# Patient Record
Sex: Female | Born: 1955 | Race: Black or African American | Hispanic: No | Marital: Married | Smoking: Former smoker
Health system: Southern US, Community
[De-identification: ages and names within clinical notes are randomized; demographics above are authoritative.]

## PROBLEM LIST (undated history)

## (undated) DIAGNOSIS — I4891 Unspecified atrial fibrillation: Secondary | ICD-10-CM

## (undated) DIAGNOSIS — R519 Headache, unspecified: Secondary | ICD-10-CM

## (undated) DIAGNOSIS — I639 Cerebral infarction, unspecified: Secondary | ICD-10-CM

## (undated) DIAGNOSIS — K5790 Diverticulosis of intestine, part unspecified, without perforation or abscess without bleeding: Secondary | ICD-10-CM

## (undated) DIAGNOSIS — K219 Gastro-esophageal reflux disease without esophagitis: Secondary | ICD-10-CM

## (undated) DIAGNOSIS — M1990 Unspecified osteoarthritis, unspecified site: Secondary | ICD-10-CM

## (undated) DIAGNOSIS — R42 Dizziness and giddiness: Secondary | ICD-10-CM

## (undated) DIAGNOSIS — E119 Type 2 diabetes mellitus without complications: Secondary | ICD-10-CM

## (undated) DIAGNOSIS — R011 Cardiac murmur, unspecified: Secondary | ICD-10-CM

## (undated) DIAGNOSIS — I700 Atherosclerosis of aorta: Secondary | ICD-10-CM

## (undated) DIAGNOSIS — I429 Cardiomyopathy, unspecified: Secondary | ICD-10-CM

## (undated) DIAGNOSIS — I509 Heart failure, unspecified: Secondary | ICD-10-CM

## (undated) DIAGNOSIS — R0600 Dyspnea, unspecified: Secondary | ICD-10-CM

## (undated) DIAGNOSIS — D649 Anemia, unspecified: Secondary | ICD-10-CM

## (undated) DIAGNOSIS — K279 Peptic ulcer, site unspecified, unspecified as acute or chronic, without hemorrhage or perforation: Secondary | ICD-10-CM

## (undated) DIAGNOSIS — K648 Other hemorrhoids: Secondary | ICD-10-CM

## (undated) DIAGNOSIS — I499 Cardiac arrhythmia, unspecified: Secondary | ICD-10-CM

## (undated) DIAGNOSIS — IMO0002 Reserved for concepts with insufficient information to code with codable children: Secondary | ICD-10-CM

## (undated) DIAGNOSIS — I10 Essential (primary) hypertension: Secondary | ICD-10-CM

## (undated) DIAGNOSIS — M329 Systemic lupus erythematosus, unspecified: Secondary | ICD-10-CM

## (undated) HISTORY — DX: Atherosclerosis of aorta: I70.0

## (undated) HISTORY — PX: COLONOSCOPY: SHX174

## (undated) HISTORY — DX: Dizziness and giddiness: R42

## (undated) HISTORY — DX: Peptic ulcer, site unspecified, unspecified as acute or chronic, without hemorrhage or perforation: K27.9

## (undated) HISTORY — DX: Diverticulosis of intestine, part unspecified, without perforation or abscess without bleeding: K57.90

## (undated) HISTORY — DX: Systemic lupus erythematosus, unspecified: M32.9

## (undated) HISTORY — PX: BACK SURGERY: SHX140

## (undated) HISTORY — PX: MENISECTOMY: SHX5181

## (undated) HISTORY — PX: UPPER GASTROINTESTINAL ENDOSCOPY: SHX188

## (undated) HISTORY — DX: Cardiomyopathy, unspecified: I42.9

## (undated) HISTORY — DX: Other hemorrhoids: K64.8

## (undated) HISTORY — PX: KNEE CARTILAGE SURGERY: SHX688

## (undated) HISTORY — DX: Reserved for concepts with insufficient information to code with codable children: IMO0002

## (undated) HISTORY — DX: Cerebral infarction, unspecified: I63.9

## (undated) HISTORY — PX: NECK SURGERY: SHX720

## (undated) HISTORY — PX: CARPAL TUNNEL RELEASE: SHX101

---

## 2010-07-21 LAB — HM COLONOSCOPY

## 2012-07-21 LAB — HM PAP SMEAR: HM PAP: NORMAL

## 2016-02-28 ENCOUNTER — Emergency Department (HOSPITAL_BASED_OUTPATIENT_CLINIC_OR_DEPARTMENT_OTHER): Payer: PPO

## 2016-02-28 ENCOUNTER — Encounter (HOSPITAL_BASED_OUTPATIENT_CLINIC_OR_DEPARTMENT_OTHER): Payer: Self-pay | Admitting: *Deleted

## 2016-02-28 ENCOUNTER — Emergency Department (HOSPITAL_BASED_OUTPATIENT_CLINIC_OR_DEPARTMENT_OTHER)
Admission: EM | Admit: 2016-02-28 | Discharge: 2016-02-28 | Disposition: A | Discharge status: Home / Self Care | Payer: PPO | Attending: Emergency Medicine | Admitting: Emergency Medicine

## 2016-02-28 DIAGNOSIS — Z87891 Personal history of nicotine dependence: Secondary | ICD-10-CM | POA: Diagnosis not present

## 2016-02-28 DIAGNOSIS — I10 Essential (primary) hypertension: Secondary | ICD-10-CM | POA: Diagnosis not present

## 2016-02-28 DIAGNOSIS — R51 Headache: Secondary | ICD-10-CM | POA: Diagnosis not present

## 2016-02-28 DIAGNOSIS — R079 Chest pain, unspecified: Secondary | ICD-10-CM | POA: Diagnosis not present

## 2016-02-28 DIAGNOSIS — H8111 Benign paroxysmal vertigo, right ear: Secondary | ICD-10-CM

## 2016-02-28 DIAGNOSIS — Z79899 Other long term (current) drug therapy: Secondary | ICD-10-CM | POA: Diagnosis not present

## 2016-02-28 DIAGNOSIS — R05 Cough: Secondary | ICD-10-CM | POA: Diagnosis not present

## 2016-02-28 DIAGNOSIS — H8110 Benign paroxysmal vertigo, unspecified ear: Secondary | ICD-10-CM | POA: Insufficient documentation

## 2016-02-28 DIAGNOSIS — Z7982 Long term (current) use of aspirin: Secondary | ICD-10-CM | POA: Diagnosis not present

## 2016-02-28 HISTORY — DX: Essential (primary) hypertension: I10

## 2016-02-28 LAB — CBC WITH DIFFERENTIAL/PLATELET
BASOS PCT: 0 %
Basophils Absolute: 0 10*3/uL (ref 0.0–0.1)
EOS ABS: 0.1 10*3/uL (ref 0.0–0.7)
EOS PCT: 1 %
HCT: 39.4 % (ref 36.0–46.0)
HEMOGLOBIN: 13.6 g/dL (ref 12.0–15.0)
LYMPHS ABS: 2.4 10*3/uL (ref 0.7–4.0)
Lymphocytes Relative: 24 %
MCH: 29.6 pg (ref 26.0–34.0)
MCHC: 34.5 g/dL (ref 30.0–36.0)
MCV: 85.7 fL (ref 78.0–100.0)
MONOS PCT: 8 %
Monocytes Absolute: 0.9 10*3/uL (ref 0.1–1.0)
NEUTROS PCT: 67 %
Neutro Abs: 6.7 10*3/uL (ref 1.7–7.7)
PLATELETS: 280 10*3/uL (ref 150–400)
RBC: 4.6 MIL/uL (ref 3.87–5.11)
RDW: 13.7 % (ref 11.5–15.5)
WBC: 10.1 10*3/uL (ref 4.0–10.5)

## 2016-02-28 LAB — BASIC METABOLIC PANEL
ANION GAP: 9 (ref 5–15)
BUN: 14 mg/dL (ref 6–20)
CALCIUM: 8.9 mg/dL (ref 8.9–10.3)
CHLORIDE: 101 mmol/L (ref 101–111)
CO2: 29 mmol/L (ref 22–32)
Creatinine, Ser: 0.85 mg/dL (ref 0.44–1.00)
GFR calc non Af Amer: >60 mL/min (ref >60)
Glucose, Bld: 134 mg/dL — ABNORMAL HIGH (ref 65–99)
Potassium: 3.2 mmol/L — ABNORMAL LOW (ref 3.5–5.1)
Sodium: 139 mmol/L (ref 135–145)

## 2016-02-28 LAB — TROPONIN I

## 2016-02-28 MED ORDER — MECLIZINE HCL 25 MG PO TABS
25.0000 mg | ORAL_TABLET | Freq: Once | ORAL | Status: AC
  Administered 2016-02-28: 25 mg via ORAL
  Filled 2016-02-28: qty 1

## 2016-02-28 MED ORDER — ACETAMINOPHEN 325 MG PO TABS
650.0000 mg | ORAL_TABLET | Freq: Once | ORAL | Status: AC
  Administered 2016-02-28: 650 mg via ORAL
  Filled 2016-02-28: qty 2

## 2016-02-28 MED ORDER — MECLIZINE HCL 25 MG PO TABS: 25.0000 mg | ORAL_TABLET | Freq: Three times a day (TID) | ORAL | 0 refills | Status: DC | PRN

## 2016-02-28 NOTE — ED Notes (Signed)
States she is having tingling in her both hands.

## 2016-02-28 NOTE — ED Provider Notes (Signed)
[facility] DEPT [facility] Provider Note   CSN: [id number] Arrival date & time: 02/28/16  1500  First Provider Contact:  None       History   Chief Complaint Chief Complaint  Patient presents with  . Chest Pain    HPI Amanda Saunders is a 60 y.o. female.  HPI Chest pain this am that was sharp and then she had numbness in her left arm. She went to [facility] and became disoriented. She is alert oriented on arrival.  Past Medical History:  Diagnosis Date  . Hypertension     There are no active problems to display for this patient.   Past Surgical History:  Procedure Laterality Date  . BACK SURGERY    . CARPAL TUNNEL RELEASE      OB History    No data available       Home Medications    Prior to Admission medications   Medication Sig Start Date End Date Taking? Authorizing Provider  Aspirin (ASPIR-81 PO) Take by mouth.   Yes Historical Provider, MD  HYDROCHLOROTHIAZIDE PO Take by mouth.   Yes Historical Provider, MD  meclizine (ANTIVERT) 25 MG tablet Take 1 tablet (25 mg total) by mouth 3 (three) times daily as needed for dizziness. 02/28/16   Leonard Schwartz, MD    Family History No family history on file.  Social History Social History  Substance Use Topics  . Smoking status: Former Research scientist (life sciences)  . Smokeless tobacco: Never Used  . Alcohol use No     Allergies   Review of patient's allergies indicates no known allergies.   Review of Systems Review of Systems All other systems reviewed and are negative  Physical Exam Updated Vital Signs BP 118/74 (BP Location: Right Arm)   Pulse 74   Temp 98.2 F (36.8 C) (Oral)   Resp 22   Ht 5\' 3"  (1.6 m)   Wt 194 lb (88 kg)   SpO2 98%   BMI 34.37 kg/m   Physical Exam  Physical Exam  Nursing note and vitals reviewed. Constitutional: She is oriented to person, place, and time. She appears well-developed and well-nourished. No distress.  HENT:  Head: Normocephalic and atraumatic.  Eyes: Pupils are equal,  round, and reactive to light.  Neck: Normal range of motion.  Cardiovascular: Normal rate and intact distal pulses.   Pulmonary/Chest: No respiratory distress.  Abdominal: Normal appearance. She exhibits no distension.  Musculoskeletal: Normal range of motion.  Neurological: She is alert and oriented to person, place, and time. No cranial nerve deficit.  Patient has reproducible vertigo when turning her head to the right.  Some nystagmus that quickly rose way.  She has no motor deficit.  No sensory deficit.   Skin: Skin is warm and dry. No rash noted.  Psychiatric: She has a normal mood and affect. Her behavior is normal.    ED Treatments / Results  Labs (all labs ordered are listed, but only abnormal results are displayed) Labs Reviewed  BASIC METABOLIC PANEL - Abnormal; Notable for the following:       Result Value   Potassium 3.2 (*)    Glucose, Bld 134 (*)    All other components within normal limits  CBC WITH DIFFERENTIAL/PLATELET  TROPONIN I    EKG  EKG Interpretation  Date/Time:  Thursday February 28 2016 15:09:31 EDT Ventricular Rate:  72 PR Interval:  170 QRS Duration: 86 QT Interval:  428 QTC Calculation: 468 R Axis:   28 Text Interpretation:  Normal sinus  rhythm Nonspecific T wave abnormality Prolonged QT Abnormal ECG No previous tracing Confirmed by Lynnea Vandervoort  MD, Kairi Tufo ([id number]) on 02/28/2016 3:27:00 PM       Radiology Dg Chest 2 View  Result Date: 02/28/2016 CLINICAL DATA:  Chest pain, dizziness, cough EXAM: CHEST  2 VIEW COMPARISON:  None. FINDINGS: The heart size and mediastinal contours are within normal limits. Both lungs are clear. The visualized skeletal structures are unremarkable. IMPRESSION: No active cardiopulmonary disease. Electronically Signed   By: Lahoma Crocker M.D.   On: 02/28/2016 15:52   Ct Head Wo Contrast  Result Date: 02/28/2016 CLINICAL DATA:  Headache for 2 weeks.  Dizziness today. EXAM: CT HEAD WITHOUT CONTRAST TECHNIQUE: Contiguous axial  images were obtained from the base of the skull through the vertex without intravenous contrast. COMPARISON:  None. FINDINGS: The ventricles normal in size and configuration. There are no parenchymal masses or mass effect. There is no evidence of an infarct. The patchy white matter hypoattenuation is noted consistent with moderate chronic microvascular ischemic change, advanced for age. There are no extra-axial masses or abnormal fluid collections. There is no intracranial hemorrhage. Visualized sinuses and mastoid air cells are clear. No skull lesion. IMPRESSION: 1. No acute intracranial abnormalities. 2. Moderate chronic microvascular ischemic change, advanced for age. No other abnormalities. Electronically Signed   By: Lajean Manes M.D.   On: 02/28/2016 16:44    Procedures Procedures (including critical care time)  Medications Ordered in ED Medications  acetaminophen (TYLENOL) tablet 650 mg (650 mg Oral Given 02/28/16 1655)  meclizine (ANTIVERT) tablet 25 mg (25 mg Oral Given 02/28/16 1729)     Initial Impression / Assessment and Plan / ED Course  I have reviewed the triage vital signs and the nursing notes.  Pertinent labs & imaging results that were available during my care of the patient were reviewed by me and considered in my medical decision making (see chart for details).  Clinical Course      Final Clinical Impressions(s) / ED Diagnoses   Final diagnoses:  BPV (benign positional vertigo), right    New Prescriptions Discharge Medication List as of 02/28/2016  5:26 PM    START taking these medications   Details  meclizine (ANTIVERT) 25 MG tablet Take 1 tablet (25 mg total) by mouth 3 (three) times daily as needed for dizziness., Starting Thu 02/28/2016, Print         Leonard Schwartz, MD 02/29/16 [phone]

## 2016-02-28 NOTE — ED Triage Notes (Signed)
Chest pain this am that was sharp and then she had numbness in her left arm. She went to [facility] and became disoriented. She is alert oriented on arrival.

## 2016-03-11 ENCOUNTER — Telehealth: Payer: Self-pay | Admitting: Behavioral Health

## 2016-03-11 ENCOUNTER — Encounter: Payer: Self-pay | Admitting: Behavioral Health

## 2016-03-11 NOTE — Telephone Encounter (Signed)
Pre-Visit Call completed with patient and chart updated.   Pre-Visit Info documented in Specialty Comments under [employer].    

## 2016-03-12 ENCOUNTER — Encounter: Payer: Self-pay | Admitting: Family Medicine

## 2016-03-12 ENCOUNTER — Ambulatory Visit (INDEPENDENT_AMBULATORY_CARE_PROVIDER_SITE_OTHER): Payer: PPO | Admitting: Family Medicine

## 2016-03-12 VITALS — BP 145/85 | HR 63 | Ht 63.25 in | Wt 203.4 lb

## 2016-03-12 DIAGNOSIS — Z23 Encounter for immunization: Secondary | ICD-10-CM | POA: Diagnosis not present

## 2016-03-12 DIAGNOSIS — Z1211 Encounter for screening for malignant neoplasm of colon: Secondary | ICD-10-CM

## 2016-03-12 DIAGNOSIS — E876 Hypokalemia: Secondary | ICD-10-CM

## 2016-03-12 DIAGNOSIS — R609 Edema, unspecified: Secondary | ICD-10-CM | POA: Diagnosis not present

## 2016-03-12 DIAGNOSIS — Z8673 Personal history of transient ischemic attack (TIA), and cerebral infarction without residual deficits: Secondary | ICD-10-CM | POA: Diagnosis not present

## 2016-03-12 DIAGNOSIS — R079 Chest pain, unspecified: Secondary | ICD-10-CM

## 2016-03-12 LAB — BASIC METABOLIC PANEL
BUN: 11 mg/dL (ref 6–23)
CHLORIDE: 103 meq/L (ref 96–112)
CO2: 32 meq/L (ref 19–32)
CREATININE: 0.79 mg/dL (ref 0.40–1.20)
Calcium: 9.3 mg/dL (ref 8.4–10.5)
GFR: 95.81 mL/min (ref >60.00)
Glucose, Bld: 110 mg/dL — ABNORMAL HIGH (ref 70–99)
POTASSIUM: 4.3 meq/L (ref 3.5–5.1)
Sodium: 139 mEq/L (ref 135–145)

## 2016-03-12 NOTE — Progress Notes (Signed)
[facility] at [facility] [address] [place], [place] [address] [phone]  Date:  03/12/2016   Name:  Amanda Saunders   DOB:  [birth year]   MRN:  [id number]  PCP:  Lamar Blinks, MD    Chief Complaint: Establish Care (Pt states that she has sob, headaches, dizziness, disorientation, chest pain and numbness, lower back pain and numbness in right leg. No cartilage in in knees)   History of Present Illness:  Amanda Saunders is a 60 y.o. very pleasant female patient who presents with the following:  Here today as a new patient- she was seen in the ER on 8/10 and dx with BPPV; she had presented with complaint of chest pain and left arm numbness, [facility] and became [facility]. She had a negative eval including troponin, CT head and was released to home She recently moved to [facility] from [place]. She is a reitred/ disabled former Gaffer.  States that she became disabled several years ago due to hand (?CTS) and back problems, then had a stroke just 2 years ago  Here today with complaint of several persistent sx as below, and also wishes to establish care  She had a CVA in 2015; states that she was seated in a car and suddenly felt her face "drop," she had CP and her arm was numb. They went to the ER and she was admitted, she was inpt for 5 days. She does not know more details about the stroke. She was released home with asa 81 mg.    She notes that she will sometimes feel disoriented since her CVA occurred.  She will also feel chest pains, head pains and her hands will go numb.    She did see cardiology in [place]- she was told that her heart looked ok. Cardiomyopathy is on her problem list but she is not sure about this.  Reports that she has noted intermittent CP for maybe 3 years, they may occur about 6x a year.  Tend to be worse when she is feeling worried about something.  No futrher CP since she was in the ER recently per  her report.  She reports that she has SOB "all the time, can be with walking or going up stairs." she has noted this for the last 4 years.    She used to smoke heavily but she quit in 2006.    She was also seeing a neurologist in [place]- states that her doctor wanted her to "take some sort of test," but she moved here before this could be done.  She is not on any medications per her neurologist.    She did have a low K level in the ER; we will recheck this today.   States that she is on HCTZ for swelling, not for HTN.  She takes 25 every other day; she has been on this for about 3 years.    Also notes that at her last colonoscopy in 2012, she had polyps and was supposed to have a repeat she thinks in 5 years   No recollection of receiving a tetanus shot as an adult She was given meclizine for dizziness at the ER and this did seem to help her some.   There are no active problems to display for this patient.   Past Medical History:  Diagnosis Date  . Cardiomyopathy ([place])   . Hypertension   . Stroke ([place])   . Vertigo  Past Surgical History:  Procedure Laterality Date  . BACK SURGERY    . CARPAL TUNNEL RELEASE      Social History  Substance Use Topics  . Smoking status: Former Research scientist (life sciences)  . Smokeless tobacco: Never Used  . Alcohol use No    Family History  Problem Relation Age of Onset  . Hypertension Mother   . Arthritis Mother   . Kidney disease Father   . Hypertension Father     Allergies  Allergen Reactions  . Codeine Nausea And Vomiting    Medication list has been reviewed and updated.  Current Outpatient Prescriptions on File Prior to Visit  Medication Sig Dispense Refill  . Aspirin (ASPIR-81 PO) Take by mouth.    Marland Kitchen HYDROCHLOROTHIAZIDE PO Take by mouth.    . meclizine (ANTIVERT) 25 MG tablet Take 1 tablet (25 mg total) by mouth 3 (three) times daily as needed for dizziness. 30 tablet 0   No current facility-administered medications on file prior to visit.      Review of Systems:  As per HPI- otherwise negative.   Physical Examination:  Orthostatic VS for the past 24 hrs:  BP- Lying Pulse- Lying BP- Sitting Pulse- Sitting BP- Standing at 0 minutes Pulse- Standing at 0 minutes  03/12/16 1049 139/84 65 145/85 63 151/90 71     Vitals:   03/12/16 1040  Weight: 203 lb 6.4 oz (92.3 kg)  Height: 5' 3.25" (1.607 m)   Body mass index is 35.75 kg/m. Ideal Body Weight: Weight in (lb) to have BMI = 25: 142  GEN: WDWN, NAD, Non-toxic, A & O x 3, overweight, looks well HEENT: Atraumatic, Normocephalic. Neck supple. No masses, No LAD.  Bilateral TM wnl, oropharynx normal.  PEERL,EOMI.   Ears and Nose: No external deformity. CV: RRR, No M/G/R. No JVD. No thrill. No extra heart sounds. PULM: CTA B, no wheezes, crackles, rhonchi. No retractions. No resp. distress. No accessory muscle use. ABD: S, NT, ND. No rebound. No HSM. EXTR: No c/c/e NEURO Normal gait. No apparent residual sx from her stroke. Normal strength, DTR of all extremities.  She does feel that her light touch sensation is diminished on the right  PSYCH: Normally interactive. Conversant. Not depressed or anxious appearing.  Calm demeanor.   EKG: compared with tracing from 8/10; she has a non- specific T wave change but no concerning ST elevation or depression. SR  Assessment and Plan: History of CVA (cerebrovascular accident) - Plan: Ambulatory referral to Neurology  Chest pain at rest - Plan: Ambulatory referral to Cardiology  Peripheral edema - Plan: Basic metabolic panel  Hypokalemia - Plan: Basic metabolic panel  Screening for colon cancer - Plan: Ambulatory referral to Gastroenterology  Immunization due - Plan: Tdap vaccine greater than or equal to 7yo IM  Here today to establish care.  She has several concerning symptoms, but all of these are longstanding Will refer to neurology and cardiology to establish care, and she is due for a colonoscopy Noted hypokalemia  recently and will recheck for her today tdap updated  It was nice to meet you today.  I'll get you set up to see neurology and also cardiology.  I will also check your potassium today as your level was a bit low the other day in the ER. I will also refer you to gastroenterology to update your colonoscopy   If you have any change or worsening of your symptoms let me know. Otherwise let's plan to meet again in 4-6 months  to see how you are doing  With labs will ask pt to contact her previous doctors and have them send me records at [place], [place]

## 2016-03-12 NOTE — Progress Notes (Signed)
Pre visit review using our clinic review tool, if applicable. No additional management support is needed unless otherwise documented below in the visit note. 

## 2016-03-12 NOTE — Patient Instructions (Addendum)
It was nice to meet you today.  I'll get you set up to see neurology and also cardiology.  I will also check your potassium today as your level was a bit low the other day in the ER. I will also refer you to gastroenterology to update your colonoscopy   If you have any change or worsening of your symptoms let me know. Otherwise let's plan to meet again in 4-6 months to see how you are doing

## 2016-03-25 ENCOUNTER — Telehealth: Payer: Self-pay | Admitting: Gastroenterology

## 2016-03-26 ENCOUNTER — Encounter: Payer: Self-pay | Admitting: Family Medicine

## 2016-03-26 ENCOUNTER — Emergency Department (HOSPITAL_COMMUNITY): Payer: PPO

## 2016-03-26 ENCOUNTER — Encounter (HOSPITAL_BASED_OUTPATIENT_CLINIC_OR_DEPARTMENT_OTHER): Payer: Self-pay | Admitting: *Deleted

## 2016-03-26 ENCOUNTER — Emergency Department (HOSPITAL_BASED_OUTPATIENT_CLINIC_OR_DEPARTMENT_OTHER): Payer: PPO

## 2016-03-26 ENCOUNTER — Emergency Department (HOSPITAL_BASED_OUTPATIENT_CLINIC_OR_DEPARTMENT_OTHER)
Admission: EM | Admit: 2016-03-26 | Discharge: 2016-03-26 | Disposition: A | Discharge status: Home / Self Care | Payer: PPO | Attending: Emergency Medicine | Admitting: Emergency Medicine

## 2016-03-26 ENCOUNTER — Ambulatory Visit (INDEPENDENT_AMBULATORY_CARE_PROVIDER_SITE_OTHER): Payer: PPO | Admitting: Family Medicine

## 2016-03-26 VITALS — BP 124/86 | HR 82 | Temp 99.0°F | Ht 63.0 in | Wt 200.6 lb

## 2016-03-26 DIAGNOSIS — Z8673 Personal history of transient ischemic attack (TIA), and cerebral infarction without residual deficits: Secondary | ICD-10-CM | POA: Insufficient documentation

## 2016-03-26 DIAGNOSIS — R200 Anesthesia of skin: Secondary | ICD-10-CM | POA: Insufficient documentation

## 2016-03-26 DIAGNOSIS — Z79899 Other long term (current) drug therapy: Secondary | ICD-10-CM | POA: Insufficient documentation

## 2016-03-26 DIAGNOSIS — Z87891 Personal history of nicotine dependence: Secondary | ICD-10-CM | POA: Insufficient documentation

## 2016-03-26 DIAGNOSIS — R0789 Other chest pain: Secondary | ICD-10-CM | POA: Diagnosis not present

## 2016-03-26 DIAGNOSIS — R51 Headache: Secondary | ICD-10-CM | POA: Diagnosis not present

## 2016-03-26 DIAGNOSIS — R079 Chest pain, unspecified: Secondary | ICD-10-CM

## 2016-03-26 DIAGNOSIS — R5383 Other fatigue: Secondary | ICD-10-CM | POA: Diagnosis not present

## 2016-03-26 DIAGNOSIS — I10 Essential (primary) hypertension: Secondary | ICD-10-CM | POA: Diagnosis not present

## 2016-03-26 LAB — CBC WITH DIFFERENTIAL/PLATELET
BASOS PCT: 0 %
Basophils Absolute: 0 10*3/uL (ref 0.0–0.1)
Eosinophils Absolute: 0.1 10*3/uL (ref 0.0–0.7)
Eosinophils Relative: 1 %
HEMATOCRIT: 39.9 % (ref 36.0–46.0)
HEMOGLOBIN: 13.6 g/dL (ref 12.0–15.0)
LYMPHS ABS: 2.6 10*3/uL (ref 0.7–4.0)
LYMPHS PCT: 25 %
MCH: 29.6 pg (ref 26.0–34.0)
MCHC: 34.1 g/dL (ref 30.0–36.0)
MCV: 86.7 fL (ref 78.0–100.0)
MONO ABS: 0.9 10*3/uL (ref 0.1–1.0)
MONOS PCT: 8 %
NEUTROS ABS: 6.6 10*3/uL (ref 1.7–7.7)
NEUTROS PCT: 66 %
Platelets: 292 10*3/uL (ref 150–400)
RBC: 4.6 MIL/uL (ref 3.87–5.11)
RDW: 14.1 % (ref 11.5–15.5)
WBC: 10.2 10*3/uL (ref 4.0–10.5)

## 2016-03-26 LAB — BASIC METABOLIC PANEL
ANION GAP: 8 (ref 5–15)
BUN: 14 mg/dL (ref 6–20)
CALCIUM: 8.9 mg/dL (ref 8.9–10.3)
CHLORIDE: 100 mmol/L — AB (ref 101–111)
CO2: 28 mmol/L (ref 22–32)
Creatinine, Ser: 0.78 mg/dL (ref 0.44–1.00)
GFR calc non Af Amer: >60 mL/min (ref >60)
GLUCOSE: 101 mg/dL — AB (ref 65–99)
POTASSIUM: 3.4 mmol/L — AB (ref 3.5–5.1)
Sodium: 136 mmol/L (ref 135–145)

## 2016-03-26 LAB — TROPONIN I: Troponin I: <0.03 ng/mL (ref <0.03)

## 2016-03-26 LAB — I-STAT TROPONIN, ED: TROPONIN I, POC: 0.01 ng/mL (ref 0.00–0.08)

## 2016-03-26 MED ORDER — MIDAZOLAM HCL 2 MG/2ML IJ SOLN
1.0000 mg | Freq: Once | INTRAMUSCULAR | Status: AC
  Administered 2016-03-26: 1 mg via INTRAVENOUS
  Filled 2016-03-26: qty 2

## 2016-03-26 MED ORDER — HYDROCODONE-ACETAMINOPHEN 5-325 MG PO TABS
2.0000 | ORAL_TABLET | Freq: Once | ORAL | Status: AC
  Administered 2016-03-26: 2 via ORAL
  Filled 2016-03-26: qty 2

## 2016-03-26 MED ORDER — KETOROLAC TROMETHAMINE 30 MG/ML IJ SOLN
30.0000 mg | Freq: Once | INTRAMUSCULAR | Status: AC
  Administered 2016-03-26: 30 mg via INTRAVENOUS
  Filled 2016-03-26: qty 1

## 2016-03-26 MED ORDER — LORAZEPAM 2 MG/ML IJ SOLN
1.0000 mg | Freq: Once | INTRAMUSCULAR | Status: AC
Start: 2016-03-26 — End: 2016-03-26
  Administered 2016-03-26: 1 mg via INTRAVENOUS
  Filled 2016-03-26: qty 1

## 2016-03-26 NOTE — ED Triage Notes (Signed)
Pt reports numbness to her left arm and leg x last night, with intermittent sudden, sharp, quick chest pains "like an electric shock" that come and go. ekg performed during triage, iv access obtained during triage.

## 2016-03-26 NOTE — ED Notes (Signed)
MD at bedside. 

## 2016-03-26 NOTE — Progress Notes (Signed)
Pre visit review using our clinic review tool, if applicable. No additional management support is needed unless otherwise documented below in the visit note. 

## 2016-03-26 NOTE — ED Notes (Signed)
Pt c/o HA and requesting pain meds, MD notified.

## 2016-03-26 NOTE — ED Provider Notes (Signed)
Patient transferred from [place] after she presented today with left arm and left leg numbness. For full details of presentation today, please see prior note from Dr. Stark Jock. Briefly, this is a 60 year old female with history of cardiomyopathy, CVA (no reported residual deficits), HTN, and vertigo who presented today with intermittent sharp chest pain and LUE/LLE numbness. Initial EKG and troponin not concerning for ACS. CT head was negative. However, due to concern for CVA, sent here for MRI.  Upon arrival here, patient is afebrile and with stable vital signs. Neuro exam normal except for numbness in left hand and part of forearm; the rest of her LUE and LLE numbness has resolved. Obtained second troponin, which remained negative. Patient is without current chest pain. Doubt ischemia with anginal symptoms leading to presentation today.   MRI obtained. Negative for CVA. On reevaluation, patient states she only has a slight residual numbness in the tip of her thumb. Otherwise, she is feeling well. She has follow-up with her PCP, cardiology, and neurology already scheduled. Discharged in stable condition with return precautions.  Case discussed with Dr. Venora Maples, who oversaw management of this patient.    Ivin Booty, MD 03/26/16 [id number]    Jola Schmidt, MD 03/27/16 [phone]

## 2016-03-26 NOTE — ED Notes (Signed)
[facility] has arrived to transport patient to [facility] ED.

## 2016-03-26 NOTE — ED Notes (Signed)
Family updated on plan of care for patient.

## 2016-03-26 NOTE — ED Provider Notes (Signed)
[facility] DEPT [facility] Provider Note   CSN: [id number] Arrival date & time: 03/26/16  1130     History   Chief Complaint Chief Complaint  Patient presents with  . Numbness    HPI Amanda Saunders is a 60 y.o. female.  Patient is a 60 year old female with past medical history of prior CVA and peripheral edema. She presents for evaluation of numbness. She reports feeling weak and fatigued for the past 3 days. Yesterday evening she experienced one episode of sharp pain in the center of her chest that lasted for a brief period of time, then resolved. She reports feeling very weak afterward and developed a headache with left arm and leg numbness. She denies any shortness of breath, nausea, or diaphoresis. She denies any recent exertional symptoms.  She tells me she was diagnosed with a prior stroke in [place] 2 years ago. She recently relocated here approximately 4 months ago. She had a doctor's appointment scheduled for this morning and was sent here from the [facility] [facility] clinic upstairs for further evaluation.   The history is provided by the patient.    Past Medical History:  Diagnosis Date  . Cardiomyopathy ([place])   . Hypertension   . Stroke ([place])   . Vertigo     Patient Active Problem List   Diagnosis Date Noted  . History of CVA (cerebrovascular accident) 03/12/2016  . Chest pain at rest 03/12/2016  . Peripheral edema 03/12/2016    Past Surgical History:  Procedure Laterality Date  . BACK SURGERY    . CARPAL TUNNEL RELEASE      OB History    No data available       Home Medications    Prior to Admission medications   Medication Sig Start Date End Date Taking? Authorizing Provider  aspirin 81 MG tablet Take 81 mg by mouth daily.    Historical Provider, MD  hydrochlorothiazide (HYDRODIURIL) 25 MG tablet Take 25 mg by mouth every other day.    Historical Provider, MD  meclizine (ANTIVERT) 25 MG tablet Take 1 tablet (25 mg total) by mouth 3 (three) times daily  as needed for dizziness. 02/28/16   Leonard Schwartz, MD  Pyridoxine HCl (VITAMIN B-6) 500 MG tablet Take 500 mg by mouth every other day.     Historical Provider, MD  vitamin B-12 (CYANOCOBALAMIN) 500 MCG tablet Take 500 mcg by mouth every other day.     Historical Provider, MD    Family History Family History  Problem Relation Age of Onset  . Hypertension Mother   . Arthritis Mother   . Kidney disease Father   . Hypertension Father     Social History Social History  Substance Use Topics  . Smoking status: Former Smoker    Quit date: 07/21/2002  . Smokeless tobacco: Never Used  . Alcohol use No     Allergies   Codeine   Review of Systems Review of Systems  All other systems reviewed and are negative.    Physical Exam Updated Vital Signs BP 133/63 (BP Location: Right Arm)   Pulse 66   Resp 19   SpO2 99%   Physical Exam  Constitutional: She is oriented to person, place, and time. She appears well-developed and well-nourished. No distress.  HENT:  Head: Normocephalic and atraumatic.  Eyes: EOM are normal. Pupils are equal, round, and reactive to light.  Neck: Normal range of motion. Neck supple.  Cardiovascular: Normal rate and regular rhythm.  Exam reveals no gallop and no  friction rub.   No murmur heard. Pulmonary/Chest: Effort normal and breath sounds normal. No respiratory distress. She has no wheezes.  Abdominal: Soft. Bowel sounds are normal. She exhibits no distension. There is no tenderness.  Musculoskeletal: Normal range of motion.  Neurological: She is alert and oriented to person, place, and time. No cranial nerve deficit. She exhibits normal muscle tone. Coordination normal.  Skin: Skin is warm and dry. She is not diaphoretic.  Nursing note and vitals reviewed.    ED Treatments / Results  Labs (all labs ordered are listed, but only abnormal results are displayed) Labs Reviewed  BASIC METABOLIC PANEL  CBC WITH DIFFERENTIAL/PLATELET  TROPONIN I     EKG  EKG Interpretation  Date/Time:  Wednesday March 26 2016 11:40:51 EDT Ventricular Rate:  67 PR Interval:    QRS Duration: 92 QT Interval:  428 QTC Calculation: 452 R Axis:   -10 Text Interpretation:  Sinus rhythm Left ventricular hypertrophy Borderline T abnormalities, lateral leads Confirmed by Michiel Sivley  MD, Haeli Gerlich ([id number]) on 03/26/2016 11:44:17 AM       Radiology No results found.  Procedures Procedures (including critical care time)  Medications Ordered in ED Medications - No data to display   Initial Impression / Assessment and Plan / ED Course  I have reviewed the triage vital signs and the nursing notes.  Pertinent labs & imaging results that were available during my care of the patient were reviewed by me and considered in my medical decision making (see chart for details).  Clinical Course    CT scan reveals chronic ischemic white matter changes, however no acute stroke. She continues to report numbness in her left arm and left leg. I have discussed this with Dr.Shikhman who is recommending an MRI of the brain to fully rule out stroke. I've also spoken with Dr. Ashok Cordia from the emergency department who excepts in transfer.  Final Clinical Impressions(s) / ED Diagnoses   Final diagnoses:  None    New Prescriptions New Prescriptions   No medications on file     Veryl Speak, MD 03/26/16 1349

## 2016-03-26 NOTE — Patient Instructions (Signed)
Please proceed to the ER downstairs for further evaluation.  We are glad to reschedule your physical at your convenience

## 2016-03-26 NOTE — ED Notes (Signed)
Patient and family given a snack and drink per patient request and ok per MD. Patient resting, lights dimmed.

## 2016-03-26 NOTE — ED Notes (Signed)
Patient A&Ox4 and in NAD. Patient denies any needs at time of discharge. Husband and granddaughter at bedside at time of transport.

## 2016-03-26 NOTE — ED Notes (Signed)
Mri called and stated patient needed pain medication, er md notified and instructed to try lorazepam first and if the patient does not feel like she can do the exam to then try 1 mg of versed, 1 mg of lorazepam given at 2015 at 2020 patient still felt anxious so 1 mg of versed iv given, patient then stated she felt calm to do mri, this rn will stay at [facility] to monitor patient.

## 2016-03-26 NOTE — Progress Notes (Signed)
[place] at [facility] [address] [place], [place] [address] [id number] [phone]  Date:  03/26/2016   Name:  Amanda Saunders   DOB:  1956/01/26   MRN:  [id number]  PCP:  Lamar Blinks, MD    Chief Complaint: Annual Exam (Pt is not fasting. Due for PAP and mammogram. Need refill on HCTZ. Would like to discuss weight loss medicaiton. Pt request Hep C screening.)   History of Present Illness:  Amanda Saunders is a 60 y.o. very pleasant female patient who presents with the following:  See note from 8/23- history of CVA in 2015.  She has a history of chest pain, but has never had an MI or CHF per her knowledge. Here today for a CPE; however will have to reschedule this as she has complaint of numbness and chest pain as below. She is NOT fasting for labs today.  Last had a BMP on 8/23  Last pap was about 3 years ago- never had an abnormal. Would like to do this at her CPE  Today is Wednesday.  She reports that on Monday she felt very tired and slept all day.  Yesterday she noted a tingling and numb feeling in her left arm and left leg.  This is better now but not resolved. It "feels like there is a rubber band around" her left thumb.  Yesterday evening- while at rest- she noted onset of a sudden chest pain. It felt sharp- she took 2 aspirin (325mg  x2 tabs). She also noted a pain in her right face and head. She felt "uneasy on my feet."  She rested for about an hour and then went to bed. She notes that she still feels a heavy feeling in her chest- "like something sitting on my chest."   She felt SOB last night- this is now better, but she does hve complaint of baseline SOB with activity especially for the last several years.  She has also noted intermittent CP for the last few years which is why I had referred her to cardiology at last visit Former smoker.   At her last visit we did a cardiology referral for cardiology but she has not seen them  yet.  She has an appt later on this month 9/25 with Dr. Stanford Breed  Patient Active Problem List   Diagnosis Date Noted  . History of CVA (cerebrovascular accident) 03/12/2016  . Chest pain at rest 03/12/2016  . Peripheral edema 03/12/2016    Past Medical History:  Diagnosis Date  . Cardiomyopathy ([place])   . Hypertension   . Stroke ([place])   . Vertigo     Past Surgical History:  Procedure Laterality Date  . BACK SURGERY    . CARPAL TUNNEL RELEASE      Social History  Substance Use Topics  . Smoking status: Former Smoker    Quit date: 07/21/2002  . Smokeless tobacco: Never Used  . Alcohol use No    Family History  Problem Relation Age of Onset  . Hypertension Mother   . Arthritis Mother   . Kidney disease Father   . Hypertension Father     Allergies  Allergen Reactions  . Codeine Nausea And Vomiting    Medication list has been reviewed and updated.  Current Outpatient Prescriptions on File Prior to Visit  Medication Sig Dispense Refill  . aspirin 81 MG tablet Take 81 mg by mouth daily.    . hydrochlorothiazide (HYDRODIURIL) 25 MG tablet  Take 25 mg by mouth every other day.    . meclizine (ANTIVERT) 25 MG tablet Take 1 tablet (25 mg total) by mouth 3 (three) times daily as needed for dizziness. 30 tablet 0  . Pyridoxine HCl (VITAMIN B-6) 500 MG tablet Take 500 mg by mouth every other day.     . vitamin B-12 (CYANOCOBALAMIN) 500 MCG tablet Take 500 mcg by mouth every other day.      No current facility-administered medications on file prior to visit.     Review of Systems:  As per HPI- otherwise negative.   Physical Examination: Vitals:   03/26/16 1051  BP: 124/86  Pulse: 82  Temp: 99 F (37.2 C)   Vitals:   03/26/16 1051  Weight: 200 lb 9.6 oz (91 kg)  Height: 5\' 3"  (1.6 m)   Body mass index is 35.53 kg/m. Ideal Body Weight: Weight in (lb) to have BMI = 25: 140.8  GEN: WDWN, NAD, Non-toxic, A & O x 3, obese, otherwise looks well HEENT: Atraumatic,  Normocephalic. Neck supple. No masses, No LAD. Ears and Nose: No external deformity. CV: RRR, No M/G/R. No JVD. No thrill. No extra heart sounds. PULM: CTA B, no wheezes, crackles, rhonchi. No retractions. No resp. distress. No accessory muscle use. I am able to cause CP by pressing on her chest wall ABD: S, NT, ND, +BS. No rebound. No HSM. EXTR: No c/c/e NEURO Normal gait.  Normal strength and DTR of all extremities, normal facial movement.  Pt notes that her left arm feels partially numb to my touch. Tested romberg- pt has an abnormal result, stepped to avoid falling However she states that the last time her neurologist did the same test she had the same reaction PSYCH: Normally interactive. Conversant. Not depressed or anxious appearing.  Calm demeanor.   EKG: SR with non- specific T wave changes. No STEMI  Assessment and Plan: Chest pain, unspecified chest pain type - Plan: EKG 12-Lead  Numbness on left side   Here today to have a CPE, but we will need to reschedule this due to active chest pain. Suspect pain is MSK, but pt does have risk factor including history of smoking, HTN, history of stroke and obesity.   Will refer to ER for chest pain evaluation. Appreciate ER care of this patient.   She also has complaint of vague numbness on her left side yesterday, improved now. Known history of CVA.  No weakness or slurred speech on exam. May want to consider imaging of head in ER as well  Signed Lamar Blinks, MD

## 2016-03-26 NOTE — ED Notes (Signed)
Returned from [employer] and placed back on heart monitor, will continue to monitor

## 2016-03-27 NOTE — Telephone Encounter (Signed)
Dr. Fuller Plan reviewed records and has accepted patient. Colon due 08/2021. Recall Colon  has been entered. Patient states that she is not having any problems and I informed her that our office will contact her when time to schedule and to call our office if she needs [place].

## 2016-03-31 ENCOUNTER — Encounter: Payer: Self-pay | Admitting: *Deleted

## 2016-04-02 ENCOUNTER — Ambulatory Visit: Payer: Self-pay | Admitting: Family Medicine

## 2016-04-02 ENCOUNTER — Other Ambulatory Visit (HOSPITAL_COMMUNITY)
Admission: RE | Admit: 2016-04-02 | Discharge: 2016-04-02 | Disposition: A | Discharge status: Home / Self Care | Payer: PPO | Source: Ambulatory Visit | Attending: Family Medicine | Admitting: Family Medicine

## 2016-04-02 ENCOUNTER — Encounter: Payer: Self-pay | Admitting: Family Medicine

## 2016-04-02 ENCOUNTER — Ambulatory Visit (INDEPENDENT_AMBULATORY_CARE_PROVIDER_SITE_OTHER): Payer: PPO | Admitting: Family Medicine

## 2016-04-02 ENCOUNTER — Other Ambulatory Visit: Payer: Self-pay | Admitting: Family Medicine

## 2016-04-02 VITALS — BP 118/82 | HR 85 | Temp 97.9°F | Ht 63.0 in | Wt 201.8 lb

## 2016-04-02 DIAGNOSIS — Z1321 Encounter for screening for nutritional disorder: Secondary | ICD-10-CM

## 2016-04-02 DIAGNOSIS — Z124 Encounter for screening for malignant neoplasm of cervix: Secondary | ICD-10-CM | POA: Diagnosis not present

## 2016-04-02 DIAGNOSIS — Z131 Encounter for screening for diabetes mellitus: Secondary | ICD-10-CM

## 2016-04-02 DIAGNOSIS — Z1231 Encounter for screening mammogram for malignant neoplasm of breast: Secondary | ICD-10-CM

## 2016-04-02 DIAGNOSIS — L729 Follicular cyst of the skin and subcutaneous tissue, unspecified: Secondary | ICD-10-CM

## 2016-04-02 DIAGNOSIS — Z119 Encounter for screening for infectious and parasitic diseases, unspecified: Secondary | ICD-10-CM | POA: Diagnosis not present

## 2016-04-02 DIAGNOSIS — Z5181 Encounter for therapeutic drug level monitoring: Secondary | ICD-10-CM | POA: Diagnosis not present

## 2016-04-02 DIAGNOSIS — E559 Vitamin D deficiency, unspecified: Secondary | ICD-10-CM

## 2016-04-02 DIAGNOSIS — Z0000 Encounter for general adult medical examination without abnormal findings: Secondary | ICD-10-CM | POA: Diagnosis not present

## 2016-04-02 DIAGNOSIS — R7303 Prediabetes: Secondary | ICD-10-CM

## 2016-04-02 DIAGNOSIS — Z1329 Encounter for screening for other suspected endocrine disorder: Secondary | ICD-10-CM | POA: Diagnosis not present

## 2016-04-02 DIAGNOSIS — E669 Obesity, unspecified: Secondary | ICD-10-CM

## 2016-04-02 LAB — TSH: TSH: 2.37 u[IU]/mL (ref 0.35–4.50)

## 2016-04-02 LAB — HEPATIC FUNCTION PANEL
ALK PHOS: 77 U/L (ref 39–117)
ALT: 42 U/L — AB (ref 0–35)
AST: 26 U/L (ref 0–37)
Albumin: 4.2 g/dL (ref 3.5–5.2)
BILIRUBIN TOTAL: 0.3 mg/dL (ref 0.2–1.2)
Bilirubin, Direct: 0 mg/dL (ref 0.0–0.3)
Total Protein: 7.5 g/dL (ref 6.0–8.3)

## 2016-04-02 LAB — VITAMIN D 25 HYDROXY (VIT D DEFICIENCY, FRACTURES): VITD: 25.88 ng/mL — ABNORMAL LOW (ref 30.00–100.00)

## 2016-04-02 LAB — HEMOGLOBIN A1C: HEMOGLOBIN A1C: 6.1 % (ref 4.6–6.5)

## 2016-04-02 MED ORDER — LORCASERIN HCL 10 MG PO TABS: ORAL_TABLET | ORAL | 3 refills | Status: DC

## 2016-04-02 NOTE — Addendum Note (Signed)
Addended by: Lamar Blinks C on: 04/02/2016 01:08 PM   Modules accepted: Orders

## 2016-04-02 NOTE — Patient Instructions (Addendum)
It was very nice to see you today- I'll be in touch with your labs asap Please schedule a mammogram- you can do this right here at the [facility], phone number [phone], or stop by today and schedule on the way out We will also schedule an ultrasound to check out the area you have noticed in your right lower quadrant  Start on Belviq for weight loss- let me know if you have any problems with this medication, and let's plan to recheck here in 3 months

## 2016-04-02 NOTE — Progress Notes (Addendum)
[place] at [facility] [address], [place], [place] [address] [id number] [phone]  Date:  04/02/2016   Name:  Amanda Saunders   DOB:  [birth year]   MRN:  [id number]  PCP:  Lamar Blinks, MD    Chief Complaint: Annual Exam (Pt here for PAP, hep c screening. Would like to discuss weight management, interested in med named Adipex.)   History of Present Illness:  Amanda Saunders is a 60 y.o. very pleasant female patient who presents with the following:  I saw this pt a week ago but we had to refer her to the ER with chest pain and numbness of her left arm and leg.  She had eval for CVA- negative MRI and negative cardiac enzymes.  She was released to home and these sx have resolved.    CPE today She is NOT fasting for labs  Last pap about 3 years ago. Never had any cancerous changes.  Her last period was 4 years ago- no post- menopausal bleeding.  Last mammo: about 8 years ago Never had a bone density test as of yet  She is concerned about her weight and wonders about weight loss medications.  She asks specifically about phentermine  Declines a flu shot today  She has noted a firm area in her RLQ for about 3 months and would like to try and find out what is causing it. She notes that she may occasionally have nausea and vomiting but this is long-standing and may occur if she eats something that does not agree with her.  No recent change in this sx  Declines a flu shot today  Wt Readings from Last 3 Encounters:  04/02/16 201 lb 12.8 oz (91.5 kg)  03/26/16 200 lb 9.6 oz (91 kg)  03/12/16 203 lb 6.4 oz (92.3 kg)   BP Readings from Last 3 Encounters:  04/02/16 118/82  03/26/16 120/69  03/26/16 124/86   ### we do not think this pt actually has cardiomyopathy; not sure why this is on her list.  Will leave until she sees cardiology  Patient Active Problem List   Diagnosis Date Noted  . History of CVA (cerebrovascular accident)  03/12/2016  . Chest pain at rest 03/12/2016  . Peripheral edema 03/12/2016    Past Medical History:  Diagnosis Date  . Cardiomyopathy ([place])   . Hypertension   . Stroke ([place])   . Vertigo     Past Surgical History:  Procedure Laterality Date  . BACK SURGERY    . CARPAL TUNNEL RELEASE      Social History  Substance Use Topics  . Smoking status: Former Smoker    Quit date: 07/21/2002  . Smokeless tobacco: Never Used  . Alcohol use No    Family History  Problem Relation Age of Onset  . Hypertension Mother   . Arthritis Mother   . Kidney disease Father   . Hypertension Father     Allergies  Allergen Reactions  . Codeine Nausea And Vomiting    Medication list has been reviewed and updated.  Current Outpatient Prescriptions on File Prior to Visit  Medication Sig Dispense Refill  . aspirin 325 MG EC tablet Take 650 mg by mouth daily as needed (for chest pain episodes).     Marland Kitchen aspirin 81 MG tablet Take 81 mg by mouth daily.    . hydrochlorothiazide (HYDRODIURIL) 25 MG tablet Take 25 mg by mouth every other day.    . ibuprofen (  ADVIL,MOTRIN) 200 MG tablet Take 800 mg by mouth every 6 (six) hours as needed (for back pain).    . meclizine (ANTIVERT) 25 MG tablet Take 1 tablet (25 mg total) by mouth 3 (three) times daily as needed for dizziness. 30 tablet 0  . Pyridoxine HCl (VITAMIN B-6) 500 MG tablet Take 500 mg by mouth every other day.     . vitamin B-12 (CYANOCOBALAMIN) 500 MCG tablet Take 500 mcg by mouth every other day.      No current facility-administered medications on file prior to visit.     Review of Systems:  As per HPI- otherwise negative.   Physical Examination: Vitals:   04/02/16 1113  BP: 118/82  Pulse: 85  Temp: 97.9 F (36.6 C)   Vitals:   04/02/16 1113  Weight: 201 lb 12.8 oz (91.5 kg)  Height: 5\' 3"  (1.6 m)   Body mass index is 35.75 kg/m. Ideal Body Weight: Weight in (lb) to have BMI = 25: 140.8  GEN: WDWN, NAD, Non-toxic, A & O x 3,  overweight, looks well HEENT: Atraumatic, Normocephalic. Neck supple. No masses, No LAD.  Bilateral TM wnl, oropharynx normal.  PEERL,EOMI.   Ears and Nose: No external deformity. CV: RRR, No M/G/R. No JVD. No thrill. No extra heart sounds. PULM: CTA B, no wheezes, crackles, rhonchi. No retractions. No resp. distress. No accessory muscle use. ABD: S, NT, ND. No rebound. No HSM. She points out a tender area in her RLQ- on exam I noted a small, firm nodule in the subque tissues, likely a cyst EXTR: No c/c/e NEURO Normal gait.  PSYCH: Normally interactive. Conversant. Not depressed or anxious appearing.  Calm demeanor.  Breast: normal exam, no masses/ dimpling/ discharge Pelvic: normal, no vaginal lesions or discharge. Uterus normal, no CMT, no adnexal tendereness or masses She points out an area of concern on her right nipple that appears to be a seb keratosis   Creat clearance >70  Assessment and Plan: Physical exam  Screening for cervical cancer - Plan: Cytology - PAP  Screening for diabetes mellitus - Plan: Hemoglobin A1c  Screening for thyroid disorder - Plan: TSH  Encounter for vitamin deficiency screening - Plan: Vitamin D (25 hydroxy)  Screening examination for infectious disease - Plan: Hepatitis C antibody  Medication monitoring encounter - Plan: Hepatic function panel  Subcutaneous cyst - Plan: [place] Misc Soft Tissue  Obesity - Plan: Lorcaserin HCl 10 MG TABS  Labs pending as above Pap today Asked her to set up a mammogram She would like to try medication for weight loss.  Given history of CVA would discourage phentermine, but can try belviq. Discussed with her, will see her for a recheck in 2-3 months [place] to eval RLQ firm nodule  Will plan further follow- up pending labs.   Signed Lamar Blinks, MD  Received her labs 9/14.  LFTs are ok, will start an rx vitamin D for her for 12 weeks.  Pr-diabetes noted.  mychart message to pt  Results for orders placed or  performed in visit on 04/02/16  Hepatic function panel  Result Value Ref Range   Total Bilirubin 0.3 0.2 - 1.2 mg/dL   Bilirubin, Direct 0.0 0.0 - 0.3 mg/dL   Alkaline Phosphatase 77 39 - 117 U/L   AST 26 0 - 37 U/L   ALT 42 (H) 0 - 35 U/L   Total Protein 7.5 6.0 - 8.3 g/dL   Albumin 4.2 3.5 - 5.2 g/dL  Hemoglobin A1c  Result Value Ref Range   Hgb A1c MFr Bld 6.1 4.6 - 6.5 %  TSH  Result Value Ref Range   TSH 2.37 0.35 - 4.50 uIU/mL  Hepatitis C antibody  Result Value Ref Range   HCV Ab NEGATIVE NEGATIVE  Vitamin D (25 hydroxy)  Result Value Ref Range   VITD 25.88 (L) 30.00 - 100.00 ng/mL

## 2016-04-03 ENCOUNTER — Ambulatory Visit (HOSPITAL_BASED_OUTPATIENT_CLINIC_OR_DEPARTMENT_OTHER): Payer: PPO

## 2016-04-03 ENCOUNTER — Encounter: Payer: Self-pay | Admitting: Family Medicine

## 2016-04-03 DIAGNOSIS — E119 Type 2 diabetes mellitus without complications: Secondary | ICD-10-CM | POA: Insufficient documentation

## 2016-04-03 DIAGNOSIS — R7303 Prediabetes: Secondary | ICD-10-CM | POA: Insufficient documentation

## 2016-04-03 LAB — HEPATITIS C ANTIBODY: HCV AB: NEGATIVE

## 2016-04-03 LAB — CYTOLOGY - PAP

## 2016-04-03 MED ORDER — VITAMIN D (ERGOCALCIFEROL) 1.25 MG (50000 UNIT) PO CAPS: 50000.0000 [IU] | ORAL_CAPSULE | ORAL | 0 refills | Status: DC

## 2016-04-03 NOTE — Addendum Note (Signed)
Addended by: Lamar Blinks C on: 04/03/2016 08:48 AM   Modules accepted: Orders

## 2016-04-04 MED ORDER — HYDROCHLOROTHIAZIDE 25 MG PO TABS: 25.0000 mg | ORAL_TABLET | ORAL | 3 refills | Status: DC

## 2016-04-07 ENCOUNTER — Ambulatory Visit (HOSPITAL_BASED_OUTPATIENT_CLINIC_OR_DEPARTMENT_OTHER): Payer: PPO

## 2016-04-11 NOTE — Progress Notes (Signed)
HPI: 60 yo female for evaluation of chest pain. Seen 8/17 with chest pain; troponin negative. Patient recently moved to this area from [place]. In the past she was told she had a cardiomyopathy but a follow-up cardiologist told her she did not. She has occasional pain under her left breast. It is described as a lightning sensation lasting seconds. It is nonexertional. No radiation. She has some associated dyspnea but no nausea or diaphoresis. There is associated tingling in her upper extremities and left lower extremity with these pains. She has mild dyspnea on exertion. She does not have syncope. Because of the above we were asked to evaluate.  Current Outpatient Prescriptions  Medication Sig Dispense Refill  . aspirin 325 MG EC tablet Take 650 mg by mouth daily as needed (for chest pain episodes).     Marland Kitchen aspirin 81 MG tablet Take 81 mg by mouth daily.    . hydrochlorothiazide (HYDRODIURIL) 25 MG tablet Take 1 tablet (25 mg total) by mouth every other day. 90 tablet 3  . ibuprofen (ADVIL,MOTRIN) 200 MG tablet Take 800 mg by mouth every 6 (six) hours as needed (for back pain).    . meclizine (ANTIVERT) 25 MG tablet Take 1 tablet (25 mg total) by mouth 3 (three) times daily as needed for dizziness. 30 tablet 0  . Pyridoxine HCl (VITAMIN B-6) 500 MG tablet Take 500 mg by mouth every other day.     . vitamin B-12 (CYANOCOBALAMIN) 500 MCG tablet Take 500 mcg by mouth every other day.     . Vitamin D, Ergocalciferol, (DRISDOL) 50000 units CAPS capsule Take 1 capsule (50,000 Units total) by mouth every 7 (seven) days. 12 capsule 0   No current facility-administered medications for this visit.     Allergies  Allergen Reactions  . Codeine Nausea And Vomiting     Past Medical History:  Diagnosis Date  . Cardiomyopathy ([place])   . Hypertension   . PUD (peptic ulcer disease)   . Stroke ([place])   . Vertigo     Past Surgical History:  Procedure Laterality Date  . BACK SURGERY    . CARPAL  TUNNEL RELEASE      Social History   Social History  . Marital status: Single    Spouse name: N/A  . Number of children: 1  . Years of education: N/A   Occupational History  .      retired   Social History Main Topics  . Smoking status: Former Smoker    Quit date: 07/21/2002  . Smokeless tobacco: Never Used  . Alcohol use Yes     Comment: occasional  . Drug use: Unknown  . Sexual activity: Not on file   Other Topics Concern  . Not on file   Social History Narrative  . No narrative on file    Family History  Problem Relation Age of Onset  . Hypertension Mother   . Arthritis Mother   . Heart failure Mother   . Kidney disease Father   . Hypertension Father     ROS: Back pain but  no fevers or chills, productive cough, hemoptysis, dysphasia, odynophagia, melena, hematochezia, dysuria, hematuria, rash, seizure activity, orthopnea, PND, pedal edema, claudication. Remaining systems are negative.  Physical Exam:   Blood pressure 128/88, pulse 66, height 5\' 3"  (1.6 m), weight 199 lb 4 oz (90.4 kg).  General:  Well developed/well nourished in NAD Skin warm/dry Patient not depressed No peripheral clubbing Back-normal HEENT-normal/normal eyelids Neck  supple/normal carotid upstroke bilaterally; no bruits; no JVD; no thyromegaly chest - CTA/ normal expansion CV - RRR/normal S1 and S2; no murmurs, rubs or gallops;  PMI nondisplaced Abdomen -NT/ND, no HSM, no mass, + bowel sounds, no bruit 2+ femoral pulses, no bruits Ext-no edema, chords, 2+ DP Neuro-grossly nonfocal  ECG Sinus, LVH, nonspecific T wave changes  A/P  1 chest pain-symptoms are atypical. Plan exercise treadmill for risk stratification.  2 dyspnea-schedule echocardiogram to assess LV function.  3 question history of cardiomyopathy-plan echocardiogram to assess LV function and wall thickness.  Kirk Ruths, MD

## 2016-04-14 ENCOUNTER — Encounter: Payer: Self-pay | Admitting: Cardiology

## 2016-04-14 ENCOUNTER — Ambulatory Visit (INDEPENDENT_AMBULATORY_CARE_PROVIDER_SITE_OTHER): Payer: PPO | Admitting: Cardiology

## 2016-04-14 VITALS — BP 128/88 | HR 66 | Ht 63.0 in | Wt 199.2 lb

## 2016-04-14 DIAGNOSIS — I429 Cardiomyopathy, unspecified: Secondary | ICD-10-CM

## 2016-04-14 DIAGNOSIS — R072 Precordial pain: Secondary | ICD-10-CM

## 2016-04-14 DIAGNOSIS — R0600 Dyspnea, unspecified: Secondary | ICD-10-CM

## 2016-04-14 NOTE — Patient Instructions (Signed)
Medication Instructions:   NO CHANGE  Testing/Procedures:  Your physician has requested that you have an echocardiogram. Echocardiography is a painless test that uses sound waves to create images of your heart. It provides your doctor with information about the size and shape of your heart and how well your heart's chambers and valves are working. This procedure takes approximately one hour. There are no restrictions for this procedure.   Your physician has requested that you have an exercise tolerance test. For further information please visit [email or web address]. Please also follow instruction sheet, as given.    Follow-Up:  Your physician wants you to follow-up in: [place] will receive a reminder letter in the mail two months in advance. If you don't receive a letter, please call our office to schedule the follow-up appointment.   If you need a refill on your cardiac medications before your next appointment, please call your pharmacy.    Exercise Stress Electrocardiogram An exercise stress electrocardiogram is a test to check how blood flows to your heart. It is done to find areas of poor blood flow. You will need to walk on a treadmill for this test. The electrocardiogram will record your heartbeat when you are at rest and when you are exercising. BEFORE THE PROCEDURE  Do not have drinks with caffeine or foods with caffeine for 24 hours before the test, or as told by your doctor. This includes coffee, tea (even decaf tea), sodas, chocolate, and cocoa.  Follow your doctor's instructions about eating and drinking before the test.  Ask your doctor what medicines you should or should not take before the test. Take your medicines with water unless told by your doctor not to.  If you use an inhaler, bring it with you to the test.  Bring a snack to eat after the test.  Do not  smoke for 4 hours before the test.  Do not put lotions,  powders, creams, or oils on your chest before the test.  Wear comfortable shoes and clothing. PROCEDURE  You will have patches put on your chest. Small areas of your chest may need to be shaved. Wires will be connected to the patches.  Your heart rate will be watched while you are resting and while you are exercising.  You will walk on the treadmill. The treadmill will slowly get faster to raise your heart rate.  The test will take about 1-2 hours. AFTER THE PROCEDURE  Your heart rate and blood pressure will be watched after the test.  You may return to your normal diet, activities, and medicines or as told by your doctor.   This information is not intended to replace advice given to you by your health care provider. Make sure you discuss any questions you have with your health care provider.   Document Released: 12/24/2007 Document Revised: 07/28/2014 Document Reviewed: 03/14/2013 Elsevier Interactive Patient Education [employer].

## 2016-04-17 ENCOUNTER — Telehealth (HOSPITAL_COMMUNITY): Payer: Self-pay

## 2016-04-17 NOTE — Telephone Encounter (Signed)
Encounter complete. 

## 2016-04-19 ENCOUNTER — Ambulatory Visit (HOSPITAL_BASED_OUTPATIENT_CLINIC_OR_DEPARTMENT_OTHER)
Admission: RE | Admit: 2016-04-19 | Discharge: 2016-04-19 | Disposition: A | Discharge status: Home / Self Care | Payer: PPO | Source: Ambulatory Visit | Attending: Family Medicine | Admitting: Family Medicine

## 2016-04-19 DIAGNOSIS — R1909 Other intra-abdominal and pelvic swelling, mass and lump: Secondary | ICD-10-CM | POA: Diagnosis not present

## 2016-04-19 DIAGNOSIS — L729 Follicular cyst of the skin and subcutaneous tissue, unspecified: Secondary | ICD-10-CM

## 2016-04-19 DIAGNOSIS — Z1231 Encounter for screening mammogram for malignant neoplasm of breast: Secondary | ICD-10-CM | POA: Diagnosis not present

## 2016-04-22 ENCOUNTER — Ambulatory Visit (HOSPITAL_COMMUNITY)
Admission: RE | Admit: 2016-04-22 | Discharge: 2016-04-22 | Disposition: A | Discharge status: Home / Self Care | Payer: PPO | Source: Ambulatory Visit | Attending: Cardiovascular Disease | Admitting: Cardiovascular Disease

## 2016-04-22 ENCOUNTER — Telehealth: Payer: Self-pay | Admitting: *Deleted

## 2016-04-22 DIAGNOSIS — R072 Precordial pain: Secondary | ICD-10-CM | POA: Insufficient documentation

## 2016-04-22 DIAGNOSIS — R9439 Abnormal result of other cardiovascular function study: Secondary | ICD-10-CM

## 2016-04-22 LAB — EXERCISE TOLERANCE TEST
CHL RATE OF PERCEIVED EXERTION: 18
CSEPED: 6 min
CSEPEDS: 41 s
CSEPHR: 101 %
Estimated workload: 8 METS
MPHR: 161 {beats}/min
Peak HR: 164 {beats}/min
Rest HR: 70 {beats}/min

## 2016-04-22 NOTE — Telephone Encounter (Signed)
pt aware of results  Order placed and sent to admin for scheduling

## 2016-04-22 NOTE — Telephone Encounter (Signed)
-----   Message from Lelon Perla, MD sent at 04/22/2016  2:45 PM EDT ----- Schedule stress echo Kirk Ruths

## 2016-04-23 ENCOUNTER — Telehealth (HOSPITAL_COMMUNITY): Payer: Self-pay | Admitting: *Deleted

## 2016-04-23 NOTE — Telephone Encounter (Signed)
Patient given instruction for stress echo on 04/24/16   Kirstie Peri

## 2016-04-24 ENCOUNTER — Ambulatory Visit (HOSPITAL_BASED_OUTPATIENT_CLINIC_OR_DEPARTMENT_OTHER): Payer: PPO

## 2016-04-24 ENCOUNTER — Ambulatory Visit (HOSPITAL_COMMUNITY): Payer: PPO

## 2016-04-24 ENCOUNTER — Encounter (HOSPITAL_COMMUNITY): Payer: Self-pay

## 2016-04-24 ENCOUNTER — Encounter (HOSPITAL_COMMUNITY): Payer: Self-pay | Admitting: Radiology

## 2016-04-24 ENCOUNTER — Ambulatory Visit (HOSPITAL_COMMUNITY): Payer: PPO | Attending: Cardiology

## 2016-04-24 ENCOUNTER — Other Ambulatory Visit: Payer: Self-pay

## 2016-04-24 DIAGNOSIS — I429 Cardiomyopathy, unspecified: Secondary | ICD-10-CM | POA: Insufficient documentation

## 2016-04-24 DIAGNOSIS — I340 Nonrheumatic mitral (valve) insufficiency: Secondary | ICD-10-CM | POA: Diagnosis not present

## 2016-04-24 DIAGNOSIS — I5189 Other ill-defined heart diseases: Secondary | ICD-10-CM | POA: Insufficient documentation

## 2016-04-24 DIAGNOSIS — R072 Precordial pain: Secondary | ICD-10-CM

## 2016-04-24 DIAGNOSIS — R9439 Abnormal result of other cardiovascular function study: Secondary | ICD-10-CM

## 2016-04-24 MED ORDER — PERFLUTREN LIPID MICROSPHERE
1.0000 mL | INTRAVENOUS | Status: AC | PRN
  Administered 2016-04-24: 2 mL via INTRAVENOUS

## 2016-04-24 NOTE — CV Procedure (Signed)
Stress echocardiogram was cancelled per DOD, Dr. Harrington Challenger due to abnormal echocardiogram. 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

## 2016-04-25 ENCOUNTER — Other Ambulatory Visit: Payer: Self-pay | Admitting: *Deleted

## 2016-04-25 DIAGNOSIS — I428 Other cardiomyopathies: Secondary | ICD-10-CM

## 2016-04-28 ENCOUNTER — Other Ambulatory Visit (HOSPITAL_COMMUNITY): Payer: PPO

## 2016-04-28 ENCOUNTER — Encounter: Payer: Self-pay | Admitting: Family Medicine

## 2016-04-29 ENCOUNTER — Telehealth: Payer: Self-pay | Admitting: Cardiology

## 2016-04-29 MED ORDER — IBUPROFEN 800 MG PO TABS: 800.0000 mg | ORAL_TABLET | Freq: Three times a day (TID) | ORAL | 1 refills | Status: DC | PRN

## 2016-04-29 NOTE — Telephone Encounter (Signed)
Pt calling to set up cardiac MRI. Aware I will send msg to schedulers to get this arranged. Pt maybe reached at [phone].

## 2016-04-29 NOTE — Telephone Encounter (Signed)
New message   Pt verbalized that she is calling for the rn to reschedule procedure

## 2016-05-01 ENCOUNTER — Inpatient Hospital Stay (HOSPITAL_COMMUNITY): Admission: RE | Admit: 2016-05-01 | Payer: PPO | Source: Ambulatory Visit

## 2016-05-14 ENCOUNTER — Ambulatory Visit (INDEPENDENT_AMBULATORY_CARE_PROVIDER_SITE_OTHER): Payer: PPO | Admitting: Neurology

## 2016-05-14 ENCOUNTER — Telehealth: Payer: Self-pay

## 2016-05-14 ENCOUNTER — Encounter: Payer: Self-pay | Admitting: Neurology

## 2016-05-14 ENCOUNTER — Other Ambulatory Visit (INDEPENDENT_AMBULATORY_CARE_PROVIDER_SITE_OTHER): Payer: PPO

## 2016-05-14 VITALS — BP 120/74 | HR 88 | Ht 63.0 in | Wt 204.0 lb

## 2016-05-14 DIAGNOSIS — G44219 Episodic tension-type headache, not intractable: Secondary | ICD-10-CM

## 2016-05-14 DIAGNOSIS — R42 Dizziness and giddiness: Secondary | ICD-10-CM

## 2016-05-14 DIAGNOSIS — R413 Other amnesia: Secondary | ICD-10-CM

## 2016-05-14 DIAGNOSIS — I679 Cerebrovascular disease, unspecified: Secondary | ICD-10-CM | POA: Diagnosis not present

## 2016-05-14 LAB — LIPID PANEL
CHOL/HDL RATIO: 4
CHOLESTEROL: 223 mg/dL — AB (ref 0–200)
HDL: 50.9 mg/dL (ref >39.00)
LDL CALC: 148 mg/dL — AB (ref 0–99)
NonHDL: 172.19
Triglycerides: 123 mg/dL (ref 0.0–149.0)
VLDL: 24.6 mg/dL (ref 0.0–40.0)

## 2016-05-14 LAB — VITAMIN B12

## 2016-05-14 LAB — TSH: TSH: 2.82 u[IU]/mL (ref 0.35–4.50)

## 2016-05-14 MED ORDER — DIAZEPAM 5 MG PO TABS: ORAL_TABLET | ORAL | 0 refills | Status: DC

## 2016-05-14 NOTE — Telephone Encounter (Signed)
Pt called to request something to take during scan as she is claustrophobic . Please advise.

## 2016-05-14 NOTE — Patient Instructions (Addendum)
1.  Continue the aspirin 81mg  but take it DAILY 2.  We will check a fasting lipid panel.  Due to history of stroke, you should be on a statin 3.  We will check MRA of head and carotid doppler 4.  For memory, will check B12 and TSH.  If unremarkable, will refer you for neuropsychological testing 5.  Follow up in 4 months.

## 2016-05-14 NOTE — Progress Notes (Signed)
NEUROLOGY CONSULTATION NOTE  Amanda Saunders MRN: [id number] DOB: 1955/08/15  Referring provider: Dr. Lorelei Pont Primary care provider: Dr. Lorelei Pont  Reason for consult:  CVA  HISTORY OF PRESENT ILLNESS: Amanda Saunders is a 59 year old left-handed woman who presents for history of stroke.  History obtained by patient, her husband (who accompanies her today) and hospital notes.  She had a cerebrovascular event when she was living in [place] in 2015.  She said she presented with right facial droop and left arm numbness with nausea.  She said a stroke was verified on brain imaging and she was started on ASA.  She was not started on a statin.  She was also told she had cardiomyopathy.  Sometimes, she would skip doses of her ASA.  Since the stroke, she reports: 1.  Headaches:  Bifrontal, nonthrobbing, sometimes with nausea, and occurs twice a week.  They respond to Tylenol or Motrin. 2.  Memory problems.  She forgets conversations.  She has become disoriented once when driving on familiar route.   It is a concern for some family members  She moved to [place] in May.  She presented to the hospital in August for acute onset of spinning sensation and feeling of nausea.  CT of head revealed no acute findings.  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  She continues to have a constant feeling of wooziness.  She presented to [facility] on 03/26/16 with bilateral arm and left leg numbness and chest pain.  EKG and troponins were negative for ACS.  Head CT was negative for acute findings.  She was transferred to [facility] for MRI and further evaluation.  MRI of brain was personally reviewed and revealed advanced chronic small vessel ischemic changes but no acute infarct.  The numbness improved and at time of discharge, she only reported slight residual numbness on tip of her thumb.    She reportedly was told she had cardiomyopathy in [place].  She  followed up with cardiology due to this possible past medical history, as well as atypical chest pain and dyspnea. EKG showed sinus rhythm, LVH and nonspecific T wave changes.  She underwent an Exercise Stress Test on 04/22/16 which was stopped due to fatigue and shortness of breath.  Blood pressure and heart rate demonstrated normal response and no significant arrhythmias were noted.  ECG was uninterpretable due to resting ST abnormalities.  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  A cardiac MRI was ordered.  Hgb A1c is 6.1.  Hepatic panel shows Total Bili 0.3, ALP 77, AST 26 and ALT 42.  TSH is 2.37.  PAST MEDICAL HISTORY: Past Medical History:  Diagnosis Date  . Cardiomyopathy ([place])   . Hypertension   . PUD (peptic ulcer disease)   . Stroke ([place])   . Vertigo     PAST SURGICAL HISTORY: Past Surgical History:  Procedure Laterality Date  . BACK SURGERY    . CARPAL TUNNEL RELEASE      MEDICATIONS: Current Outpatient Prescriptions on File Prior to Visit  Medication Sig Dispense Refill  . aspirin 325 MG EC tablet Take 650 mg by mouth daily as needed (for chest pain episodes).     Marland Kitchen aspirin 81 MG tablet Take 81 mg by mouth daily.    . hydrochlorothiazide (HYDRODIURIL) 25 MG tablet Take 1 tablet (25 mg total) by mouth every other day. 90 tablet 3  .  ibuprofen (ADVIL,MOTRIN) 800 MG tablet Take 1 tablet (800 mg total) by mouth every 8 (eight) hours as needed (for back pain). 30 tablet 1  . meclizine (ANTIVERT) 25 MG tablet Take 1 tablet (25 mg total) by mouth 3 (three) times daily as needed for dizziness. 30 tablet 0  . Pyridoxine HCl (VITAMIN B-6) 500 MG tablet Take 500 mg by mouth every other day.     . vitamin B-12 (CYANOCOBALAMIN) 500 MCG tablet Take 500 mcg by mouth every other day.     . Vitamin D, Ergocalciferol, (DRISDOL) 50000 units CAPS capsule Take 1 capsule (50,000 Units total) by mouth every 7 (seven) days. 12 capsule  0   No current facility-administered medications on file prior to visit.     ALLERGIES: Allergies  Allergen Reactions  . Codeine Nausea And Vomiting    FAMILY HISTORY: Family History  Problem Relation Age of Onset  . Hypertension Mother   . Arthritis Mother   . Heart failure Mother   . Stroke Mother   . Kidney disease Father   . Hypertension Father     SOCIAL HISTORY: Social History   Social History  . Marital status: Single    Spouse name: N/A  . Number of children: 1  . Years of education: N/A   Occupational History  .      retired   Social History Main Topics  . Smoking status: Former Smoker    Quit date: 07/21/2002  . Smokeless tobacco: Never Used  . Alcohol use Yes     Comment: occasional  . Drug use: Unknown  . Sexual activity: Not on file   Other Topics Concern  . Not on file   Social History Narrative  . No narrative on file    REVIEW OF SYSTEMS: Constitutional: No fevers, chills, or sweats, no generalized fatigue, change in appetite Eyes: No visual changes, double vision, eye pain Ear, nose and throat: No hearing loss, ear pain, nasal congestion, sore throat Cardiovascular: No chest pain, palpitations Respiratory:  No shortness of breath at rest or with exertion, wheezes GastrointestinaI: No nausea, vomiting, diarrhea, abdominal pain, fecal incontinence Genitourinary:  No dysuria, urinary retention or frequency Musculoskeletal:  No neck pain, back pain Integumentary: No rash, pruritus, skin lesions Neurological: as above Psychiatric: No depression, insomnia, anxiety Endocrine: No palpitations, fatigue, diaphoresis, mood swings, change in appetite, change in weight, increased thirst Hematologic/Lymphatic:  No purpura, petechiae. Allergic/Immunologic: no itchy/runny eyes, nasal congestion, recent allergic reactions, rashes  PHYSICAL EXAM: Vitals:   05/14/16 1010  BP: 120/74  Pulse: 88   General: No acute distress.  Patient appears  well-groomed.  Head:  Normocephalic/atraumatic Eyes:  fundi examined but not visualized Neck: supple, no paraspinal tenderness, full range of motion Back: No paraspinal tenderness Heart: regular rate and rhythm Lungs: Clear to auscultation bilaterally. Vascular: No carotid bruits. Neurological Exam: Mental status: alert and oriented to person, place, and time, recent and remote memory intact, fund of knowledge intact, attention and concentration intact, speech fluent and not dysarthric, language intact. MMSE - Mini Mental State Exam 05/14/2016  Orientation to time 5  Orientation to Place 5  Registration 3  Attention/ Calculation 4  Recall 3  Language- name 2 objects 2  Language- repeat 1  Language- follow 3 step command 3  Language- read & follow direction 1  Write a sentence 1  Copy design 1  Total score 29   Cranial nerves: CN I: not tested CN II: pupils equal, round and reactive to  light, visual fields intact CN III, IV, VI:  full range of motion, no nystagmus, no ptosis CN V: facial sensation intact CN VII: upper and lower face symmetric CN VIII: hearing intact CN IX, X: gag intact, uvula midline CN XI: sternocleidomastoid and trapezius muscles intact CN XII: tongue midline Bulk & Tone: normal, no fasciculations. Motor:  5/5 throughout  Sensation: temperature and vibration sensation intact. Deep Tendon Reflexes:  2+ throughout, toes downgoing.  Finger to nose testing:  Without dysmetria.  Heel to shin:  Without dysmetria.  Gait:  Normal station and stride.  Able to turn but difficulty with tandem walk. Romberg negative.  IMPRESSION: 1.  Cerebrovascular disease with reported history of stroke in 2015.  I am not sure why she was never started on a statin following her stroke in 2015.  Even though the recent brain MRI does not reveal an evidence of a chronic stroke, she does have significant chronic small vessel ischemic changes.    It should be noted that I do not think  her recent event from last month was a stroke.  She exhibited bilateral symptoms (bilateral arm numbness) with no objective findings and negative brain MRI, as well as chest pain (which is not suggestive of cerebrovascular etiology).  2.  Memory deficits.  Not sure if this is physiologic (related to stroke) or not.  No cognitive impairment really appreciated.  3.  Dizziness.  Now a subjective dizziness. 4.  Tension-type headache  PLAN: 1.  Continue ASA 81mg  but to take daily 2.  Will check fasting lipid panel and initiate statin therapy appropriately 3.  Will check B12 level.  If unremarkable, would refer for neuropsychological testing. 4.  Given the persistent dizziness and cerebrovascular disease, will check MRA of head and carotid dopplers 5.  Limit use of pain relievers to no more than 2 days out of the week. 6.  Follow up in 4 months.  Thank you for allowing me to take part in the care of this patient.  Metta Clines, DO  CC:  Lamar Blinks, MD

## 2016-05-14 NOTE — Telephone Encounter (Signed)
Valium 5mg  to take 15 to 20 minute prior to test.  May repeat dose once at time of test if needed (#2).  She will need a driver.

## 2016-05-15 ENCOUNTER — Telehealth: Payer: Self-pay

## 2016-05-15 MED ORDER — ATORVASTATIN CALCIUM 10 MG PO TABS: 10.0000 mg | ORAL_TABLET | Freq: Every day | ORAL | 3 refills | Status: DC

## 2016-05-15 NOTE — Telephone Encounter (Signed)
-----   Message from Pieter Partridge, DO sent at 05/15/2016  7:11 AM EDT ----- LDL (bad cholesterol) is elevated.  I would like to start Lipitor 10mg  daily. B12 and TSH are okay.  I would like to refer for neuropsychological testing for memory

## 2016-05-15 NOTE — Telephone Encounter (Signed)
Discussed with patient. RX sent in. Message sent to front desk.

## 2016-05-15 NOTE — Telephone Encounter (Signed)
Mychart message sent. Will attempt to call pt this afternoon if no response yet.

## 2016-05-22 ENCOUNTER — Ambulatory Visit (HOSPITAL_COMMUNITY)
Admission: RE | Admit: 2016-05-22 | Discharge: 2016-05-22 | Disposition: A | Discharge status: Home / Self Care | Payer: PPO | Source: Ambulatory Visit | Attending: Cardiology | Admitting: Cardiology

## 2016-05-22 ENCOUNTER — Telehealth: Payer: Self-pay | Admitting: Cardiology

## 2016-05-22 DIAGNOSIS — I428 Other cardiomyopathies: Secondary | ICD-10-CM | POA: Diagnosis not present

## 2016-05-22 DIAGNOSIS — I429 Cardiomyopathy, unspecified: Secondary | ICD-10-CM | POA: Diagnosis not present

## 2016-05-22 LAB — CREATININE, SERUM
Creatinine, Ser: 0.92 mg/dL (ref 0.44–1.00)
GFR calc non Af Amer: >60 mL/min (ref >60)

## 2016-05-22 MED ORDER — MIDAZOLAM HCL 2 MG/2ML IJ SOLN
INTRAMUSCULAR | Status: AC
  Filled 2016-05-22: qty 2

## 2016-05-22 MED ORDER — GADOBENATE DIMEGLUMINE 529 MG/ML IV SOLN
30.0000 mL | Freq: Once | INTRAVENOUS | Status: AC | PRN
  Administered 2016-05-22: 30 mL via INTRAVENOUS

## 2016-05-22 MED ORDER — MIDAZOLAM HCL 2 MG/2ML IJ SOLN
1.0000 mg | Freq: Once | INTRAMUSCULAR | Status: AC
  Administered 2016-05-22: 1 mg via INTRAVENOUS
  Filled 2016-05-22: qty 1

## 2016-05-22 NOTE — Telephone Encounter (Signed)
New message     Patient calling back stating she told the office she was claustrophobic.   Patient is asking for medication be called in .   Patient is stating she does not want to r/s her Cardiac Mri.

## 2016-05-22 NOTE — Telephone Encounter (Signed)
Returned call, patient notifies me an anxiolytic was ordered - no follow up required at this time. She voiced thanks for call.

## 2016-05-28 ENCOUNTER — Telehealth: Payer: Self-pay

## 2016-05-28 ENCOUNTER — Ambulatory Visit
Admission: RE | Admit: 2016-05-28 | Discharge: 2016-05-28 | Disposition: A | Discharge status: Home / Self Care | Payer: PPO | Source: Ambulatory Visit | Attending: Neurology | Admitting: Neurology

## 2016-05-28 DIAGNOSIS — I6621 Occlusion and stenosis of right posterior cerebral artery: Secondary | ICD-10-CM | POA: Diagnosis not present

## 2016-05-28 DIAGNOSIS — I679 Cerebrovascular disease, unspecified: Secondary | ICD-10-CM

## 2016-05-28 DIAGNOSIS — R42 Dizziness and giddiness: Secondary | ICD-10-CM

## 2016-05-28 DIAGNOSIS — R413 Other amnesia: Secondary | ICD-10-CM

## 2016-05-28 DIAGNOSIS — I6523 Occlusion and stenosis of bilateral carotid arteries: Secondary | ICD-10-CM | POA: Diagnosis not present

## 2016-05-28 NOTE — Telephone Encounter (Signed)
Message relayed to patient. Verbalized understanding and denied questions.   

## 2016-05-28 NOTE — Telephone Encounter (Signed)
-----   Message from Pieter Partridge, DO sent at 05/28/2016 11:23 AM EST ----- Carotid doppler shows no significant blockage in the arteries of the neck, which is good.  Incidentally, it reveals a thyroid nodule.  Often, this turns out to be benign but would require further imaging.  I recommend that she contact Dr. Lorelei Pont for further instruction.  I will route this note to her as well.

## 2016-05-29 ENCOUNTER — Telehealth: Payer: Self-pay

## 2016-05-29 ENCOUNTER — Telehealth: Payer: Self-pay | Admitting: Family Medicine

## 2016-05-29 DIAGNOSIS — E041 Nontoxic single thyroid nodule: Secondary | ICD-10-CM

## 2016-05-29 NOTE — Telephone Encounter (Signed)
Sent via mychart

## 2016-05-29 NOTE — Telephone Encounter (Signed)
-----   Message from Pieter Partridge, DO sent at 05/28/2016 11:23 AM EST ----- Carotid doppler shows no significant blockage in the arteries of the neck, which is good.  Incidentally, it reveals a thyroid nodule.  Often, this turns out to be benign but would require further imaging.  I recommend that she contact Dr. Lorelei Pont for further instruction.  I will route this note to her as well.

## 2016-05-29 NOTE — Telephone Encounter (Signed)
Called pt and d/w her-  I will order an [place] for her to look further at her thyroid.  She states understanding  Most recent TSH was a few days ago, in range

## 2016-05-29 NOTE — Telephone Encounter (Signed)
-----   Message from Pieter Partridge, DO sent at 05/29/2016  8:53 AM EST ----- Blood vessels in the brain show some atherosclerosis and narrowing of one of the arteries, but nothing that would change management.

## 2016-05-30 NOTE — Progress Notes (Signed)
HPI: fu cardiomyopathy; exercise treadmill October 2017 interpreted as nondiagnostic due to baseline changes. Echocardiogram October 2017 showed ejection fraction [id number], grade 1 diastolic dysfunction and mild mitral regurgitation. 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 Carotid Dopplers November 2017 showed 1-49% bilateral stenosis. 2.4 cm left thyroid nodule and dedicated ultrasound recommended. Since last seen, she has mild dyspnea on exertion but no orthopnea, PND, pedal edema, palpitations, syncope or exertional chest pain. She occasionally has a brief pain in her chest not related to exertion.  Current Outpatient Prescriptions  Medication Sig Dispense Refill  . aspirin 325 MG EC tablet Take 650 mg by mouth daily as needed (for chest pain episodes).     Marland Kitchen aspirin 81 MG tablet Take 81 mg by mouth daily.    Marland Kitchen atorvastatin (LIPITOR) 10 MG tablet Take 1 tablet (10 mg total) by mouth daily. 30 tablet 3  . diazepam (VALIUM) 5 MG tablet Take 20 - 30 minutes before scan. May repeat right before scan if needed 2 tablet 0  . hydrochlorothiazide (HYDRODIURIL) 25 MG tablet Take 1 tablet (25 mg total) by mouth every other day. 90 tablet 3  . ibuprofen (ADVIL,MOTRIN) 800 MG tablet Take 1 tablet (800 mg total) by mouth every 8 (eight) hours as needed (for back pain). 30 tablet 1  . meclizine (ANTIVERT) 25 MG tablet Take 1 tablet (25 mg total) by mouth 3 (three) times daily as needed for dizziness. 30 tablet 0  . Pyridoxine HCl (VITAMIN B-6) 500 MG tablet Take 500 mg by mouth every other day.     . vitamin B-12 (CYANOCOBALAMIN) 500 MCG tablet Take 500 mcg by mouth every other day.     . Vitamin D, Ergocalciferol, (DRISDOL) 50000 units CAPS capsule Take 1 capsule (50,000 Units total) by mouth every 7 (seven) days. 12 capsule 0   No current facility-administered medications  for this visit.      Past Medical History:  Diagnosis Date  . Cardiomyopathy ([place])   . Hypertension   . PUD (peptic ulcer disease)   . Stroke ([place])   . Vertigo     Past Surgical History:  Procedure Laterality Date  . BACK SURGERY    . CARPAL TUNNEL RELEASE      Social History   Social History  . Marital status: Single    Spouse name: N/A  . Number of children: 1  . Years of education: N/A   Occupational History  .      retired   Social History Main Topics  . Smoking status: Former Smoker    Quit date: 07/21/2002  . Smokeless tobacco: Never Used  . Alcohol use Yes     Comment: occasional  . Drug use: Unknown  . Sexual activity: Not on file   Other Topics Concern  . Not on file   Social History Narrative  . No narrative on file    Family History  Problem Relation Age of Onset  . Hypertension Mother   . Arthritis Mother   . Heart failure Mother   . Stroke Mother   . Kidney disease Father   . Hypertension Father     ROS: no fevers or chills, productive cough, hemoptysis, dysphasia, odynophagia, melena, hematochezia, dysuria, hematuria, rash, seizure activity, orthopnea, PND, pedal edema, claudication. Remaining systems are negative.  Physical Exam: Well-developed well-nourished in no acute distress.  Skin is warm and  dry.  HEENT is normal.  Neck is supple.  Chest is clear to auscultation with normal expansion.  Cardiovascular exam is regular rate and rhythm.  Abdominal exam nontender or distended. No masses palpated. Extremities show no edema. neuro grossly intact  ECG  A/P  1 Thyroid nodule-Managed by primary care. Dedicated thyroid ultrasound is ordered.  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 No history of syncope or family history of sudden death. Follow-up echocardiograms in the future.  3 chest pain-symptoms atypical. Previous exercise treadmill nondiagnostic. Schedule Lexiscan nuclear study for risk stratification.  4  hyperlipidemia-continue statin.  Kirk Ruths, MD

## 2016-06-06 ENCOUNTER — Ambulatory Visit (INDEPENDENT_AMBULATORY_CARE_PROVIDER_SITE_OTHER): Payer: PPO | Admitting: Cardiology

## 2016-06-06 ENCOUNTER — Encounter: Payer: Self-pay | Admitting: Cardiology

## 2016-06-06 VITALS — BP 130/80 | HR 70 | Ht 63.0 in | Wt 206.4 lb

## 2016-06-06 DIAGNOSIS — I428 Other cardiomyopathies: Secondary | ICD-10-CM | POA: Diagnosis not present

## 2016-06-06 DIAGNOSIS — R072 Precordial pain: Secondary | ICD-10-CM

## 2016-06-06 DIAGNOSIS — E7800 Pure hypercholesterolemia, unspecified: Secondary | ICD-10-CM | POA: Diagnosis not present

## 2016-06-06 MED ORDER — METOPROLOL SUCCINATE ER 25 MG PO TB24: 25.0000 mg | ORAL_TABLET | Freq: Every day | ORAL | 3 refills | Status: DC

## 2016-06-06 NOTE — Patient Instructions (Signed)
Medication Instructions:   START METOPROLOL SUCC ER 25 MG ONCE DAILY AT BEDTIME  Testing/Procedures:  Your physician has requested that you have a lexiscan myoview. For further information please visit [email or web address]. Please follow instruction sheet, as given. TAKE ALL MEDICATIONS PRIOR TO TESTING    Follow-Up:  Your physician wants you to follow-up in: [place] will receive a reminder letter in the mail two months in advance. If you don't receive a letter, please call our office to schedule the follow-up appointment.   If you need a refill on your cardiac medications before your next appointment, please call your pharmacy.

## 2016-06-16 ENCOUNTER — Other Ambulatory Visit: Payer: PPO

## 2016-06-17 ENCOUNTER — Encounter: Payer: PPO | Admitting: Psychology

## 2016-06-17 ENCOUNTER — Telehealth (HOSPITAL_COMMUNITY): Payer: Self-pay

## 2016-06-17 NOTE — Telephone Encounter (Signed)
Encounter complete. 

## 2016-06-19 ENCOUNTER — Ambulatory Visit (HOSPITAL_COMMUNITY)
Admission: RE | Admit: 2016-06-19 | Discharge: 2016-06-19 | Disposition: A | Discharge status: Home / Self Care | Payer: PPO | Source: Ambulatory Visit | Attending: Cardiology | Admitting: Cardiology

## 2016-06-19 DIAGNOSIS — R072 Precordial pain: Secondary | ICD-10-CM | POA: Insufficient documentation

## 2016-06-19 LAB — MYOCARDIAL PERFUSION IMAGING
CSEPPHR: 100 {beats}/min
Rest HR: 59 {beats}/min

## 2016-06-19 MED ORDER — TECHNETIUM TC 99M TETROFOSMIN IV KIT
32.0000 | PACK | Freq: Once | INTRAVENOUS | Status: AC | PRN
  Administered 2016-06-19: 32 via INTRAVENOUS
  Filled 2016-06-19: qty 32

## 2016-06-19 MED ORDER — TECHNETIUM TC 99M TETROFOSMIN IV KIT
10.2000 | PACK | Freq: Once | INTRAVENOUS | Status: AC | PRN
  Administered 2016-06-19: 10.2 via INTRAVENOUS
  Filled 2016-06-19: qty 11

## 2016-06-19 MED ORDER — REGADENOSON 0.4 MG/5ML IV SOLN
0.4000 mg | Freq: Once | INTRAVENOUS | Status: AC
  Administered 2016-06-19: 0.4 mg via INTRAVENOUS

## 2016-06-20 ENCOUNTER — Encounter: Payer: Self-pay | Admitting: Psychology

## 2016-06-24 ENCOUNTER — Ambulatory Visit
Admission: RE | Admit: 2016-06-24 | Discharge: 2016-06-24 | Disposition: A | Discharge status: Home / Self Care | Payer: PPO | Source: Ambulatory Visit | Attending: Family Medicine | Admitting: Family Medicine

## 2016-06-24 DIAGNOSIS — E041 Nontoxic single thyroid nodule: Secondary | ICD-10-CM

## 2016-06-24 DIAGNOSIS — E042 Nontoxic multinodular goiter: Secondary | ICD-10-CM | POA: Diagnosis not present

## 2016-06-25 ENCOUNTER — Encounter: Payer: Self-pay | Admitting: Family Medicine

## 2016-06-25 ENCOUNTER — Ambulatory Visit: Payer: PPO | Admitting: Family Medicine

## 2016-06-25 DIAGNOSIS — E041 Nontoxic single thyroid nodule: Secondary | ICD-10-CM

## 2016-06-25 NOTE — Telephone Encounter (Signed)
Thyroid US from today- impression as below-   IMPRESSION: Findings suggestive of multi nodular goiter. A 1 year follow-up of the dominant approximately 2.4 cm nodule within the left lobe of the thyroid (correlating with the nodule seen on preceding carotid Doppler ultrasound) is recommended to ensure stability. The remaining thyroid nodules do not meet imaging criteria to recommend percutaneous sampling or dedicated follow-up.  Lab Results  Component Value Date   TSH 2.82 05/14/2016  tsh done 05/14/16  Discussed with pt- I will order a repeat US for one year from now

## 2016-07-01 ENCOUNTER — Encounter: Payer: Self-pay | Admitting: Family Medicine

## 2016-07-01 ENCOUNTER — Other Ambulatory Visit: Payer: Self-pay | Admitting: Family Medicine

## 2016-07-01 DIAGNOSIS — E041 Nontoxic single thyroid nodule: Secondary | ICD-10-CM

## 2016-07-03 ENCOUNTER — Encounter: Payer: Self-pay | Admitting: Psychology

## 2016-07-03 ENCOUNTER — Ambulatory Visit (INDEPENDENT_AMBULATORY_CARE_PROVIDER_SITE_OTHER): Payer: PPO | Admitting: Psychology

## 2016-07-03 DIAGNOSIS — R413 Other amnesia: Secondary | ICD-10-CM

## 2016-07-03 DIAGNOSIS — I679 Cerebrovascular disease, unspecified: Secondary | ICD-10-CM
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
## 2016-07-08 ENCOUNTER — Telehealth: Payer: Self-pay | Admitting: Neurology

## 2016-07-08 DIAGNOSIS — R42 Dizziness and giddiness: Secondary | ICD-10-CM

## 2016-07-08 MED ORDER — MECLIZINE HCL 25 MG PO TABS
25.0000 mg | ORAL_TABLET | Freq: Three times a day (TID) | ORAL | 2 refills | Status: DC | PRN
Start: 2016-07-08 — End: 2018-01-18

## 2016-07-08 NOTE — Telephone Encounter (Signed)
Referral placed to:  [facility], [place]  Pt requested a refill on Meclizine. Also sent to pharmacy.

## 2016-07-08 NOTE — Telephone Encounter (Signed)
Other than meclizine, the only therapy I recommend is vestibular rehabilitation.

## 2016-07-08 NOTE — Telephone Encounter (Signed)
PT called and asked if Dr Georgie Chard nurse would give her a call back/Dawn CB# [phone]

## 2016-07-08 NOTE — Telephone Encounter (Signed)
Pt with complaints of on going dizziness. Pt is requesting something besides Meclizine, as it knocks her out. Pt has never had any treatment other than medication for dizziness. Please advise.

## 2016-07-09 ENCOUNTER — Ambulatory Visit: Payer: PPO | Admitting: Family Medicine

## 2016-07-10 ENCOUNTER — Encounter: Payer: Self-pay | Admitting: Psychology

## 2016-07-10 ENCOUNTER — Encounter: Payer: Self-pay | Admitting: Family Medicine

## 2016-07-17 ENCOUNTER — Encounter: Payer: PPO | Admitting: Psychology

## 2016-07-28 ENCOUNTER — Ambulatory Visit (INDEPENDENT_AMBULATORY_CARE_PROVIDER_SITE_OTHER): Payer: PPO | Admitting: Psychology

## 2016-07-28 DIAGNOSIS — R413 Other amnesia: Secondary | ICD-10-CM

## 2016-07-28 DIAGNOSIS — I679 Cerebrovascular disease, unspecified: Secondary | ICD-10-CM

## 2016-07-28 NOTE — Progress Notes (Signed)
   Neuropsychology Note  Amanda Saunders returned today for 3 hours of neuropsychological testing with technician, Milana Kidney, BS, under the supervision of Dr. Macarthur Critchley. The patient did not appear overtly distressed by the testing session, per behavioral observation or via self-report to the technician. Rest breaks were offered. Amanda Saunders will return within 2 weeks for a feedback session with Dr. Si Raider at which time her test performances, clinical impressions and treatment recommendations will be reviewed in detail. The patient understands she can contact our office should she require our assistance before this time.  Full report to follow.

## 2016-08-12 ENCOUNTER — Encounter: Payer: PPO | Admitting: Psychology

## 2016-08-13 ENCOUNTER — Telehealth: Payer: Self-pay | Admitting: Family Medicine

## 2016-08-13 ENCOUNTER — Ambulatory Visit: Payer: PPO | Admitting: *Deleted

## 2016-08-13 NOTE — Progress Notes (Deleted)
Pre visit review using our clinic review tool, if applicable. No additional management support is needed unless otherwise documented below in the visit note. 

## 2016-08-13 NOTE — Telephone Encounter (Signed)
Pt says that she received a no show fee for DOS 07/09/16. She said that she recall calling in before the appt. She went out of town.    Should fee remain or could charge be reversed for pt?   I will call to pt back once advised by PCP   CB: [phone]

## 2016-08-13 NOTE — Progress Notes (Deleted)
Subjective:   Amanda Saunders is a 61 y.o. female who presents for an Initial Medicare Annual Wellness Visit.  Review of Systems    No ROS.  Medicare Wellness Visit.     Sleep patterns: {SX; SLEEP PATTERNS:18802::"feels rested on waking","does not get up to void","gets up *** times nightly to void","*** hours nightly"}.   Home Safety/Smoke Alarms:   Living environment; residence and Firearm Safety: {Rehab home environment / accessibility:30080::"no firearms","firearms stored safely"}. Seat Belt Safety/Bike Helmet: Wears seat belt.   Counseling:   Eye Exam-  Dental-  Female:   Pap- last 04/02/16. NEGATIVE FOR INTRAEPITHELIAL LESIONS OR MALIGNANCY.      Mammo- last 04/19/16. BI-RADS CATEGORY  1: Negative.            CCS- last 09/04/11 w/ Dr. Burnetta Sabin. Diverticula, melanosis coli, polyp, internal hemorrhoids.     Objective:    There were no vitals filed for this visit. There is no height or weight on file to calculate BMI.   Current Medications (verified) Outpatient Encounter Prescriptions as of 08/13/2016  Medication Sig  . aspirin 325 MG EC tablet Take 650 mg by mouth daily as needed (for chest pain episodes).   Marland Kitchen aspirin 81 MG tablet Take 81 mg by mouth daily.  Marland Kitchen atorvastatin (LIPITOR) 10 MG tablet Take 1 tablet (10 mg total) by mouth daily.  . diazepam (VALIUM) 5 MG tablet Take 20 - 30 minutes before scan. May repeat right before scan if needed  . hydrochlorothiazide (HYDRODIURIL) 25 MG tablet Take 1 tablet (25 mg total) by mouth every other day.  . ibuprofen (ADVIL,MOTRIN) 800 MG tablet Take 1 tablet (800 mg total) by mouth every 8 (eight) hours as needed (for back pain).  . meclizine (ANTIVERT) 25 MG tablet Take 1 tablet (25 mg total) by mouth 3 (three) times daily as needed for dizziness.  . metoprolol succinate (TOPROL XL) 25 MG 24 hr tablet Take 1 tablet (25 mg total) by mouth daily.  . Pyridoxine HCl (VITAMIN B-6) 500 MG tablet Take 500 mg by mouth every other day.    . vitamin B-12 (CYANOCOBALAMIN) 500 MCG tablet Take 500 mcg by mouth every other day.   . Vitamin D, Ergocalciferol, (DRISDOL) 50000 units CAPS capsule Take 1 capsule (50,000 Units total) by mouth every 7 (seven) days.   No facility-administered encounter medications on file as of 08/13/2016.     Allergies (verified) Codeine   History: Past Medical History:  Diagnosis Date  . Cardiomyopathy ([place])   . Hypertension   . PUD (peptic ulcer disease)   . Stroke ([place])   . Vertigo    Past Surgical History:  Procedure Laterality Date  . BACK SURGERY    . CARPAL TUNNEL RELEASE     Family History  Problem Relation Age of Onset  . Hypertension Mother   . Arthritis Mother   . Heart failure Mother   . Stroke Mother   . Kidney disease Father   . Hypertension Father    Social History   Occupational History  .      retired   Social History Main Topics  . Smoking status: Former Smoker    Quit date: 07/21/2002  . Smokeless tobacco: Never Used  . Alcohol use Yes     Comment: occasional  . Drug use: No  . Sexual activity: Not on file    Tobacco Counseling Counseling given: Not Answered   Activities of Daily Living No flowsheet data found.  Immunizations and Health  Maintenance Immunization History  Administered Date(s) Administered  . Tdap 03/12/2016   Health Maintenance Due  Topic Date Due  . HIV Screening  03/21/1971    Patient Care Team: Darreld Mclean, MD as PCP - General (Family Medicine)  Indicate any recent Medical Services you may have received from other than [facility] providers in the past year (date may be approximate).     Assessment:   This is a routine wellness examination for Amanda Saunders. Physical assessment deferred to PCP.  Hearing/Vision screen No exam data present  Dietary issues and exercise activities discussed:    Diet (meal preparation, eat out, water intake, caffeinated beverages, dairy products, fruits and vegetables): {Desc;  diets:16563} Breakfast: Lunch:  Dinner:      Goals    None     Depression Screen No flowsheet data found.  Fall Risk Fall Risk  05/14/2016  Falls in the past year? No    Cognitive Function: MMSE - Mini Mental State Exam 05/14/2016  Orientation to time 5  Orientation to Place 5  Registration 3  Attention/ Calculation 4  Recall 3  Language- name 2 objects 2  Language- repeat 1  Language- follow 3 step command 3  Language- read & follow direction 1  Write a sentence 1  Copy design 1  Total score 29        Screening Tests Health Maintenance  Topic Date Due  . HIV Screening  03/21/1971  . INFLUENZA VACCINE  10/18/2016 (Originally 02/19/2016)  . MAMMOGRAM  04/19/2018  . PAP SMEAR  04/03/2019  . COLONOSCOPY  09/03/2021  . TETANUS/TDAP  03/12/2026  . Hepatitis C Screening  Completed      Plan:    Follow-up w/ PCP as scheduled.  During the course of the visit, Amanda Saunders was educated and counseled about the following appropriate screening and preventive services:   Vaccines to include Pneumoccal, Influenza, Hepatitis B, Td, Zostavax, HCV  Cardiovascular disease screening  Colorectal cancer screening  Diabetes screening  Glaucoma screening  Mammography/PAP  Nutrition counseling  Smoking cessation counseling  Patient Instructions (the written plan) were given to the patient.    Dorrene German, RN   08/13/2016

## 2016-08-13 NOTE — Telephone Encounter (Signed)
You can waive the fee

## 2016-09-15 ENCOUNTER — Ambulatory Visit: Payer: PPO | Admitting: Neurology

## 2016-09-15 DIAGNOSIS — Z029 Encounter for administrative examinations, unspecified: Secondary | ICD-10-CM

## 2016-09-24 ENCOUNTER — Emergency Department (HOSPITAL_COMMUNITY): Payer: PPO

## 2016-09-24 ENCOUNTER — Emergency Department (HOSPITAL_COMMUNITY)
Admission: EM | Admit: 2016-09-24 | Discharge: 2016-09-24 | Disposition: A | Discharge status: Home / Self Care | Payer: PPO | Attending: Emergency Medicine | Admitting: Emergency Medicine

## 2016-09-24 ENCOUNTER — Encounter (HOSPITAL_COMMUNITY): Payer: Self-pay | Admitting: Emergency Medicine

## 2016-09-24 DIAGNOSIS — Z87891 Personal history of nicotine dependence: Secondary | ICD-10-CM | POA: Diagnosis not present

## 2016-09-24 DIAGNOSIS — S86912A Strain of unspecified muscle(s) and tendon(s) at lower leg level, left leg, initial encounter: Secondary | ICD-10-CM | POA: Diagnosis not present

## 2016-09-24 DIAGNOSIS — Z79899 Other long term (current) drug therapy: Secondary | ICD-10-CM | POA: Insufficient documentation

## 2016-09-24 DIAGNOSIS — Y9343 Activity, gymnastics: Secondary | ICD-10-CM | POA: Insufficient documentation

## 2016-09-24 DIAGNOSIS — S86812A Strain of other muscle(s) and tendon(s) at lower leg level, left leg, initial encounter: Secondary | ICD-10-CM | POA: Insufficient documentation

## 2016-09-24 DIAGNOSIS — S8992XA Unspecified injury of left lower leg, initial encounter: Secondary | ICD-10-CM | POA: Diagnosis not present

## 2016-09-24 DIAGNOSIS — I10 Essential (primary) hypertension: Secondary | ICD-10-CM | POA: Diagnosis not present

## 2016-09-24 DIAGNOSIS — Z7982 Long term (current) use of aspirin: Secondary | ICD-10-CM | POA: Insufficient documentation

## 2016-09-24 DIAGNOSIS — Y9239 Other specified sports and athletic area as the place of occurrence of the external cause: Secondary | ICD-10-CM | POA: Diagnosis not present

## 2016-09-24 DIAGNOSIS — X58XXXA Exposure to other specified factors, initial encounter: Secondary | ICD-10-CM | POA: Insufficient documentation

## 2016-09-24 DIAGNOSIS — M25562 Pain in left knee: Secondary | ICD-10-CM | POA: Diagnosis not present

## 2016-09-24 DIAGNOSIS — Y999 Unspecified external cause status: Secondary | ICD-10-CM | POA: Insufficient documentation

## 2016-09-24 DIAGNOSIS — Z8673 Personal history of transient ischemic attack (TIA), and cerebral infarction without residual deficits: Secondary | ICD-10-CM | POA: Diagnosis not present

## 2016-09-24 DIAGNOSIS — M7989 Other specified soft tissue disorders: Secondary | ICD-10-CM | POA: Diagnosis not present

## 2016-09-24 MED ORDER — IBUPROFEN 200 MG PO TABS
600.0000 mg | ORAL_TABLET | Freq: Once | ORAL | Status: DC
  Filled 2016-09-24: qty 1

## 2016-09-24 NOTE — ED Notes (Signed)
VSS. Pt was not willing to stay to be treated.  And refused to sign discharge.

## 2016-09-24 NOTE — ED Notes (Signed)
This nurse was just informed that the pt's family had followed Provider [facility] into another pt's room demanding pain MX. This RN tried to give pain Mx to pt.  Pt's family was sitting in front of the computer.  I asked the family member to excuse himself from in front of the computer because I need use it. Family member told this RN "you have an attitude". As I was accessing the computer pt family continued to complain about inappropriate service. I told the family that it is inappropriate to follow the provider into other pt's room. Pt family said "is that what the fat Fag told you" Pt family was implying Provider [facility]. When I told him that language would not be accepted he called this RN "a Fag as well". Security came to walk family out. Pt said she wanted to leave and was given discharge papers.  As pt was wheeled by nursing station she asked for pain Mx and this RN said no as she was not willing to stay and be treated.

## 2016-09-24 NOTE — ED Notes (Signed)
Patient transported to X-ray 

## 2016-09-24 NOTE — ED Triage Notes (Signed)
Pt. Stated, I WAS AT THE GYM  And I got on the bike, went home and it started swelling and its painful. I can't go flat footed.

## 2016-09-24 NOTE — ED Provider Notes (Signed)
[facility] DEPT Provider Note   CSN: [id number] Arrival date & time: 09/24/16  1730  By signing my name below, I, Reola Mosher, attest that this documentation has been prepared under the direction and in the presence of [facility], [place].  Electronically Signed: Reola Mosher, ED Scribe. 09/24/16. 6:44 PM.  History   Chief Complaint Chief Complaint  Patient presents with  . Knee Pain   The history is provided by the patient. No language interpreter was used.  Knee Pain   This is a new problem. The current episode started more than 2 days ago. The problem occurs constantly. The problem has been gradually worsening. The pain is present in the left knee. The pain is at a severity of 9/10. Pertinent negatives include no numbness. The symptoms are aggravated by standing. She has tried OTC pain medications for the symptoms. The treatment provided no relief. Family history is significant for no rheumatoid arthritis and no gout.    HPI Comments: Amanda Saunders is a 61 y.o. female with a h/o HTN, who presents to the Emergency Department complaining of intermittent left knee pain beginning one week ago, significantly worsening and constant today prior to arrival. Per pt, she was at the gym today one week ago using a stationary bike which is a new activity for her. For the past week since this she has had mild intermittent pains to the left knee, however, today her pain significantly worsened and was constant, and there is associated swelling to the joint, per pt. No recent trauma or injury to the knee otherwise. She has been minimally ambulatory or weight bearing to the extremity today secondary to this exacerbating her pain. She has been taking Tylenol at home without significant relief of her pain. Pt denies numbness, or any other associated symptoms.   Past Medical History:  Diagnosis Date  . Cardiomyopathy ([place])   . Hypertension   . PUD (peptic ulcer disease)   . Stroke ([place])     . Vertigo    Patient Active Problem List   Diagnosis Date Noted  . Pre-diabetes 04/03/2016  . History of CVA (cerebrovascular accident) 03/12/2016  . Chest pain at rest 03/12/2016  . Peripheral edema 03/12/2016   Past Surgical History:  Procedure Laterality Date  . BACK SURGERY    . CARPAL TUNNEL RELEASE     OB History    No data available     Home Medications    Prior to Admission medications   Medication Sig Start Date End Date Taking? Authorizing Provider  aspirin 325 MG EC tablet Take 650 mg by mouth daily as needed (for chest pain episodes).     Historical Provider, MD  aspirin 81 MG tablet Take 81 mg by mouth daily.    Historical Provider, MD  atorvastatin (LIPITOR) 10 MG tablet Take 1 tablet (10 mg total) by mouth daily. 05/15/16   Pieter Partridge, DO  diazepam (VALIUM) 5 MG tablet Take 20 - 30 minutes before scan. May repeat right before scan if needed 05/14/16   Pieter Partridge, DO  hydrochlorothiazide (HYDRODIURIL) 25 MG tablet Take 1 tablet (25 mg total) by mouth every other day. 04/04/16   Gay Filler Copland, MD  ibuprofen (ADVIL,MOTRIN) 800 MG tablet Take 1 tablet (800 mg total) by mouth every 8 (eight) hours as needed (for back pain). 04/29/16   Darreld Mclean, MD  meclizine (ANTIVERT) 25 MG tablet Take 1 tablet (25 mg total) by mouth 3 (three) times daily as  needed for dizziness. 07/08/16   Pieter Partridge, DO  metoprolol succinate (TOPROL XL) 25 MG 24 hr tablet Take 1 tablet (25 mg total) by mouth daily. 06/06/16   Lelon Perla, MD  Pyridoxine HCl (VITAMIN B-6) 500 MG tablet Take 500 mg by mouth every other day.     Historical Provider, MD  vitamin B-12 (CYANOCOBALAMIN) 500 MCG tablet Take 500 mcg by mouth every other day.     Historical Provider, MD  Vitamin D, Ergocalciferol, (DRISDOL) 50000 units CAPS capsule Take 1 capsule (50,000 Units total) by mouth every 7 (seven) days. 04/03/16   Darreld Mclean, MD   Family History Family History  Problem Relation Age of  Onset  . Hypertension Mother   . Arthritis Mother   . Heart failure Mother   . Stroke Mother   . Kidney disease Father   . Hypertension Father    Social History Social History  Substance Use Topics  . Smoking status: Former Smoker    Quit date: 07/21/2002  . Smokeless tobacco: Never Used  . Alcohol use Yes     Comment: occasional   Allergies   Codeine  Review of Systems Review of Systems  Musculoskeletal: Positive for arthralgias (left knee) and joint swelling (left knee).  Neurological: Negative for numbness.  All other systems reviewed and are negative.  Physical Exam Updated Vital Signs BP 142/85 (BP Location: Right Arm)   Pulse 68   Temp 98.6 F (37 C) (Oral)   Resp 17   Ht 5\' 3"  (1.6 m)   Wt 90.7 kg   SpO2 98%   BMI 35.43 kg/m   Physical Exam  Constitutional: She appears well-developed and well-nourished. No distress.  HENT:  Head: Normocephalic and atraumatic.  Eyes: Conjunctivae are normal.  Neck: Normal range of motion.  Cardiovascular: Normal rate.   Pulmonary/Chest: Effort normal.  Abdominal: She exhibits no distension.  Musculoskeletal: Normal range of motion. She exhibits tenderness.  Pedal pulses are two pulse on the left. Full ROM of the ankle. No calf tenderness. Mild swelling to the left knee anterior aspect. Tenderness over the medial collateral ligament and popliteal area.   Neurological: She is alert.  Skin: No pallor.  Psychiatric: She has a normal mood and affect. Her behavior is normal.  Nursing note and vitals reviewed.  ED Treatments / Results  DIAGNOSTIC STUDIES: Oxygen Saturation is 98% on RA, normal by my interpretation.  COORDINATION OF CARE: 6:40 PM-Discussed next steps with pt. Pt verbalized understanding and is agreeable with the plan.   Labs (all labs ordered are listed, but only abnormal results are displayed) Labs Reviewed - No data to display  Radiology Dg Knee Complete 4 Views Left  Result Date: 09/24/2016 CLINICAL  DATA:  Anterior and posterior knee pain with swelling EXAM: LEFT KNEE - COMPLETE 4+ VIEW COMPARISON:  None. FINDINGS: No fracture or malalignment. Mild patellofemoral degenerative changes with bony spurring. Mild narrowing of the medial compartment with spurring. Trace suprapatellar effusion IMPRESSION: 1. Mild degenerative changes. No acute osseous abnormality. Trace suprapatellar effusion. Electronically Signed   By: Donavan Foil M.D.   On: 09/24/2016 19:06    Procedures Procedures   Medications Ordered in ED Medications - No data to display  Initial Impression / Assessment and Plan / ED Course  I have reviewed the triage vital signs and the nursing notes.  Pertinent imaging results that were available during my care of the patient were reviewed by me and considered in my medical decision  making (see chart for details).    This is a 61yo female with a h/o HTN who presents into the ED with left knee pain following using a stationary bike one week ago. Pain was intermittent and more mild over the past week since this activity, however, today her pain has worsened throughout the day and been constant, unrelieved with Tylenol at home.    7:34 PM Patient XR negative for obvious fracture, dislocation, or other bony abnormalities. Pain managed in ED. Pt advised to follow up with orthopedics if symptoms persist for possibility of missed fracture diagnosis. Patient given knee sleeve while in ED, conservative therapy recommended and discussed. Patient will be d/c home. Pt is comfortable with above plan and is stable for discharge at this time. All questions were answered prior to disposition. Strict return precautions for f/u into the ED were discussed.  Dr. Laneta Simmers in to see the patient and discuss plan of care.   Final Clinical Impressions(s) / ED Diagnoses   Final diagnoses:  Knee strain, left, initial encounter   New Prescriptions Discharge Medication List as of 09/24/2016  8:03 PM    I  personally performed the services described in this documentation, which was scribed in my presence. The recorded information has been reviewed and is accurate.    [address] [place], [place] 09/25/16 0148    Leo Grosser, MD 09/25/16 [phone]

## 2016-09-25 ENCOUNTER — Encounter: Payer: Self-pay | Admitting: Family Medicine

## 2016-09-26 NOTE — Telephone Encounter (Signed)
Spoke with pt. Scheduled appointment for 09/29/2016 at 11:15

## 2016-09-29 ENCOUNTER — Ambulatory Visit: Payer: PPO | Admitting: Family Medicine

## 2016-09-29 ENCOUNTER — Encounter: Payer: Self-pay | Admitting: Family Medicine

## 2016-10-02 ENCOUNTER — Ambulatory Visit (INDEPENDENT_AMBULATORY_CARE_PROVIDER_SITE_OTHER): Payer: PPO | Admitting: Family Medicine

## 2016-10-02 ENCOUNTER — Encounter: Payer: Self-pay | Admitting: Family Medicine

## 2016-10-02 VITALS — BP 124/82 | HR 79 | Temp 97.9°F | Ht 63.0 in | Wt 209.2 lb

## 2016-10-02 DIAGNOSIS — M25562 Pain in left knee: Secondary | ICD-10-CM

## 2016-10-02 MED ORDER — TRAMADOL HCL 50 MG PO TABS
50.0000 mg | ORAL_TABLET | Freq: Three times a day (TID) | ORAL | 0 refills | Status: DC | PRN
Start: 2016-10-02 — End: 2016-12-11

## 2016-10-02 NOTE — Progress Notes (Signed)
Pre visit review using our clinic review tool, if applicable. No additional management support is needed unless otherwise documented below in the visit note. 

## 2016-10-02 NOTE — Patient Instructions (Signed)
It was good to see you today- I am sorry that your knee is hurt!   Continue to use your knee sleeve as needed for support Ice and elevation can help Use the crutches as needed to take the weight off your leg We are going to set you up to see orthopedics asap; take your old MRI with you when you go to your appt Use the ibuprofen as needed for pain, you can also use the tramadol if needed for more severe pain. The tramadol can make you feel drowsy!

## 2016-10-02 NOTE — Progress Notes (Signed)
[facility] [address] [place], [place] [address] [phone]  Date:  10/02/2016   Name:  Amanda Saunders   DOB:  1956/01/16   MRN:  [id number]  PCP:  Lamar Blinks, MD    Chief Complaint: Follow-up (Pt here for ER f/u. Pt was seen in the ER on 09/24/16 for knee strain. Pt states swelling has gone down but pain is still present. )   History of Present Illness:  Amanda Saunders is a 61 y.o. very pleasant female patient who presents with the following:  Last seen by myself in September of last year for a CPE  She has LEFT knee pain; recently she was working out doing cardio at the gym. She began exercising and was going between the treadmill and the bike.  She did not have any acute pain or obvious injury during her work-out,but a couple of days later she awoke and the knee was swollen.  She could not extend it completely and it really hurt -this is when she went to the ER on 3/7  She did have an MRI of her left knee about 5 years ago and was told that she "did not have any cartilage in her knee, but they did not need to operate."  This was done in [place]  The right knee is ok  She is using ibuprofen 800 for her pain- this is helping her some.   She is allergic to codeine but her allergy is just nausea and vomiting - she is able to tolerate other narcotic medications.  Her knee pain is waking her up at night and can be severe She is otherwise ok today  Dg Knee Complete 4 Views Left  Result Date: 09/24/2016 CLINICAL DATA:  Anterior and posterior knee pain with swelling EXAM: LEFT KNEE - COMPLETE 4+ VIEW COMPARISON:  None. FINDINGS: No fracture or malalignment. Mild patellofemoral degenerative changes with bony spurring. Mild narrowing of the medial compartment with spurring. Trace suprapatellar effusion IMPRESSION: 1. Mild degenerative changes. No acute osseous abnormality. Trace suprapatellar effusion. Electronically  Signed   By: Donavan Foil M.D.   On: 09/24/2016 19:06     Patient Active Problem List   Diagnosis Date Noted  . Pre-diabetes 04/03/2016  . History of CVA (cerebrovascular accident) 03/12/2016  . Chest pain at rest 03/12/2016  . Peripheral edema 03/12/2016    Past Medical History:  Diagnosis Date  . Cardiomyopathy ([place])   . Hypertension   . PUD (peptic ulcer disease)   . Stroke ([place])   . Vertigo     Past Surgical History:  Procedure Laterality Date  . BACK SURGERY    . CARPAL TUNNEL RELEASE      Social History  Substance Use Topics  . Smoking status: Former Smoker    Quit date: 07/21/2002  . Smokeless tobacco: Never Used  . Alcohol use Yes     Comment: occasional    Family History  Problem Relation Age of Onset  . Hypertension Mother   . Arthritis Mother   . Heart failure Mother   . Stroke Mother   . Kidney disease Father   . Hypertension Father     Allergies  Allergen Reactions  . Codeine Nausea And Vomiting    Medication list has been reviewed and updated.  Current Outpatient Prescriptions on File Prior to Visit  Medication Sig Dispense Refill  . aspirin 325 MG EC tablet Take 650 mg by mouth  daily as needed (for chest pain episodes).     Marland Kitchen aspirin 81 MG tablet Take 81 mg by mouth daily.    Marland Kitchen atorvastatin (LIPITOR) 10 MG tablet Take 1 tablet (10 mg total) by mouth daily. 30 tablet 3  . diazepam (VALIUM) 5 MG tablet Take 20 - 30 minutes before scan. May repeat right before scan if needed 2 tablet 0  . hydrochlorothiazide (HYDRODIURIL) 25 MG tablet Take 1 tablet (25 mg total) by mouth every other day. 90 tablet 3  . ibuprofen (ADVIL,MOTRIN) 800 MG tablet Take 1 tablet (800 mg total) by mouth every 8 (eight) hours as needed (for back pain). 30 tablet 1  . meclizine (ANTIVERT) 25 MG tablet Take 1 tablet (25 mg total) by mouth 3 (three) times daily as needed for dizziness. 30 tablet 2  . metoprolol succinate (TOPROL XL) 25 MG 24 hr tablet Take 1 tablet (25 mg  total) by mouth daily. 90 tablet 3  . Pyridoxine HCl (VITAMIN B-6) 500 MG tablet Take 500 mg by mouth every other day.     . vitamin B-12 (CYANOCOBALAMIN) 500 MCG tablet Take 500 mcg by mouth every other day.     . Vitamin D, Ergocalciferol, (DRISDOL) 50000 units CAPS capsule Take 1 capsule (50,000 Units total) by mouth every 7 (seven) days. 12 capsule 0   No current facility-administered medications on file prior to visit.     Review of Systems:  As per HPI- otherwise negative. No fever, chills, CP, SOB, rash  Physical Examination: Vitals:   10/02/16 0934  BP: 124/82  Pulse: 79  Temp: 97.9 F (36.6 C)   Vitals:   10/02/16 0934  Weight: 209 lb 3.2 oz (94.9 kg)  Height: 5\' 3"  (1.6 m)   Body mass index is 37.06 kg/m. Ideal Body Weight: Weight in (lb) to have BMI = 25: 140.8  GEN: WDWN, NAD, Non-toxic, A & O x 3, obese, otherwise looks well HEENT: Atraumatic, Normocephalic. Neck supple. No masses, No LAD. Ears and Nose: No external deformity. CV: RRR, No M/G/R. No JVD. No thrill. No extra heart sounds. PULM: CTA B, no wheezes, crackles, rhonchi. No retractions. No resp. distress. No accessory muscle use. EXTR: No c/c/e NEURO antalgic- favoring left leg PSYCH: Normally interactive. Conversant. Not depressed or anxious appearing.  Calm demeanor.  Left leg: no effusion or apparent swelling at this time. No redness, heat, or wound.  She has tenderness at the medial joint line - severe.  Mild tenderness with full flexion of the knee, she cannot extend the knee fully due to pain; missing about 5 degrees of extension  Fit with crutches for her height  Assessment and Plan: Acute pain of left knee - Plan: traMADol (ULTRAM) 50 MG tablet, Ambulatory referral to Orthopedic Surgery  Here today with left knee pain- suspect internal derangement.  Will refer to ortho asap Crutches, conservative supportive care rx for tramadol prn See patient instructions for more details.      Signed Lamar Blinks, MD

## 2016-10-07 ENCOUNTER — Ambulatory Visit (INDEPENDENT_AMBULATORY_CARE_PROVIDER_SITE_OTHER): Payer: PPO | Admitting: Orthopaedic Surgery

## 2016-10-07 ENCOUNTER — Encounter (INDEPENDENT_AMBULATORY_CARE_PROVIDER_SITE_OTHER): Payer: Self-pay | Admitting: Orthopaedic Surgery

## 2016-10-07 VITALS — BP 145/78 | HR 60 | Resp 14 | Ht 63.0 in | Wt 225.0 lb

## 2016-10-07 DIAGNOSIS — M25562 Pain in left knee: Secondary | ICD-10-CM

## 2016-10-07 NOTE — Progress Notes (Signed)
Office Visit Note   Patient: Amanda Saunders           Date of Birth: 1955-09-26           MRN: [id number] Visit Date: 10/07/2016              Requested by: Darreld Mclean, MD [address] [place], [place] [address] PCP: Lamar Blinks, MD   Assessment & Plan: Visit Diagnoses:  1. Acute pain of left knee   2.      Rule out left medial meniscal tear  Plan:  #1: Corticosteroid injection was instilled into the left knee. Minimal improvement at the time in the office. #2: If this is not beneficial she'll give [place] a call and schedule her for an MRI scan of the left knee to rule out meniscal tear versus DJD  Follow-Up Instructions: Return if symptoms worsen or fail to improve.   Orders:  No orders of the defined types were placed in this encounter.  No orders of the defined types were placed in this encounter.     Procedures: No procedures performed   Clinical Data: No additional findings.   Subjective: Chief Complaint  Patient presents with  . Left Knee - Pain    Amanda Saunders is a 61 y.o. female patient who presents with LEFT knee pain; recently she was working out doing cardio at the gym. She began exercising and was going between the treadmill and the bike.  She did not have any acute pain or obvious injury during her work-out,but a couple of days later she awoke and the knee was swollen.  She could not extend it completely and it really hurt -this is when she went to the ER on 3/7  She did have an MRI of her left knee about 5 years ago and was told that she "did not have any cartilage in her knee, but they did not need to operate."  This was done in [place]     Review of Systems  Constitutional: Negative.   HENT: Negative.   Respiratory: Negative.   Cardiovascular: Negative.   Gastrointestinal: Negative.   Genitourinary: Negative.   Skin: Negative.   Neurological: Negative.   Hematological: Negative.   Psychiatric/Behavioral:  Negative.      Objective: Vital Signs: BP (!) 145/78   Pulse 60   Resp 14   Ht 5\' 3"  (1.6 m)   Wt 225 lb (102.1 kg)   BMI 39.86 kg/m   Physical Exam  Constitutional: She is oriented to person, place, and time. She appears well-developed and well-nourished.  HENT:  Head: Normocephalic and atraumatic.  Eyes: EOM are normal. Pupils are equal, round, and reactive to light.  Pulmonary/Chest: Effort normal.  Musculoskeletal:       Left knee: She exhibits effusion (Trace to 1+).  Neurological: She is alert and oriented to person, place, and time.  Skin: Skin is warm and dry.  Psychiatric: She has a normal mood and affect. Her behavior is normal. Judgment and thought content normal.    Left Knee Exam   Tenderness  The patient is experiencing tenderness in the medial joint line and medial retinaculum.  Range of Motion  Extension: -5  Flexion: 100   Tests  McMurray:  Medial - positive  Lachman:  Anterior - trace     Drawer:       Anterior - 1+      Varus: positive (with a good endpoint)    Other  Erythema: absent Scars: absent Sensation: normal Pulse: present Effusion: effusion (Trace to 1+) present      Specialty Comments:  No specialty comments available.  Imaging: 4 view x-ray of the left knee reveals some medial tibial plateau spurring as well as decreased joint space of the medial knee. Some calcification in the lateral gutter of the distal lateral femoral condyle.   PMFS History: Patient Active Problem List   Diagnosis Date Noted  . Pre-diabetes 04/03/2016  . History of CVA (cerebrovascular accident) 03/12/2016  . Chest pain at rest 03/12/2016  . Peripheral edema 03/12/2016   Past Medical History:  Diagnosis Date  . Cardiomyopathy ([place])   . Hypertension   . PUD (peptic ulcer disease)   . Stroke ([place])   . Vertigo     Family History  Problem Relation Age of Onset  . Hypertension Mother   . Arthritis Mother   . Heart failure Mother   . Stroke  Mother   . Kidney disease Father   . Hypertension Father     Past Surgical History:  Procedure Laterality Date  . BACK SURGERY    . CARPAL TUNNEL RELEASE     Social History   Occupational History  .      retired   Social History Main Topics  . Smoking status: Former Smoker    Quit date: 07/21/2002  . Smokeless tobacco: Never Used  . Alcohol use Yes     Comment: occasional  . Drug use: No  . Sexual activity: Not on file

## 2016-10-13 ENCOUNTER — Telehealth: Payer: Self-pay | Admitting: Orthopedic Surgery

## 2016-10-13 ENCOUNTER — Other Ambulatory Visit (INDEPENDENT_AMBULATORY_CARE_PROVIDER_SITE_OTHER): Payer: Self-pay

## 2016-10-13 DIAGNOSIS — G8929 Other chronic pain: Secondary | ICD-10-CM

## 2016-10-13 DIAGNOSIS — M25562 Pain in left knee: Principal | ICD-10-CM

## 2016-10-13 NOTE — Telephone Encounter (Signed)
Called to verify left knee MRI

## 2016-10-13 NOTE — Telephone Encounter (Signed)
Patient was seen March 20th and states she received cortisone injection.  The injection worked for about two days, but wore off and now she has swelling and unable to straighten the leg.  She has been in the bed for the last couple of days because of her knees and gets up only when she has to.   She would like to proceed with MRI because the R knee is now starting to bother her as well.

## 2016-10-21 NOTE — Progress Notes (Signed)
Pre visit review using our clinic review tool, if applicable. No additional management support is needed unless otherwise documented below in the visit note. 

## 2016-10-21 NOTE — Progress Notes (Addendum)
Subjective:   Amanda Saunders is a 61 y.o. female who presents for an Initial Medicare Annual Wellness Visit. Here with husband.  Review of Systems    No ROS.  Medicare Wellness Visit. Cardiac Risk Factors include: advanced age (>67men, >94 women) Sleep patterns: sleeps 4.5-5 hrs/ night.  Home Safety/Smoke Alarms:  Feels safe in home. Smoke alarms in place.  Living environment; residence and Firearm Safety: Lives at home with husband and granddaughter. Lives on 2nd floor. No guns. Seat Belt Safety/Bike Helmet: Wears seat belt.   Counseling:   Eye Exam- Wears glasses for driving. Does not have [facility] doctor yet. Dental- Dentist as needed. Dental resource list provided.  Female:   Pap- Last 04/02/16:No result on file.  Normal per pt     Mammo- Last 04/19/16: BI-RADS CATEGORY  1: Negative.    Dexa scan- n/a  CCS- Last in 2012. Repeat in 10 yrs per pt.    Objective:    Today's Vitals   10/22/16 0807 10/22/16 0808  BP: 130/68   Pulse: 76   SpO2: 97%   Weight: 211 lb 9.6 oz (96 kg)   Height: 5\' 4"  (1.626 m)   PainSc:  8    Body mass index is 36.32 kg/m.   Current Medications (verified) Outpatient Encounter Prescriptions as of 10/22/2016  Medication Sig  . aspirin 325 MG EC tablet Take 650 mg by mouth daily as needed (for chest pain episodes).   Marland Kitchen aspirin 81 MG tablet Take 81 mg by mouth daily.  Marland Kitchen atorvastatin (LIPITOR) 10 MG tablet Take 1 tablet (10 mg total) by mouth daily.  . hydrochlorothiazide (HYDRODIURIL) 25 MG tablet Take 1 tablet (25 mg total) by mouth every other day.  . ibuprofen (ADVIL,MOTRIN) 800 MG tablet Take 1 tablet (800 mg total) by mouth every 8 (eight) hours as needed (for back pain).  . meclizine (ANTIVERT) 25 MG tablet Take 1 tablet (25 mg total) by mouth 3 (three) times daily as needed for dizziness.  . metoprolol succinate (TOPROL XL) 25 MG 24 hr tablet Take 1 tablet (25 mg total) by mouth daily.  . Pyridoxine HCl (VITAMIN B-6) 500 MG tablet Take 500  mg by mouth every other day.   . traMADol (ULTRAM) 50 MG tablet Take 1 tablet (50 mg total) by mouth every 8 (eight) hours as needed.  . vitamin B-12 (CYANOCOBALAMIN) 500 MCG tablet Take 500 mcg by mouth every other day.   . Vitamin D, Ergocalciferol, (DRISDOL) 50000 units CAPS capsule Take 1 capsule (50,000 Units total) by mouth every 7 (seven) days.   No facility-administered encounter medications on file as of 10/22/2016.     Allergies (verified) Codeine   History: Past Medical History:  Diagnosis Date  . Cardiomyopathy ([place])   . Hypertension   . PUD (peptic ulcer disease)   . Stroke ([place])   . Vertigo    Past Surgical History:  Procedure Laterality Date  . BACK SURGERY    . CARPAL TUNNEL RELEASE    . NECK SURGERY     2015   Family History  Problem Relation Age of Onset  . Hypertension Mother   . Arthritis Mother   . Heart failure Mother   . Stroke Mother   . Kidney disease Father   . Hypertension Father    Social History   Occupational History  .      retired   Social History Main Topics  . Smoking status: Former Smoker    Quit date:  07/21/2002  . Smokeless tobacco: Never Used  . Alcohol use Yes     Comment: occasional  . Drug use: No  . Sexual activity: Yes    Tobacco Counseling Counseling given: Not Answered   Activities of Daily Living In your present state of health, do you have any difficulty performing the following activities: 10/22/2016  Hearing? N  Vision? N  Difficulty concentrating or making decisions? N  Walking or climbing stairs? N  Dressing or bathing? N  Doing errands, shopping? N  Preparing Food and eating ? N  Using the Toilet? N  In the past six months, have you accidently leaked urine? N  Do you have problems with loss of bowel control? N  Managing your Medications? N  Managing your Finances? N  Housekeeping or managing your Housekeeping? N  Some recent data might be hidden    Immunizations and Health Maintenance Immunization  History  Administered Date(s) Administered  . Tdap 03/12/2016   Health Maintenance Due  Topic Date Due  . HIV Screening  03/21/1972    Patient Care Team: Darreld Mclean, MD as PCP - General (Family Medicine)  Indicate any recent Medical Services you may have received from other than [facility] providers in the past year (date may be approximate).     Assessment:   This is a routine wellness examination for Amanda Saunders. Physical assessment deferred to PCP.   Hearing/Vision screen No exam data present  Dietary issues and exercise activities discussed: Current Exercise Habits: The patient does not participate in regular exercise at present, Exercise limited by: orthopedic condition(s)   Diet (meal preparation, eat out, water intake, caffeinated beverages, dairy products, fruits and vegetables): well balanced  24 Hour Recall: Breakfast: Sausage, toast, egg, coffee Lunch: salad or sandwich Dinner: Meat,veg, bread Drinks at least 2 bottles per day.     Goals      Patient Stated   . <enter goal here> (pt-stated)          Lose 20 lbs     . <enter goal here> (pt-stated)          Buy new house.       Depression Screen PHQ 2/9 Scores 10/22/2016  PHQ - 2 Score 0    Fall Risk Fall Risk  10/22/2016 05/14/2016  Falls in the past year? No No    Cognitive Function: Ad8 score reviewed for issues:  Issues making decisions:no  Less interest in hobbies / activities:no  Repeats questions, stories (family complaining):no  Trouble using ordinary gadgets (microwave, computer, phone):no  Forgets the month or year: no  Mismanaging finances: no  Remembering appts:no  Daily problems with thinking and/or memory:no Ad8 score is=0    MMSE - Mini Mental State Exam 05/14/2016  Orientation to time 5  Orientation to Place 5  Registration 3  Attention/ Calculation 4  Recall 3  Language- name 2 objects 2  Language- repeat 1  Language- follow 3 step command 3  Language- read &  follow direction 1  Write a sentence 1  Copy design 1  Total score 29        Screening Tests Health Maintenance  Topic Date Due  . HIV Screening  03/21/1972  . INFLUENZA VACCINE  02/18/2017  . MAMMOGRAM  04/19/2018  . PAP SMEAR  04/03/2019  . COLONOSCOPY  09/03/2021  . TETANUS/TDAP  03/12/2026  . Hepatitis C Screening  Completed      Plan:     Follow up with Dr.Copland as scheduled.  Continue to eat heart healthy diet (full of fruits, vegetables, whole grains, lean protein, water--limit salt, fat, and sugar intake) and increase physical activity as tolerated.  Take medications as discussed.  During the course of the visit, Amanda Saunders was educated and counseled about the following appropriate screening and preventive services:   Vaccines to include Pneumoccal, Influenza, Td, HCV  Cardiovascular disease screening  Colorectal cancer screening  Bone density screening  Diabetes screening  Glaucoma screening  Mammography/PAP  Nutrition counseling  Patient Instructions (the written plan) were given to the patient.    Amanda Saunders, [place]   10/22/2016    I have reviewed note by Ms. Ajay Strubel and agree with her documentation

## 2016-10-22 ENCOUNTER — Ambulatory Visit (INDEPENDENT_AMBULATORY_CARE_PROVIDER_SITE_OTHER): Payer: PPO | Admitting: *Deleted

## 2016-10-22 ENCOUNTER — Encounter: Payer: Self-pay | Admitting: *Deleted

## 2016-10-22 VITALS — BP 130/68 | HR 76 | Ht 64.0 in | Wt 211.6 lb

## 2016-10-22 DIAGNOSIS — Z0000 Encounter for general adult medical examination without abnormal findings: Secondary | ICD-10-CM

## 2016-10-22 NOTE — Patient Instructions (Signed)
Follow up with Dr.Copland as scheduled.  Continue to eat heart healthy diet (full of fruits, vegetables, whole grains, lean protein, water--limit salt, fat, and sugar intake) and increase physical activity as tolerated.  Take medications as discussed. Heart-Healthy Eating Plan Heart-healthy meal planning includes:  Limiting unhealthy fats.  Increasing healthy fats.  Making other small dietary changes. You may need to talk with your doctor or a diet specialist (dietitian) to create an eating plan that is right for you. What types of fat should I choose?  Choose healthy fats. These include olive oil and canola oil, flaxseeds, walnuts, almonds, and seeds.  Eat more omega-3 fats. These include salmon, mackerel, sardines, tuna, flaxseed oil, and ground flaxseeds. Try to eat fish at least twice each week.  Limit saturated fats.  Saturated fats are often found in animal products, such as meats, butter, and cream.  Plant sources of saturated fats include palm oil, palm kernel oil, and coconut oil.  Avoid foods with partially hydrogenated oils in them. These include stick margarine, some tub margarines, cookies, crackers, and other baked goods. These contain trans fats. What general guidelines do I need to follow?  Check food labels carefully. Identify foods with trans fats or high amounts of saturated fat.  Fill one half of your plate with vegetables and green salads. Eat 4-5 servings of vegetables per day. A serving of vegetables is:  1 cup of raw leafy vegetables.   cup of raw or cooked cut-up vegetables.   cup of vegetable juice.  Fill one fourth of your plate with whole grains. Look for the word "whole" as the first word in the ingredient list.  Fill one fourth of your plate with lean protein foods.  Eat 4-5 servings of fruit per day. A serving of fruit is:  One medium whole fruit.   cup of dried fruit.   cup of fresh, frozen, or canned fruit.   cup of 100% fruit  juice.  Eat more foods that contain soluble fiber. These include apples, broccoli, carrots, beans, peas, and barley. Try to get 20-30 g of fiber per day.  Eat more home-cooked food. Eat less restaurant, buffet, and fast food.  Limit or avoid alcohol.  Limit foods high in starch and sugar.  Avoid fried foods.  Avoid frying your food. Try baking, boiling, grilling, or broiling it instead. You can also reduce fat by:  Removing the skin from poultry.  Removing all visible fats from meats.  Skimming the fat off of stews, soups, and gravies before serving them.  Steaming vegetables in water or broth.  Lose weight if you are overweight.  Eat 4-5 servings of nuts, legumes, and seeds per week:  One serving of dried beans or legumes equals  cup after being cooked.  One serving of nuts equals 1 ounces.  One serving of seeds equals  ounce or one tablespoon.  You may need to keep track of how much salt or sodium you eat. This is especially true if you have high blood pressure. Talk with your doctor or dietitian to get more information. What foods can I eat? Grains  Breads, including [place], white, pita, wheat, raisin, rye, oatmeal, and [place]. Tortillas that are neither fried nor made with lard or trans fat. Low-fat rolls, including hotdog and hamburger buns and English muffins. Biscuits. Muffins. Waffles. Pancakes. Light popcorn. Whole-grain cereals. Flatbread. Melba toast. Pretzels. Breadsticks. Rusks. Low-fat snacks. Low-fat crackers, including oyster, saltine, matzo, graham, animal, and rye. Rice and pasta, including brown rice  and pastas that are made with whole wheat. Vegetables  All vegetables. Fruits  All fruits, but limit coconut. Meats and Other Protein Sources  Lean, well-trimmed beef, veal, pork, and lamb. Chicken and [place] without skin. All fish and shellfish. Wild duck, rabbit, pheasant, and venison. Egg whites or low-cholesterol egg substitutes. Dried beans, peas,  lentils, and tofu. Seeds and most nuts. Dairy  Low-fat or nonfat cheeses, including ricotta, string, and mozzarella. Skim or 1% milk that is liquid, powdered, or evaporated. Buttermilk that is made with low-fat milk. Nonfat or low-fat yogurt. Beverages  Mineral water. Diet carbonated beverages. Sweets and Desserts  Sherbets and fruit ices. Honey, jam, marmalade, jelly, and syrups. Meringues and gelatins. Pure sugar candy, such as hard candy, jelly beans, gumdrops, mints, marshmallows, and small amounts of dark chocolate. [employer] Eat all sweets and desserts in moderation. Fats and Oils  Nonhydrogenated (trans-free) margarines. Vegetable oils, including soybean, sesame, sunflower, olive, peanut, safflower, corn, canola, and cottonseed. Salad dressings or mayonnaise made with a vegetable oil. Limit added fats and oils that you use for cooking, baking, salads, and as spreads. Other  Cocoa powder. Coffee and tea. All seasonings and condiments. The items listed above may not be a complete list of recommended foods or beverages. Contact your dietitian for more options.  What foods are not recommended? Grains  Breads that are made with saturated or trans fats, oils, or whole milk. Croissants. Butter rolls. Cheese breads. Sweet rolls. Donuts. Buttered popcorn. Chow mein noodles. High-fat crackers, such as cheese or butter crackers. Meats and Other Protein Sources  Fatty meats, such as hotdogs, short ribs, sausage, spareribs, bacon, rib eye roast or steak, and mutton. High-fat deli meats, such as salami and bologna. Caviar. Domestic duck and goose. Organ meats, such as kidney, liver, sweetbreads, and heart. Dairy  Cream, sour cream, cream cheese, and creamed cottage cheese. Whole-milk cheeses, including blue (bleu), Monte[place] Jack, [place], [place], [place], [place], Swiss, cheddar, [place], and [place]. Whole or 2% milk that is liquid, evaporated, or condensed. Whole buttermilk. Cream sauce or  high-fat cheese sauce. Yogurt that is made from whole milk. Beverages  Regular sodas and juice drinks with added sugar. Sweets and Desserts  Frosting. Pudding. Cookies. Cakes other than angel food cake. Candy that has milk chocolate or white chocolate, hydrogenated fat, butter, coconut, or unknown ingredients. Buttered syrups. Full-fat ice cream or ice cream drinks. Fats and Oils  Gravy that has suet, meat fat, or shortening. Cocoa butter, hydrogenated oils, palm oil, coconut oil, palm kernel oil. These can often be found in baked products, candy, fried foods, nondairy creamers, and whipped toppings. Solid fats and shortenings, including bacon fat, salt pork, lard, and butter. Nondairy cream substitutes, such as coffee creamers and sour cream substitutes. Salad dressings that are made of unknown oils, cheese, or sour cream. The items listed above may not be a complete list of foods and beverages to avoid. Contact your dietitian for more information.  This information is not intended to replace advice given to you by your health care provider. Make sure you discuss any questions you have with your health care provider. Document Released: 01/06/2012 Document Revised: 12/13/2015 Document Reviewed: 12/29/2013 Elsevier Interactive Patient Education  2017 [employer].

## 2016-10-28 ENCOUNTER — Ambulatory Visit
Admission: RE | Admit: 2016-10-28 | Discharge: 2016-10-28 | Disposition: A | Discharge status: Home / Self Care | Payer: PPO | Source: Ambulatory Visit | Attending: Orthopaedic Surgery | Admitting: Orthopaedic Surgery

## 2016-10-28 DIAGNOSIS — S83242A Other tear of medial meniscus, current injury, left knee, initial encounter: Secondary | ICD-10-CM | POA: Diagnosis not present

## 2016-10-28 DIAGNOSIS — G8929 Other chronic pain: Secondary | ICD-10-CM

## 2016-10-28 DIAGNOSIS — M25562 Pain in left knee: Principal | ICD-10-CM

## 2016-11-03 ENCOUNTER — Ambulatory Visit (INDEPENDENT_AMBULATORY_CARE_PROVIDER_SITE_OTHER): Payer: PPO | Admitting: Family Medicine

## 2016-11-03 ENCOUNTER — Encounter: Payer: Self-pay | Admitting: Family Medicine

## 2016-11-03 ENCOUNTER — Ambulatory Visit (INDEPENDENT_AMBULATORY_CARE_PROVIDER_SITE_OTHER): Payer: PPO | Admitting: Orthopaedic Surgery

## 2016-11-03 ENCOUNTER — Encounter (INDEPENDENT_AMBULATORY_CARE_PROVIDER_SITE_OTHER): Payer: Self-pay | Admitting: Orthopaedic Surgery

## 2016-11-03 VITALS — BP 118/84 | HR 75 | Temp 97.9°F | Ht 63.0 in | Wt 206.0 lb

## 2016-11-03 VITALS — BP 134/68 | HR 73 | Ht 64.0 in | Wt 206.0 lb

## 2016-11-03 DIAGNOSIS — M25562 Pain in left knee: Secondary | ICD-10-CM | POA: Diagnosis not present

## 2016-11-03 DIAGNOSIS — R7303 Prediabetes: Secondary | ICD-10-CM | POA: Diagnosis not present

## 2016-11-03 DIAGNOSIS — I10 Essential (primary) hypertension: Secondary | ICD-10-CM | POA: Diagnosis not present

## 2016-11-03 DIAGNOSIS — G8929 Other chronic pain: Secondary | ICD-10-CM

## 2016-11-03 DIAGNOSIS — G5701 Lesion of sciatic nerve, right lower limb: Secondary | ICD-10-CM

## 2016-11-03 DIAGNOSIS — Z5181 Encounter for therapeutic drug level monitoring: Secondary | ICD-10-CM | POA: Diagnosis not present

## 2016-11-03 DIAGNOSIS — R609 Edema, unspecified: Secondary | ICD-10-CM

## 2016-11-03 LAB — COMPREHENSIVE METABOLIC PANEL
ALBUMIN: 4.1 g/dL (ref 3.5–5.2)
ALK PHOS: 62 U/L (ref 39–117)
ALT: 37 U/L — ABNORMAL HIGH (ref 0–35)
AST: 27 U/L (ref 0–37)
BUN: 12 mg/dL (ref 6–23)
CO2: 29 mEq/L (ref 19–32)
Calcium: 9.1 mg/dL (ref 8.4–10.5)
Chloride: 103 mEq/L (ref 96–112)
Creatinine, Ser: 0.84 mg/dL (ref 0.40–1.20)
GFR: 89.06 mL/min (ref >60.00)
GLUCOSE: 116 mg/dL — AB (ref 70–99)
POTASSIUM: 3.4 meq/L — AB (ref 3.5–5.1)
Sodium: 141 mEq/L (ref 135–145)
TOTAL PROTEIN: 7 g/dL (ref 6.0–8.3)
Total Bilirubin: 0.3 mg/dL (ref 0.2–1.2)

## 2016-11-03 LAB — CBC
HEMATOCRIT: 37 % (ref 36.0–46.0)
HEMOGLOBIN: 12.5 g/dL (ref 12.0–15.0)
MCHC: 33.8 g/dL (ref 30.0–36.0)
MCV: 86.7 fl (ref 78.0–100.0)
Platelets: 286 10*3/uL (ref 150.0–400.0)
RBC: 4.27 Mil/uL (ref 3.87–5.11)
RDW: 14.7 % (ref 11.5–15.5)
WBC: 8.7 10*3/uL (ref 4.0–10.5)

## 2016-11-03 LAB — HEMOGLOBIN A1C: HEMOGLOBIN A1C: 6.5 % (ref 4.6–6.5)

## 2016-11-03 NOTE — Progress Notes (Signed)
Office Visit Note   Patient: Amanda Saunders           Date of Birth: Mar 08, 1956           MRN: [id number] Visit Date: 11/03/2016              Requested by: Darreld Mclean, MD [address] [place], [place] [address] PCP: Lamar Blinks, MD   Assessment & Plan: Visit Diagnoses:  1. Acute pain of left knee   tear medial meniscus with tricompartmental OA  Plan: long discussion re MRI findings-will schedule arthroscopy.Mrs. Rupnow has a combination of findings. There is even a possibility of a small loose body. She fully realizes that arthroscopy may not be a complete answer because of the arthritis but she like to proceed with a long discussion regarding the surgery the findings of what she might expect over time.  Follow-Up Instructions: Return will schedule surgery.   Orders:  No orders of the defined types were placed in this encounter.  No orders of the defined types were placed in this encounter.     Procedures: No procedures performed   Clinical Data: No additional findings.   Subjective: Chief Complaint  Patient presents with  . Left Knee - Results    [name] is a 61 y.o. very pleasant female patient who presents with left knee pain, MRI results   [name] has persistent pain along the medial aspect predominantly of her left knee. She did have an MRI scan revealing with a meniscal capsular separation of the medial meniscus associated with a longitudinal tear in the posterior horn. She has mild to moderate degenerative changes without focal defects in the medial compartment. There is also an ovoid fragment measuring 1 cm x 2 cm x 0.5 cm possibly consistent with a loose body.  HPI  Review of Systems   Objective: Vital Signs: BP 134/68   Pulse 73   Ht 5\' 4"  (1.626 m)   Wt 206 lb (93.4 kg)   BMI 35.36 kg/m   Physical Exam  Ortho Exam left knee exam without effusion. Diffuse tenderness along the medial compartment  but predominantly posteriorly. No popping or clicking. No calf pain. Full extension and flexed over 105 without instability. Does walk with a limp  Specialty Comments:  No specialty comments available.  Imaging: No results found.   PMFS History: Patient Active Problem List   Diagnosis Date Noted  . Pre-diabetes 04/03/2016  . History of CVA (cerebrovascular accident) 03/12/2016  . Chest pain at rest 03/12/2016  . Peripheral edema 03/12/2016   Past Medical History:  Diagnosis Date  . Cardiomyopathy ([place])   . Hypertension   . PUD (peptic ulcer disease)   . Stroke ([place])   . Vertigo     Family History  Problem Relation Age of Onset  . Hypertension Mother   . Arthritis Mother   . Heart failure Mother   . Stroke Mother   . Kidney disease Father   . Hypertension Father     Past Surgical History:  Procedure Laterality Date  . BACK SURGERY    . CARPAL TUNNEL RELEASE    . NECK SURGERY     2015   Social History   Occupational History  .      retired   Social History Main Topics  . Smoking status: Former Smoker    Quit date: 07/21/2002  . Smokeless tobacco: Never Used  . Alcohol use Yes     Comment:  occasional  . Drug use: No  . Sexual activity: Yes

## 2016-11-03 NOTE — Patient Instructions (Signed)
It was nice to see you today- best of luck with your knee.  I suspect that your orthopedist will recommend surgery for you We will check on your labs today and I will be in touch with your results asap Your blood pressure looks fine.  I will also check on your blood sugar, liver, kidneys and electrolytes  Please try some of the stretches I gave you for your right buttock- I suspect that your piriformis muscle is strained due to compensating for your left knee

## 2016-11-03 NOTE — Progress Notes (Addendum)
[place] at [facility] [address] [place], [place] [address] [phone]  Date:  11/03/2016   Name:  Amanda Saunders   DOB:  Aug 25, 1955   MRN:  [id number]  PCP:  Lamar Blinks, MD    Chief Complaint: Follow-up (Pt here to f/u on left knee. )   History of Present Illness:  Amanda Saunders is a 61 y.o. very pleasant female patient who presents with the following:  History of CVA, pre-diabetes. Here about a month ago with a knee injury; her MRI was done last week.  Went over her MRI results briefly with her today- She is seeing Dr. Durward Fortes later on today She had her wellness visit last couple of weeks She is feeling well except for her knee.    She has tramadol and ibuprofen 800 mg that she can use.    Since she hurt her left knee, she has noted some pain in her right buttock that goes down the right lateral thigh.  The pain is not in her back.  No numbness or weakness, no difficulty controlling her bowels or bladder.  She thinks this pain is due to altering her gait to compensate for her knee  Would like to follow-up on her pre-diabetes and monitor her CBC and CMP today  Lab Results  Component Value Date   HGBA1C 6.1 04/02/2016   BP Readings from Last 3 Encounters:  11/03/16 118/84  10/22/16 130/68  10/07/16 (!) 145/78     Patient Active Problem List   Diagnosis Date Noted  . Pre-diabetes 04/03/2016  . History of CVA (cerebrovascular accident) 03/12/2016  . Chest pain at rest 03/12/2016  . Peripheral edema 03/12/2016    Past Medical History:  Diagnosis Date  . Cardiomyopathy ([place])   . Hypertension   . PUD (peptic ulcer disease)   . Stroke ([place])   . Vertigo     Past Surgical History:  Procedure Laterality Date  . BACK SURGERY    . CARPAL TUNNEL RELEASE    . NECK SURGERY     2015    Social History  Substance Use Topics  . Smoking status: Former Smoker    Quit date: 07/21/2002  . Smokeless  tobacco: Never Used  . Alcohol use Yes     Comment: occasional    Family History  Problem Relation Age of Onset  . Hypertension Mother   . Arthritis Mother   . Heart failure Mother   . Stroke Mother   . Kidney disease Father   . Hypertension Father     Allergies  Allergen Reactions  . Codeine Nausea And Vomiting    Medication list has been reviewed and updated.  Current Outpatient Prescriptions on File Prior to Visit  Medication Sig Dispense Refill  . aspirin 325 MG EC tablet Take 650 mg by mouth daily as needed (for chest pain episodes).     Marland Kitchen aspirin 81 MG tablet Take 81 mg by mouth daily.    Marland Kitchen atorvastatin (LIPITOR) 10 MG tablet Take 1 tablet (10 mg total) by mouth daily. 30 tablet 3  . hydrochlorothiazide (HYDRODIURIL) 25 MG tablet Take 1 tablet (25 mg total) by mouth every other day. 90 tablet 3  . ibuprofen (ADVIL,MOTRIN) 800 MG tablet Take 1 tablet (800 mg total) by mouth every 8 (eight) hours as needed (for back pain). 30 tablet 1  . meclizine (ANTIVERT) 25 MG tablet Take 1 tablet (25 mg total) by mouth 3 (three)  times daily as needed for dizziness. 30 tablet 2  . metoprolol succinate (TOPROL XL) 25 MG 24 hr tablet Take 1 tablet (25 mg total) by mouth daily. 90 tablet 3  . Pyridoxine HCl (VITAMIN B-6) 500 MG tablet Take 500 mg by mouth every other day.     . traMADol (ULTRAM) 50 MG tablet Take 1 tablet (50 mg total) by mouth every 8 (eight) hours as needed. 30 tablet 0  . vitamin B-12 (CYANOCOBALAMIN) 500 MCG tablet Take 500 mcg by mouth every other day.     . Vitamin D, Ergocalciferol, (DRISDOL) 50000 units CAPS capsule Take 1 capsule (50,000 Units total) by mouth every 7 (seven) days. 12 capsule 0   No current facility-administered medications on file prior to visit.     Review of Systems:  As per HPI- otherwise negative.   Physical Examination: Vitals:   11/03/16 0921  BP: 118/84  Pulse: 75  Temp: 97.9 F (36.6 C)   Vitals:   11/03/16 0921  Weight: 206  lb (93.4 kg)  Height: 5\' 3"  (1.6 m)   Body mass index is 36.49 kg/m. Ideal Body Weight: Weight in (lb) to have BMI = 25: 140.8  GEN: WDWN, NAD, Non-toxic, A & O x 3, looks well, overweight HEENT: Atraumatic, Normocephalic. Neck supple. No masses, No LAD. Ears and Nose: No external deformity. CV: RRR, No M/G/R. No JVD. No thrill. No extra heart sounds. PULM: CTA B, no wheezes, crackles, rhonchi. No retractions. No resp. distress. No accessory muscle use. ABD: S, NT, ND, +BS. No rebound. No HSM. EXTR: No c/c/e NEURO favoring left knee considerably PSYCH: Normally interactive. Conversant. Not depressed or anxious appearing.  Calm demeanor.  Tender over right sciatic notch.  Normal BLE strength and sensation   Assessment and Plan: Piriformis syndrome of right side  Pre-diabetes - Plan: Hemoglobin A1c  Chronic pain of left knee  Medication monitoring encounter - Plan: CBC, Comprehensive metabolic panel  Peripheral edema - Plan: Comprehensive metabolic panel  Essential hypertension  Here today to discuss a few concerns Labs pending as above Gave a hand- out with stretches for piriformis syndrome- suspect favoring her knee has caused spasm of the right piraformis  Will plan further follow- up pending labs.   Signed Lamar Blinks, MD  Received labs as below- message to pt Your blood count looks fine Your metabolic profile is normal except for minimally low potassium.  We will keep an eye on this.   Your A1c (average blood sugar over the previous 3 months) is now consistent with diabetes.  We will want to confirm this in a few months before we confirm a diagnosis of diabetes however.  In the meantime you do not need to start diabetes medication, but you do need to work on weight loss!  Losing even a few pounds can really improve your blood sugar.  Please work on this and see me in about 4 months to follow-up.   JC  Results for orders placed or performed in visit on 11/03/16   CBC  Result Value Ref Range   WBC 8.7 4.0 - 10.5 K/uL   RBC 4.27 3.87 - 5.11 Mil/uL   Platelets 286.0 150.0 - 400.0 K/uL   Hemoglobin 12.5 12.0 - 15.0 g/dL   HCT 37.0 36.0 - 46.0 %   MCV 86.7 78.0 - 100.0 fl   MCHC 33.8 30.0 - 36.0 g/dL   RDW 14.7 11.5 - 15.5 %  Comprehensive metabolic panel  Result Value Ref Range  Sodium 141 135 - 145 mEq/L   Potassium 3.4 (L) 3.5 - 5.1 mEq/L   Chloride 103 96 - 112 mEq/L   CO2 29 19 - 32 mEq/L   Glucose, Bld 116 (H) 70 - 99 mg/dL   BUN 12 6 - 23 mg/dL   Creatinine, Ser 0.84 0.40 - 1.20 mg/dL   Total Bilirubin 0.3 0.2 - 1.2 mg/dL   Alkaline Phosphatase 62 39 - 117 U/L   AST 27 0 - 37 U/L   ALT 37 (H) 0 - 35 U/L   Total Protein 7.0 6.0 - 8.3 g/dL   Albumin 4.1 3.5 - 5.2 g/dL   Calcium 9.1 8.4 - 10.5 mg/dL   GFR 89.06 >60.00 mL/min  Hemoglobin A1c  Result Value Ref Range   Hgb A1c MFr Bld 6.5 4.6 - 6.5 %

## 2016-11-06 ENCOUNTER — Telehealth (INDEPENDENT_AMBULATORY_CARE_PROVIDER_SITE_OTHER): Payer: Self-pay | Admitting: Orthopaedic Surgery

## 2016-11-06 NOTE — Telephone Encounter (Signed)
1 WEEK

## 2016-11-06 NOTE — Telephone Encounter (Signed)
Please advise 

## 2016-11-06 NOTE — Telephone Encounter (Signed)
The surgical center emailed me stating this pt takes 81mg  aspirin daily for history of a stroke and to please advise pt of when to stop the aspirin before surgery.

## 2016-11-10 ENCOUNTER — Inpatient Hospital Stay (INDEPENDENT_AMBULATORY_CARE_PROVIDER_SITE_OTHER): Payer: PPO | Admitting: Orthopaedic Surgery

## 2016-11-18 ENCOUNTER — Encounter: Payer: PPO | Admitting: Psychology

## 2016-11-20 DIAGNOSIS — M659 Synovitis and tenosynovitis, unspecified: Secondary | ICD-10-CM | POA: Diagnosis not present

## 2016-11-20 DIAGNOSIS — M23331 Other meniscus derangements, other medial meniscus, right knee: Secondary | ICD-10-CM | POA: Diagnosis not present

## 2016-11-20 DIAGNOSIS — M23222 Derangement of posterior horn of medial meniscus due to old tear or injury, left knee: Secondary | ICD-10-CM | POA: Diagnosis not present

## 2016-11-20 DIAGNOSIS — G8918 Other acute postprocedural pain: Secondary | ICD-10-CM | POA: Diagnosis not present

## 2016-11-20 DIAGNOSIS — M94262 Chondromalacia, left knee: Secondary | ICD-10-CM | POA: Diagnosis not present

## 2016-11-24 ENCOUNTER — Encounter (INDEPENDENT_AMBULATORY_CARE_PROVIDER_SITE_OTHER): Payer: Self-pay | Admitting: Orthopaedic Surgery

## 2016-11-24 ENCOUNTER — Ambulatory Visit (INDEPENDENT_AMBULATORY_CARE_PROVIDER_SITE_OTHER): Payer: PPO | Admitting: Orthopaedic Surgery

## 2016-11-24 VITALS — BP 126/80 | HR 98

## 2016-11-24 DIAGNOSIS — M25562 Pain in left knee: Secondary | ICD-10-CM

## 2016-11-24 DIAGNOSIS — G8929 Other chronic pain: Secondary | ICD-10-CM

## 2016-11-24 NOTE — Progress Notes (Signed)
   Office Visit Note   Patient: Amanda Saunders           Date of Birth: 1956/05/03           MRN: [id number] Visit Date: 11/24/2016              Requested by: Darreld Mclean, MD [address] [place], [place] [address] PCP: Darreld Mclean, MD   Assessment & Plan: Visit Diagnoses:  1. Chronic pain of left knee    4 days status post left knee arthroscopy with a partial medial meniscectomy. There were some areas of chondromalacia patella and the medial compartment. Doing well at this point without complication Plan: Weightbearing as tolerated with or without crutches. Work on range of motion exercises. She does not work so there is no issue and return to work. We'll consider physical therapy when she returns in 2 weeks  Follow-Up Instructions: Return in about 2 weeks (around 12/08/2016).   Orders:  No orders of the defined types were placed in this encounter.  No orders of the defined types were placed in this encounter.     Procedures: No procedures performed   Clinical Data: No additional findings.   Subjective: Chief Complaint  Patient presents with  . Left Knee - Routine Post Op  . Routine Post Op    Left knee arthoscopy last thursday. using crutches. posterior calf pain. Dilaudid is not helping with pain, only put her to sleep.  No history of shortness of breath chest pain or calf discomfort  HPI  Review of Systems   Objective: Vital Signs: BP 126/80   Pulse 98   Physical Exam  Ortho Exam left knee exam with arthroscopic portals healing nicely. No calf pain. No swelling distally. Full extension with small effusion and about 95 of flexion. Oozing crutches.  Specialty Comments:  No specialty comments available.  Imaging: No results found.   PMFS History: Patient Active Problem List   Diagnosis Date Noted  . Pre-diabetes 04/03/2016  . History of CVA (cerebrovascular accident) 03/12/2016  . Chest pain at rest 03/12/2016  .  Peripheral edema 03/12/2016   Past Medical History:  Diagnosis Date  . Cardiomyopathy ([place])   . Hypertension   . PUD (peptic ulcer disease)   . Stroke ([place])   . Vertigo     Family History  Problem Relation Age of Onset  . Hypertension Mother   . Arthritis Mother   . Heart failure Mother   . Stroke Mother   . Kidney disease Father   . Hypertension Father     Past Surgical History:  Procedure Laterality Date  . BACK SURGERY    . CARPAL TUNNEL RELEASE    . NECK SURGERY     2015   Social History   Occupational History  .      retired   Social History Main Topics  . Smoking status: Former Smoker    Quit date: 07/21/2002  . Smokeless tobacco: Never Used  . Alcohol use Yes     Comment: occasional  . Drug use: No  . Sexual activity: Yes     Garald Balding, MD   Note - This record has been created using [employer].  Chart creation errors have been sought, but may not always  have been located. Such creation errors do not reflect on  the standard of medical care.

## 2016-12-04 ENCOUNTER — Encounter: Payer: Self-pay | Admitting: Family Medicine

## 2016-12-04 ENCOUNTER — Other Ambulatory Visit: Payer: Self-pay | Admitting: Family Medicine

## 2016-12-05 ENCOUNTER — Other Ambulatory Visit: Payer: Self-pay | Admitting: Emergency Medicine

## 2016-12-05 MED ORDER — IBUPROFEN 800 MG PO TABS: 800.0000 mg | ORAL_TABLET | Freq: Three times a day (TID) | ORAL | 1 refills | Status: DC | PRN

## 2016-12-08 ENCOUNTER — Ambulatory Visit (INDEPENDENT_AMBULATORY_CARE_PROVIDER_SITE_OTHER): Payer: PPO | Admitting: Orthopaedic Surgery

## 2016-12-08 ENCOUNTER — Encounter (INDEPENDENT_AMBULATORY_CARE_PROVIDER_SITE_OTHER): Payer: Self-pay | Admitting: Orthopaedic Surgery

## 2016-12-08 VITALS — BP 124/77 | HR 80 | Resp 14 | Ht 64.0 in | Wt 206.0 lb

## 2016-12-08 DIAGNOSIS — M25562 Pain in left knee: Secondary | ICD-10-CM

## 2016-12-08 DIAGNOSIS — G8929 Other chronic pain: Secondary | ICD-10-CM

## 2016-12-08 NOTE — Progress Notes (Signed)
   Office Visit Note   Patient: Amanda Saunders           Date of Birth: 03-23-1956           MRN: [id number] Visit Date: 12/08/2016              Requested by: Darreld Mclean, MD [address] [place], [place] [address] PCP: Darreld Mclean, MD   Assessment & Plan: Visit Diagnoses:  1. Chronic pain of left knee   Nearly 3 weeks status post left knee arthroscopy for a tear of the medial meniscus. There are also tricompartmental degenerative changes. Doing well without obvious complications  Plan: Continue present weightbearing as tolerated to begin physical therapy at [facility] facility in [name]. Return to office in 2 weeks  Follow-Up Instructions: Return in about 2 weeks (around 12/22/2016).   Orders:  No orders of the defined types were placed in this encounter.  No orders of the defined types were placed in this encounter.     Procedures: No procedures performed   Clinical Data: No additional findings.   Subjective: Chief Complaint  Patient presents with  . Left Knee - Routine Post Op    Amanda Saunders is 2 weeks status post Left knee arthroscopy. She relates she has tenderness on the interior of patella.   Had a partial medial meniscectomy. Denies fever chills shortness of breath or chest pain. No calf discomfort  HPI  Review of Systems   Objective: Vital Signs: Wt 206 lb (93.4 kg)   BMI 35.36 kg/m   Physical Exam  Ortho Exam left knee arthroscopic portals healing without problem. No calf pain. No swelling distally. Neurovascular exam intact. Full extension. 100 of flexion. Some mild medial joint pain. Specialty Comments:  No specialty comments available.  Imaging: No results found.   PMFS History: Patient Active Problem List   Diagnosis Date Noted  . Pre-diabetes 04/03/2016  . History of CVA (cerebrovascular accident) 03/12/2016  . Chest pain at rest 03/12/2016  . Peripheral edema 03/12/2016   Past Medical History:    Diagnosis Date  . Cardiomyopathy ([place])   . Hypertension   . PUD (peptic ulcer disease)   . Stroke ([place])   . Vertigo     Family History  Problem Relation Age of Onset  . Hypertension Mother   . Arthritis Mother   . Heart failure Mother   . Stroke Mother   . Kidney disease Father   . Hypertension Father     Past Surgical History:  Procedure Laterality Date  . BACK SURGERY    . CARPAL TUNNEL RELEASE    . NECK SURGERY     2015   Social History   Occupational History  .      retired   Social History Main Topics  . Smoking status: Former Smoker    Quit date: 07/21/2002  . Smokeless tobacco: Never Used  . Alcohol use Yes     Comment: occasional  . Drug use: No  . Sexual activity: Yes     Garald Balding, MD   Note - This record has been created using [employer].  Chart creation errors have been sought, but may not always  have been located. Such creation errors do not reflect on  the standard of medical care.

## 2016-12-11 ENCOUNTER — Encounter: Payer: Self-pay | Admitting: Neurology

## 2016-12-11 ENCOUNTER — Other Ambulatory Visit: Payer: Self-pay | Admitting: Family Medicine

## 2016-12-11 ENCOUNTER — Other Ambulatory Visit: Payer: Self-pay | Admitting: Emergency Medicine

## 2016-12-11 DIAGNOSIS — M25562 Pain in left knee: Secondary | ICD-10-CM

## 2016-12-11 MED ORDER — TRAMADOL HCL 50 MG PO TABS: 50.0000 mg | ORAL_TABLET | Freq: Three times a day (TID) | ORAL | 0 refills | Status: DC | PRN

## 2016-12-11 MED ORDER — ATORVASTATIN CALCIUM 10 MG PO TABS: 10.0000 mg | ORAL_TABLET | Freq: Every day | ORAL | 3 refills | Status: DC

## 2016-12-11 NOTE — Telephone Encounter (Signed)
NCCSR: - she did get 5 days of hydromorphone on 5/3 per her orhtopedist following knee surgeyr Will refill tramdol for her today to use for milder post- op pain

## 2016-12-16 ENCOUNTER — Ambulatory Visit: Payer: PPO | Admitting: Physical Therapy

## 2016-12-22 ENCOUNTER — Ambulatory Visit (INDEPENDENT_AMBULATORY_CARE_PROVIDER_SITE_OTHER): Payer: PPO | Admitting: Orthopaedic Surgery

## 2016-12-23 ENCOUNTER — Ambulatory Visit: Payer: PPO | Attending: Orthopaedic Surgery | Admitting: Physical Therapy

## 2016-12-23 DIAGNOSIS — M25662 Stiffness of left knee, not elsewhere classified: Secondary | ICD-10-CM

## 2016-12-23 DIAGNOSIS — M25562 Pain in left knee: Secondary | ICD-10-CM | POA: Diagnosis not present

## 2016-12-23 DIAGNOSIS — R2689 Other abnormalities of gait and mobility: Secondary | ICD-10-CM | POA: Diagnosis not present

## 2016-12-23 DIAGNOSIS — M6281 Muscle weakness (generalized): Secondary | ICD-10-CM

## 2016-12-23 DIAGNOSIS — R262 Difficulty in walking, not elsewhere classified: Secondary | ICD-10-CM

## 2016-12-23 NOTE — Patient Instructions (Signed)
Hamstring Step 2   Left foot relaxed, knee straight, other leg bent, foot flat. Raise straight leg further upward to maximal range. Hold _30__ seconds. Relax leg completely down. Repeat _3__ times.   Strengthening: Straight Leg Raise (Phase 1)   Tighten muscles on front of right thigh, then lift leg __6-8__ inches from surface, keeping knee locked.  Repeat __15__ times per set. Do __2__ sets per session.    HIP / KNEE: Flexion, Heel Slides - Supine   Slide heel up toward buttocks, keeping leg in straight line. _15__ reps per set, __2_ sets per day   Bridge   Lie back, legs bent. Inhale, pressing hips up. Keeping ribs in, lengthen lower back. Exhale, rolling down along spine from top. Repeat _15___ times. Do __2__ sessions per day.   Long [employer]   Straighten operated leg and try to hold it __5__ seconds. Use __0_ lbs on ankle. Repeat __15__ times. Do __2__ sessions a day.   Mini Squat: Double Leg   With feet shoulder width apart, reach forward for balance and do a mini squat. Keep knees in line with second toe. Knees do not go past toes. Repeat _15__ times per set.  Do _2__ sets per session.

## 2016-12-23 NOTE — Therapy (Addendum)
[employer] [facility] [address]  [place], [place], [address] Phone: [phone]   Fax:  [phone]  Physical Therapy Evaluation  Patient Details  Name: Amanda Saunders MRN: [id number] Date of Birth: 21-Dec-1955 Referring Provider: Dr. Durward Fortes  Encounter Date: 12/23/2016      PT End of Session - 12/23/16 0904    Visit Number 1   Number of Visits 16   Date for PT Re-Evaluation 02/17/17   PT Start Time 0832   PT Stop Time 0908   PT Time Calculation (min) 36 min   Activity Tolerance Patient tolerated treatment well   Behavior During Therapy [facility] for tasks assessed/performed      Past Medical History:  Diagnosis Date  . Cardiomyopathy ([place])   . Hypertension   . PUD (peptic ulcer disease)   . Stroke ([place])   . Vertigo     Past Surgical History:  Procedure Laterality Date  . BACK SURGERY    . CARPAL TUNNEL RELEASE    . NECK SURGERY     2015    There were no vitals filed for this visit.       Subjective Assessment - 12/23/16 0833    Subjective Reports on 3/1 she was exercising with grandchild - on treadmill, was fine, then transitioned to bike. Couple days later she noticed some swelling. On 3/7 went to ED. Was referred to Dr. Durward Fortes, initially given injection - did not help. Was sent for MRI - torn meniscus and cartilage as well as noted arthritis. Had surgery on 11/20/16 - feels better, but difficulty lying leg straight. Does report continued swelling.    Pertinent History CVA (2015), HTN   Limitations Standing;Walking   How long can you stand comfortably? weight shifts to R LE   How long can you walk comfortably? okay to walk - pain after as well as difficulty sleeping   Diagnostic tests MRI:   Patient Stated Goals improve pain/function, return to exercising   Currently in Pain? Yes   Pain Score 7    Pain Location Knee   Pain Orientation Left   Pain Descriptors / Indicators Aching   Pain Type Acute  pain;Surgical pain   Pain Onset More than a month ago   Pain Frequency Intermittent   Aggravating Factors  prolonged walking, full knee extension, cooking (prolonged standing)   Pain Relieving Factors ice, pain meds            [facility] PT Assessment - 12/23/16 0840      Assessment   Medical Diagnosis L knee pain   Referring Provider Dr. Durward Fortes   Onset Date/Surgical Date 11/20/16   Prior Therapy no     Precautions   Precautions None     Restrictions   Weight Bearing Restrictions No     Balance Screen   Has the patient fallen in the past 6 months No   Has the patient had a decrease in activity level because of a fear of falling?  No   Is the patient reluctant to leave their home because of a fear of falling?  No     Home Social worker Private residence   Living Arrangements Spouse/significant other   Type of [place] to enter   Entrance Stairs-Number of Steps 15   Entrance Stairs-Rails Left   Home Layout One level   [place]     Prior Function   Level of  Independence Independent   Vocation Retired   Environmental consultant work     Charity fundraiser Status Within [employer] for tasks assessed     Observation/Other Assessments   Focus on Therapeutic Outcomes (FOTO)  Knee: 37 (63% limited, predicted 47% limited)     Sensation   Light Touch Appears Intact     Coordination   Gross Motor Movements are Fluid and Coordinated Yes     ROM / Strength   AROM / PROM / Strength AROM;Strength     AROM   AROM Assessment Site Knee   Right/Left Knee Right;Left   Right Knee Extension -1   Right Knee Flexion 120   Left Knee Extension 2   Left Knee Flexion 103     Strength   Overall Strength Comments L LE grossly 3+/5     Flexibility   Soft Tissue Assessment /Muscle Length --  some L HS tightness coupled with L gastroc/soleus tightness     Palpation   Patella mobility slightly reduced  mobility with sup/inf mobility     Ambulation/Gait   Ambulation/Gait Yes   Ambulation/Gait Assistance 6: Modified independent (Device/Increase time)   Ambulation Distance (Feet) 100 Feet   Assistive device None  use of brace - intermittent reaching towards wall/objects   Gait Pattern Decreased step length - right;Decreased stance time - left;Decreased hip/knee flexion - left;Decreased dorsiflexion - left;Decreased weight shift to left;Antalgic   Ambulation Surface Level;Indoor            Objective measurements completed on examination: See above findings.          [place] Adult PT Treatment/Exercise - 12/23/16 0840      Exercises   Exercises Knee/Hip     Knee/Hip Exercises: Stretches   Passive Hamstring Stretch Left;3 reps;30 seconds     Knee/Hip Exercises: Standing   Functional Squat 10 reps;3 seconds   Functional Squat Limitations B UE support     Knee/Hip Exercises: Seated   Long Arc Quad Left;10 reps   Long Arc Quad Limitations 5 sec hold     Knee/Hip Exercises: Supine   Heel Slides Left;10 reps   Bridges Both;10 reps   Straight Leg Raises Left;10 reps                PT Education - 12/23/16 [phone]    Education provided Yes   Education Details exam findings, POC, HEP   Person(s) Educated Patient   Methods Explanation;Demonstration;Handout   Comprehension Verbalized understanding;Returned demonstration          PT Short Term Goals - 12/23/16 0915      PT SHORT TERM GOAL #1   Title Patient to be independent with initial HEP (01/20/17)   Status New     PT SHORT TERM GOAL #2   Title Patient to improve L knee AROM to 0-120 (01/20/17)   Status New           PT Long Term Goals - 12/23/16 1219      PT LONG TERM GOAL #1   Title Patient to be independent with advanced HEP (02/17/17)   Status New     PT LONG TERM GOAL #2   Title Patient to demonstrate proper gait mechanics with good heel toe gait pattern and no evidence of antalgia of  instability (02/17/17)   Status New     PT LONG TERM GOAL #3   Title Patient to demonstrate ability to ascend/descend 15 steps reciprocally with single hand rail with  no LOB and appropriate quad control (02/17/17)   Status New     PT LONG TERM GOAL #4   Title Patient to improve L LE gross strength to 4+/5 (02/17/17)   Status New     PT LONG TERM GOAL #5   Title Patient to demonstrate no extensor lag with SLR (02/17/17)   Status New                Plan - 12/23/16 0908    Clinical Impression Statement Patient is a 61 y/p female presenting to [facility] today s/p L knee arthroscopy with partial medial meniscectomy on 11/20/16. Patient presenting today with hinged brace on L knee as well as with single axillary crutch, however seldomly uses for stability. Patient reports some continued pain along medial joint line as well as at posterior knee joint with full extension. Patient reporting that she has been sleeping with pillow under knee with PT discouraging this today to allow for full kne extension. Patient with some strength and ROM deficit noted at L knee, and will likely improve with stretching and strengthening program. Patient to benefit from PT to address deficits to allow for return to PLOF with improved quailty of life.    Clinical Presentation Stable   Clinical Decision Making Low   Rehab Potential Excellent   PT Frequency 2x / week   PT Duration 8 weeks   PT Treatment/Interventions ADLs/Self Care Home Management;Cryotherapy;Electrical Stimulation;Iontophoresis 68m/ml Dexamethasone;Moist Heat;Ultrasound;Neuromuscular re-education;Balance training;Therapeutic exercise;Therapeutic activities;Functional mobility training;Stair training;Gait training;Patient/family education;Manual techniques;Passive range of motion;Vasopneumatic Device;Taping;Dry needling   Consulted and Agree with Plan of Care Patient      Patient will benefit from skilled therapeutic intervention in order to improve the  following deficits and impairments:  Abnormal gait, Decreased activity tolerance, Decreased balance, Decreased range of motion, Decreased mobility, Decreased strength, Difficulty walking, Pain  Visit Diagnosis: Acute pain of left knee - Plan: PT plan of care cert/re-cert  Stiffness of left knee, not elsewhere classified - Plan: PT plan of care cert/re-cert  Difficulty in walking, not elsewhere classified - Plan: PT plan of care cert/re-cert  Other abnormalities of gait and mobility - Plan: PT plan of care cert/re-cert  Muscle weakness (generalized) - Plan: PT plan of care cert/re-cert      G-Codes - [id number]    Functional Assessment Tool Used (Outpatient Only) FOTO: 37 (63% limited)   Functional Limitation Mobility: Walking and moving around   Mobility: Walking and Moving Around Current Status (([id number] At least 60 percent but less than 80 percent impaired, limited or restricted   Mobility: Walking and Moving Around Goal Status [phone] At least 40 percent but less than 60 percent impaired, limited or restricted   Mobility: Walking and Moving Around Discharge Status [phone] At least 60 percent but less than 80 percent impaired, limited or restricted       Problem List Patient Active Problem List   Diagnosis Date Noted  . Pre-diabetes 04/03/2016  . History of CVA (cerebrovascular accident) 03/12/2016  . Chest pain at rest 03/12/2016  . Peripheral edema 03/12/2016    SLanney Saunders PT, DPT 12/23/16 9:27 AM  PHYSICAL THERAPY DISCHARGE SUMMARY  Visits from Start of Care: 1  Current functional level related to goals / functional outcomes: See above   Remaining deficits: See above   Education / Equipment: HEP  Plan: Patient agrees to discharge.  Patient goals were not met. Patient is being discharged due to not returning since the last visit.  ?????  Amanda Saunders, PT, DPT 02/09/17 9:36 AM  [facility] [address]  [place], [place], [address] Phone: [phone]   Fax:  [phone]  Name: Amanda Saunders MRN: [id number] Date of Birth: [birth year]

## 2016-12-25 ENCOUNTER — Ambulatory Visit (INDEPENDENT_AMBULATORY_CARE_PROVIDER_SITE_OTHER): Payer: PPO | Admitting: Orthopaedic Surgery

## 2016-12-25 ENCOUNTER — Encounter (INDEPENDENT_AMBULATORY_CARE_PROVIDER_SITE_OTHER): Payer: Self-pay | Admitting: Orthopaedic Surgery

## 2016-12-25 ENCOUNTER — Ambulatory Visit: Payer: PPO

## 2016-12-25 VITALS — BP 114/68 | HR 66 | Resp 14 | Ht 64.0 in | Wt 206.0 lb

## 2016-12-25 DIAGNOSIS — M1712 Unilateral primary osteoarthritis, left knee: Secondary | ICD-10-CM

## 2016-12-25 DIAGNOSIS — S83242D Other tear of medial meniscus, current injury, left knee, subsequent encounter: Secondary | ICD-10-CM

## 2016-12-25 NOTE — Progress Notes (Signed)
Office Visit Note   Patient: Amanda Saunders           Date of Birth: May 22, 1956           MRN: [id number] Visit Date: 12/25/2016              Requested by: Darreld Mclean, MD [address] [place], [place] [address] PCP: Darreld Mclean, MD   Assessment & Plan: Visit Diagnoses:  1. Other tear of medial meniscus, current injury, left knee, subsequent encounter   2. Unilateral primary osteoarthritis, left knee     Plan:  #1: Continue physical therapy as per protocol #2  Work hard on her home exercise program  Follow-Up Instructions: Return in about 3 weeks (around 01/15/2017).   Orders:  No orders of the defined types were placed in this encounter.  No orders of the defined types were placed in this encounter.     Procedures: No procedures performed   Clinical Data: No additional findings.   Subjective: Chief Complaint  Patient presents with  . Left Knee - Routine Post Op    Amanda Saunders is a 61 y o that is 4 weeks status post L knee arthroscopy. SHe relates he goes to PT 1x week and is at about 70%    Nearly 5 weeks status post left knee arthroscopy for a tear of the medial meniscus. There are also tricompartmental degenerative changes. Doing well without obvious complications. Doing physical therapy. They physical therapist states that she is only about the 50% to 75% of appropriate strength this time. She does use a double upright hinged brace which is more supratentorial that it is actually doing her any benefit. She does complain of some swelling at times. A little bit of pain over the pelvis.    Review of Systems   Objective: Vital Signs: BP 114/68   Pulse 66   Resp 14   Ht 5\' 4"  (1.626 m)   Wt 206 lb (93.4 kg)   BMI 35.36 kg/m   Physical Exam  Ortho Exam Exam today reveals range of motion from about 5 to 105. Trace effusion but is difficult to tell for sure because size were leg. Her wounds are well-healed. A little bit  tenderness to palpation over the wounds. Ligamentously remains stable. Calf is supple no real pain at this time to palpation. Neurovascular intact distally.  Specialty Comments:  No specialty comments available.  Imaging: No results found.   PMFS History: Patient Active Problem List   Diagnosis Date Noted  . Pre-diabetes 04/03/2016  . History of CVA (cerebrovascular accident) 03/12/2016  . Chest pain at rest 03/12/2016  . Peripheral edema 03/12/2016   Past Medical History:  Diagnosis Date  . Cardiomyopathy ([place])   . Hypertension   . PUD (peptic ulcer disease)   . Stroke ([place])   . Vertigo     Family History  Problem Relation Age of Onset  . Hypertension Mother   . Arthritis Mother   . Heart failure Mother   . Stroke Mother   . Kidney disease Father   . Hypertension Father     Past Surgical History:  Procedure Laterality Date  . BACK SURGERY    . CARPAL TUNNEL RELEASE    . NECK SURGERY     2015   Social History   Occupational History  .      retired   Social History Main Topics  . Smoking status: Former Smoker  Quit date: 07/21/2002  . Smokeless tobacco: Never Used  . Alcohol use Yes     Comment: occasional  . Drug use: No  . Sexual activity: Yes

## 2016-12-26 ENCOUNTER — Other Ambulatory Visit: Payer: Self-pay | Admitting: Emergency Medicine

## 2016-12-26 ENCOUNTER — Ambulatory Visit (INDEPENDENT_AMBULATORY_CARE_PROVIDER_SITE_OTHER): Payer: PPO | Admitting: Orthopaedic Surgery

## 2016-12-26 MED ORDER — IBUPROFEN 800 MG PO TABS: 800.0000 mg | ORAL_TABLET | Freq: Three times a day (TID) | ORAL | 1 refills | Status: DC | PRN

## 2016-12-29 ENCOUNTER — Ambulatory Visit: Payer: PPO | Admitting: Physical Therapy

## 2016-12-31 ENCOUNTER — Ambulatory Visit: Payer: PPO | Admitting: Physical Therapy

## 2017-01-01 ENCOUNTER — Ambulatory Visit: Payer: PPO

## 2017-01-05 ENCOUNTER — Ambulatory Visit: Payer: PPO | Admitting: Physical Therapy

## 2017-01-06 ENCOUNTER — Ambulatory Visit: Payer: PPO | Admitting: Neurology

## 2017-01-07 ENCOUNTER — Ambulatory Visit: Payer: PPO | Admitting: Physical Therapy

## 2017-01-08 ENCOUNTER — Ambulatory Visit: Payer: PPO

## 2017-01-12 ENCOUNTER — Ambulatory Visit: Payer: PPO | Admitting: Physical Therapy

## 2017-01-14 ENCOUNTER — Ambulatory Visit: Payer: PPO | Admitting: Physical Therapy

## 2017-01-15 ENCOUNTER — Ambulatory Visit: Payer: PPO

## 2017-01-22 ENCOUNTER — Ambulatory Visit: Payer: PPO | Admitting: Physical Therapy

## 2017-01-24 ENCOUNTER — Other Ambulatory Visit: Payer: Self-pay | Admitting: Family Medicine

## 2017-01-24 DIAGNOSIS — M25562 Pain in left knee: Secondary | ICD-10-CM

## 2017-01-26 ENCOUNTER — Other Ambulatory Visit: Payer: Self-pay | Admitting: Emergency Medicine

## 2017-01-26 ENCOUNTER — Ambulatory Visit (INDEPENDENT_AMBULATORY_CARE_PROVIDER_SITE_OTHER): Payer: PPO | Admitting: Orthopaedic Surgery

## 2017-01-26 DIAGNOSIS — M25562 Pain in left knee: Secondary | ICD-10-CM

## 2017-01-26 MED ORDER — TRAMADOL HCL 50 MG PO TABS: 50.0000 mg | ORAL_TABLET | Freq: Three times a day (TID) | ORAL | 0 refills | Status: DC | PRN

## 2017-01-26 NOTE — Telephone Encounter (Signed)
Requesting: traMADol (ULTRAM) 50 MG tablet Contract UDS Last OV: 11/03/16   Last Refill: 12/11/16  Please Advise

## 2017-01-26 NOTE — Telephone Encounter (Signed)
N[facility]: tramadol #30, 5/24, 3/15 She is being treated for a left knee problem

## 2017-01-28 ENCOUNTER — Ambulatory Visit: Payer: PPO | Admitting: Physical Therapy

## 2017-01-28 ENCOUNTER — Ambulatory Visit: Payer: PPO | Admitting: Neurology

## 2017-02-02 ENCOUNTER — Ambulatory Visit (INDEPENDENT_AMBULATORY_CARE_PROVIDER_SITE_OTHER): Payer: PPO | Admitting: Orthopaedic Surgery

## 2017-02-02 ENCOUNTER — Telehealth: Payer: Self-pay | Admitting: Family Medicine

## 2017-02-02 NOTE — Telephone Encounter (Signed)
Pt called in she that she seen on the news that they are recalling hydrochlorothiazide, pt would like to change medications.    Please advise.   CB: [phone]

## 2017-02-04 ENCOUNTER — Ambulatory Visit: Payer: PPO | Admitting: Physical Therapy

## 2017-02-04 NOTE — Telephone Encounter (Signed)
Pt informed

## 2017-02-04 NOTE — Telephone Encounter (Signed)
Please give her a call back- the concern is valsartan, not HCTZ. I don't think there is anything for her to worry about  Ok to [facility] with this info

## 2017-04-01 ENCOUNTER — Other Ambulatory Visit: Payer: Self-pay | Admitting: Pharmacist

## 2017-04-01 NOTE — Patient Outreach (Signed)
Incoming call from Teola Bradley in response to the [facility] Medication Adherence Campaign. Speak with patient. HIPAA identifiers verified and verbal consent received.  Ms. Baysinger reports that she has been taking her atorvastatin, but reports that she has been taking it every other day, rather than daily. Reports that she had associated this medication with her hydrochlorothiazide, which she does take as directed every other day, and started taking the atorvastatin the same way. Patient reviews direction on bottle for the atorvastatin and expresses appreciation for my calling to help her to identify this issue. Counsel patient on the importance of medication adherence. Patient verbalizes understanding and states that she will take the medication daily as directed moving forward. Patient denies any other barriers to medication adherence.  Denies any medication questions or concerns at this time. Provide patient with my phone number.  Harlow Asa, PharmD, [facility] [facility] [phone]

## 2017-05-19 ENCOUNTER — Other Ambulatory Visit: Payer: Self-pay | Admitting: Family Medicine

## 2017-05-19 ENCOUNTER — Encounter: Payer: Self-pay | Admitting: Family Medicine

## 2017-05-19 ENCOUNTER — Other Ambulatory Visit: Payer: Self-pay | Admitting: Emergency Medicine

## 2017-05-19 MED ORDER — HYDROCHLOROTHIAZIDE 25 MG PO TABS: 25.0000 mg | ORAL_TABLET | ORAL | 2 refills | Status: DC

## 2017-06-26 ENCOUNTER — Ambulatory Visit: Payer: Self-pay | Admitting: *Deleted

## 2017-06-26 NOTE — Telephone Encounter (Signed)
She called in from her pharmacy.   I just took my BP at the machine here and it is 13/60.  Is that good?   I told her it was excellent.    That's all she needed.

## 2017-07-01 ENCOUNTER — Other Ambulatory Visit: Payer: Self-pay | Admitting: Family Medicine

## 2017-07-01 DIAGNOSIS — M25562 Pain in left knee: Secondary | ICD-10-CM

## 2017-07-01 DIAGNOSIS — E559 Vitamin D deficiency, unspecified: Secondary | ICD-10-CM

## 2017-07-02 ENCOUNTER — Encounter: Payer: Self-pay | Admitting: Family Medicine

## 2017-07-02 MED ORDER — TRAMADOL HCL 50 MG PO TABS: 50.0000 mg | ORAL_TABLET | Freq: Three times a day (TID) | ORAL | 0 refills | Status: DC | PRN

## 2017-07-02 NOTE — Telephone Encounter (Signed)
Pt requesting refill on Ergocalciferol and tramadol 50mg . Please advise.

## 2017-07-03 ENCOUNTER — Encounter: Payer: Self-pay | Admitting: Family Medicine

## 2017-07-24 ENCOUNTER — Other Ambulatory Visit: Payer: Self-pay | Admitting: Cardiology

## 2017-07-24 ENCOUNTER — Other Ambulatory Visit: Payer: Self-pay | Admitting: Neurology

## 2017-07-24 DIAGNOSIS — R072 Precordial pain: Secondary | ICD-10-CM

## 2017-07-25 ENCOUNTER — Other Ambulatory Visit: Payer: Self-pay | Admitting: Neurology

## 2017-07-30 ENCOUNTER — Other Ambulatory Visit: Payer: Self-pay | Admitting: Neurology

## 2017-07-30 ENCOUNTER — Encounter: Payer: Self-pay | Admitting: Family Medicine

## 2017-07-31 MED ORDER — ATORVASTATIN CALCIUM 10 MG PO TABS: 10.0000 mg | ORAL_TABLET | Freq: Every day | ORAL | 1 refills | Status: DC

## 2017-08-02 IMAGING — US US PELVIS LIMITED
1 series · 14 of 17 positions shown · non-contrast
Comparison: None.

CLINICAL DATA: Right lower quadrant superficial lump for 1 month
with tenderness.

EXAM:
LIMITED ULTRASOUND OF PELVIS
TECHNIQUE: Limited transabdominal ultrasound examination of the pelvis was
performed.

[Series 1: us pelvis limited · 0.07mm/px · 17 acquisitions, 14 frames shown]
[im 1/17]
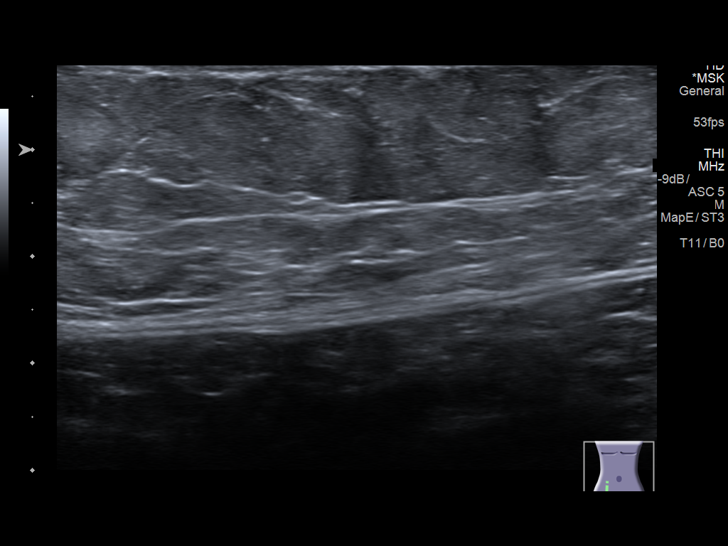
[im 2/17]
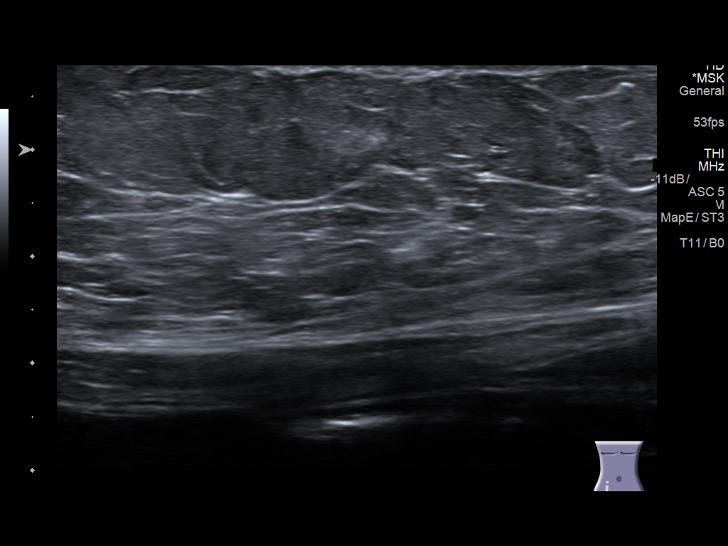
[im 4/17]
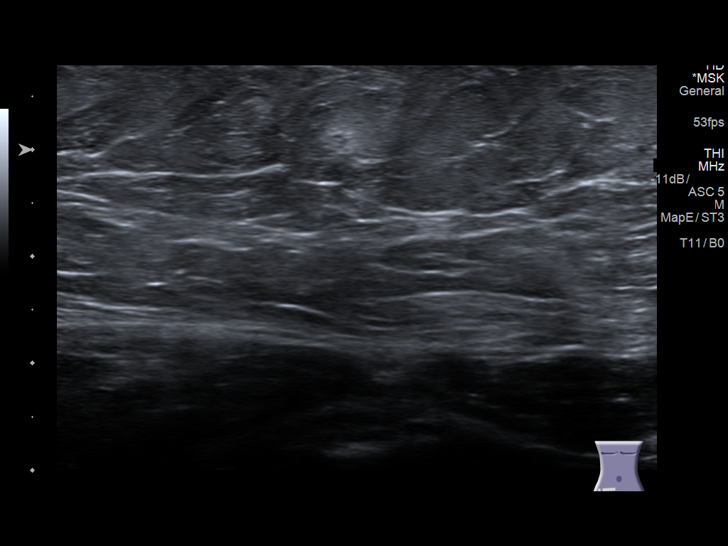
[im 5/17]
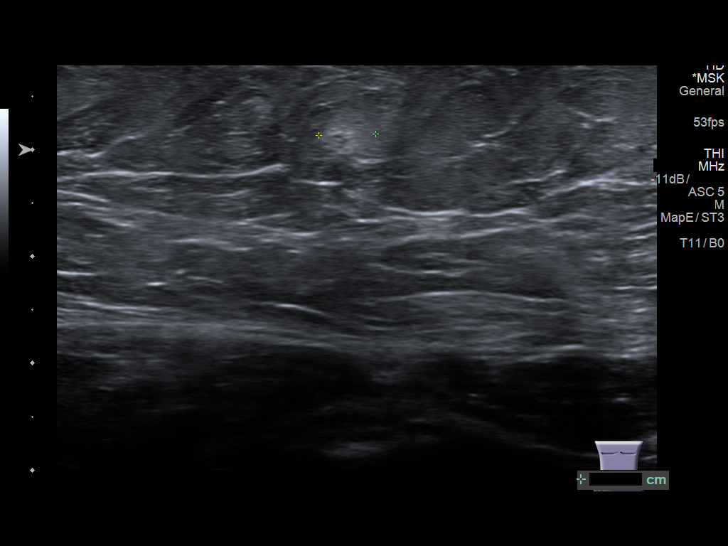
[im 6/17]
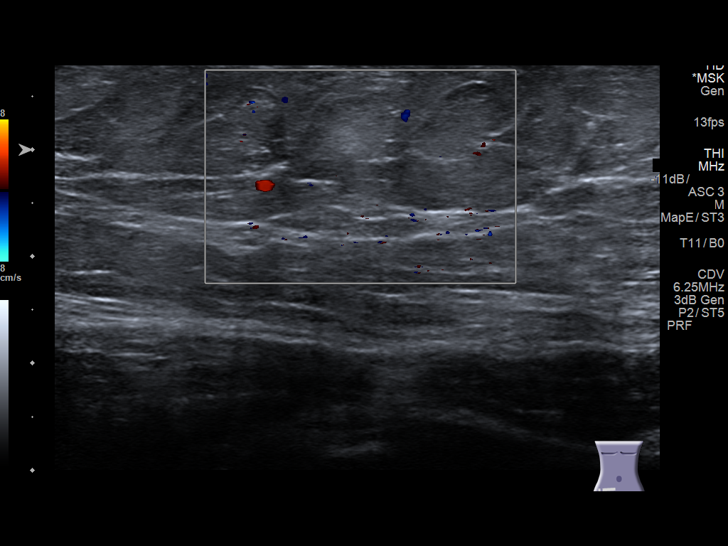
[im 7/17]
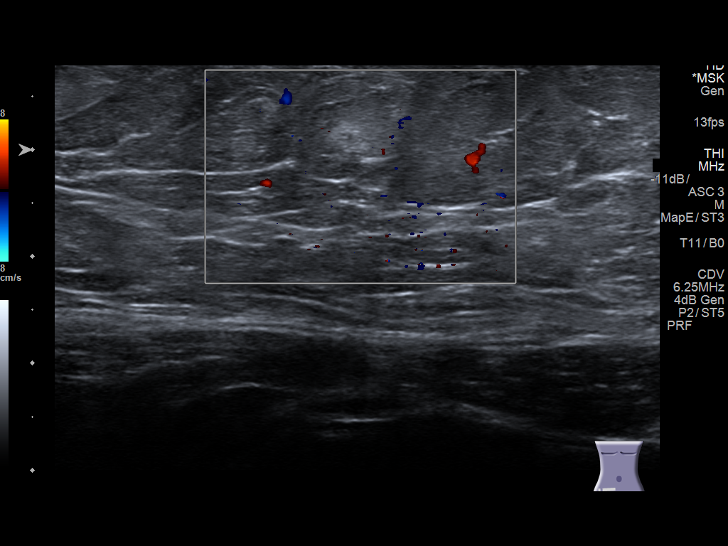
[im 8/17]
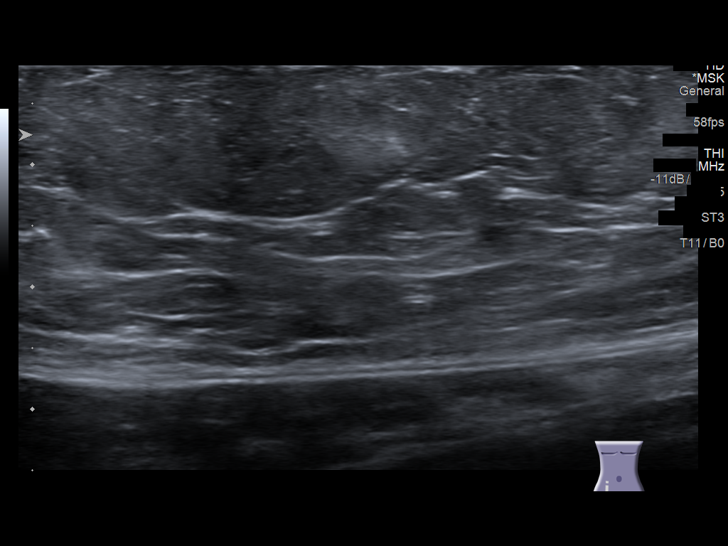
[im 10/17]
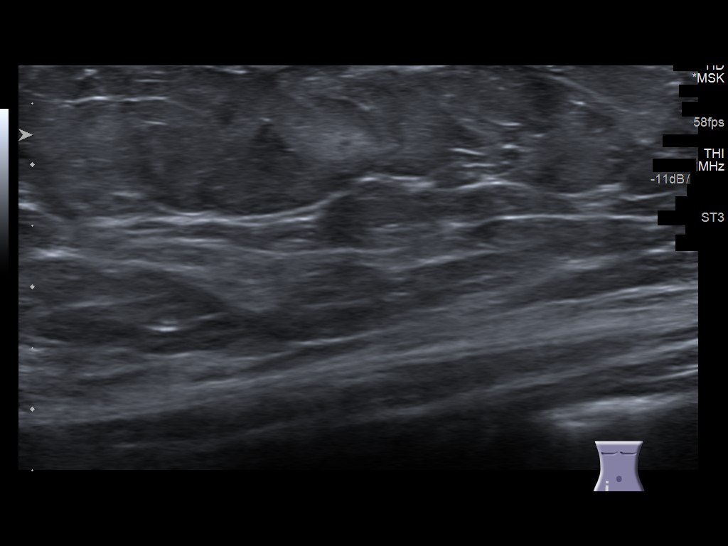
[im 11/17]
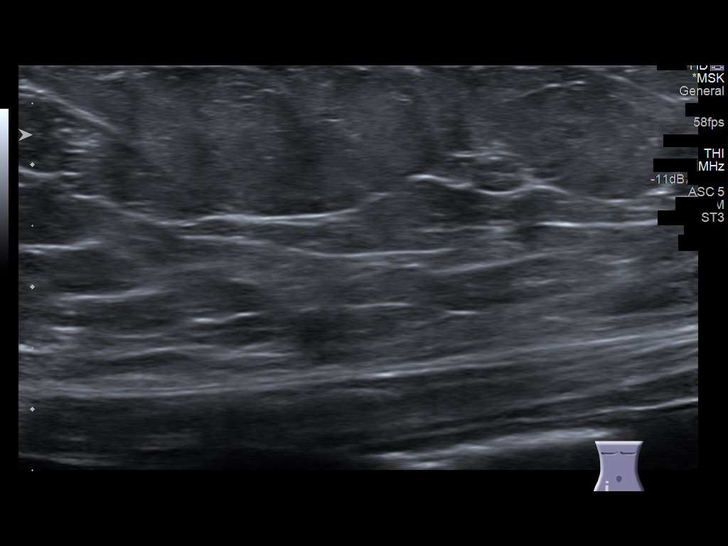
[im 12/17]
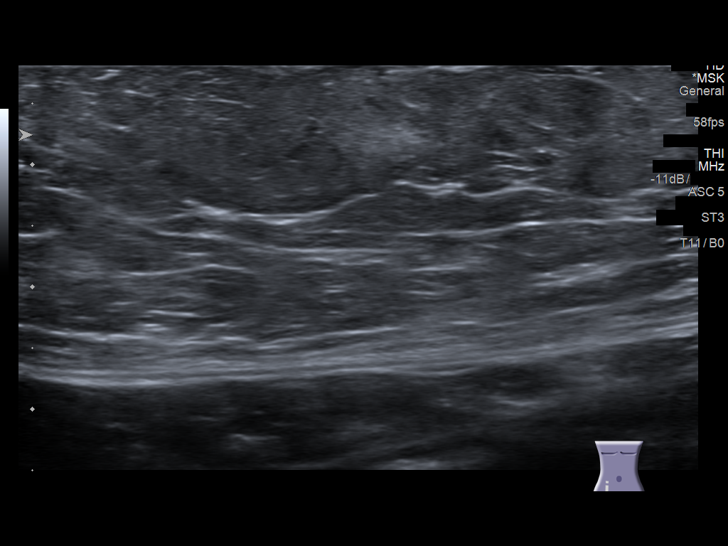
[im 13/17]
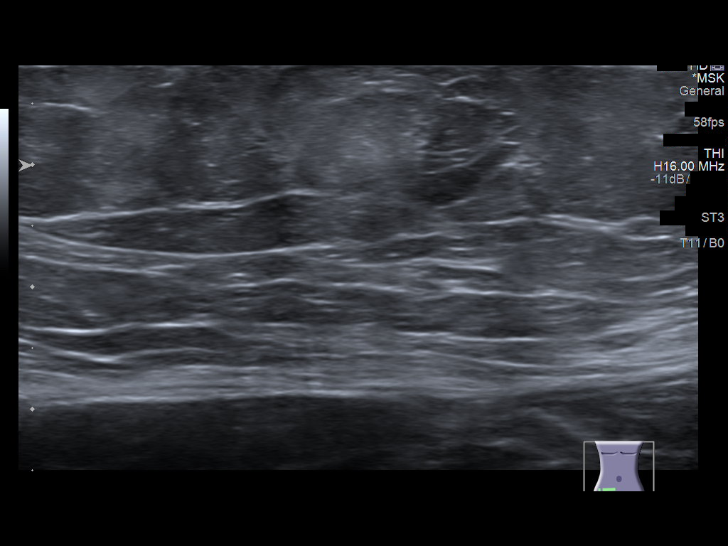
[im 14/17]
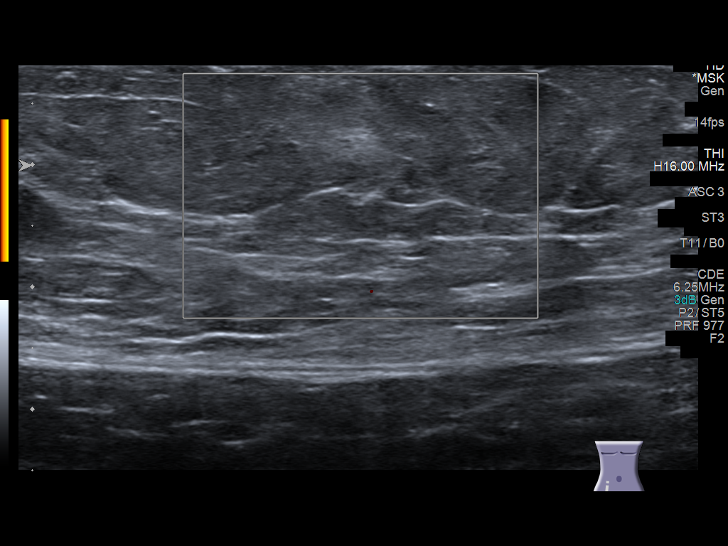
[im 16/17]
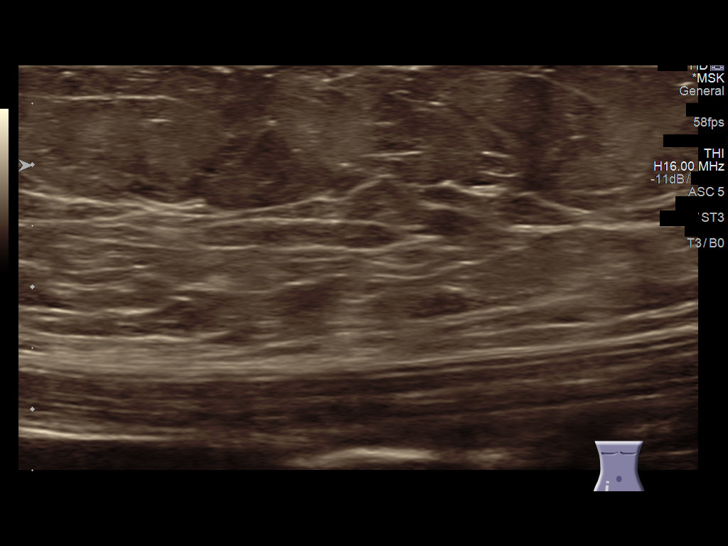
[im 17/17]
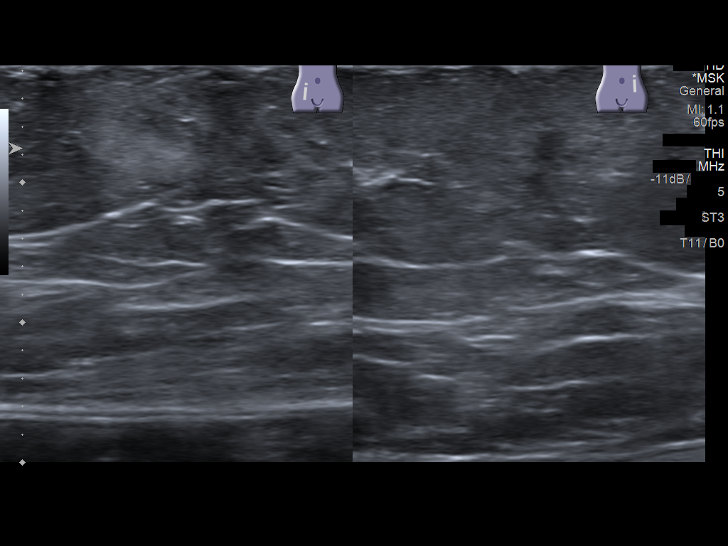

[14 of 17 positions shown; findings below may reference images not displayed]

FINDINGS: Vague focal hyperechoic areas seen corresponding to the palpable
lump. This measures 9 x 3 x 5 mm. There is no internal blood flow.
This is located in the subcutaneous fat.
IMPRESSION: 9 mm vaque, hyperechoic lesion in the subcutaneous fat corresponds
to the palpable abnormality. This could reflect a small contusion
but is nonspecific. No other abnormality.

## 2017-09-01 ENCOUNTER — Telehealth: Payer: Self-pay | Admitting: Family Medicine

## 2017-09-01 DIAGNOSIS — E041 Nontoxic single thyroid nodule: Secondary | ICD-10-CM

## 2017-09-01 NOTE — Telephone Encounter (Signed)
Called and Amanda [facility]- it is time for follow-up thyroid US.  Will order this for her

## 2017-09-24 ENCOUNTER — Ambulatory Visit (HOSPITAL_BASED_OUTPATIENT_CLINIC_OR_DEPARTMENT_OTHER)
Admission: RE | Admit: 2017-09-24 | Discharge: 2017-09-24 | Disposition: A | Discharge status: Home / Self Care | Payer: PPO | Source: Ambulatory Visit | Attending: Family Medicine | Admitting: Family Medicine

## 2017-09-24 DIAGNOSIS — E041 Nontoxic single thyroid nodule: Secondary | ICD-10-CM

## 2017-09-24 DIAGNOSIS — E042 Nontoxic multinodular goiter: Secondary | ICD-10-CM | POA: Diagnosis not present

## 2017-09-25 ENCOUNTER — Encounter: Payer: Self-pay | Admitting: Family Medicine

## 2017-09-25 ENCOUNTER — Other Ambulatory Visit: Payer: Self-pay | Admitting: Family Medicine

## 2017-09-25 DIAGNOSIS — E041 Nontoxic single thyroid nodule: Secondary | ICD-10-CM

## 2018-01-04 DIAGNOSIS — H10023 Other mucopurulent conjunctivitis, bilateral: Secondary | ICD-10-CM | POA: Diagnosis not present

## 2018-01-08 ENCOUNTER — Other Ambulatory Visit: Payer: Self-pay

## 2018-01-08 ENCOUNTER — Emergency Department (HOSPITAL_BASED_OUTPATIENT_CLINIC_OR_DEPARTMENT_OTHER)
Admission: EM | Admit: 2018-01-08 | Discharge: 2018-01-09 | Disposition: A | Discharge status: Home / Self Care | Payer: PPO | Attending: Emergency Medicine | Admitting: Emergency Medicine

## 2018-01-08 ENCOUNTER — Emergency Department (HOSPITAL_BASED_OUTPATIENT_CLINIC_OR_DEPARTMENT_OTHER): Payer: PPO

## 2018-01-08 ENCOUNTER — Encounter (HOSPITAL_BASED_OUTPATIENT_CLINIC_OR_DEPARTMENT_OTHER): Payer: Self-pay

## 2018-01-08 DIAGNOSIS — R05 Cough: Secondary | ICD-10-CM | POA: Diagnosis not present

## 2018-01-08 DIAGNOSIS — I10 Essential (primary) hypertension: Secondary | ICD-10-CM | POA: Diagnosis not present

## 2018-01-08 DIAGNOSIS — N3000 Acute cystitis without hematuria: Secondary | ICD-10-CM | POA: Diagnosis not present

## 2018-01-08 DIAGNOSIS — Z79899 Other long term (current) drug therapy: Secondary | ICD-10-CM | POA: Insufficient documentation

## 2018-01-08 DIAGNOSIS — Z87891 Personal history of nicotine dependence: Secondary | ICD-10-CM | POA: Diagnosis not present

## 2018-01-08 DIAGNOSIS — Z7982 Long term (current) use of aspirin: Secondary | ICD-10-CM | POA: Insufficient documentation

## 2018-01-08 DIAGNOSIS — R1110 Vomiting, unspecified: Secondary | ICD-10-CM | POA: Diagnosis present

## 2018-01-08 DIAGNOSIS — R509 Fever, unspecified: Secondary | ICD-10-CM

## 2018-01-08 LAB — COMPREHENSIVE METABOLIC PANEL
ALBUMIN: 3.8 g/dL (ref 3.5–5.0)
ALT: 47 U/L (ref 14–54)
AST: 42 U/L — AB (ref 15–41)
Alkaline Phosphatase: 65 U/L (ref 38–126)
Anion gap: 10 (ref 5–15)
BUN: 11 mg/dL (ref 6–20)
CHLORIDE: 100 mmol/L — AB (ref 101–111)
CO2: 26 mmol/L (ref 22–32)
Calcium: 8.6 mg/dL — ABNORMAL LOW (ref 8.9–10.3)
Creatinine, Ser: 0.87 mg/dL (ref 0.44–1.00)
GFR calc Af Amer: >60 mL/min (ref >60)
GFR calc non Af Amer: >60 mL/min (ref >60)
GLUCOSE: 122 mg/dL — AB (ref 65–99)
POTASSIUM: 3.8 mmol/L (ref 3.5–5.1)
Sodium: 136 mmol/L (ref 135–145)
Total Bilirubin: 0.1 mg/dL — ABNORMAL LOW (ref 0.3–1.2)
Total Protein: 7.1 g/dL (ref 6.5–8.1)

## 2018-01-08 LAB — LIPASE, BLOOD: LIPASE: 26 U/L (ref 11–51)

## 2018-01-08 LAB — CBC
HEMATOCRIT: 40.9 % (ref 36.0–46.0)
Hemoglobin: 14.3 g/dL (ref 12.0–15.0)
MCH: 29 pg (ref 26.0–34.0)
MCHC: 35 g/dL (ref 30.0–36.0)
MCV: 83 fL (ref 78.0–100.0)
Platelets: 198 10*3/uL (ref 150–400)
RBC: 4.93 MIL/uL (ref 3.87–5.11)
RDW: 14.6 % (ref 11.5–15.5)
WBC: 9.6 10*3/uL (ref 4.0–10.5)

## 2018-01-08 LAB — I-STAT CG4 LACTIC ACID, ED: Lactic Acid, Venous: 1.75 mmol/L (ref 0.5–1.9)

## 2018-01-08 MED ORDER — ONDANSETRON 4 MG PO TBDP
4.0000 mg | ORAL_TABLET | Freq: Once | ORAL | Status: AC | PRN
  Administered 2018-01-08: 4 mg via ORAL
  Filled 2018-01-08: qty 1

## 2018-01-08 MED ORDER — IBUPROFEN 400 MG PO TABS
600.0000 mg | ORAL_TABLET | Freq: Once | ORAL | Status: AC
  Administered 2018-01-08: 600 mg via ORAL
  Filled 2018-01-08: qty 1

## 2018-01-08 MED ORDER — SODIUM CHLORIDE 0.9 % IV BOLUS
1000.0000 mL | Freq: Once | INTRAVENOUS | Status: AC
  Administered 2018-01-08: 1000 mL via INTRAVENOUS

## 2018-01-08 NOTE — ED Provider Notes (Signed)
[facility] Provider Note   CSN: [id number] Arrival date & time: 01/08/18  2221     History   Chief Complaint Chief Complaint  Patient presents with  . Emesis    HPI Amanda Saunders is a 62 y.o. female.  Pt is a 62y/o female with hx of cardiomyopathy, PUD, HTN and stroke presenting today with fever, bodyaches and myalgias all day today.  Pt had temp of 102 at home.  Tried taking ibuprofen and tylenol but fever persists.  Also having nausea and vomiting but denies abd pain, diarrhea, sore throat.  She is having some general SOB and cough.  Cough has been for 1 month intermittently and no sputum.  No rhinorrhea, headache, neck pain or rash.  No tick exposure but has had some sick contact.  Mild burning with urination only today.  No vaginal sx.  The history is provided by the patient.    Past Medical History:  Diagnosis Date  . Cardiomyopathy ([place])   . Hypertension   . PUD (peptic ulcer disease)   . Stroke ([place])   . Vertigo     Patient Active Problem List   Diagnosis Date Noted  . Pre-diabetes 04/03/2016  . History of CVA (cerebrovascular accident) 03/12/2016  . Chest pain at rest 03/12/2016  . Peripheral edema 03/12/2016    Past Surgical History:  Procedure Laterality Date  . BACK SURGERY    . CARPAL TUNNEL RELEASE    . NECK SURGERY     2015     OB History   None      Home Medications    Prior to Admission medications   Medication Sig Start Date End Date Taking? Authorizing Provider  aspirin 325 MG EC tablet Take 650 mg by mouth daily as needed (for chest pain episodes).     [provider]  aspirin 81 MG tablet Take 81 mg by mouth daily.    [provider]  atorvastatin (LIPITOR) 10 MG tablet Take 1 tablet (10 mg total) by mouth daily. 07/31/17   Copland, Gay Filler, MD  hydrochlorothiazide (HYDRODIURIL) 25 MG tablet Take 1 tablet (25 mg total) by mouth every other day. 05/19/17   Copland, Gay Filler, MD    HYDROmorphone (DILAUDID) 2 MG tablet  11/20/16   [provider]  ibuprofen (ADVIL,MOTRIN) 800 MG tablet Take 1 tablet (800 mg total) by mouth every 8 (eight) hours as needed (for back pain). 12/26/16   Copland, Gay Filler, MD  meclizine (ANTIVERT) 25 MG tablet Take 1 tablet (25 mg total) by mouth 3 (three) times daily as needed for dizziness. 07/08/16   Pieter Partridge, DO  metoprolol succinate (TOPROL-XL) 25 MG 24 hr tablet TAKE ONE TABLET BY MOUTH ONCE DAILY 07/24/17   Lelon Perla, MD  Pyridoxine HCl (VITAMIN B-6) 500 MG tablet Take 500 mg by mouth every other day.     [provider]  traMADol (ULTRAM) 50 MG tablet Take 1 tablet (50 mg total) by mouth every 8 (eight) hours as needed. 07/02/17   Copland, Gay Filler, MD  vitamin B-12 (CYANOCOBALAMIN) 500 MCG tablet Take 500 mcg by mouth every other day.     [provider]  Vitamin D, Ergocalciferol, (DRISDOL) 50000 units CAPS capsule Take 1 capsule (50,000 Units total) by mouth every 7 (seven) days. 04/03/16   Copland, Gay Filler, MD    Family History Family History  Problem Relation Age of Onset  . Hypertension Mother   . Arthritis  Mother   . Heart failure Mother   . Stroke Mother   . Kidney disease Father   . Hypertension Father     Social History Social History   Tobacco Use  . Smoking status: Former Smoker    Last attempt to quit: 07/21/2002    Years since quitting: 15.4  . Smokeless tobacco: Never Used  Substance Use Topics  . Alcohol use: Yes    Comment: occasional  . Drug use: No     Allergies   Codeine   Review of Systems Review of Systems  All other systems reviewed and are negative.    Physical Exam Updated Vital Signs BP 124/82 (BP Location: Right Arm)   Pulse 95   Temp (!) 102.4 F (39.1 C) (Oral)   Resp (!) 24   Ht 5\' 3"  (1.6 m)   Wt 90.3 kg (199 lb)   SpO2 96%   BMI 35.25 kg/m   Physical Exam  Constitutional: She is oriented to person, place, and time. She appears  well-developed and well-nourished. No distress.  HENT:  Head: Normocephalic and atraumatic.  Right Ear: Tympanic membrane normal.  Left Ear: Tympanic membrane normal.  Nose: Mucosal edema present.  Mouth/Throat: Oropharynx is clear and moist. No oropharyngeal exudate, posterior oropharyngeal edema or posterior oropharyngeal erythema.  Eyes: Pupils are equal, round, and reactive to light. Conjunctivae and EOM are normal.  Neck: Normal range of motion. Neck supple. No spinous process tenderness and no muscular tenderness present. Normal range of motion present. No Brudzinski's sign and no Kernig's sign noted.  Cardiovascular: Normal rate, regular rhythm and intact distal pulses.  No murmur heard. Pulmonary/Chest: Effort normal and breath sounds normal. No respiratory distress. She has no wheezes. She has no rales.  Abdominal: Soft. She exhibits no distension. There is no tenderness. There is no rebound and no guarding.  Musculoskeletal: Normal range of motion. She exhibits no edema or tenderness.  Neurological: She is alert and oriented to person, place, and time.  Skin: Skin is warm and dry. No rash noted. No erythema.  Psychiatric: She has a normal mood and affect. Her behavior is normal.  Nursing note and vitals reviewed.    ED Treatments / Results  Labs (all labs ordered are listed, but only abnormal results are displayed) Labs Reviewed  COMPREHENSIVE METABOLIC PANEL - Abnormal; Notable for the following components:      Result Value   Chloride 100 (*)    Glucose, Bld 122 (*)    Calcium 8.6 (*)    AST 42 (*)    Total Bilirubin 0.1 (*)    All other components within normal limits  LIPASE, BLOOD  CBC  URINALYSIS, ROUTINE W REFLEX MICROSCOPIC  PREGNANCY, URINE  I-STAT CG4 LACTIC ACID, ED    EKG None  Radiology No results found.  Procedures Procedures (including critical care time)  Medications Ordered in ED Medications  sodium chloride 0.9 % bolus 1,000 mL (has no  administration in time range)  ibuprofen (ADVIL,MOTRIN) tablet 600 mg (has no administration in time range)  ondansetron (ZOFRAN-ODT) disintegrating tablet 4 mg (4 mg Oral Given 01/08/18 2239)     Initial Impression / Assessment and Plan / ED Course  I have reviewed the triage vital signs and the nursing notes.  Pertinent labs & imaging results that were available during my care of the patient were reviewed by me and considered in my medical decision making (see chart for details).     Pt presenting with high fever  all day today with malaise and myalgias.  Cough which has been persistent but no worse today.  Nausea and vomiting but most likely from fever b/c pt has no abd pain.  Exam is benign.  No meningeal signs or rashes.  Low suspicion for RMSF, measles or strep.  Lungs are clear but will r/o PNA and UTI.  Pt treated with fluids and given fever control.  CBC, CMP, lipase, lactate, CXR and UA pending.  1:41 AM Labs reassuring except for UA which is concerning for UTI and pt did have burning with urination.  Urine culture sent.  CXR, lactate and CBC, CmP wnl.  Will give dose of rocephin and d/c with keflex.  Pt is tolerating po's.   Final Clinical Impressions(s) / ED Diagnoses   Final diagnoses:  Acute cystitis without hematuria  Fever, unspecified fever cause    ED Discharge Orders        Ordered    cephALEXin (KEFLEX) 500 MG capsule  4 times daily     01/09/18 0144    ondansetron (ZOFRAN ODT) 4 MG disintegrating tablet  Every 8 hours PRN     01/09/18 0144       Blanchie Dessert, MD 01/09/18 0144

## 2018-01-08 NOTE — ED Triage Notes (Addendum)
C/o vomiting,fever x today-active vomiting in triage-NAD-to triage in w/c-last tylenol 2 hours PTA

## 2018-01-08 NOTE — ED Notes (Signed)
ED Provider at bedside. 

## 2018-01-09 LAB — URINALYSIS, ROUTINE W REFLEX MICROSCOPIC
BILIRUBIN URINE: NEGATIVE
Glucose, UA: NEGATIVE mg/dL
KETONES UR: NEGATIVE mg/dL
NITRITE: NEGATIVE
PROTEIN: NEGATIVE mg/dL
Specific Gravity, Urine: 1.01 (ref 1.005–1.030)
pH: 6 (ref 5.0–8.0)

## 2018-01-09 LAB — URINALYSIS, MICROSCOPIC (REFLEX)

## 2018-01-09 LAB — PREGNANCY, URINE: PREG TEST UR: NEGATIVE

## 2018-01-09 MED ORDER — ONDANSETRON 4 MG PO TBDP: 4.0000 mg | ORAL_TABLET | Freq: Three times a day (TID) | ORAL | 0 refills | Status: DC | PRN

## 2018-01-09 MED ORDER — CEPHALEXIN 500 MG PO CAPS: 500.0000 mg | ORAL_CAPSULE | Freq: Four times a day (QID) | ORAL | 0 refills | Status: DC

## 2018-01-09 MED ORDER — SODIUM CHLORIDE 0.9 % IV SOLN
1.0000 g | Freq: Once | INTRAVENOUS | Status: AC
  Administered 2018-01-09: 1 g via INTRAVENOUS
  Filled 2018-01-09: qty 10

## 2018-01-10 LAB — URINE CULTURE

## 2018-01-12 ENCOUNTER — Encounter: Payer: Self-pay | Admitting: Family Medicine

## 2018-01-14 ENCOUNTER — Encounter: Payer: Self-pay | Admitting: Family Medicine

## 2018-01-17 NOTE — Progress Notes (Addendum)
[place] at [facility] [address] [place], [place] [address] [phone]  Date:  01/18/2018   Name:  Amanda Saunders   DOB:  [birth year]   MRN:  [id number]  PCP:  Darreld Mclean, MD    Chief Complaint: ER follow up (Nausea, vomiting, dizziness, fever)   History of Present Illness:  Amanda Saunders is a 62 y.o. very pleasant female patient who presents with the following:  Following up from ER visit on 6/21 today:     Pt presenting with high fever all day today with malaise and myalgias.  Cough which has been persistent but no worse today.  Nausea and vomiting but most likely from fever b/c pt has no abd pain.  Exam is benign.  No meningeal signs or rashes.  Low suspicion for RMSF, measles or strep.  Lungs are clear but will r/o PNA and UTI.  Pt treated with fluids and given fever control.  CBC, CMP, lipase, lactate, CXR and UA pending. Labs reassuring except for UA which is concerning for UTI and pt did have burning with urination.  Urine culture sent.  CXR, lactate and CBC, CmP wnl.  Will give dose of rocephin and d/c with keflex.  Pt is tolerating po's.   Urine culture showed mixed bacteria   Pt notes that when she went to the ER she had been ill for about 3 days, vomiting, dizziness, fever up to 102.4.   Pt notes that she had a temp of 101 this am which responded to OTC meds She continues to feel "so sick (nauseated) and lightheaded, and I have no energy." She is not vomitng any longer but she using zofran to prevent vomiting She notes no belly pain No ST She is coughing  She does still have dark yellow urine but no hematuria Also dysuria No genital lesions or ulcers  No recent travel No sick contacts that she can determine She still has some keflex left- she is taking 500 QID, is nearly done with rx now  She has not noted any rash She has not pulled off a tick  She took tylenol for her temp this am   She had  contacted me about concern of genital itching/ irritation after taking keflex- suggested an OTC monistat pack which she used, it did help  Patient Active Problem List   Diagnosis Date Noted  . Pre-diabetes 04/03/2016  . History of CVA (cerebrovascular accident) 03/12/2016  . Chest pain at rest 03/12/2016  . Peripheral edema 03/12/2016    Past Medical History:  Diagnosis Date  . Cardiomyopathy ([place])   . Hypertension   . PUD (peptic ulcer disease)   . Stroke ([place])   . Vertigo     Past Surgical History:  Procedure Laterality Date  . BACK SURGERY    . CARPAL TUNNEL RELEASE    . NECK SURGERY     2015    Social History   Tobacco Use  . Smoking status: Former Smoker    Last attempt to quit: 07/21/2002    Years since quitting: 15.5  . Smokeless tobacco: Never Used  Substance Use Topics  . Alcohol use: Yes    Comment: occasional  . Drug use: No    Family History  Problem Relation Age of Onset  . Hypertension Mother   . Arthritis Mother   . Heart failure Mother   . Stroke Mother   . Kidney disease Father   .  Hypertension Father     Allergies  Allergen Reactions  . Codeine Nausea And Vomiting    Medication list has been reviewed and updated.  Current Outpatient Medications on File Prior to Visit  Medication Sig Dispense Refill  . aspirin 325 MG EC tablet Take 650 mg by mouth daily as needed (for chest pain episodes).     Marland Kitchen aspirin 81 MG tablet Take 81 mg by mouth daily.    . cephALEXin (KEFLEX) 500 MG capsule Take 1 capsule (500 mg total) by mouth 4 (four) times daily. 28 capsule 0  . HYDROmorphone (DILAUDID) 2 MG tablet     . ibuprofen (ADVIL,MOTRIN) 800 MG tablet Take 1 tablet (800 mg total) by mouth every 8 (eight) hours as needed (for back pain). 30 tablet 1  . ondansetron (ZOFRAN ODT) 4 MG disintegrating tablet Take 1 tablet (4 mg total) by mouth every 8 (eight) hours as needed for nausea or vomiting. 20 tablet 0  . Pyridoxine HCl (VITAMIN B-6) 500 MG tablet  Take 500 mg by mouth every other day.     . traMADol (ULTRAM) 50 MG tablet Take 1 tablet (50 mg total) by mouth every 8 (eight) hours as needed. 30 tablet 0  . vitamin B-12 (CYANOCOBALAMIN) 500 MCG tablet Take 500 mcg by mouth every other day.     . Vitamin D, Ergocalciferol, (DRISDOL) 50000 units CAPS capsule Take 1 capsule (50,000 Units total) by mouth every 7 (seven) days. 12 capsule 0   No current facility-administered medications on file prior to visit.     Review of Systems:  As per HPI- otherwise negative.   Physical Examination: Vitals:   01/18/18 0910  BP: 126/72  Pulse: 86  Resp: 16  Temp: 97.8 F (36.6 C)  SpO2: 98%   Vitals:   01/18/18 0910  Weight: 200 lb (90.7 kg)  Height: 5\' 3"  (1.6 m)   Body mass index is 35.43 kg/m. Ideal Body Weight: Weight in (lb) to have BMI = 25: 140.8  GEN: WDWN, NAD, Non-toxic, A & O x 3. Overweight, looks well  HEENT: Atraumatic, Normocephalic. Neck supple. No masses, No LAD.  Bilateral TM wnl, oropharynx normal.  PEERL,EOMI.   Ears and Nose: No external deformity. CV: RRR, No M/G/R. No JVD. No thrill. No extra heart sounds. PULM: CTA B, no wheezes, crackles, rhonchi. No retractions. No resp. distress. No accessory muscle use. ABD: S, ND, +BS. No rebound. No HSM.  She notes diffuse belly tenderness, worse in different areas on serial exam. Most centered on LLQ  EXTR: No c/c/e NEURO Normal gait.  PSYCH: Normally interactive. Conversant. Not depressed or anxious appearing.  Calm demeanor.  Pelvic: normal, no vaginal lesions or discharge. Uterus normal, no CMT, no adnexal tendereness or masses  Results for orders placed or performed in visit on 01/18/18  CBC  Result Value Ref Range   WBC 5.0 4.0 - 10.5 K/uL   RBC 4.72 3.87 - 5.11 Mil/uL   Platelets 235.0 150.0 - 400.0 K/uL   Hemoglobin 13.4 12.0 - 15.0 g/dL   HCT 40.1 36.0 - 46.0 %   MCV 84.9 78.0 - 100.0 fl   MCHC 33.3 30.0 - 36.0 g/dL   RDW 14.9 11.5 - 15.5 %  POCT  urinalysis dipstick  Result Value Ref Range   Color, UA yellow yellow   Clarity, UA cloudy (A) clear   Glucose, UA negative negative mg/dL   Bilirubin, UA negative negative   Ketones, POC UA negative negative mg/dL  Spec Grav, UA 1.020 1.010 - 1.025   Blood, UA negative negative   pH, UA 6.0 5.0 - 8.0   Protein Ur, POC trace (A) negative mg/dL   Urobilinogen, UA 0.2 0.2 or 1.0 E.U./dL   Nitrite, UA Negative Negative   Leukocytes, UA Negative Negative     Assessment and Plan: Fever, unknown origin - Plan: Urine Culture, CBC, HIV antibody, QuantiFERON-TB Gold Plus, POCT urinalysis dipstick, doxycycline (VIBRAMYCIN) 100 MG capsule  Right upper quadrant abdominal tenderness without rebound tenderness - Plan: US Abdomen Limited RUQ  Left lower quadrant pain - Plan: CT Abdomen Pelvis W Contrast  Fever, unspecified fever cause - Plan: CT Abdomen Pelvis W Contrast  Precordial pain - Plan: metoprolol succinate (TOPROL-XL) 25 MG 24 hr tablet  Essential hypertension - Plan: metoprolol succinate (TOPROL-XL) 25 MG 24 hr tablet  Peripheral edema - Plan: hydrochlorothiazide (HYDRODIURIL) 25 MG tablet  Hyperlipidemia, unspecified hyperlipidemia type - Plan: atorvastatin (LIPITOR) 10 MG tablet  Vertigo - Plan: meclizine (ANTIVERT) 25 MG tablet  Here today for ER follow-up and continued symptoms, she has noted fevers up to 101 earlier today.  Her only other specific sx are a cough and belly pain. Will obtain CT scan today to rule out diverticulitis.   Other labs pending as above. Re-culture urine   Signed Lamar Blinks, MD  Received her labs so far and her CT scan- called pt to discuss. So far nothing useful has come up. Need to consider RMSF in this area in summer.  Will add doxycycline to her regimen while we await her other labs .  Her keflex is nearly finished.  Will be back in touch with her as soon as other labs are available.  She will seek care if any change or worsening in the  meantime   Also did several routine refills for her today  Results for orders placed or performed in visit on 01/18/18  Urine Culture  Result Value Ref Range   MICRO NUMBER: [id number]    SPECIMEN QUALITY: ADEQUATE    Sample Source URINE    STATUS: FINAL    Result: No Growth   CBC  Result Value Ref Range   WBC 5.0 4.0 - 10.5 K/uL   RBC 4.72 3.87 - 5.11 Mil/uL   Platelets 235.0 150.0 - 400.0 K/uL   Hemoglobin 13.4 12.0 - 15.0 g/dL   HCT 40.1 36.0 - 46.0 %   MCV 84.9 78.0 - 100.0 fl   MCHC 33.3 30.0 - 36.0 g/dL   RDW 14.9 11.5 - 15.5 %  HIV antibody  Result Value Ref Range   HIV 1&2 Ab, 4th Generation NON-REACTIVE NON-REACTI  POCT urinalysis dipstick  Result Value Ref Range   Color, UA yellow yellow   Clarity, UA cloudy (A) clear   Glucose, UA negative negative mg/dL   Bilirubin, UA negative negative   Ketones, POC UA negative negative mg/dL   Spec Grav, UA 1.020 1.010 - 1.025   Blood, UA negative negative   pH, UA 6.0 5.0 - 8.0   Protein Ur, POC trace (A) negative mg/dL   Urobilinogen, UA 0.2 0.2 or 1.0 E.U./dL   Nitrite, UA Negative Negative   Leukocytes, UA Negative Negative    Dg Chest 2 View  Result Date: 01/09/2018 CLINICAL DATA:  Fever today. Lightheaded and nausea since yesterday. Cough and congestion. Previous smoker. Hypertension. EXAM: CHEST - 2 VIEW COMPARISON:  03/26/2016 FINDINGS: The heart size and mediastinal contours are within normal limits. Both lungs are clear.  The visualized skeletal structures are unremarkable. IMPRESSION: No active cardiopulmonary disease. Electronically Signed   By: Lucienne Capers M.D.   On: 01/09/2018 00:58   Ct Abdomen Pelvis W Contrast  Result Date: 01/18/2018 CLINICAL DATA:  Fever for 2 weeks.  LEFT lower quadrant pain. EXAM: CT ABDOMEN AND PELVIS WITH CONTRAST TECHNIQUE: Multidetector CT imaging of the abdomen and pelvis was performed using the standard protocol following bolus administration of intravenous contrast. CONTRAST:   140mL ISOVUE-300 IOPAMIDOL (ISOVUE-300) INJECTION 61% COMPARISON:  None. FINDINGS: Lower chest: No acute abnormality. Hepatobiliary: No focal liver abnormality is seen. No gallstones, gallbladder wall thickening, or biliary dilatation. Hepatic steatosis. Pancreas: Unremarkable. No pancreatic ductal dilatation or surrounding inflammatory changes. Spleen: Normal in size without focal abnormality. Adrenals/Urinary Tract: Adrenal glands are unremarkable. Kidneys are normal, without renal calculi, focal lesion, or hydronephrosis. Bladder is unremarkable. Stomach/Bowel: Stomach is within normal limits. No appendiceal inflammation. No evidence for diverticulitis. No evidence of bowel wall thickening, distention, or inflammatory changes. Moderate stool burden. Vascular/Lymphatic: No significant vascular findings are present. No enlarged abdominal or pelvic lymph nodes. Reproductive: Uterus and bilateral adnexa are unremarkable. Other: No abdominal wall hernia or abnormality. No abdominopelvic ascites. Musculoskeletal: No acute or significant osseous findings. IMPRESSION: No evidence for diverticulitis or other acute intra-abdominal process. Moderate stool burden. Hepatic steatosis. Electronically Signed   By: Staci Righter M.D.   On: 01/18/2018 13:19   7/2- received her urine culture, negative Called to check on her - LMOM on her cell phone Called her home phone and spoke with her.  Temp up to 99 last night, un-medicated.  She took tylenol and improved.  AF so far today.  She is eating but feels nauseated after meals.  She has zofran to use prn. No real pain anywhere.  She started doxy last night Asked her to continue to monitor her temp 2-3x a day.  We will stay in close contact.  At this time tick borne illness is a possibility and we have started her on doxycycline

## 2018-01-18 ENCOUNTER — Encounter: Payer: Self-pay | Admitting: Family Medicine

## 2018-01-18 ENCOUNTER — Ambulatory Visit (INDEPENDENT_AMBULATORY_CARE_PROVIDER_SITE_OTHER): Payer: PPO | Admitting: Family Medicine

## 2018-01-18 ENCOUNTER — Other Ambulatory Visit (HOSPITAL_BASED_OUTPATIENT_CLINIC_OR_DEPARTMENT_OTHER): Payer: PPO

## 2018-01-18 ENCOUNTER — Ambulatory Visit (HOSPITAL_BASED_OUTPATIENT_CLINIC_OR_DEPARTMENT_OTHER)
Admission: RE | Admit: 2018-01-18 | Discharge: 2018-01-18 | Disposition: A | Discharge status: Home / Self Care | Payer: PPO | Source: Ambulatory Visit | Attending: Family Medicine | Admitting: Family Medicine

## 2018-01-18 VITALS — BP 126/72 | HR 86 | Temp 97.8°F | Resp 16 | Ht 63.0 in | Wt 200.0 lb

## 2018-01-18 DIAGNOSIS — K760 Fatty (change of) liver, not elsewhere classified: Secondary | ICD-10-CM | POA: Diagnosis not present

## 2018-01-18 DIAGNOSIS — I10 Essential (primary) hypertension: Secondary | ICD-10-CM | POA: Diagnosis not present

## 2018-01-18 DIAGNOSIS — R42 Dizziness and giddiness: Secondary | ICD-10-CM | POA: Diagnosis not present

## 2018-01-18 DIAGNOSIS — R509 Fever, unspecified: Secondary | ICD-10-CM | POA: Diagnosis not present

## 2018-01-18 DIAGNOSIS — R072 Precordial pain: Secondary | ICD-10-CM

## 2018-01-18 DIAGNOSIS — R1032 Left lower quadrant pain: Secondary | ICD-10-CM

## 2018-01-18 DIAGNOSIS — R10811 Right upper quadrant abdominal tenderness: Secondary | ICD-10-CM | POA: Diagnosis not present

## 2018-01-18 DIAGNOSIS — R609 Edema, unspecified: Secondary | ICD-10-CM

## 2018-01-18 DIAGNOSIS — E785 Hyperlipidemia, unspecified: Secondary | ICD-10-CM | POA: Diagnosis not present

## 2018-01-18 LAB — POCT URINALYSIS DIP (MANUAL ENTRY)
Bilirubin, UA: NEGATIVE
Blood, UA: NEGATIVE
Glucose, UA: NEGATIVE mg/dL
Ketones, POC UA: NEGATIVE mg/dL
LEUKOCYTES UA: NEGATIVE
Nitrite, UA: NEGATIVE
PH UA: 6 (ref 5.0–8.0)
Spec Grav, UA: 1.02 (ref 1.010–1.025)
UROBILINOGEN UA: 0.2 U/dL

## 2018-01-18 LAB — CBC
HEMATOCRIT: 40.1 % (ref 36.0–46.0)
Hemoglobin: 13.4 g/dL (ref 12.0–15.0)
MCHC: 33.3 g/dL (ref 30.0–36.0)
MCV: 84.9 fl (ref 78.0–100.0)
Platelets: 235 10*3/uL (ref 150.0–400.0)
RBC: 4.72 Mil/uL (ref 3.87–5.11)
RDW: 14.9 % (ref 11.5–15.5)
WBC: 5 10*3/uL (ref 4.0–10.5)

## 2018-01-18 MED ORDER — MECLIZINE HCL 25 MG PO TABS: 25.0000 mg | ORAL_TABLET | Freq: Two times a day (BID) | ORAL | 2 refills | Status: DC | PRN

## 2018-01-18 MED ORDER — HYDROCHLOROTHIAZIDE 25 MG PO TABS: 25.0000 mg | ORAL_TABLET | ORAL | 2 refills | Status: DC

## 2018-01-18 MED ORDER — ATORVASTATIN CALCIUM 10 MG PO TABS: 10.0000 mg | ORAL_TABLET | Freq: Every day | ORAL | 3 refills | Status: DC

## 2018-01-18 MED ORDER — METOPROLOL SUCCINATE ER 25 MG PO TB24: 25.0000 mg | ORAL_TABLET | Freq: Every day | ORAL | 3 refills | Status: DC

## 2018-01-18 MED ORDER — IOPAMIDOL (ISOVUE-300) INJECTION 61%
100.0000 mL | Freq: Once | INTRAVENOUS | Status: AC | PRN
  Administered 2018-01-18: 100 mL via INTRAVENOUS

## 2018-01-18 MED ORDER — DOXYCYCLINE HYCLATE 100 MG PO CAPS: 100.0000 mg | ORAL_CAPSULE | Freq: Two times a day (BID) | ORAL | 0 refills | Status: DC

## 2018-01-18 NOTE — Patient Instructions (Signed)
Please go to the lab, and then to the ground floor imaging dept for your CT scan. If the CT is negative we will then plan to do a gallbladder ultrasound. Please let downstairs know that we are canceling the ultrasound for now since you are going to be drinking contrast.  I will be in touch with your reports asap

## 2018-01-19 ENCOUNTER — Encounter: Payer: Self-pay | Admitting: Family Medicine

## 2018-01-20 LAB — URINE CULTURE
MICRO NUMBER:: 90781104
Result:: NO GROWTH
SPECIMEN QUALITY: ADEQUATE

## 2018-01-20 LAB — QUANTIFERON-TB GOLD PLUS
Mitogen-NIL: >10 IU/mL
NIL: 1.02 [IU]/mL
QuantiFERON-TB Gold Plus: NEGATIVE
TB1-NIL: 0.12 [IU]/mL
TB2-NIL: 0.25 IU/mL

## 2018-01-20 LAB — HIV ANTIBODY (ROUTINE TESTING W REFLEX): HIV 1&2 Ab, 4th Generation: NONREACTIVE

## 2018-01-22 ENCOUNTER — Encounter: Payer: Self-pay | Admitting: Family Medicine

## 2018-01-23 ENCOUNTER — Encounter: Payer: Self-pay | Admitting: Family Medicine

## 2018-01-23 ENCOUNTER — Other Ambulatory Visit: Payer: Self-pay | Admitting: Family Medicine

## 2018-01-23 DIAGNOSIS — R509 Fever, unspecified: Secondary | ICD-10-CM

## 2018-01-23 DIAGNOSIS — R7989 Other specified abnormal findings of blood chemistry: Secondary | ICD-10-CM

## 2018-01-23 DIAGNOSIS — M25562 Pain in left knee: Secondary | ICD-10-CM

## 2018-01-23 DIAGNOSIS — M2550 Pain in unspecified joint: Secondary | ICD-10-CM

## 2018-01-25 ENCOUNTER — Other Ambulatory Visit (INDEPENDENT_AMBULATORY_CARE_PROVIDER_SITE_OTHER): Payer: PPO

## 2018-01-25 ENCOUNTER — Ambulatory Visit (HOSPITAL_BASED_OUTPATIENT_CLINIC_OR_DEPARTMENT_OTHER)
Admission: RE | Admit: 2018-01-25 | Discharge: 2018-01-25 | Disposition: A | Discharge status: Home / Self Care | Payer: PPO | Source: Ambulatory Visit | Attending: Family Medicine | Admitting: Family Medicine

## 2018-01-25 DIAGNOSIS — M2550 Pain in unspecified joint: Secondary | ICD-10-CM | POA: Diagnosis not present

## 2018-01-25 DIAGNOSIS — R079 Chest pain, unspecified: Secondary | ICD-10-CM | POA: Diagnosis not present

## 2018-01-25 DIAGNOSIS — R509 Fever, unspecified: Secondary | ICD-10-CM | POA: Diagnosis not present

## 2018-01-25 DIAGNOSIS — R05 Cough: Secondary | ICD-10-CM | POA: Diagnosis not present

## 2018-01-25 MED ORDER — TRAMADOL HCL 50 MG PO TABS: 50.0000 mg | ORAL_TABLET | Freq: Three times a day (TID) | ORAL | 0 refills | Status: DC | PRN

## 2018-01-25 MED ORDER — IBUPROFEN 600 MG PO TABS: 600.0000 mg | ORAL_TABLET | Freq: Three times a day (TID) | ORAL | 1 refills | Status: DC | PRN

## 2018-01-25 NOTE — Addendum Note (Signed)
Addended by: Harl Bowie on: 01/25/2018 01:42 PM   Modules accepted: Orders

## 2018-01-25 NOTE — Telephone Encounter (Signed)
Added uric acid per pt request  Checked [place] and rx for tramadol given

## 2018-01-26 LAB — CBC WITH DIFFERENTIAL/PLATELET
BASOS PCT: 1.2 % (ref 0.0–3.0)
Basophils Absolute: 0.1 10*3/uL (ref 0.0–0.1)
EOS PCT: 0.6 % (ref 0.0–5.0)
Eosinophils Absolute: 0 10*3/uL (ref 0.0–0.7)
HEMATOCRIT: 39.6 % (ref 36.0–46.0)
HEMOGLOBIN: 13.2 g/dL (ref 12.0–15.0)
LYMPHS PCT: 27.2 % (ref 12.0–46.0)
Lymphs Abs: 1.4 10*3/uL (ref 0.7–4.0)
MCHC: 33.4 g/dL (ref 30.0–36.0)
MCV: 85.3 fl (ref 78.0–100.0)
MONO ABS: 0.4 10*3/uL (ref 0.1–1.0)
MONOS PCT: 8.9 % (ref 3.0–12.0)
Neutro Abs: 3.1 10*3/uL (ref 1.4–7.7)
Neutrophils Relative %: 62.1 % (ref 43.0–77.0)
Platelets: 230 10*3/uL (ref 150.0–400.0)
RBC: 4.64 Mil/uL (ref 3.87–5.11)
RDW: 15 % (ref 11.5–15.5)
WBC: 5 10*3/uL (ref 4.0–10.5)

## 2018-01-26 LAB — COMPREHENSIVE METABOLIC PANEL
ALBUMIN: 4.1 g/dL (ref 3.5–5.2)
ALK PHOS: 69 U/L (ref 39–117)
ALT: 50 U/L — ABNORMAL HIGH (ref 0–35)
AST: 39 U/L — ABNORMAL HIGH (ref 0–37)
BUN: 17 mg/dL (ref 6–23)
CALCIUM: 9.1 mg/dL (ref 8.4–10.5)
CHLORIDE: 102 meq/L (ref 96–112)
CO2: 31 mEq/L (ref 19–32)
Creatinine, Ser: 0.96 mg/dL (ref 0.40–1.20)
GFR: 76.02 mL/min (ref >60.00)
Glucose, Bld: 95 mg/dL (ref 70–99)
POTASSIUM: 3.6 meq/L (ref 3.5–5.1)
Sodium: 141 mEq/L (ref 135–145)
TOTAL PROTEIN: 7.2 g/dL (ref 6.0–8.3)
Total Bilirubin: 0.4 mg/dL (ref 0.2–1.2)

## 2018-01-26 LAB — TSH: TSH: 7.59 u[IU]/mL — ABNORMAL HIGH (ref 0.35–4.50)

## 2018-01-26 LAB — URIC ACID: Uric Acid, Serum: 7.3 mg/dL — ABNORMAL HIGH (ref 2.4–7.0)

## 2018-01-26 LAB — SEDIMENTATION RATE: SED RATE: 19 mm/h (ref 0–30)

## 2018-01-26 LAB — C-REACTIVE PROTEIN: CRP: 0.6 mg/dL (ref 0.5–20.0)

## 2018-01-28 NOTE — Addendum Note (Signed)
Addended by: Lamar Blinks C on: 01/28/2018 06:09 AM   Modules accepted: Orders

## 2018-01-29 ENCOUNTER — Other Ambulatory Visit (HOSPITAL_COMMUNITY): Payer: PPO

## 2018-01-31 LAB — CULTURE, BLOOD (SINGLE)
MICRO NUMBER:: 90804689
Result:: NO GROWTH
SPECIMEN QUALITY: ADEQUATE

## 2018-01-31 LAB — ROCKY MTN SPOTTED FVR ABS PNL(IGG+IGM)
RMSF IGM: NOT DETECTED
RMSF IgG: NOT DETECTED

## 2018-01-31 LAB — B. BURGDORFI ANTIBODIES

## 2018-02-03 ENCOUNTER — Encounter: Payer: Self-pay | Admitting: Family Medicine

## 2018-02-03 ENCOUNTER — Other Ambulatory Visit: Payer: PPO

## 2018-02-03 ENCOUNTER — Other Ambulatory Visit (INDEPENDENT_AMBULATORY_CARE_PROVIDER_SITE_OTHER): Payer: PPO

## 2018-02-03 DIAGNOSIS — R7989 Other specified abnormal findings of blood chemistry: Secondary | ICD-10-CM

## 2018-02-03 LAB — T4, FREE: Free T4: 0.75 ng/dL (ref 0.60–1.60)

## 2018-02-03 LAB — TSH: TSH: 4.08 u[IU]/mL (ref 0.35–4.50)

## 2018-02-08 ENCOUNTER — Other Ambulatory Visit (HOSPITAL_COMMUNITY): Payer: PPO

## 2018-02-09 ENCOUNTER — Other Ambulatory Visit: Payer: Self-pay

## 2018-02-09 ENCOUNTER — Ambulatory Visit (HOSPITAL_COMMUNITY): Payer: PPO | Attending: Cardiology

## 2018-02-09 DIAGNOSIS — R509 Fever, unspecified: Secondary | ICD-10-CM

## 2018-02-09 DIAGNOSIS — I071 Rheumatic tricuspid insufficiency: Secondary | ICD-10-CM | POA: Insufficient documentation

## 2018-02-09 MED ORDER — PERFLUTREN LIPID MICROSPHERE
1.0000 mL | INTRAVENOUS | Status: AC | PRN
  Administered 2018-02-09: 1 mL via INTRAVENOUS

## 2018-02-10 ENCOUNTER — Ambulatory Visit: Payer: PPO | Admitting: Family Medicine

## 2018-02-10 NOTE — Progress Notes (Deleted)
[place] at [facility] [address], [place], [place] [address] [phone]  Date:  02/10/2018   Name:  Amanda Saunders   DOB:  [birth year]   MRN:  [id number]  PCP:  Darreld Mclean, MD    Chief Complaint: No chief complaint on file.   History of Present Illness:  Amanda Saunders is a 62 y.o. very pleasant female patient who presents with the following:  Following up today for FUO ER visit on 6/21:   Pt presenting with high fever all day today with malaise and myalgias. Cough which has been persistent but no worse today. Nausea and vomiting but most likely from fever b/c pt has no abd pain. Exam is benign. No meningeal signs or rashes. Low suspicion for RMSF, measles or strep. Lungs are clear but will r/o PNA and UTI. Pt treated with fluids and given fever control. CBC, CMP, lipase, lactate, CXR and UA pending. Labs reassuring except for UA which is concerning for UTI and pt did have burning with urination. Urine culture sent. CXR, lactate and CBC, CmP wnl. Will give dose of rocephin and d/c with keflex. Pt is tolerating po's.   I saw her in the office on 01/18/18. Extensive evaluation has not revealed cause of her illness.  We did put he on a course of doxycycline to cover for RMSF/ Lyme disease.   We also got a recent echo as below which we need to discuss   She last saw cardiology in late 2017, Stanford Breed is her doc: 1 Thyroid nodule-Managed by primary care. Dedicated thyroid ultrasound is ordered. 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 No history of syncope or family history of sudden death. Follow-up echocardiograms in the future. 3 chest pain-symptoms atypical. Previous exercise treadmill nondiagnostic. Schedule Lexiscan nuclear study for risk stratification. 4 hyperlipidemia-continue statin.  Her most recent echo also suggests non- compaction: Impressions:  - Echo contrast used. 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  Have contacted Dr. Stanford Breed to schedule a visit with pt for follow-up  Patient Active Problem List   Diagnosis Date Noted  . Pre-diabetes 04/03/2016  . History of CVA (cerebrovascular accident) 03/12/2016  . Chest pain at rest 03/12/2016  . Peripheral edema 03/12/2016    Past Medical History:  Diagnosis Date  . Cardiomyopathy ([place])   . Hypertension   . PUD (peptic ulcer disease)   . Stroke ([place])   . Vertigo     Past Surgical History:  Procedure Laterality Date  . BACK SURGERY    . CARPAL TUNNEL RELEASE    . NECK SURGERY     2015    Social History   Tobacco Use  . Smoking status: Former Smoker    Last attempt to quit: 07/21/2002    Years since quitting: 15.5  . Smokeless tobacco: Never Used  Substance Use Topics  . Alcohol use: Yes    Comment: occasional  . Drug use: No    Family History  Problem Relation Age of Onset  . Hypertension Mother   . Arthritis Mother   . Heart failure Mother   . Stroke Mother   . Kidney disease Father   . Hypertension Father     Allergies  Allergen Reactions  . Codeine Nausea And Vomiting    Medication list has been reviewed and updated.  Current Outpatient Medications on File Prior to Visit  Medication Sig Dispense Refill  . aspirin 325 MG EC tablet Take 650 mg by mouth daily as needed (for chest pain episodes).     Marland Kitchen aspirin 81 MG tablet Take 81 mg by mouth daily.    Marland Kitchen atorvastatin (LIPITOR) 10 MG tablet Take 1 tablet (10 mg total) by mouth daily. 90 tablet 3  . cephALEXin (KEFLEX) 500 MG capsule Take 1 capsule (500 mg total) by mouth 4 (four) times daily. 28 capsule 0  . doxycycline (VIBRAMYCIN) 100 MG capsule Take 1 capsule (100 mg total) by mouth 2 (two) times daily. 20 capsule 0  . hydrochlorothiazide (HYDRODIURIL) 25 MG tablet Take 1 tablet (25 mg total) by mouth every other day. 45 tablet 2  .  ibuprofen (ADVIL,MOTRIN) 600 MG tablet Take 1 tablet (600 mg total) by mouth every 8 (eight) hours as needed (for back pain). 30 tablet 1  . meclizine (ANTIVERT) 25 MG tablet Take 1 tablet (25 mg total) by mouth 2 (two) times daily as needed for dizziness. 30 tablet 2  . metoprolol succinate (TOPROL-XL) 25 MG 24 hr tablet Take 1 tablet (25 mg total) by mouth daily. 90 tablet 3  . ondansetron (ZOFRAN ODT) 4 MG disintegrating tablet Take 1 tablet (4 mg total) by mouth every 8 (eight) hours as needed for nausea or vomiting. 20 tablet 0  . Pyridoxine HCl (VITAMIN B-6) 500 MG tablet Take 500 mg by mouth every other day.     . traMADol (ULTRAM) 50 MG tablet Take 1 tablet (50 mg total) by mouth every 8 (eight) hours as needed. 30 tablet 0  . vitamin B-12 (CYANOCOBALAMIN) 500 MCG tablet Take 500 mcg by mouth every other day.     . Vitamin D, Ergocalciferol, (DRISDOL) 50000 units CAPS capsule Take 1 capsule (50,000 Units total) by mouth every 7 (seven) days. 12 capsule 0   No current facility-administered medications on file prior to visit.     Review of Systems:  As per HPI- otherwise negative.   Physical Examination: There were no vitals filed for this visit. There were no vitals filed for this visit. There is no height or weight on file to calculate BMI. Ideal Body Weight:    GEN: WDWN, NAD, Non-toxic, A & O x 3 HEENT: Atraumatic, Normocephalic. Neck supple. No masses, No LAD. Ears and Nose: No external deformity. CV: RRR, No M/G/R. No JVD. No thrill. No extra heart sounds. PULM: CTA B, no wheezes, crackles, rhonchi. No retractions. No resp. distress. No accessory muscle use. ABD: S, NT, ND, +BS. No rebound. No HSM. EXTR: No c/c/e NEURO Normal gait.  PSYCH: Normally interactive. Conversant. Not depressed or anxious appearing.  Calm demeanor.    Assessment and Plan: ***  Signed Lamar Blinks, MD

## 2018-02-11 MED ORDER — COLCHICINE 0.6 MG PO TABS: ORAL_TABLET | ORAL | 0 refills | Status: DC

## 2018-02-11 NOTE — Addendum Note (Signed)
Addended by: Lamar Blinks C on: 02/11/2018 12:17 PM   Modules accepted: Orders

## 2018-02-11 NOTE — Telephone Encounter (Signed)
Called pt- she has been fever free which is great news. However she is having some joint pain esp in he hands.  Wonders about gout and indeed her uric acid was high on testing earlier this month.  Will have her try a course of colchicine and see how it does for her.  She is going to reschedule to see me asap - likely the first week of August.  Also ok to use nsaids prn for her joints

## 2018-02-17 ENCOUNTER — Encounter: Payer: Self-pay | Admitting: Family Medicine

## 2018-02-17 ENCOUNTER — Other Ambulatory Visit: Payer: Self-pay

## 2018-02-17 DIAGNOSIS — M2550 Pain in unspecified joint: Secondary | ICD-10-CM

## 2018-02-17 MED ORDER — TRAMADOL HCL 50 MG PO TABS: 50.0000 mg | ORAL_TABLET | Freq: Three times a day (TID) | ORAL | 0 refills | Status: DC | PRN

## 2018-02-17 MED ORDER — IBUPROFEN 600 MG PO TABS: 600.0000 mg | ORAL_TABLET | Freq: Three times a day (TID) | ORAL | 0 refills | Status: DC | PRN

## 2018-02-17 NOTE — Telephone Encounter (Signed)
Author phoned pt. To set up a f/u visit per Dr. Lillie Fragmin request. Appointment made for 8/7 at 1300. Pt. States her hands have been inflammed, and she forgot to take her tramadol and ibuprofen with her on vacation. Pt. states she needs six doses of each to get her through the rest of the week to be sent to [facility] in [place], [place]. Orders pended for Dr. Lillie Fragmin review.

## 2018-02-24 ENCOUNTER — Ambulatory Visit: Payer: PPO | Admitting: Family Medicine

## 2018-02-27 NOTE — Progress Notes (Addendum)
[facility] [address] [place], [place] [address] [phone]  Date:  03/01/2018   Name:  Amanda Saunders   DOB:  04/02/1956   MRN:  [id number]  PCP:  Darreld Mclean, MD    Chief Complaint: Gout (follow up) and Chest Pain (pain in arms and legs, shortness of breath, had ECHO, cardiology appointment this month)   History of Present Illness:  Amanda Saunders is a 62 y.o. very pleasant female patient who presents with the following:  Following up on gout today History of pre-diabetes and CVA, cardiomyopathy About 6 weeks ago I saw her in the office and we embarked on a long [place] work- up All turned out ok except for mildly increased LFT and uric acid.  Pt also had complaint of joint pains- we tried colchicine but this did not help so we presume not gout.   She has had temp up to 99.3- nothing higher over the last several weeks She has noted a right sided chest pain- can be dull or sharp.  Can less frequently occur on the left  She has noted this for the last 3 months or so.  Allegra thought she had mentioned this to me, but she actually had not until today. We did get an echo for her recently as part of her FUO work-up:  Study Conclusions - Left ventricle: The cavity size was normal. Systolic function was   mildly reduced. The estimated ejection fraction was in the range   of 45% to 50%. Diffuse mild hypokinesis. Prominent LV mid to   apical trabeculation, cocnerning for LV noncompaction. Images   appear similar to prior echo and cardiac MRI of 2017. Acoustic   contrast opacification revealed prominent trabeculations but no   evidence of large thrombus. - Aortic valve: There was no significant regurgitation. - Aorta: The aorta was moderately calcified. - Mitral valve: There was trivial regurgitation. - Tricuspid valve: There was mild regurgitation. - Pulmonary arteries: Systolic pressure was within the normal    range. PA peak pressure: 28 mm Hg (S).  Impressions:  - Echo contrast used. 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    appt 8/29 She did do a myoview in 05/2016 which was ok  The pain may last for about 2 seconds. It is not exertional She may notice this several times a day She does notice SOB for the last 3 months as well- also not exertional  We did a CXR on 7/8 that was negative   Pt notes that she is having pain and stiffness in the joints in both arms, from her shoulders to her hands, and the joints seem to be swollen.   We did try colchicine but his did not help at all  Consider an auto-immune process  We did do a sed rate and CRP, but otherwise have not done work- up for RA   Patient Active Problem List   Diagnosis Date Noted  . Pre-diabetes 04/03/2016  . History of CVA (cerebrovascular accident) 03/12/2016  . Chest pain at rest 03/12/2016  . Peripheral edema 03/12/2016    Past Medical History:  Diagnosis Date  . Cardiomyopathy ([place])   . Hypertension   . PUD (peptic ulcer disease)   . Stroke ([place])   .  Vertigo     Past Surgical History:  Procedure Laterality Date  . BACK SURGERY    . CARPAL TUNNEL RELEASE    . NECK SURGERY     2015    Social History   Tobacco Use  . Smoking status: Former Smoker    Last attempt to quit: 07/21/2002    Years since quitting: 15.6  . Smokeless tobacco: Never Used  Substance Use Topics  . Alcohol use: Yes    Comment: occasional  . Drug use: No    Family History  Problem Relation Age of Onset  . Hypertension Mother   . Arthritis Mother   . Heart failure Mother   . Stroke Mother   . Kidney disease Father   . Hypertension Father     Allergies  Allergen Reactions  . Codeine Nausea And Vomiting    Medication list has been reviewed and  updated.  Current Outpatient Medications on File Prior to Visit  Medication Sig Dispense Refill  . aspirin 325 MG EC tablet Take 650 mg by mouth daily as needed (for chest pain episodes).     Marland Kitchen aspirin 81 MG tablet Take 81 mg by mouth daily.    Marland Kitchen atorvastatin (LIPITOR) 10 MG tablet Take 1 tablet (10 mg total) by mouth daily. 90 tablet 3  . ibuprofen (ADVIL,MOTRIN) 600 MG tablet Take 1 tablet (600 mg total) by mouth every 8 (eight) hours as needed (for back pain). 30 tablet 0  . meclizine (ANTIVERT) 25 MG tablet Take 1 tablet (25 mg total) by mouth 2 (two) times daily as needed for dizziness. 30 tablet 2  . metoprolol succinate (TOPROL-XL) 25 MG 24 hr tablet Take 1 tablet (25 mg total) by mouth daily. 90 tablet 3  . ondansetron (ZOFRAN ODT) 4 MG disintegrating tablet Take 1 tablet (4 mg total) by mouth every 8 (eight) hours as needed for nausea or vomiting. 20 tablet 0  . traMADol (ULTRAM) 50 MG tablet Take 1 tablet (50 mg total) by mouth every 8 (eight) hours as needed. 30 tablet 0  . vitamin B-12 (CYANOCOBALAMIN) 500 MCG tablet Take 500 mcg by mouth every other day.     . Vitamin D, Ergocalciferol, (DRISDOL) 50000 units CAPS capsule Take 1 capsule (50,000 Units total) by mouth every 7 (seven) days. 12 capsule 0  . colchicine 0.6 MG tablet Take 2 pills, then 1 more and hour later.  Repeat for next gout attack.  Hold atorvastatin for 48 hours following dose (Patient not taking: Reported on 03/01/2018) 15 tablet 0   No current facility-administered medications on file prior to visit.     Review of Systems:  As per HPI- otherwise negative. No significant fever No chills Still does not feel back to her normal self No facial rash or oral sores   Physical Examination: Vitals:   03/01/18 1032  BP: 130/70  Pulse: 89  Resp: 16  Temp: 98.1 F (36.7 C)  SpO2: 97%   Vitals:   03/01/18 1032  Weight: 197 lb 9.6 oz (89.6 kg)  Height: 5\' 3"  (1.6 m)   Body mass index is 35 kg/m. Ideal Body  Weight: Weight in (lb) to have BMI = 25: 140.8  GEN: WDWN, NAD, Non-toxic, A & O x 3, obese, looks well  HEENT: Atraumatic, Normocephalic. Neck supple. No masses, No LAD.  Bilateral TM wnl, oropharynx normal.  PEERL,EOMI.   Ears and Nose: No external deformity. CV: RRR, No M/G/R. No JVD. No thrill. No extra heart sounds.  I am able to reproduce her CP by pressing on her chest wall  PULM: CTA B, no wheezes, crackles, rhonchi. No retractions. No resp. distress. No accessory muscle use. ABD: S, NT, ND, +BS. No rebound. No HSM. EXTR: No c/c/e NEURO Normal gait.  PSYCH: Normally interactive. Conversant. Not depressed or anxious appearing.  Calm demeanor.  Note tenderness and mild puffiness in the MCP joints of both hands.  She also has complaint of tenderness with ROM at elbows and shoulders, wrists   EKG:  SR- not normal per machine but not acute findings. Compared with past EKG from 9/17.  She is seeing Dr. Stanford Breed at the end of this month. Discussed EKG with him and he agrees no acutely concerning findings  Assessment and Plan: Joint swelling - Plan: Rheumatoid Factor, Cyclic citrul peptide antibody, IgG, ANA, C-reactive protein, Sedimentation rate, predniSONE (DELTASONE) 20 MG tablet, Lupus anticoagulant, CANCELED: Lupus anticoagulant panel, CANCELED: Lupus anticoagulant panel  Chest discomfort - Plan: EKG 12-Lead, Troponin I  Arthralgia, unspecified joint - Plan: Rheumatoid Factor, Cyclic citrul peptide antibody, IgG, ANA, C-reactive protein, Sedimentation rate, predniSONE (DELTASONE) 20 MG tablet, CANCELED: Lupus anticoagulant panel, CANCELED: Lupus anticoagulant panel  Low grade fever - Plan: C-reactive protein, Sedimentation rate, Lupus anticoagulant, CANCELED: Lupus anticoagulant panel, CANCELED: Lupus anticoagulant panel  Transaminitis - Plan: Hepatic function panel  Peripheral edema - Plan: hydrochlorothiazide (HYDRODIURIL) 25 MG tablet  Here today for a follow-up visit Last seen  here about 6 weeks ago when she was having fevers, cause unknown.  We did an extensive work up including echocardiogram Her fevers are now resolved except for temps in the 99s, but she is now having UE joint pains and feels that her joints are swollen.  Will begin labs to look for RA, lupus.   Will have her try a short course of prednisone to see if this may be helpful Chest pains and SOB for 3 months Exam and EKG reassuring today Recnet normal CXR She is seeing cardiology in 2 weeks Will obtain troponin today as well   Signed Lamar Blinks, MD  Received her labs 8/15, message to pt  Results for orders placed or performed in visit on 03/01/18  Rheumatoid Factor  Result Value Ref Range   Rhuematoid fact SerPl-aCnc <51 <76 IU/mL  Cyclic citrul peptide antibody, IgG  Result Value Ref Range   Cyclic Citrullin Peptide Ab <16 UNITS  ANA  Result Value Ref Range   Anti Nuclear Antibody(ANA) POSITIVE (A) NEGATIVE  C-reactive protein  Result Value Ref Range   CRP 0.6 0.5 - 20.0 mg/dL  Sedimentation rate  Result Value Ref Range   Sed Rate 12 0 - 30 mm/hr  Troponin I  Result Value Ref Range   TNIDX 0.00 0.00 - 0.06 ug/l  Hepatic function panel  Result Value Ref Range   Total Bilirubin 0.3 0.2 - 1.2 mg/dL   Bilirubin, Direct 0.1 0.0 - 0.3 mg/dL   Alkaline Phosphatase 78 39 - 117 U/L   AST 48 (H) 0 - 37 U/L   ALT 76 (H) 0 - 35 U/L   Total Protein 7.7 6.0 - 8.3 g/dL   Albumin 4.3 3.5 - 5.2 g/dL  Lupus anticoagulant  Result Value Ref Range   dPT 34.0 0.0 - 55.0 sec   dPT Confirm Ratio 0.88 0.00 - 1.40 Ratio   Thrombin Time 19.9 0.0 - 23.0 sec   PTT Lupus Anticoagulant 32.6 0.0 - 51.9 sec   [place] Venom Time 29.1 0.0 -  47.0 sec   Lupus Anticoag Interp Comment:   Anti-nuclear ab-titer (ANA titer)  Result Value Ref Range   ANA Pattern 1 SPECKLED (A)    ANA Titer 1 > OR = 1:1280 (A) titer    Most of your labs look ok- your lupus anticoagulant test is negative and your  rheumatoid factor/ CCP (which we use to look for RA) are negative.  However, your ANA test result came back quite high.  The ANA does not signify any particular illness, but it being so high is suggestive that you have some sort of auto-immune process going on.    Did the prednisone seem to help you?    Also, your liver function tests are slightly elevated. This is likely due to fat in your liver that was noted on the CT scan we did last month.   I think we should have you see rheumatology for a consultation. They may be able to help [place] figure out if your ANA is truly significant and what it might mean.  I will make this referral for you.    Please let me know how you are doing. Let's please meet back in 2-3 months to check on your liver tests

## 2018-03-01 ENCOUNTER — Ambulatory Visit (INDEPENDENT_AMBULATORY_CARE_PROVIDER_SITE_OTHER): Payer: PPO | Admitting: Family Medicine

## 2018-03-01 ENCOUNTER — Encounter: Payer: Self-pay | Admitting: Family Medicine

## 2018-03-01 VITALS — BP 130/70 | HR 89 | Temp 98.1°F | Resp 16 | Ht 63.0 in | Wt 197.6 lb

## 2018-03-01 DIAGNOSIS — M2550 Pain in unspecified joint: Secondary | ICD-10-CM

## 2018-03-01 DIAGNOSIS — M2540 Effusion, unspecified joint: Secondary | ICD-10-CM

## 2018-03-01 DIAGNOSIS — R609 Edema, unspecified: Secondary | ICD-10-CM | POA: Diagnosis not present

## 2018-03-01 DIAGNOSIS — R509 Fever, unspecified: Secondary | ICD-10-CM

## 2018-03-01 DIAGNOSIS — R0789 Other chest pain: Secondary | ICD-10-CM | POA: Diagnosis not present

## 2018-03-01 DIAGNOSIS — R7401 Elevation of levels of liver transaminase levels: Secondary | ICD-10-CM

## 2018-03-01 DIAGNOSIS — R740 Nonspecific elevation of levels of transaminase and lactic acid dehydrogenase [LDH]: Secondary | ICD-10-CM

## 2018-03-01 DIAGNOSIS — R768 Other specified abnormal immunological findings in serum: Secondary | ICD-10-CM

## 2018-03-01 DIAGNOSIS — R600 Localized edema: Secondary | ICD-10-CM

## 2018-03-01 LAB — HEPATIC FUNCTION PANEL
ALBUMIN: 4.3 g/dL (ref 3.5–5.2)
ALK PHOS: 78 U/L (ref 39–117)
ALT: 76 U/L — ABNORMAL HIGH (ref 0–35)
AST: 48 U/L — ABNORMAL HIGH (ref 0–37)
Bilirubin, Direct: 0.1 mg/dL (ref 0.0–0.3)
Total Bilirubin: 0.3 mg/dL (ref 0.2–1.2)
Total Protein: 7.7 g/dL (ref 6.0–8.3)

## 2018-03-01 LAB — TROPONIN I: TNIDX: 0 ug/L (ref 0.00–0.06)

## 2018-03-01 LAB — C-REACTIVE PROTEIN: CRP: 0.6 mg/dL (ref 0.5–20.0)

## 2018-03-01 LAB — SEDIMENTATION RATE: Sed Rate: 12 mm/hr (ref 0–30)

## 2018-03-01 MED ORDER — PREDNISONE 20 MG PO TABS: ORAL_TABLET | ORAL | 0 refills | Status: DC

## 2018-03-01 MED ORDER — HYDROCHLOROTHIAZIDE 25 MG PO TABS: 25.0000 mg | ORAL_TABLET | ORAL | 3 refills | Status: DC

## 2018-03-01 NOTE — Patient Instructions (Addendum)
It was good to see you today- I will be in touch with your labs asap  Let's try a low dose of prednisone for your hands/ joints, I would like to see if this may be helpful for you  I don't think that your chest pains are due to your heart- however, if this is changing or getting worse please do seek care right away! Otherwise we will have you see cardiology at the end of this month as planned

## 2018-03-02 LAB — LUPUS ANTICOAGULANT
DPT CONFIRM RATIO: 0.88 ratio (ref 0.00–1.40)
DPT: 34 s (ref 0.0–55.0)
DRVVT: 29.1 s (ref 0.0–47.0)
PTT LA: 32.6 s (ref 0.0–51.9)
Thrombin Time: 19.9 s (ref 0.0–23.0)

## 2018-03-03 ENCOUNTER — Telehealth: Payer: Self-pay | Admitting: *Deleted

## 2018-03-03 LAB — CYCLIC CITRUL PEPTIDE ANTIBODY, IGG: Cyclic Citrullin Peptide Ab: <16 UNITS

## 2018-03-03 LAB — RHEUMATOID FACTOR

## 2018-03-03 LAB — ANA: Anti Nuclear Antibody(ANA): POSITIVE — AB

## 2018-03-03 LAB — ANTI-NUCLEAR AB-TITER (ANA TITER): ANA Titer 1: >=1:1280 {titer} — AB

## 2018-03-03 NOTE — Telephone Encounter (Signed)
Received Lab Report results from [facility]; forwarded to provider/SLS 08/14

## 2018-03-04 ENCOUNTER — Encounter: Payer: Self-pay | Admitting: Family Medicine

## 2018-03-04 NOTE — Addendum Note (Signed)
Addended by: Lamar Blinks C on: 03/04/2018 05:58 AM   Modules accepted: Orders

## 2018-03-05 ENCOUNTER — Encounter: Payer: Self-pay | Admitting: Family Medicine

## 2018-03-05 DIAGNOSIS — M2550 Pain in unspecified joint: Secondary | ICD-10-CM

## 2018-03-05 MED ORDER — PREDNISONE 10 MG PO TABS: 10.0000 mg | ORAL_TABLET | Freq: Every day | ORAL | 0 refills | Status: DC

## 2018-03-11 DIAGNOSIS — R768 Other specified abnormal immunological findings in serum: Secondary | ICD-10-CM | POA: Diagnosis not present

## 2018-03-11 DIAGNOSIS — E669 Obesity, unspecified: Secondary | ICD-10-CM | POA: Diagnosis not present

## 2018-03-11 DIAGNOSIS — A689 Relapsing fever, unspecified: Secondary | ICD-10-CM | POA: Diagnosis not present

## 2018-03-11 DIAGNOSIS — R0602 Shortness of breath: Secondary | ICD-10-CM | POA: Diagnosis not present

## 2018-03-11 DIAGNOSIS — Z6834 Body mass index (BMI) 34.0-34.9, adult: Secondary | ICD-10-CM | POA: Diagnosis not present

## 2018-03-11 DIAGNOSIS — M7989 Other specified soft tissue disorders: Secondary | ICD-10-CM | POA: Diagnosis not present

## 2018-03-11 DIAGNOSIS — R079 Chest pain, unspecified: Secondary | ICD-10-CM | POA: Diagnosis not present

## 2018-03-11 DIAGNOSIS — M2550 Pain in unspecified joint: Secondary | ICD-10-CM | POA: Diagnosis not present

## 2018-03-15 ENCOUNTER — Emergency Department (HOSPITAL_COMMUNITY)
Admission: EM | Admit: 2018-03-15 | Discharge: 2018-03-16 | Disposition: A | Discharge status: Home / Self Care | Payer: PPO | Attending: Emergency Medicine | Admitting: Emergency Medicine

## 2018-03-15 ENCOUNTER — Emergency Department (HOSPITAL_COMMUNITY): Payer: PPO

## 2018-03-15 ENCOUNTER — Other Ambulatory Visit: Payer: Self-pay

## 2018-03-15 ENCOUNTER — Encounter (HOSPITAL_COMMUNITY): Payer: Self-pay | Admitting: Emergency Medicine

## 2018-03-15 DIAGNOSIS — I10 Essential (primary) hypertension: Secondary | ICD-10-CM | POA: Diagnosis not present

## 2018-03-15 DIAGNOSIS — Z7982 Long term (current) use of aspirin: Secondary | ICD-10-CM | POA: Insufficient documentation

## 2018-03-15 DIAGNOSIS — Z8673 Personal history of transient ischemic attack (TIA), and cerebral infarction without residual deficits: Secondary | ICD-10-CM | POA: Diagnosis not present

## 2018-03-15 DIAGNOSIS — R0600 Dyspnea, unspecified: Secondary | ICD-10-CM | POA: Diagnosis not present

## 2018-03-15 DIAGNOSIS — Z79899 Other long term (current) drug therapy: Secondary | ICD-10-CM | POA: Diagnosis not present

## 2018-03-15 DIAGNOSIS — Z87891 Personal history of nicotine dependence: Secondary | ICD-10-CM | POA: Insufficient documentation

## 2018-03-15 DIAGNOSIS — R0602 Shortness of breath: Secondary | ICD-10-CM | POA: Diagnosis not present

## 2018-03-15 DIAGNOSIS — R0789 Other chest pain: Secondary | ICD-10-CM | POA: Insufficient documentation

## 2018-03-15 DIAGNOSIS — R079 Chest pain, unspecified: Secondary | ICD-10-CM | POA: Diagnosis not present

## 2018-03-15 LAB — I-STAT TROPONIN, ED: TROPONIN I, POC: 0.03 ng/mL (ref 0.00–0.08)

## 2018-03-15 NOTE — ED Triage Notes (Addendum)
Pt reports she was taking a nap when her husband woke her up, she sat up and had a sharp strong cp to the mid, left chest that radiates down under her left arm to her fingers with tingling sensation. Pt reports associated nausea and sob. Respirations regular and unlabored. No neuro deficits noted.

## 2018-03-15 NOTE — Progress Notes (Signed)
HPI: FU cardiomyopathy. 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 Carotid Dopplers November 2017 showed 1-49% bilateral stenosis. Nuclear study November 2017 showed no infarct or ischemia.  LV function mildly reduced.  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  Patient seen in the emergency room August 2019 with chest pain.  Chest x-ray negative.  Troponins normal.  Hemoglobin normal.  Since last seen,  patient states her chest pain was substernal and occurred after sitting up.  It lasted 1 to 2 seconds and resolved.  However it was recurrent.  No radiation or associated symptoms.  No exertional chest pain.  Mild dyspnea on exertion.  Current Outpatient Medications  Medication Sig Dispense Refill  . aspirin 81 MG tablet Take 81 mg by mouth daily.    Marland Kitchen atorvastatin (LIPITOR) 10 MG tablet Take 1 tablet (10 mg total) by mouth daily. 90 tablet 3  . hydrochlorothiazide (HYDRODIURIL) 25 MG tablet Take 1 tablet (25 mg total) by mouth every other day. 45 tablet 3  . meclizine (ANTIVERT) 25 MG tablet Take 1 tablet (25 mg total) by mouth 2 (two) times daily as needed for dizziness. 30 tablet 2  . metoprolol succinate (TOPROL-XL) 25 MG 24 hr tablet Take 1 tablet (25 mg total) by mouth daily. 90 tablet 3  . naproxen sodium (ANAPROX) 550 MG tablet Take 1 tablet (550 mg total) by mouth 2 (two) times daily with a meal. 30 tablet 0  . traMADol (ULTRAM) 50 MG tablet Take 1 tablet (50 mg total) by mouth every 8 (eight) hours as needed. 30 tablet 0   No current facility-administered medications for this visit.      Past Medical History:  Diagnosis Date  . Cardiomyopathy ([place])   . Hypertension   . PUD (peptic ulcer disease)   . Stroke ([place])   . Vertigo     Past Surgical History:  Procedure  Laterality Date  . BACK SURGERY    . CARPAL TUNNEL RELEASE    . NECK SURGERY     2015    Social History   Socioeconomic History  . Marital status: Single    Spouse name: Not on file  . Number of children: 1  . Years of education: Not on file  . Highest education level: Not on file  Occupational History    Comment: retired  Scientific laboratory technician  . Financial resource strain: Not on file  . Food insecurity:    Worry: Not on file    Inability: Not on file  . Transportation needs:    Medical: Not on file    Non-medical: Not on file  Tobacco Use  . Smoking status: Former Smoker    Last attempt to quit: 07/21/2002    Years since quitting: 15.6  . Smokeless tobacco: Never Used  Substance and Sexual Activity  . Alcohol use: Yes    Comment: occasional  . Drug use: No  . Sexual activity: Not on file  Lifestyle  . Physical activity:    Days per week: Not on file    Minutes per session: Not on file  . Stress: Not on file  Relationships  . Social connections:    Talks on phone: Not on file    Gets together: Not on file    Attends religious service: Not on file    Active member  of club or organization: Not on file    Attends meetings of clubs or organizations: Not on file    Relationship status: Not on file  . Intimate partner violence:    Fear of current or ex partner: Not on file    Emotionally abused: Not on file    Physically abused: Not on file    Forced sexual activity: Not on file  Other Topics Concern  . Not on file  Social History Narrative  . Not on file    Family History  Problem Relation Age of Onset  . Hypertension Mother   . Arthritis Mother   . Heart failure Mother   . Stroke Mother   . Kidney disease Father   . Hypertension Father     ROS: no fevers or chills, productive cough, hemoptysis, dysphasia, odynophagia, melena, hematochezia, dysuria, hematuria, rash, seizure activity, orthopnea, PND, pedal edema, claudication. Remaining systems are  negative.  Physical Exam: Well-developed well-nourished in no acute distress.  Skin is warm and dry.  HEENT is normal.  Neck is supple.  Chest is clear to auscultation with normal expansion.  Cardiovascular exam is regular rate and rhythm.  Abdominal exam nontender or distended. No masses palpated. Extremities show no edema. 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  No family history of sudden death.  Continue beta-blocker.  Plan: Check MRI to further assess.  2 cardiomyopathy-mildly reduced LV function. Continue beta-blocker.  3 hyperlipidemia-continue statin.  4 chest pain-symptoms are extremely atypical.  Enzymes were negative.  No further ischemia evaluation.  Kirk Ruths, MD

## 2018-03-16 LAB — BASIC METABOLIC PANEL
Anion gap: 6 (ref 5–15)
BUN: 14 mg/dL (ref 6–20)
CHLORIDE: 104 mmol/L (ref 98–111)
CO2: 28 mmol/L (ref 22–32)
CREATININE: 1.03 mg/dL — AB (ref 0.44–1.00)
Calcium: 8.3 mg/dL — ABNORMAL LOW (ref 8.9–10.3)
GFR calc Af Amer: >60 mL/min (ref >60)
GFR calc non Af Amer: 58 mL/min — ABNORMAL LOW (ref >60)
GLUCOSE: 115 mg/dL — AB (ref 70–99)
POTASSIUM: 3.6 mmol/L (ref 3.5–5.1)
SODIUM: 138 mmol/L (ref 135–145)

## 2018-03-16 LAB — CBC
HCT: 39.5 % (ref 36.0–46.0)
Hemoglobin: 12.8 g/dL (ref 12.0–15.0)
MCH: 28.6 pg (ref 26.0–34.0)
MCHC: 32.4 g/dL (ref 30.0–36.0)
MCV: 88.2 fL (ref 78.0–100.0)
PLATELETS: 207 10*3/uL (ref 150–400)
RBC: 4.48 MIL/uL (ref 3.87–5.11)
RDW: 14.4 % (ref 11.5–15.5)
WBC: 9.1 10*3/uL (ref 4.0–10.5)

## 2018-03-16 LAB — I-STAT TROPONIN, ED: Troponin i, poc: 0.02 ng/mL (ref 0.00–0.08)

## 2018-03-16 MED ORDER — IBUPROFEN 800 MG PO TABS
800.0000 mg | ORAL_TABLET | Freq: Once | ORAL | Status: AC
  Administered 2018-03-16: 800 mg via ORAL
  Filled 2018-03-16: qty 1

## 2018-03-16 MED ORDER — NAPROXEN SODIUM 550 MG PO TABS: 550.0000 mg | ORAL_TABLET | Freq: Two times a day (BID) | ORAL | 0 refills | Status: DC

## 2018-03-16 NOTE — ED Notes (Signed)
emt collecting labs.

## 2018-03-16 NOTE — ED Notes (Signed)
Pt reports waking up at 2100 last night with L cp that has moved under her R breast.  She endorses SOB and dizziness with the cp.  She reports having an ECHO done recently d/t previous cp.  She is A&Ox 4 and in NAD.  She describes the pain as sharp shooting pain.

## 2018-03-16 NOTE — ED Provider Notes (Signed)
[facility] EMERGENCY DEPARTMENT Provider Note   CSN: [id number] Arrival date & time: 03/15/18  2307     History   Chief Complaint Chief Complaint  Patient presents with  . Chest Pain  . Shortness of Breath    HPI Amanda Saunders is a 62 y.o. female.  The history is provided by the patient.  She has a history of hypertension, prediabetes, stroke, cardiomyopathy comes in because chest pains.  She describes sharp, shooting pain which only lasts a few seconds at a time.  There is associated dyspnea but no nausea or diaphoresis.  Nothing makes the pain better, nothing makes it worse.  She has had similar pains in the past and she normally takes aspirin 650 mg and the pain goes away.  She took her aspirin today, but the pain has not changed.  She denies history of hyperlipidemia.  She is a non-smoker.  She denies family history of premature coronary atherosclerosis.  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  She has been having chest pain during this time and continue to have chest pain while on prednisone, even though her other symptoms improved dramatically.  She has been off prednisone for 1 week.  She is scheduled to see her cardiologist next week.  Past Medical History:  Diagnosis Date  . Cardiomyopathy ([place])   . Hypertension   . PUD (peptic ulcer disease)   . Stroke ([place])   . Vertigo     Patient Active Problem List   Diagnosis Date Noted  . Pre-diabetes 04/03/2016  . History of CVA (cerebrovascular accident) 03/12/2016  . Chest pain at rest 03/12/2016  . Peripheral edema 03/12/2016    Past Surgical History:  Procedure Laterality Date  . BACK SURGERY    . CARPAL TUNNEL RELEASE    . NECK SURGERY     2015     OB History   None      Home Medications    Prior to Admission medications   Medication Sig Start Date End Date Taking? Authorizing Provider    aspirin 81 MG tablet Take 81 mg by mouth daily.   Yes [provider]  atorvastatin (LIPITOR) 10 MG tablet Take 1 tablet (10 mg total) by mouth daily. 01/18/18  Yes Copland, Gay Filler, MD  hydrochlorothiazide (HYDRODIURIL) 25 MG tablet Take 1 tablet (25 mg total) by mouth every other day. 03/01/18  Yes Copland, Gay Filler, MD  meclizine (ANTIVERT) 25 MG tablet Take 1 tablet (25 mg total) by mouth 2 (two) times daily as needed for dizziness. 01/18/18  Yes Copland, Gay Filler, MD  metoprolol succinate (TOPROL-XL) 25 MG 24 hr tablet Take 1 tablet (25 mg total) by mouth daily. 01/18/18  Yes Copland, Gay Filler, MD  traMADol (ULTRAM) 50 MG tablet Take 1 tablet (50 mg total) by mouth every 8 (eight) hours as needed. 02/17/18  Yes Copland, Gay Filler, MD  colchicine 0.6 MG tablet Take 2 pills, then 1 more and hour later.  Repeat for next gout attack.  Hold atorvastatin for 48 hours following dose Patient not taking: Reported on 03/01/2018 02/11/18   Copland, Gay Filler, MD  ibuprofen (ADVIL,MOTRIN) 600 MG tablet Take 1 tablet (600 mg total) by mouth every 8 (eight) hours as needed (for back pain). Patient not taking: Reported on 03/16/2018 02/17/18   Copland, Gay Filler, MD  ondansetron (ZOFRAN ODT) 4 MG disintegrating tablet Take 1 tablet (4 mg  total) by mouth every 8 (eight) hours as needed for nausea or vomiting. Patient not taking: Reported on 03/16/2018 01/09/18   Blanchie Dessert, MD  predniSONE (DELTASONE) 10 MG tablet Take 1 tablet (10 mg total) by mouth daily with breakfast. Take for 7 days, then save rest of rx Patient not taking: Reported on 03/16/2018 03/05/18   Copland, Gay Filler, MD  predniSONE (DELTASONE) 20 MG tablet Take 2 pills a day for 3 days, then 1 pill a day for 3 days Patient not taking: Reported on 03/16/2018 03/01/18   Copland, Gay Filler, MD  Vitamin D, Ergocalciferol, (DRISDOL) 50000 units CAPS capsule Take 1 capsule (50,000 Units total) by mouth every 7 (seven) days. Patient not taking:  Reported on 03/16/2018 04/03/16   Copland, Gay Filler, MD    Family History Family History  Problem Relation Age of Onset  . Hypertension Mother   . Arthritis Mother   . Heart failure Mother   . Stroke Mother   . Kidney disease Father   . Hypertension Father     Social History Social History   Tobacco Use  . Smoking status: Former Smoker    Last attempt to quit: 07/21/2002    Years since quitting: 15.6  . Smokeless tobacco: Never Used  Substance Use Topics  . Alcohol use: Yes    Comment: occasional  . Drug use: No     Allergies   Codeine   Review of Systems Review of Systems  All other systems reviewed and are negative.    Physical Exam Updated Vital Signs BP 119/70   Pulse 66   Temp 98.3 F (36.8 C) (Oral)   Resp 19   Ht 5\' 3"  (1.6 m)   Wt 89.4 kg   SpO2 98%   BMI 34.90 kg/m   Physical Exam  Nursing note and vitals reviewed.  62 year old female, resting comfortably and in no acute distress. Vital signs are normal. Oxygen saturation is 98%, which is normal. Head is normocephalic and atraumatic. PERRLA, EOMI. Oropharynx is clear. Neck is nontender and supple without adenopathy or JVD. Back is nontender and there is no CVA tenderness. Lungs are clear without rales, wheezes, or rhonchi. Chest is nontender. Heart has regular rate and rhythm without murmur. Abdomen is soft, flat, nontender without masses or hepatosplenomegaly and peristalsis is normoactive. Extremities have no cyanosis or edema, full range of motion is present. Skin is warm and dry without rash. Neurologic: Mental status is normal, cranial nerves are intact, there are no motor or sensory deficits.  ED Treatments / Results  Labs (all labs ordered are listed, but only abnormal results are displayed) Labs Reviewed  BASIC METABOLIC PANEL - Abnormal; Notable for the following components:      Result Value   Glucose, Bld 115 (*)    Creatinine, Ser 1.03 (*)    Calcium 8.3 (*)    GFR calc non  Af Amer 58 (*)    All other components within normal limits  CBC  I-STAT TROPONIN, ED    EKG EKG Interpretation  Date/Time:  Monday March 15 2018 23:14:45 EDT Ventricular Rate:  66 PR Interval:  164 QRS Duration: 76 QT Interval:  404 QTC Calculation: 423 R Axis:   4 Text Interpretation:  Normal sinus rhythm Cannot rule out Anterior infarct , age undetermined Abnormal ECG When compared with ECG of 03/26/2016, No significant change was found Confirmed by Delora Fuel ([id number]) on 03/15/2018 11:16:42 PM   Radiology Dg Chest 2 View  Result Date: 03/15/2018 CLINICAL DATA:  62 year old female with chest pain EXAM: CHEST - 2 VIEW COMPARISON:  Chest radiograph dated 01/25/2018 FINDINGS: The heart size and mediastinal contours are within normal limits. Both lungs are clear. The visualized skeletal structures are unremarkable. IMPRESSION: No active cardiopulmonary disease. Electronically Signed   By: Anner Crete M.D.   On: 03/15/2018 23:36    Procedures Procedures   Medications Ordered in ED Medications  ibuprofen (ADVIL,MOTRIN) tablet 800 mg (has no administration in time range)     Initial Impression / Assessment and Plan / ED Course  I have reviewed the triage vital signs and the nursing notes.  Pertinent labs & imaging results that were available during my care of the patient were reviewed by me and considered in my medical decision making (see chart for details).  Chest pain which is quite atypical.  Heart score is 3, which puts her at low risk for major adverse cardiac events in the next 6 weeks.  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  She had an low risk nuclear stress test in 2017, but with reduced ejection fraction.  Today, ECG is unremarkable, chest x-ray is unremarkable, troponin is normal.  Will check delta troponin - if  negative, will discharge to follow-up with her cardiologist as scheduled.  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  Delta troponin has decreased.  She is discharged with prescription for naproxen.  She is to keep her appointment with her cardiologist.  Return precautions discussed.  Final Clinical Impressions(s) / ED Diagnoses   Final diagnoses:  Atypical chest pain    ED Discharge Orders         Ordered    naproxen sodium (ANAPROX) 550 MG tablet  2 times daily with meals     03/16/18 7829           Delora Fuel, MD 56/21/30 [phone]

## 2018-03-16 NOTE — Discharge Instructions (Addendum)
Return if symptoms are getting worse. °

## 2018-03-18 ENCOUNTER — Telehealth: Payer: Self-pay

## 2018-03-18 ENCOUNTER — Encounter: Payer: Self-pay | Admitting: Cardiology

## 2018-03-18 ENCOUNTER — Ambulatory Visit: Payer: PPO | Admitting: Cardiology

## 2018-03-18 VITALS — BP 118/68 | HR 63 | Ht 63.0 in | Wt 197.0 lb

## 2018-03-18 DIAGNOSIS — R072 Precordial pain: Secondary | ICD-10-CM | POA: Diagnosis not present

## 2018-03-18 DIAGNOSIS — I428 Other cardiomyopathies: Secondary | ICD-10-CM

## 2018-03-18 NOTE — Telephone Encounter (Signed)
ED follow up call made to patient to see if she needed appointment scheduled with Dr.J Copland. patient states she was seen by her Cardiologist today who has her scheduled for procedures in the near future. States she does not need to be seen by PCP right now.

## 2018-03-18 NOTE — Patient Instructions (Signed)
Your physician wants you to follow-up in: 6 MONTHS WITH DR CRENSHAW You will receive a reminder letter in the mail two months in advance. If you don't receive a letter, please call our office to schedule the follow-up appointment.   If you need a refill on your cardiac medications before your next appointment, please call your pharmacy.  

## 2018-03-23 DIAGNOSIS — R079 Chest pain, unspecified: Secondary | ICD-10-CM | POA: Diagnosis not present

## 2018-03-23 DIAGNOSIS — M329 Systemic lupus erythematosus, unspecified: Secondary | ICD-10-CM | POA: Diagnosis not present

## 2018-03-23 DIAGNOSIS — A689 Relapsing fever, unspecified: Secondary | ICD-10-CM | POA: Diagnosis not present

## 2018-03-23 DIAGNOSIS — E669 Obesity, unspecified: Secondary | ICD-10-CM | POA: Diagnosis not present

## 2018-03-23 DIAGNOSIS — Z6835 Body mass index (BMI) 35.0-35.9, adult: Secondary | ICD-10-CM | POA: Diagnosis not present

## 2018-03-23 DIAGNOSIS — M7989 Other specified soft tissue disorders: Secondary | ICD-10-CM | POA: Diagnosis not present

## 2018-03-23 DIAGNOSIS — R768 Other specified abnormal immunological findings in serum: Secondary | ICD-10-CM | POA: Diagnosis not present

## 2018-03-23 DIAGNOSIS — R0602 Shortness of breath: Secondary | ICD-10-CM | POA: Diagnosis not present

## 2018-03-23 DIAGNOSIS — M2550 Pain in unspecified joint: Secondary | ICD-10-CM | POA: Diagnosis not present

## 2018-06-03 ENCOUNTER — Other Ambulatory Visit: Payer: Self-pay

## 2018-06-03 ENCOUNTER — Emergency Department (HOSPITAL_COMMUNITY)
Admission: EM | Admit: 2018-06-03 | Discharge: 2018-06-03 | Disposition: A | Discharge status: Home / Self Care | Payer: PPO | Attending: Emergency Medicine | Admitting: Emergency Medicine

## 2018-06-03 ENCOUNTER — Emergency Department (HOSPITAL_COMMUNITY): Payer: PPO

## 2018-06-03 DIAGNOSIS — R112 Nausea with vomiting, unspecified: Secondary | ICD-10-CM | POA: Insufficient documentation

## 2018-06-03 DIAGNOSIS — Z87891 Personal history of nicotine dependence: Secondary | ICD-10-CM | POA: Insufficient documentation

## 2018-06-03 DIAGNOSIS — Z79899 Other long term (current) drug therapy: Secondary | ICD-10-CM | POA: Diagnosis not present

## 2018-06-03 DIAGNOSIS — Z7982 Long term (current) use of aspirin: Secondary | ICD-10-CM | POA: Insufficient documentation

## 2018-06-03 DIAGNOSIS — R1110 Vomiting, unspecified: Secondary | ICD-10-CM | POA: Diagnosis not present

## 2018-06-03 DIAGNOSIS — I10 Essential (primary) hypertension: Secondary | ICD-10-CM | POA: Insufficient documentation

## 2018-06-03 DIAGNOSIS — R0602 Shortness of breath: Secondary | ICD-10-CM | POA: Insufficient documentation

## 2018-06-03 DIAGNOSIS — Z8673 Personal history of transient ischemic attack (TIA), and cerebral infarction without residual deficits: Secondary | ICD-10-CM | POA: Insufficient documentation

## 2018-06-03 LAB — COMPREHENSIVE METABOLIC PANEL
ALT: 46 U/L — ABNORMAL HIGH (ref 0–44)
AST: 40 U/L (ref 15–41)
Albumin: 4.2 g/dL (ref 3.5–5.0)
Alkaline Phosphatase: 62 U/L (ref 38–126)
Anion gap: 8 (ref 5–15)
BUN: 17 mg/dL (ref 8–23)
CHLORIDE: 106 mmol/L (ref 98–111)
CO2: 29 mmol/L (ref 22–32)
Calcium: 8.9 mg/dL (ref 8.9–10.3)
Creatinine, Ser: 1.01 mg/dL — ABNORMAL HIGH (ref 0.44–1.00)
GFR, EST NON AFRICAN AMERICAN: 59 mL/min — AB (ref >60)
Glucose, Bld: 111 mg/dL — ABNORMAL HIGH (ref 70–99)
POTASSIUM: 3.7 mmol/L (ref 3.5–5.1)
Sodium: 143 mmol/L (ref 135–145)
Total Bilirubin: 0.8 mg/dL (ref 0.3–1.2)
Total Protein: 7.4 g/dL (ref 6.5–8.1)

## 2018-06-03 LAB — URINALYSIS, ROUTINE W REFLEX MICROSCOPIC
Bilirubin Urine: NEGATIVE
Glucose, UA: NEGATIVE mg/dL
Hgb urine dipstick: NEGATIVE
Ketones, ur: NEGATIVE mg/dL
LEUKOCYTES UA: NEGATIVE
NITRITE: NEGATIVE
PH: 6 (ref 5.0–8.0)
Protein, ur: NEGATIVE mg/dL
SPECIFIC GRAVITY, URINE: 1.027 (ref 1.005–1.030)

## 2018-06-03 LAB — LIPASE, BLOOD: LIPASE: 25 U/L (ref 11–51)

## 2018-06-03 LAB — CBC
HEMATOCRIT: 37.8 % (ref 36.0–46.0)
HEMOGLOBIN: 11.9 g/dL — AB (ref 12.0–15.0)
MCH: 27.8 pg (ref 26.0–34.0)
MCHC: 31.5 g/dL (ref 30.0–36.0)
MCV: 88.3 fL (ref 80.0–100.0)
NRBC: 0 % (ref 0.0–0.2)
Platelets: 263 10*3/uL (ref 150–400)
RBC: 4.28 MIL/uL (ref 3.87–5.11)
RDW: 14.1 % (ref 11.5–15.5)
WBC: 6.6 10*3/uL (ref 4.0–10.5)

## 2018-06-03 LAB — I-STAT TROPONIN, ED: Troponin i, poc: 0.01 ng/mL (ref 0.00–0.08)

## 2018-06-03 MED ORDER — LIDOCAINE VISCOUS HCL 2 % MT SOLN
15.0000 mL | Freq: Once | OROMUCOSAL | Status: AC
  Administered 2018-06-03: 15 mL via ORAL
  Filled 2018-06-03: qty 15

## 2018-06-03 MED ORDER — ALUM & MAG HYDROXIDE-SIMETH 200-200-20 MG/5ML PO SUSP
30.0000 mL | Freq: Once | ORAL | Status: AC
  Administered 2018-06-03: 30 mL via ORAL
  Filled 2018-06-03: qty 30

## 2018-06-03 MED ORDER — PROMETHAZINE HCL 25 MG/ML IJ SOLN
12.5000 mg | Freq: Once | INTRAMUSCULAR | Status: AC
  Administered 2018-06-03: 12.5 mg via INTRAVENOUS
  Filled 2018-06-03: qty 1

## 2018-06-03 MED ORDER — ONDANSETRON 4 MG PO TBDP
4.0000 mg | ORAL_TABLET | Freq: Once | ORAL | Status: AC | PRN
  Administered 2018-06-03: 4 mg via ORAL
  Filled 2018-06-03: qty 1

## 2018-06-03 MED ORDER — SODIUM CHLORIDE 0.9 % IV BOLUS
1000.0000 mL | Freq: Once | INTRAVENOUS | Status: AC
  Administered 2018-06-03: 1000 mL via INTRAVENOUS

## 2018-06-03 NOTE — ED Triage Notes (Signed)
Pt to ed with c/o if Emesis for the past 30 min. Pt states she had made tea at home and now she is throwing everything and cant keep anything down. Pt also states her tongue [place] numb.  Pt is A&O x4 and ambulatory.

## 2018-06-03 NOTE — ED Notes (Signed)
EKG given to EDP,Messick,MD., for review. 

## 2018-06-03 NOTE — ED Provider Notes (Signed)
[facility] DEPT Provider Note   CSN: [id number] Arrival date & time: 06/03/18  1729     History   Chief Complaint Chief Complaint  Patient presents with  . Emesis    HPI Amanda Saunders is a 62 y.o. female with past medical history significant for hypertension, PUD, CVA, chronic chest pain who presents for evaluation of emesis.  Patient states she was at home and made of a glass of tea.  Patient states she drank a glass of tea and has had persistent nonbloody, nonbilious emesis since.  Admits to shortness of breath with one episode of emesis. Patient states "I was throwing up so much I could not catch my breath".  States her niece did also drink the tea and does not have any symptoms. Denies fever, chills, headache, vision changes, weakness, numbness or tingling in her extremities, chest pain, abdominal pain, dysuria or constipation.  Patient denies sensation of tongue or throat swelling.  History obtained from patient.  No interpreter was used.  HPI  Past Medical History:  Diagnosis Date  . Cardiomyopathy ([place])   . Hypertension   . PUD (peptic ulcer disease)   . Stroke ([place])   . Vertigo     Patient Active Problem List   Diagnosis Date Noted  . Pre-diabetes 04/03/2016  . History of CVA (cerebrovascular accident) 03/12/2016  . Chest pain at rest 03/12/2016  . Peripheral edema 03/12/2016    Past Surgical History:  Procedure Laterality Date  . BACK SURGERY    . CARPAL TUNNEL RELEASE    . NECK SURGERY     2015     OB History   None      Home Medications    Prior to Admission medications   Medication Sig Start Date End Date Taking? Authorizing Provider  aspirin 81 MG tablet Take 81 mg by mouth daily.   Yes [provider]  atorvastatin (LIPITOR) 10 MG tablet Take 1 tablet (10 mg total) by mouth daily. 01/18/18  Yes Copland, Gay Filler, MD  hydrochlorothiazide (HYDRODIURIL) 25 MG tablet Take 1 tablet (25 mg total) by mouth  every other day. 03/01/18  Yes Copland, Gay Filler, MD  hydroxychloroquine (PLAQUENIL) 200 MG tablet Take 200 mg by mouth 2 (two) times daily. 05/21/18  Yes [provider]  meclizine (ANTIVERT) 25 MG tablet Take 1 tablet (25 mg total) by mouth 2 (two) times daily as needed for dizziness. 01/18/18  Yes Copland, Gay Filler, MD  metoprolol succinate (TOPROL-XL) 25 MG 24 hr tablet Take 1 tablet (25 mg total) by mouth daily. 01/18/18  Yes Copland, Gay Filler, MD  naproxen sodium (ANAPROX) 550 MG tablet Take 1 tablet (550 mg total) by mouth 2 (two) times daily with a meal. Patient taking differently: Take 550 mg by mouth daily as needed for mild pain.  0/53/97  Yes Delora Fuel, MD  traMADol (ULTRAM) 50 MG tablet Take 1 tablet (50 mg total) by mouth every 8 (eight) hours as needed. Patient not taking: Reported on 06/03/2018 02/17/18   Copland, Gay Filler, MD    Family History Family History  Problem Relation Age of Onset  . Hypertension Mother   . Arthritis Mother   . Heart failure Mother   . Stroke Mother   . Kidney disease Father   . Hypertension Father     Social History Social History   Tobacco Use  . Smoking status: Former Smoker    Last attempt to quit: 07/21/2002    Years  since quitting: 15.8  . Smokeless tobacco: Never Used  Substance Use Topics  . Alcohol use: Yes    Comment: occasional  . Drug use: No     Allergies   Codeine   Review of Systems Review of Systems  Constitutional: Negative.   HENT: Negative for congestion, dental problem, drooling, ear discharge, ear pain, facial swelling, hearing loss, mouth sores, nosebleeds, postnasal drip, rhinorrhea, sinus pressure, sinus pain, sneezing, sore throat, tinnitus, trouble swallowing and voice change.   Respiratory: Positive for shortness of breath. Negative for cough, choking, chest tightness, wheezing and stridor.   Cardiovascular: Negative.   Gastrointestinal: Positive for nausea and vomiting. Negative for abdominal  distention, abdominal pain, anal bleeding, blood in stool, constipation, diarrhea and rectal pain.  Genitourinary: Negative.   Musculoskeletal: Negative.   Skin: Negative.   Neurological: Negative.   All other systems reviewed and are negative.    Physical Exam Updated Vital Signs BP 118/61   Pulse 67   Temp 98.9 F (37.2 C) (Oral)   Resp (!) 24   Ht 5\' 3"  (1.6 m)   Wt 90.3 kg   SpO2 99%   BMI 35.25 kg/m   Physical Exam  Constitutional: Vital signs are normal. She appears well-developed and well-nourished.  Non-toxic appearance. She does not have a sickly appearance. She does not appear ill. No distress.  HENT:  Head: Normocephalic and atraumatic.  Right Ear: Tympanic membrane, external ear and ear canal normal. Tympanic membrane is not scarred, not perforated, not erythematous, not retracted and not bulging.  Left Ear: Tympanic membrane, external ear and ear canal normal. Tympanic membrane is not scarred, not perforated, not erythematous, not retracted and not bulging.  Nose: Nose normal. Right sinus exhibits no maxillary sinus tenderness and no frontal sinus tenderness. Left sinus exhibits no maxillary sinus tenderness and no frontal sinus tenderness.  Mouth/Throat: Uvula is midline, oropharynx is clear and moist and mucous membranes are normal. No oral lesions. No trismus in the jaw. Normal dentition. No dental abscesses, uvula swelling, lacerations or dental caries. No oropharyngeal exudate, posterior oropharyngeal edema, posterior oropharyngeal erythema or tonsillar abscesses. No tonsillar exudate.  Eyes: Pupils are equal, round, and reactive to light.  Neck: Trachea normal, normal range of motion, full passive range of motion without pain and phonation normal. Neck supple. No neck rigidity. No edema, no erythema and normal range of motion present.  Cardiovascular: Normal rate, regular rhythm, normal heart sounds, intact distal pulses and normal pulses. Exam reveals no gallop and  no friction rub.  No murmur heard. Pulmonary/Chest: Effort normal and breath sounds normal. No accessory muscle usage or stridor. No tachypnea. No respiratory distress. She has no decreased breath sounds. She has no wheezes. She has no rhonchi. She has no rales. She exhibits no tenderness, no crepitus, no edema and no swelling.  Abdominal: Soft. Normal appearance and bowel sounds are normal. She exhibits no shifting dullness, no distension, no pulsatile liver, no fluid wave, no abdominal bruit, no ascites, no pulsatile midline mass and no mass. There is no tenderness. There is no rigidity, no rebound, no guarding, no CVA tenderness, no tenderness at McBurney's point and negative Murphy's sign. No hernia.  Musculoskeletal: Normal range of motion.  Neurological: She is alert. She has normal strength. No cranial nerve deficit or sensory deficit.  Skin: Skin is warm, dry and intact. No abrasion, no bruising, no ecchymosis, no lesion, no petechiae and no rash noted. She is not diaphoretic. No erythema. No pallor.  Psychiatric:  She has a normal mood and affect.  Nursing note and vitals reviewed.    ED Treatments / Results  Labs (all labs ordered are listed, but only abnormal results are displayed) Labs Reviewed  COMPREHENSIVE METABOLIC PANEL - Abnormal; Notable for the following components:      Result Value   Glucose, Bld 111 (*)    Creatinine, Ser 1.01 (*)    ALT 46 (*)    GFR calc non Af Amer 59 (*)    All other components within normal limits  CBC - Abnormal; Notable for the following components:   Hemoglobin 11.9 (*)    All other components within normal limits  LIPASE, BLOOD  URINALYSIS, ROUTINE W REFLEX MICROSCOPIC  I-STAT TROPONIN, ED    EKG EKG Interpretation  Date/Time:  Thursday June 03 2018 21:20:52 EST Ventricular Rate:  75 PR Interval:    QRS Duration: 66 QT Interval:  509 QTC Calculation: 569 R Axis:   -2 Text Interpretation:  Sinus rhythm Nonspecific T abnrm,  anterolateral leads Prolonged QT interval lateral t wave changes new since last tracing Confirmed by Dorie Rank [phone]) on 06/03/2018 9:40:31 PM   Radiology Dg Chest 2 View  Result Date: 06/03/2018 CLINICAL DATA:  Acute set of vomiting and tongue numbness. EXAM: CHEST - 2 VIEW COMPARISON:  Chest radiograph performed 03/15/2018 FINDINGS: The lungs are well-aerated and clear. There is no evidence of focal opacification, pleural effusion or pneumothorax. The heart is normal in size; the mediastinal contour is within normal limits. No acute osseous abnormalities are seen. IMPRESSION: No acute cardiopulmonary process seen. Electronically Signed   By: Garald Balding M.D.   On: 06/03/2018 22:24    Procedures Procedures (including critical care time)  Medications Ordered in ED Medications  ondansetron (ZOFRAN-ODT) disintegrating tablet 4 mg (4 mg Oral Given 06/03/18 1742)  alum & mag hydroxide-simeth (MAALOX/MYLANTA) 200-200-20 MG/5ML suspension 30 mL (30 mLs Oral Given 06/03/18 2033)    And  lidocaine (XYLOCAINE) 2 % viscous mouth solution 15 mL (15 mLs Oral Given 06/03/18 2033)  promethazine (PHENERGAN) injection 12.5 mg (12.5 mg Intravenous Given 06/03/18 2033)  sodium chloride 0.9 % bolus 1,000 mL (1,000 mLs Intravenous New Bag/Given 06/03/18 2032)     Initial Impression / Assessment and Plan / ED Course  I have reviewed the triage vital signs and the nursing notes.  Pertinent labs & imaging results that were available during my care of the patient were reviewed by me and considered in my medical decision making (see chart for details).  62 year old female who appears otherwise well who presents for evaluation of sudden onset emesis. Afebrile, nonseptic, non-ill-appearing.  Admits to episode of shortness of breath with 1 episode of emesis.  Episode began after drinking ice tea.  Other friend members have had the tea of not had any symptoms.  Denies marijuana use.  Denies fever, headache,  vision changes, chest pain, abdominal pain, dysuria, diarrhea.  Will obtain labs, urine and give fluids and antiemetics and reevaluate.  Urinalysis without evidence of infection, metabolic panel with mild elevation of glucose at 111, lipase 25, CBC without evidence of leukocytosis. Troponin negative. Chest x-ray without evidence of cardiopulmonary disease.  EKG without any evidence of ischemic changes.  She does have a prolonged QT interval, however I feel this is most likely due to the Zofran and Phenergan she has received in the department.  Patient has not had any episodes of emesis or nausea in the department.  Able to tolerate p.o. intake without  difficulty.  Patient requesting DC home at this time.   Low suspicion for emergent pathology causing patient's emesis.  Patient is nontoxic, nonseptic appearing, in no apparent distress. Patient does not meet the SIRS or Sepsis criteria.  On repeat exam patient does not have a surgical abdomin and there are no peritoneal signs.  No indication of appendicitis, bowel obstruction, bowel perforation, cholecystitis, diverticulitis.  Low suspicion for atypical ACS causing emesis.  She is hemodynamically stable and appropriate for DC home at this time.  Patient discharged home with symptomatic treatment and given strict instructions for follow-up with their primary care physician.  I have also discussed reasons to return immediately to the ER.  Patient expresses understanding and agrees with plan.    Final Clinical Impressions(s) / ED Diagnoses   Final diagnoses:  Non-intractable vomiting with nausea, unspecified vomiting type    ED Discharge Orders    None       Kassidie Hendriks A, PA-C 06/03/18 2243    Dorie Rank, MD 06/04/18 [phone]

## 2018-06-03 NOTE — Discharge Instructions (Signed)
Evaluated today for emesis.  Work-up was negative in the department.  Emesis resolved with Phenergan.  Please follow-up with PCP for reevaluation.  Return to the ED for any worsening symptoms.

## 2018-06-08 ENCOUNTER — Other Ambulatory Visit: Payer: Self-pay | Admitting: *Deleted

## 2018-06-08 NOTE — Patient Outreach (Addendum)
[facility]) Care Management  06/08/2018  Nikhita Mentzel June 26, 1956 [id number]  TELEPHONE SCREENING Referral date:06/07/18 Referral source: [place]  Utilization Management Department Referral reason: Medication copay assistance Insurance: [employer] PPO Plan I  Assessment: Telephone call to patient regarding request for assistance with copay for hydroxychloroquine 200 mg            Unable to reach patient. HIPAA compliant voice message left with call back phone number.  PLAN: Will route [employer] unsuccessful outreach letter to [place] Management clinicla poll for mailing to patient's home address. RNCM will attempt second telephone outreach to patient within 4 business days if no return call from patient.  Barrington Ellison RN,CCM,CDE [place] Management Coordinator Office Phone [phone] Office Fax [phone]

## 2018-06-09 ENCOUNTER — Encounter: Payer: Self-pay | Admitting: *Deleted

## 2018-06-09 NOTE — Patient Outreach (Signed)
[facility]) Care Management  06/09/2018  Amanda Saunders 1956/07/20 [id number]   Unsuccessful outreach letter routed to [place] Management clinical pool to be mailed to patient's home address.  Barrington Ellison RN,CCM,CDE [place] Management Coordinator Office Phone [phone] Office Fax [phone]

## 2018-06-14 ENCOUNTER — Other Ambulatory Visit: Payer: Self-pay | Admitting: *Deleted

## 2018-06-14 NOTE — Patient Outreach (Signed)
[facility]) Care Management  06/14/2018  Amanda Saunders March 11, 1956 [id number]   TELEPHONE SCREENING Referral date:06/07/18 Referral source: [employer] Utilization Management Referral reason: Medication copay assistance Insurance: [employer] PPO Plan I  Assessment: Second telephone call to patient regarding request for assistance with copay for hydroxychloroquine 200 mg            Unable to reach patient. HIPAA compliant voice message left with call back phone number.  PLAN: RNCM will attempt third telephone outreach to patient within 3-4 business days if no return call from patient.  Barrington Ellison RN,CCM,CDE [place] Management Coordinator Office Phone [phone] Office Fax [phone]

## 2018-06-14 NOTE — Patient Outreach (Addendum)
[facility]) Care Management  06/14/2018  Amanda Saunders [birth year] [id number]   TELEPHONE SCREENING Referral date:06/07/18 [facility] Referral reason:Medication copay assistance Insurance:[employer] PPO Plan I   Returned call to patient after she left voice mail at 1:41 pm today returning this RNCM's earlier call. Left another voice mail with request for patient to return call in order to complete telephone screening and discuss medication copay assistance.  Barrington Ellison RN,CCM,CDE [place] Management Coordinator Office Phone [phone] Office Fax [phone]

## 2018-06-18 ENCOUNTER — Ambulatory Visit: Payer: Self-pay | Admitting: *Deleted

## 2018-06-18 ENCOUNTER — Other Ambulatory Visit: Payer: Self-pay | Admitting: *Deleted

## 2018-06-18 NOTE — Patient Outreach (Addendum)
[facility]) Care Management  06/18/2018  Ana Woodroof 12-Sep-1955 [id number]  TELEPHONE SCREENING Referral date:06/07/18 Referral source:[facility] Utilization Management Department Referral reason:Medication copay assistance Insurance:[employer] PPO Plan I  Subjective: Successful telephone outreach to Mrs. Pinette. Telephone screening completed. She is requesting financial assistance with her hydroxycholoroquine.  She also reports medication adverse side effects of : sleepiness, vision changes, and headache.  Plan: Referral to [facility] pharmacist for medication copay assistance evaluation and medication adverse effects discussion.  Barrington Ellison RN,CCM,CDE [facility] Management Coordinator Office Phone [phone] Office Fax [phone]

## 2018-06-21 ENCOUNTER — Other Ambulatory Visit: Payer: Self-pay | Admitting: Pharmacist

## 2018-06-21 ENCOUNTER — Ambulatory Visit: Payer: Self-pay | Admitting: Pharmacist

## 2018-06-24 NOTE — Patient Outreach (Signed)
[facility]) Care Management  [place]   06/24/2018  Amanda Saunders 04-Jan-1956 [id number]   Reason for referral: medication assistance  Referral source:  [facility] DM RN Current insurance: HTA   PMHx: h/o CVA, RA   HPI: Successful outreach to Amanda Saunders regarding medication assistance.  HIPAA identifiers verified x2.  Patient agreeable to review medications telephonically.  She states she can afford most of her generics, however hydroxychlorquine is $8.50/month.  Patient states she is unsure if she has LIS/extra help.  She states she did not qualify for Medicaid.  Patient reports compliance with medications.  Objective: Lab Results  Component Value Date   CREATININE 1.01 (H) 06/03/2018   CREATININE 1.03 (H) 03/15/2018   CREATININE 0.96 01/25/2018    Lab Results  Component Value Date   HGBA1C 6.5 11/03/2016    Lipid Panel     Component Value Date/Time   CHOL 223 (H) 05/14/2016 1115   TRIG 123.0 05/14/2016 1115   HDL 50.90 05/14/2016 1115   CHOLHDL 4 05/14/2016 1115   VLDL 24.6 05/14/2016 1115   LDLCALC 148 (H) 05/14/2016 1115    BP Readings from Last 3 Encounters:  06/03/18 113/64  03/18/18 118/68  03/16/18 116/73    Allergies  Allergen Reactions  . Codeine Nausea And Vomiting    Medications Reviewed Today    Reviewed by Lavera Guise, [facility] (Pharmacist) on 06/22/18 at 1617  Med List Status: <None>  Medication Order Taking? Sig Documenting Provider Last Dose Status Informant  aspirin 81 MG tablet [id number] Yes Take 81 mg by mouth daily. [provider] Taking Active Self  atorvastatin (LIPITOR) 10 MG tablet [id number] Yes Take 1 tablet (10 mg total) by mouth daily. Copland, Gay Filler, MD Taking Active Self  hydrochlorothiazide (HYDRODIURIL) 25 MG tablet [id number] Yes Take 1 tablet (25 mg total) by mouth every other day. Copland, Gay Filler, MD Taking Active Self  hydroxychloroquine (PLAQUENIL) 200 MG tablet [id number] Yes Take  200 mg by mouth 2 (two) times daily. [provider] Taking Active Self  meclizine (ANTIVERT) 25 MG tablet [id number] Yes Take 1 tablet (25 mg total) by mouth 2 (two) times daily as needed for dizziness. Copland, Gay Filler, MD Taking Active Self  metoprolol succinate (TOPROL-XL) 25 MG 24 hr tablet [id number] Yes Take 1 tablet (25 mg total) by mouth daily. Copland, Gay Filler, MD Taking Active Self          Assessment:  Drugs sorted by system:  Cardiovascular: aspirin, atorvastatin, HCTZ, metoprolol  Gastrointestinal: meclizine  Miscellaneous: hydroxychloroquine  Medication Assistance Findings:  Extra Help:   _0  Already receiving Full Extra Help  _1  Already receiving Partial Extra Help (LIS-1)  _2  Eligible based on reported income and assets  _3  Not Eligible based on reported income and assets  Patient Assistance Programs: 1) Patient on all generic medications which do not offer patient assistance programs   Additional medication assistance options reviewed with patient as warranted:  -Patient counseled to fill a 90-day supply for the same 74-monthcopay of $8.50 -Patient is not in the coverage gap   Plan: -Will call pharmacy to ensure billing is done correctly -Will close this case as goals have been met.  [facility] if assistance needed in the future.    Thank you for allowing [facility] to be involved in this patient's care.     JRegina Eck PharmD, [place] [phone]

## 2018-06-28 ENCOUNTER — Ambulatory Visit: Payer: Self-pay | Admitting: Pharmacist

## 2018-08-02 ENCOUNTER — Ambulatory Visit (INDEPENDENT_AMBULATORY_CARE_PROVIDER_SITE_OTHER): Payer: Medicare Other | Admitting: Family Medicine

## 2018-08-02 ENCOUNTER — Other Ambulatory Visit (HOSPITAL_COMMUNITY)
Admission: RE | Admit: 2018-08-02 | Discharge: 2018-08-02 | Disposition: A | Discharge status: Home / Self Care | Payer: Medicare Other | Source: Ambulatory Visit | Attending: Family Medicine | Admitting: Family Medicine

## 2018-08-02 ENCOUNTER — Encounter: Payer: Self-pay | Admitting: Family Medicine

## 2018-08-02 VITALS — BP 116/70 | HR 66 | Temp 98.2°F | Resp 16 | Ht 63.0 in | Wt 198.0 lb

## 2018-08-02 DIAGNOSIS — R5383 Other fatigue: Secondary | ICD-10-CM

## 2018-08-02 DIAGNOSIS — Z113 Encounter for screening for infections with a predominantly sexual mode of transmission: Secondary | ICD-10-CM | POA: Insufficient documentation

## 2018-08-02 DIAGNOSIS — J309 Allergic rhinitis, unspecified: Secondary | ICD-10-CM | POA: Diagnosis not present

## 2018-08-02 DIAGNOSIS — J358 Other chronic diseases of tonsils and adenoids: Secondary | ICD-10-CM

## 2018-08-02 LAB — CBC
HCT: 38.4 % (ref 36.0–46.0)
Hemoglobin: 13 g/dL (ref 12.0–15.0)
MCHC: 33.7 g/dL (ref 30.0–36.0)
MCV: 84.2 fl (ref 78.0–100.0)
Platelets: 248 10*3/uL (ref 150.0–400.0)
RBC: 4.57 Mil/uL (ref 3.87–5.11)
RDW: 14.3 % (ref 11.5–15.5)
WBC: 6.1 10*3/uL (ref 4.0–10.5)

## 2018-08-02 NOTE — Patient Instructions (Signed)
It was good to see you today, but I am sorry you are having a hard time. I will be in touch with your labs ASAP.  For your throat, please try some over-the-counter Zyrtec or Claritin as needed.  Please let me know if this is not helpful, or if your symptoms are getting worse.

## 2018-08-02 NOTE — Progress Notes (Addendum)
[place] at [facility] [address], [place], [place] [address] [phone] [id number]  Date:  08/02/2018   Name:  Amanda Saunders   DOB:  1956/03/25   MRN:  [id number]  PCP:  Darreld Mclean, MD    Chief Complaint: Tonsil Stones (coughing, sore thorat, "white things came up" when coughing, spots on tonsils) and Fatigue (tiredness more often, would like to be tested HIV)   History of Present Illness:  Amanda Saunders is a 63 y.o. very pleasant female patient who presents with the following:  History of CVA She separated from her husband last week.   She would like an HIV test and other STI as her partner was unfaithful She is not really sure how long he may have been unfaithful, but notes that it has been "a long time"  She has noted some ST for a month or so and then coughed out some foul tasting white material from her tonsils.  This has not reaccumulated. 2 weeks ago she had a temp to 101, no further temperature since that time She has noted some increase of her temps to 99 in the evening She notes a slight cough No runny nose She does have sneezing-quite a bit No urinary symptoms, no abdominal pain.  Also vaginal itching or 3 weeks No discharge noted  She last urinated over an hour ago   Patient Active Problem List   Diagnosis Date Noted  . Pre-diabetes 04/03/2016  . History of CVA (cerebrovascular accident) 03/12/2016  . Chest pain at rest 03/12/2016  . Peripheral edema 03/12/2016    Past Medical History:  Diagnosis Date  . Cardiomyopathy ([place])   . Hypertension   . PUD (peptic ulcer disease)   . Stroke ([place])   . Vertigo     Past Surgical History:  Procedure Laterality Date  . BACK SURGERY    . CARPAL TUNNEL RELEASE    . NECK SURGERY     2015    Social History   Tobacco Use  . Smoking status: Former Smoker    Last attempt to quit: 07/21/2002    Years since quitting: 16.0  . Smokeless tobacco: Never Used   Substance Use Topics  . Alcohol use: Yes    Comment: occasional  . Drug use: No    Family History  Problem Relation Age of Onset  . Hypertension Mother   . Arthritis Mother   . Heart failure Mother   . Stroke Mother   . Kidney disease Father   . Hypertension Father     Allergies  Allergen Reactions  . Codeine Nausea And Vomiting    Medication list has been reviewed and updated.  Current Outpatient Medications on File Prior to Visit  Medication Sig Dispense Refill  . aspirin 81 MG tablet Take 81 mg by mouth daily.    Marland Kitchen atorvastatin (LIPITOR) 10 MG tablet Take 1 tablet (10 mg total) by mouth daily. 90 tablet 3  . hydrochlorothiazide (HYDRODIURIL) 25 MG tablet Take 1 tablet (25 mg total) by mouth every other day. 45 tablet 3  . hydroxychloroquine (PLAQUENIL) 200 MG tablet Take 200 mg by mouth 2 (two) times daily.    . meclizine (ANTIVERT) 25 MG tablet Take 1 tablet (25 mg total) by mouth 2 (two) times daily as needed for dizziness. 30 tablet 2  . metoprolol succinate (TOPROL-XL) 25 MG 24 hr tablet Take 1 tablet (25 mg total) by mouth daily. 90 tablet 3  No current facility-administered medications on file prior to visit.     Review of Systems:  As per HPI- otherwise negative. No current fever  Physical Examination: Vitals:   08/02/18 0858 08/02/18 0915  BP: 116/70   Pulse: (!) 103 66  Resp: 16   Temp: 98.2 F (36.8 C)   SpO2: 98%    Vitals:   08/02/18 0858  Weight: 198 lb (89.8 kg)  Height: 5\' 3"  (1.6 m)   Body mass index is 35.07 kg/m. Ideal Body Weight: Weight in (lb) to have BMI = 25: 140.8  Repeat pulse normal GEN: WDWN, NAD, Non-toxic, A & O x 3, obese, looks well HEENT: Atraumatic, Normocephalic. Neck supple. No masses, No LAD. Bilateral TM wnl, oropharynx normal.  PEERL,EOMI.   No current tonsillitis, tonsils are at a normal small size for an adult Ears and Nose: No external deformity. CV: RRR, No M/G/R. No JVD. No thrill. No extra heart  sounds. PULM: CTA B, no wheezes, crackles, rhonchi. No retractions. No resp. distress. No accessory muscle use. ABD: S, NT, ND. No rebound. No HSM. EXTR: No c/c/e NEURO Normal gait.  PSYCH: Normally interactive. Conversant. Not depressed or anxious appearing.  Calm demeanor.    Assessment and Plan: Routine screening for STI (sexually transmitted infection) - Plan: Hepatitis B surface antibody,quantitative, Hepatitis B surface antigen, Hepatitis C antibody, HIV Antibody (routine testing w rflx), HSV(herpes simplex vrs) 1+2 ab-IgG, C. trachomatis/N. gonorrhoeae RNA, Cervicovaginal ancillary only( [facility])  Other fatigue - Plan: CBC  Allergic rhinitis, unspecified seasonality, unspecified trigger  Tonsillolith  Here today seeking STI screening.  Unfortunately it seems that her husband has been unfaithful, and she is concerned about STI.  Will screen as above. She has been feeling tired, we will check a CBC for her.  She has also noticed some low-grade fevers, but none for the last couple of weeks. Suspect that her sneezing and sore throat is likely due to allergies.  Suggested that she try an over-the-counter Zyrtec or Claritin as needed.  She will let me know if not helpful. It sounds as though she has had tonsilloliths, we discussed these.  Right now she does not have any evidence of tonsolith  Signed Lamar Blinks, MD  1/14 addendum, received labs as below.  Message to patient  Results for orders placed or performed in visit on 08/02/18  C. trachomatis/N. gonorrhoeae RNA  Result Value Ref Range   C. trachomatis RNA, TMA NOT DETECTED NOT DETECT   N. gonorrhoeae RNA, TMA NOT DETECTED NOT DETECT  Hepatitis B surface antibody,quantitative  Result Value Ref Range   Hepatitis B-Post <5 (L) > OR = 10 mIU/mL  Hepatitis B surface antigen  Result Value Ref Range   Hepatitis B Surface Ag NON-REACTIVE NON-REACTI  Hepatitis C antibody  Result Value Ref Range   Hepatitis C Ab  NON-REACTIVE NON-REACTI   SIGNAL TO CUT-OFF 0.01 <1.00  HIV Antibody (routine testing w rflx)  Result Value Ref Range   HIV 1&2 Ab, 4th Generation NON-REACTIVE NON-REACTI  HSV(herpes simplex vrs) 1+2 ab-IgG  Result Value Ref Range   HAV 1 IGG,TYPE SPECIFIC AB 39.60 (H) index   HSV 2 IGG,TYPE SPECIFIC AB <0.90 index  CBC  Result Value Ref Range   WBC 6.1 4.0 - 10.5 K/uL   RBC 4.57 3.87 - 5.11 Mil/uL   Platelets 248.0 150.0 - 400.0 K/uL   Hemoglobin 13.0 12.0 - 15.0 g/dL   HCT 38.4 36.0 - 46.0 %   MCV 84.2  78.0 - 100.0 fl   MCHC 33.7 30.0 - 36.0 g/dL   RDW 14.3 11.5 - 15.5 %  Cervicovaginal ancillary only( [place])  Result Value Ref Range   Bacterial vaginitis Negative for Bacterial Vaginitis Microorganisms    Candida vaginitis **POSITIVE for Candida species** (A)    Trichomonas Negative

## 2018-08-03 ENCOUNTER — Encounter: Payer: Self-pay | Admitting: Family Medicine

## 2018-08-03 DIAGNOSIS — R42 Dizziness and giddiness: Secondary | ICD-10-CM

## 2018-08-03 DIAGNOSIS — B373 Candidiasis of vulva and vagina: Secondary | ICD-10-CM

## 2018-08-03 DIAGNOSIS — M2550 Pain in unspecified joint: Secondary | ICD-10-CM

## 2018-08-03 DIAGNOSIS — B3731 Acute candidiasis of vulva and vagina: Secondary | ICD-10-CM

## 2018-08-03 DIAGNOSIS — M25562 Pain in left knee: Secondary | ICD-10-CM

## 2018-08-03 LAB — CERVICOVAGINAL ANCILLARY ONLY
Bacterial vaginitis: NEGATIVE
Candida vaginitis: POSITIVE — AB
Trichomonas: NEGATIVE

## 2018-08-03 MED ORDER — FLUCONAZOLE 150 MG PO TABS: 150.0000 mg | ORAL_TABLET | Freq: Once | ORAL | 0 refills | Status: AC

## 2018-08-03 MED ORDER — MECLIZINE HCL 25 MG PO TABS: 25.0000 mg | ORAL_TABLET | Freq: Two times a day (BID) | ORAL | 2 refills | Status: DC | PRN

## 2018-08-03 MED ORDER — NAPROXEN SODIUM 550 MG PO TABS: 550.0000 mg | ORAL_TABLET | Freq: Two times a day (BID) | ORAL | 2 refills | Status: DC

## 2018-08-04 LAB — HEPATITIS C ANTIBODY
Hepatitis C Ab: NONREACTIVE
SIGNAL TO CUT-OFF: 0.01 (ref <1.00)

## 2018-08-04 LAB — HSV(HERPES SIMPLEX VRS) I + II AB-IGG: HAV 1 IGG,TYPE SPECIFIC AB: 39.6 index — ABNORMAL HIGH

## 2018-08-04 LAB — TEST AUTHORIZATION

## 2018-08-04 LAB — C. TRACHOMATIS/N. GONORRHOEAE RNA
C. trachomatis RNA, TMA: NOT DETECTED
N. gonorrhoeae RNA, TMA: NOT DETECTED

## 2018-08-04 LAB — RPR (MONITOR) W/REFL: RPR (Monitor) w/refl Titer: NONREACTIVE

## 2018-08-04 LAB — HIV ANTIBODY (ROUTINE TESTING W REFLEX): HIV 1&2 Ab, 4th Generation: NONREACTIVE

## 2018-08-04 LAB — HEPATITIS B SURFACE ANTIGEN: Hepatitis B Surface Ag: NONREACTIVE

## 2018-08-04 LAB — HEPATITIS B SURFACE ANTIBODY, QUANTITATIVE: Hepatitis B-Post: <5 m[IU]/mL — ABNORMAL LOW (ref >=10)

## 2018-08-06 DIAGNOSIS — M7989 Other specified soft tissue disorders: Secondary | ICD-10-CM | POA: Diagnosis not present

## 2018-08-06 DIAGNOSIS — R945 Abnormal results of liver function studies: Secondary | ICD-10-CM | POA: Diagnosis not present

## 2018-08-06 DIAGNOSIS — M329 Systemic lupus erythematosus, unspecified: Secondary | ICD-10-CM | POA: Diagnosis not present

## 2018-08-06 DIAGNOSIS — A689 Relapsing fever, unspecified: Secondary | ICD-10-CM | POA: Diagnosis not present

## 2018-08-06 DIAGNOSIS — M2550 Pain in unspecified joint: Secondary | ICD-10-CM | POA: Diagnosis not present

## 2018-08-06 DIAGNOSIS — R768 Other specified abnormal immunological findings in serum: Secondary | ICD-10-CM | POA: Diagnosis not present

## 2018-08-13 ENCOUNTER — Telehealth: Payer: Self-pay

## 2018-08-13 ENCOUNTER — Encounter: Payer: Self-pay | Admitting: Family Medicine

## 2018-08-13 DIAGNOSIS — Z0279 Encounter for issue of other medical certificate: Secondary | ICD-10-CM

## 2018-08-13 DIAGNOSIS — I639 Cerebral infarction, unspecified: Secondary | ICD-10-CM

## 2018-08-13 HISTORY — DX: Cerebral infarction, unspecified: I63.9

## 2018-08-13 NOTE — Telephone Encounter (Signed)
Naproxen not covered. Preferred:   -Celecoxib -diclofenac -Diflunisal -Etodolac or etodolac ER -Flurbiprofen -IBU or ibuprofen -Ketoprofen -Meloxicam -Nabumetone -Piroxicam -Sulindac  Please advise.

## 2018-08-13 NOTE — Telephone Encounter (Signed)
PA initiated via Covermymeds; KEY: [id number]. Awaiting determination.

## 2018-08-14 ENCOUNTER — Emergency Department (HOSPITAL_COMMUNITY): Payer: Medicare Other

## 2018-08-14 ENCOUNTER — Inpatient Hospital Stay (HOSPITAL_COMMUNITY)
Admission: EM | Admit: 2018-08-14 | Discharge: 2018-08-17 | DRG: 065 | Disposition: A | Discharge status: Home Health | Payer: Medicare Other | Attending: Internal Medicine | Admitting: Internal Medicine

## 2018-08-14 ENCOUNTER — Inpatient Hospital Stay (HOSPITAL_COMMUNITY): Payer: Medicare Other

## 2018-08-14 ENCOUNTER — Encounter (HOSPITAL_COMMUNITY): Payer: Self-pay | Admitting: Emergency Medicine

## 2018-08-14 DIAGNOSIS — Z8261 Family history of arthritis: Secondary | ICD-10-CM

## 2018-08-14 DIAGNOSIS — R7303 Prediabetes: Secondary | ICD-10-CM | POA: Diagnosis not present

## 2018-08-14 DIAGNOSIS — I10 Essential (primary) hypertension: Secondary | ICD-10-CM | POA: Diagnosis present

## 2018-08-14 DIAGNOSIS — R29703 NIHSS score 3: Secondary | ICD-10-CM | POA: Diagnosis present

## 2018-08-14 DIAGNOSIS — M329 Systemic lupus erythematosus, unspecified: Secondary | ICD-10-CM | POA: Diagnosis present

## 2018-08-14 DIAGNOSIS — I639 Cerebral infarction, unspecified: Secondary | ICD-10-CM | POA: Diagnosis present

## 2018-08-14 DIAGNOSIS — I63511 Cerebral infarction due to unspecified occlusion or stenosis of right middle cerebral artery: Secondary | ICD-10-CM | POA: Diagnosis not present

## 2018-08-14 DIAGNOSIS — Z8249 Family history of ischemic heart disease and other diseases of the circulatory system: Secondary | ICD-10-CM

## 2018-08-14 DIAGNOSIS — Z841 Family history of disorders of kidney and ureter: Secondary | ICD-10-CM

## 2018-08-14 DIAGNOSIS — Z79899 Other long term (current) drug therapy: Secondary | ICD-10-CM

## 2018-08-14 DIAGNOSIS — Z87891 Personal history of nicotine dependence: Secondary | ICD-10-CM | POA: Diagnosis not present

## 2018-08-14 DIAGNOSIS — Z823 Family history of stroke: Secondary | ICD-10-CM | POA: Diagnosis not present

## 2018-08-14 DIAGNOSIS — I69354 Hemiplegia and hemiparesis following cerebral infarction affecting left non-dominant side: Secondary | ICD-10-CM | POA: Diagnosis not present

## 2018-08-14 DIAGNOSIS — Z885 Allergy status to narcotic agent status: Secondary | ICD-10-CM

## 2018-08-14 DIAGNOSIS — Z8711 Personal history of peptic ulcer disease: Secondary | ICD-10-CM | POA: Diagnosis not present

## 2018-08-14 DIAGNOSIS — E785 Hyperlipidemia, unspecified: Secondary | ICD-10-CM | POA: Diagnosis not present

## 2018-08-14 DIAGNOSIS — I63512 Cerebral infarction due to unspecified occlusion or stenosis of left middle cerebral artery: Secondary | ICD-10-CM | POA: Diagnosis not present

## 2018-08-14 DIAGNOSIS — G459 Transient cerebral ischemic attack, unspecified: Secondary | ICD-10-CM | POA: Diagnosis not present

## 2018-08-14 DIAGNOSIS — R42 Dizziness and giddiness: Secondary | ICD-10-CM | POA: Diagnosis not present

## 2018-08-14 DIAGNOSIS — R531 Weakness: Secondary | ICD-10-CM | POA: Diagnosis not present

## 2018-08-14 DIAGNOSIS — I63411 Cerebral infarction due to embolism of right middle cerebral artery: Principal | ICD-10-CM | POA: Diagnosis present

## 2018-08-14 DIAGNOSIS — Z7982 Long term (current) use of aspirin: Secondary | ICD-10-CM

## 2018-08-14 DIAGNOSIS — I429 Cardiomyopathy, unspecified: Secondary | ICD-10-CM | POA: Diagnosis present

## 2018-08-14 DIAGNOSIS — Z791 Long term (current) use of non-steroidal anti-inflammatories (NSAID): Secondary | ICD-10-CM

## 2018-08-14 DIAGNOSIS — R29818 Other symptoms and signs involving the nervous system: Secondary | ICD-10-CM | POA: Diagnosis not present

## 2018-08-14 DIAGNOSIS — I361 Nonrheumatic tricuspid (valve) insufficiency: Secondary | ICD-10-CM | POA: Diagnosis not present

## 2018-08-14 LAB — CBG MONITORING, ED
Glucose-Capillary: 77 mg/dL (ref 70–99)
Glucose-Capillary: 86 mg/dL (ref 70–99)

## 2018-08-14 LAB — DIFFERENTIAL
Abs Immature Granulocytes: 0.03 10*3/uL (ref 0.00–0.07)
BASOS PCT: 0 %
Basophils Absolute: 0 10*3/uL (ref 0.0–0.1)
Eosinophils Absolute: 0.1 10*3/uL (ref 0.0–0.5)
Eosinophils Relative: 1 %
Immature Granulocytes: 0 %
Lymphocytes Relative: 16 %
Lymphs Abs: 1.1 10*3/uL (ref 0.7–4.0)
Monocytes Absolute: 0.7 10*3/uL (ref 0.1–1.0)
Monocytes Relative: 10 %
Neutro Abs: 5.2 10*3/uL (ref 1.7–7.7)
Neutrophils Relative %: 73 %

## 2018-08-14 LAB — URINALYSIS, ROUTINE W REFLEX MICROSCOPIC
Bilirubin Urine: NEGATIVE
Glucose, UA: NEGATIVE mg/dL
Hgb urine dipstick: NEGATIVE
Ketones, ur: NEGATIVE mg/dL
Leukocytes, UA: NEGATIVE
NITRITE: NEGATIVE
Protein, ur: NEGATIVE mg/dL
Specific Gravity, Urine: 1.008 (ref 1.005–1.030)
pH: 6 (ref 5.0–8.0)

## 2018-08-14 LAB — COMPREHENSIVE METABOLIC PANEL
ALT: 36 U/L (ref 0–44)
AST: 28 U/L (ref 15–41)
Albumin: 4.7 g/dL (ref 3.5–5.0)
Alkaline Phosphatase: 74 U/L (ref 38–126)
Anion gap: 9 (ref 5–15)
BUN: 13 mg/dL (ref 8–23)
CHLORIDE: 105 mmol/L (ref 98–111)
CO2: 27 mmol/L (ref 22–32)
Calcium: 9 mg/dL (ref 8.9–10.3)
Creatinine, Ser: 1.01 mg/dL — ABNORMAL HIGH (ref 0.44–1.00)
GFR calc Af Amer: >60 mL/min (ref >60)
GFR calc non Af Amer: >60 mL/min (ref >60)
GLUCOSE: 90 mg/dL (ref 70–99)
Potassium: 3.8 mmol/L (ref 3.5–5.1)
SODIUM: 141 mmol/L (ref 135–145)
Total Bilirubin: 0.5 mg/dL (ref 0.3–1.2)
Total Protein: 8.4 g/dL — ABNORMAL HIGH (ref 6.5–8.1)

## 2018-08-14 LAB — APTT: aPTT: 29 seconds (ref 24–36)

## 2018-08-14 LAB — CBC
HCT: 45.1 % (ref 36.0–46.0)
HEMOGLOBIN: 14.1 g/dL (ref 12.0–15.0)
MCH: 27.1 pg (ref 26.0–34.0)
MCHC: 31.3 g/dL (ref 30.0–36.0)
MCV: 86.6 fL (ref 80.0–100.0)
NRBC: 0 % (ref 0.0–0.2)
Platelets: 270 10*3/uL (ref 150–400)
RBC: 5.21 MIL/uL — ABNORMAL HIGH (ref 3.87–5.11)
RDW: 13.5 % (ref 11.5–15.5)
WBC: 7.1 10*3/uL (ref 4.0–10.5)

## 2018-08-14 LAB — TROPONIN I: Troponin I: <0.03 ng/mL (ref <0.03)

## 2018-08-14 LAB — PROTIME-INR
INR: 0.98
Prothrombin Time: 12.9 seconds (ref 11.4–15.2)

## 2018-08-14 MED ORDER — ACETAMINOPHEN 325 MG PO TABS
650.0000 mg | ORAL_TABLET | ORAL | Status: DC | PRN
  Administered 2018-08-15 – 2018-08-17 (×4): 650 mg via ORAL
  Filled 2018-08-14 (×4): qty 2

## 2018-08-14 MED ORDER — MECLIZINE HCL 12.5 MG PO TABS: 25.0000 mg | ORAL_TABLET | Freq: Two times a day (BID) | ORAL | Status: DC | PRN

## 2018-08-14 MED ORDER — ASPIRIN 325 MG PO TABS: 325.0000 mg | ORAL_TABLET | Freq: Every day | ORAL | Status: DC

## 2018-08-14 MED ORDER — HYDROXYCHLOROQUINE SULFATE 200 MG PO TABS
200.0000 mg | ORAL_TABLET | Freq: Two times a day (BID) | ORAL | Status: DC
  Administered 2018-08-14 – 2018-08-17 (×6): 200 mg via ORAL
  Filled 2018-08-14 (×6): qty 1

## 2018-08-14 MED ORDER — ASPIRIN 325 MG PO TABS
325.0000 mg | ORAL_TABLET | Freq: Every day | ORAL | Status: DC
  Administered 2018-08-15 – 2018-08-17 (×3): 325 mg via ORAL
  Filled 2018-08-14 (×3): qty 1

## 2018-08-14 MED ORDER — ACETAMINOPHEN 160 MG/5ML PO SOLN: 650.0000 mg | ORAL | Status: DC | PRN

## 2018-08-14 MED ORDER — ASPIRIN 300 MG RE SUPP: 300.0000 mg | Freq: Every day | RECTAL | Status: DC

## 2018-08-14 MED ORDER — SODIUM CHLORIDE 0.9% FLUSH
3.0000 mL | Freq: Once | INTRAVENOUS | Status: DC
Start: 2018-08-14 — End: 2018-08-17

## 2018-08-14 MED ORDER — NAPROXEN SODIUM 275 MG PO TABS
550.0000 mg | ORAL_TABLET | Freq: Two times a day (BID) | ORAL | Status: DC
  Administered 2018-08-15: 550 mg via ORAL
  Filled 2018-08-14 (×2): qty 2

## 2018-08-14 MED ORDER — DIPHENHYDRAMINE HCL 25 MG PO CAPS
25.0000 mg | ORAL_CAPSULE | Freq: Once | ORAL | Status: AC
  Administered 2018-08-14: 25 mg via ORAL
  Filled 2018-08-14: qty 1

## 2018-08-14 MED ORDER — ATORVASTATIN CALCIUM 10 MG PO TABS: 10.0000 mg | ORAL_TABLET | Freq: Every day | ORAL | Status: DC

## 2018-08-14 MED ORDER — ACETAMINOPHEN 650 MG RE SUPP: 650.0000 mg | RECTAL | Status: DC | PRN

## 2018-08-14 MED ORDER — LORAZEPAM 2 MG/ML IJ SOLN
2.0000 mg | Freq: Once | INTRAMUSCULAR | Status: AC
  Administered 2018-08-14: 2 mg via INTRAVENOUS
  Filled 2018-08-14: qty 1

## 2018-08-14 MED ORDER — STROKE: EARLY STAGES OF RECOVERY BOOK
Freq: Once | Status: AC
  Administered 2018-08-14: 23:00:00
  Filled 2018-08-14: qty 1

## 2018-08-14 MED ORDER — ENOXAPARIN SODIUM 40 MG/0.4ML ~~LOC~~ SOLN
40.0000 mg | SUBCUTANEOUS | Status: DC
  Administered 2018-08-14 – 2018-08-16 (×3): 40 mg via SUBCUTANEOUS
  Filled 2018-08-14 (×3): qty 0.4

## 2018-08-14 MED ORDER — PROCHLORPERAZINE EDISYLATE 10 MG/2ML IJ SOLN
10.0000 mg | Freq: Once | INTRAMUSCULAR | Status: AC
  Administered 2018-08-14: 10 mg via INTRAVENOUS
  Filled 2018-08-14: qty 2

## 2018-08-14 NOTE — ED Notes (Signed)
Patient transported to MRI 

## 2018-08-14 NOTE — ED Notes (Signed)
Patient states she wants to leave rather than wait for the MRI to be done here - wants to know if her PCP may do it. MD aware and at bedside speaking with patient and her daughter.

## 2018-08-14 NOTE — ED Notes (Signed)
Patient ambulated with steady gait from bed to restroom and back.

## 2018-08-14 NOTE — ED Notes (Signed)
Patient in route to [facility] ED.

## 2018-08-14 NOTE — Consult Note (Signed)
Neurology Consultation  Reason for Consult: Code Stroke Referring Physician: Dr. Ronnald Nian   CC: [place] of left sided weakness   History is obtained from: Chart review, EMS, patient and staff   HPI: Amanda Saunders is a 63 y.o. female with a past medical history of hypertension, prediabetes, stroke with left side residual weakness, cardiomyopathy, angina, remote smoker presents today to [facility] with a chief complaint of dizziness, left arm tingling and arm heaviness. Symptoms apparently intensified prompting a code stroke to be initiated and patient to be transferred to [facility] for further evaluation.  Apparently she had stated that her last known well was at 6:00 this morning to EMS but arrives to [facility] with the explanation that her LKW was actually 1130 last night and that she did awake with symptoms.  She states that along with her left-sided weakness, she also had experience of a headache and a left arm tremor.  She denies any history of seizure.  Prior to her arriving she did complete a CT brain without IV contrast that revealed no acute finding, with chronic small-vessel ischemic changes of the cerebral hemispheric white matter noted.  When she arrived an NIHSS of 3 was given for left arm, leg drift and left arm dysmetria, at least a component of which comprises her baseline residual stroke symptoms.  She admits to headache 8/10, denies migraine history. She also admits to N/V this morning after drinking coffee. She denies trouble swallowing.   LKW: last night 1130 pm  08/13/18 tpa given?: no, outside of window, exam highly functional; fluctuating story and exam Premorbid modified Rankin scale (mRS): 2    ROS: A 14 point ROS was performed and is negative except as noted in the HPI.   Past Medical History:  Diagnosis Date  . Cardiomyopathy ([place])   . Hypertension   . PUD (peptic ulcer disease)   . Stroke ([place])   . Vertigo    mRS: 2-Slight disability-UNABLE to perform all  activities but does not need assistance     Family History  Problem Relation Age of Onset  . Hypertension Mother   . Arthritis Mother   . Heart failure Mother   . Stroke Mother   . Kidney disease Father   . Hypertension Father     Social History:   reports that she quit smoking about 16 years ago. She has never used smokeless tobacco. She reports current alcohol use. She reports that she does not use drugs.  Medications  Current Facility-Administered Medications:  .  sodium chloride flush (NS) 0.9 % injection 3 mL, 3 mL, Intravenous, Once, Duffy Bruce, MD, Stopped at 08/14/18 1323  Current Outpatient Medications:  .  aspirin 325 MG tablet, Take 650 mg by mouth daily., Disp: , Rfl:  .  aspirin 81 MG tablet, Take 81 mg by mouth daily., Disp: , Rfl:  .  atorvastatin (LIPITOR) 10 MG tablet, Take 1 tablet (10 mg total) by mouth daily., Disp: 90 tablet, Rfl: 3 .  hydrochlorothiazide (HYDRODIURIL) 25 MG tablet, Take 1 tablet (25 mg total) by mouth every other day., Disp: 45 tablet, Rfl: 3 .  hydroxychloroquine (PLAQUENIL) 200 MG tablet, Take 200 mg by mouth 2 (two) times daily., Disp: , Rfl:  .  meclizine (ANTIVERT) 25 MG tablet, Take 1 tablet (25 mg total) by mouth 2 (two) times daily as needed for dizziness., Disp: 30 tablet, Rfl: 2 .  metoprolol succinate (TOPROL-XL) 25 MG 24 hr tablet, Take 1 tablet (25 mg total) by  mouth daily., Disp: 90 tablet, Rfl: 3 .  naproxen sodium (ANAPROX) 550 MG tablet, Take 1 tablet (550 mg total) by mouth 2 (two) times daily with a meal. Use as needed for pain, Disp: 30 tablet, Rfl: 2   Exam: Current vital signs: BP (!) 161/81   Pulse 62   Temp 98.6 F (37 C) (Oral)   Resp 19   SpO2 98%  Vital signs in last 24 hours: Temp:  [98.6 F (37 C)] 98.6 F (37 C) (01/25 1305) Pulse Rate:  [59-65] 62 (01/25 1430) Resp:  [18-20] 19 (01/25 1430) BP: (133-161)/(81-85) 161/81 (01/25 1430) SpO2:  [96 %-99 %] 98 % (01/25 1430)  GENERAL: Awake, alert in  NAD HEENT: - Normocephalic and atraumatic, dry mm, no LN++, no Thyromegally LUNGS - Clear to auscultation bilaterally with no wheezes CV - S1S2 RRR, no m/r/g, equal pulses bilaterally. ABDOMEN - Soft, nontender, nondistended with normoactive BS Ext: warm, well perfused, intact peripheral pulses, no edema  NEURO:  Mental Status: AA&Ox3. Speech fluent without paraphasias. Comprehension intact.  Naming and repetition intact. Cranial Nerves: PERRL 3 mm/brisk. EOMI, visual fields full, no facial asymmetry, facial sensation intact, hearing intact, tongue/uvula/soft palate midline, Head midline. No lingual dysarthria. Motor: RUE 5/5, LLE 5/5, LUE 4/5 (drift 1), RLE 4/5 (drift 1) Tone and bulk are normal Reflexes 1+ BUE with patellar reflexes difficult to elicit. 1+ achilles bilaterally.    Sensation- Intact to light touch bilaterally Coordination: Equivocal dysmetria with FNF LUE Gait- deferred  NIHSS 3   Labs I have reviewed labs in epic and the results pertinent to this consultation are: LDL-C 148 (2017), Chol 223 (2017)- pending redraw and completion   CBC    Component Value Date/Time   WBC 7.1 08/14/2018 1323   RBC 5.21 (H) 08/14/2018 1323   HGB 14.1 08/14/2018 1323   HCT 45.1 08/14/2018 1323   PLT 270 08/14/2018 1323   MCV 86.6 08/14/2018 1323   MCH 27.1 08/14/2018 1323   MCHC 31.3 08/14/2018 1323   RDW 13.5 08/14/2018 1323   LYMPHSABS 1.1 08/14/2018 1323   MONOABS 0.7 08/14/2018 1323   EOSABS 0.1 08/14/2018 1323   BASOSABS 0.0 08/14/2018 1323    CMP     Component Value Date/Time   NA 141 08/14/2018 1323   K 3.8 08/14/2018 1323   CL 105 08/14/2018 1323   CO2 27 08/14/2018 1323   GLUCOSE 90 08/14/2018 1323   BUN 13 08/14/2018 1323   CREATININE 1.01 (H) 08/14/2018 1323   CALCIUM 9.0 08/14/2018 1323   PROT 8.4 (H) 08/14/2018 1323   ALBUMIN 4.7 08/14/2018 1323   AST 28 08/14/2018 1323   ALT 36 08/14/2018 1323   ALKPHOS 74 08/14/2018 1323   BILITOT 0.5 08/14/2018  1323   GFRNONAA >60 08/14/2018 1323   GFRAA >60 08/14/2018 1323    Lipid Panel     Component Value Date/Time   CHOL 223 (H) 05/14/2016 1115   TRIG 123.0 05/14/2016 1115   HDL 50.90 05/14/2016 1115   CHOLHDL 4 05/14/2016 1115   VLDL 24.6 05/14/2016 1115   LDLCALC 148 (H) 05/14/2016 1115     Imaging I have reviewed the images obtained:  CT-scan of the brain 1. No acute finding by CT. Chronic small-vessel ischemic changes of the cerebral hemispheric white matter. 2. ASPECTS is 10.  MRI examination of the brain - pending   Assessment:  63 year old female with a past medical history of hypertension, prediabetes, stroke with left sided residual  weakness, cardiomyopathy, angina and remote smoker, who presents today to [facility] ED with a chief complaint of dizziness, left arm tingling and LUE heaviness. Symptoms apparently intensified prompting a code stroke to be initiated and patient to be transferred to [facility] for further evaluation. 1. CT head was negative at [facility] 2. Exam here by staff initially with left sided weakness. Patient appeared to have some functionality. After risks/benefits of endovascular treatment were described to her, in addition to encouragement to give maximum effort on motor exam, she manifested full 5/5 motor strength in LUE and LLE without asymmetry relative to the right. No facial droop, aphasia, dysarthria, tongue deviation, oculomotor abnormality, visual field cut or other findings consistent with stroke were present on repeat exam. Equivocal dysmetria with left FNF was noted. DDx includes factitious disorder and TIA.  3. Of note, the patient does have a history of stroke with several risk factors which increases her risk for stroke recurrence. Her exam did appear highly functional at times and her history fluctuated as well.  She has blood pressure elevation with SBP 160's and this could contribute to her HA. Also on DDx is malignant HTN with vasospasm  precipitating stroke symptoms.  4. She endorses a baseline of residual left sided weakness.   Recommendations: # MRI of the brain without contrast # MRA head and neck  #Transthoracic Echo # Continue ASA 81 mg po qd. May change management pending results of TIA/Stroke work up # Increase atorvastatin to 40 mg po qd. Obtain baseline CK level # BP goal: Modified permissive HTN protocol given possible malignant HTN on the DDx, in addition to TIA. Treat SBP if > 160.   # HBAIC and Lipid profile # Telemetry monitoring # Frequent neuro checks # NPO until passes stroke swallow screen # please page stroke NP  Or  PA  Or MD from 8am -4 pm  as this patient from this time will be  followed by the stroke.   You can look them up on [email or web address]  Password [email or web address]   I have interviewed and examined the patient. I have amended the documented exam with pertinent additional findings. I have formulated the assessment and recommendations. Electronically signed: Dr. Kerney Elbe

## 2018-08-14 NOTE — ED Provider Notes (Signed)
Patient was transferred to [facility] as a code stroke.  Neurology at the bedside upon arrival.  Patient is 59 F, here with left-sided weakness.  Last normal was possibly around last night at 10 to 11:00 PM.  When the patient woke up this morning at 6 AM she felt numb on the left side of her body.  Unknown if she was weak on the left side.  She had some vision changes as well.  Patient had [facility] at [facility] that was unremarkable.  She appears neurologically intact upon arrival here.  She appears to have some functional weakness on the right side of her face and left side of her body.  However when coached it appears that she has normal strength and sensation and normal exam throughout.  Lab work has already been completed that was unremarkable.  No significant findings.  No significant anemia, electrolyte abnormality, kidney injury.  Patient was given headache cocktail.  Neurology recommends MRI.  If MRI is normal patient is to be discharged home.  Patient was signed out to oncoming ED staff with patient pending MRI.  Symptoms have fully improved following headache cocktail.  Possible functional symptoms today/complex migraine. Please see oncoming ED provider's note for further results, evaluation, disposition of the patient.  This chart was dictated using voice recognition software.  Despite best efforts to proofread,  errors can occur which can change the documentation meaning.    Lennice Sites, DO 08/14/18 1641

## 2018-08-14 NOTE — ED Provider Notes (Signed)
[facility] DEPT Provider Note   CSN: [id number] Arrival date & time: 08/14/18  1300     History   Chief Complaint Chief Complaint  Patient presents with  . Code Stroke  . Tingling  . Numbness  . Difficulty Walking  . Dizziness    HPI Amanda Saunders is a 63 y.o. female.  HPI   63 year old female with history of stroke, hypertension, cardiomyopathy, here with left-sided weakness.  Patient was last normal last night upon going to bed.  She states she woke up around 6 AM, felt numb in her left side, but went back to sleep.  Since then, she has had persistent, severe weakness.  She tried to get out of bed and was able to do that. She last actually felt normal, without any numbness or weakness, around 10-11 PM last night upon going to bed.  She states that today, she feels like she has decreased vision, weakness, and has had some mild confusion.  No other complaints.  History of vertigo.  Denies any history of unilateral weakness.  Level 5 caveat invoked as remainder of history, ROS, and physical exam limited due to patient's acuity.   Past Medical History:  Diagnosis Date  . Cardiomyopathy ([place])   . Hypertension   . PUD (peptic ulcer disease)   . Stroke ([place])   . Vertigo     Patient Active Problem List   Diagnosis Date Noted  . Pre-diabetes 04/03/2016  . History of CVA (cerebrovascular accident) 03/12/2016  . Chest pain at rest 03/12/2016  . Peripheral edema 03/12/2016    Past Surgical History:  Procedure Laterality Date  . BACK SURGERY    . CARPAL TUNNEL RELEASE    . NECK SURGERY     2015     OB History   No obstetric history on file.      Home Medications    Prior to Admission medications   Medication Sig Start Date End Date Taking? Authorizing Provider  aspirin 81 MG tablet Take 81 mg by mouth daily.    [provider]  atorvastatin (LIPITOR) 10 MG tablet Take 1 tablet (10 mg total) by mouth daily. 01/18/18    Copland, Gay Filler, MD  hydrochlorothiazide (HYDRODIURIL) 25 MG tablet Take 1 tablet (25 mg total) by mouth every other day. 03/01/18   Copland, Gay Filler, MD  hydroxychloroquine (PLAQUENIL) 200 MG tablet Take 200 mg by mouth 2 (two) times daily. 05/21/18   [provider]  meclizine (ANTIVERT) 25 MG tablet Take 1 tablet (25 mg total) by mouth 2 (two) times daily as needed for dizziness. 08/03/18   Copland, Gay Filler, MD  metoprolol succinate (TOPROL-XL) 25 MG 24 hr tablet Take 1 tablet (25 mg total) by mouth daily. 01/18/18   Copland, Gay Filler, MD  naproxen sodium (ANAPROX) 550 MG tablet Take 1 tablet (550 mg total) by mouth 2 (two) times daily with a meal. Use as needed for pain 08/03/18   Copland, Gay Filler, MD    Family History Family History  Problem Relation Age of Onset  . Hypertension Mother   . Arthritis Mother   . Heart failure Mother   . Stroke Mother   . Kidney disease Father   . Hypertension Father     Social History Social History   Tobacco Use  . Smoking status: Former Smoker    Last attempt to quit: 07/21/2002    Years since quitting: 16.0  . Smokeless tobacco: Never Used  Substance  Use Topics  . Alcohol use: Yes    Comment: occasional  . Drug use: No     Allergies   Codeine   Review of Systems Review of Systems  Constitutional: Negative for chills, fatigue and fever.  HENT: Negative for congestion and rhinorrhea.   Eyes: Positive for visual disturbance.  Respiratory: Negative for cough, shortness of breath and wheezing.   Cardiovascular: Negative for chest pain and leg swelling.  Gastrointestinal: Negative for abdominal pain, diarrhea, nausea and vomiting.  Genitourinary: Negative for dysuria and flank pain.  Musculoskeletal: Negative for neck pain and neck stiffness.  Skin: Negative for rash and wound.  Allergic/Immunologic: Negative for immunocompromised state.  Neurological: Positive for weakness and numbness. Negative for syncope and headaches.   All other systems reviewed and are negative.    Physical Exam Updated Vital Signs BP (!) 148/85 (BP Location: Left Arm)   Pulse 65   Temp 98.6 F (37 C) (Oral)   Resp 18   SpO2 96%   Physical Exam Vitals signs and nursing note reviewed.  Constitutional:      General: She is not in acute distress.    Appearance: She is well-developed.  HENT:     Head: Normocephalic and atraumatic.  Eyes:     Conjunctiva/sclera: Conjunctivae normal.  Neck:     Musculoskeletal: Neck supple.  Cardiovascular:     Rate and Rhythm: Normal rate and regular rhythm.     Heart sounds: Normal heart sounds. No murmur. No friction rub.  Pulmonary:     Effort: Pulmonary effort is normal. No respiratory distress.     Breath sounds: Normal breath sounds. No wheezing or rales.  Abdominal:     General: There is no distension.     Palpations: Abdomen is soft.     Tenderness: There is no abdominal tenderness.  Skin:    General: Skin is warm.     Capillary Refill: Capillary refill takes less than 2 seconds.     Findings: No rash.  Neurological:     Mental Status: She is alert and oriented to person, place, and time.     Motor: No abnormal muscle tone.     Comments: Subtle right nasolabial fold flattening.  Tongue protrusion appears midline.  Left hemiparesis noted, with 4 out of 5 strength on left upper and lower extremities, and positive pronator drift.  Endorses diminished sensation throughout left hemibody.  Speech normal.  Left visual fields impaired bilaterally.      ED Treatments / Results  Labs (all labs ordered are listed, but only abnormal results are displayed) Labs Reviewed  PROTIME-INR  APTT  CBC  DIFFERENTIAL  COMPREHENSIVE METABOLIC PANEL  TROPONIN I  CBG MONITORING, ED  I-STAT TROPONIN, ED    EKG None  Radiology No results found.  Procedures .Critical Care Performed by: Duffy Bruce, MD Authorized by: Duffy Bruce, MD   Critical care provider statement:     Critical care time (minutes):  35   Critical care time was exclusive of:  Separately billable procedures and treating other patients and teaching time   Critical care was necessary to treat or prevent imminent or life-threatening deterioration of the following conditions:  CNS failure or compromise   Critical care was time spent personally by me on the following activities:  Development of treatment plan with patient or surrogate, discussions with consultants, evaluation of patient's response to treatment, examination of patient, obtaining history from patient or surrogate, ordering and performing treatments and interventions, ordering and review  of laboratory studies, ordering and review of radiographic studies, pulse oximetry, re-evaluation of patient's condition and review of old charts   I assumed direction of critical care for this patient from another provider in my specialty: no     (including critical care time)  Medications Ordered in ED Medications  sodium chloride flush (NS) 0.9 % injection 3 mL (0 mLs Intravenous Hold 08/14/18 1323)     Initial Impression / Assessment and Plan / ED Course  I have reviewed the triage vital signs and the nursing notes.  Pertinent labs & imaging results that were available during my care of the patient were reviewed by me and considered in my medical decision making (see chart for details).     63 yo F here w/ left-sided weakness. Last normal was actually last night around 10-11 PM when she went to bed. She awoke @ 6 AM but felt "numb in her left body," unsure if she was weak as she did not get up, then tried to wake up after falling back asleep and noticed weakness. Airway intact. CODE STROKE activated. Case discussed with Dr. Cheral Marker - will transfer to [facility] emergently. Elim paged. Large-bore IV placed while awaiting transport, for CTA.  Final Clinical Impressions(s) / ED Diagnoses   Final diagnoses:  Left-sided weakness    ED Discharge  Orders    None       Duffy Bruce, MD 08/14/18 1335

## 2018-08-14 NOTE — ED Notes (Signed)
Patient has loss of feeling/numbness in LEFT SIDE of body.

## 2018-08-14 NOTE — ED Notes (Signed)
RN called Rennie Natter, Agricultural consultant at [employer] ED and gave report on Code Stroke in route to [facility].

## 2018-08-14 NOTE — ED Notes (Signed)
Code Stroke called by Santiago Glad to Mining engineer.

## 2018-08-14 NOTE — ED Notes (Signed)
Patient arrives via [facility] from [facility] for further evaluation of stroke symptoms. Assessment of patient by this RN and Neuro at bedside indicates improvement of patient's symptoms - patient c/o headache and L sided weakness and tingling (L hand). She had a prior CVA in 2015 with L sided residual weakness. Drift noted in L arm and L leg and ataxia of L arm noted. She is A&O x 4. States that she was normal when she went to bed at 2330 last night and upon waking at 0600 this morning, she "felt out of sorts."

## 2018-08-14 NOTE — ED Notes (Signed)
Report attempted however RN was in a contact room.

## 2018-08-14 NOTE — H&P (Signed)
History and Physical    Amanda GUINTHER Saunders:[id number] DOB: 1956/05/26 DOA: 08/14/2018  PCP: Amanda Mclean, MD  Patient coming from: Home  I have personally briefly reviewed patient's old medical records in [place]  Chief Complaint: L sided weakness  HPI: Amanda Saunders is a 63 y.o. female with medical history significant of HTN, prior stroke with residual L sided weakness.  Patient presents to the ED with c/o dizziness, L arm tingling and heaviness.  LKW 1130 last evening, woke this morning with symptoms.   ED Course: Seen in ED at [facility] by neurology.  Underwent MRI brain which does show multiple small acute embolic strokes in the R MCA distribution.  Got ASA    Review of Systems: As per HPI otherwise 10 point review of systems negative.   Past Medical History:  Diagnosis Date  . Cardiomyopathy (Amanda Saunders)   . Hypertension   . PUD (peptic ulcer disease)   . Stroke (Amanda Saunders)   . Vertigo     Past Surgical History:  Procedure Laterality Date  . BACK SURGERY    . CARPAL TUNNEL RELEASE    . NECK SURGERY     2015     reports that she quit smoking about 16 years ago. She has never used smokeless tobacco. She reports current alcohol use. She reports that she does not use drugs.  Allergies  Allergen Reactions  . Codeine Nausea And Vomiting    Family History  Problem Relation Age of Onset  . Hypertension Mother   . Arthritis Mother   . Heart failure Mother   . Stroke Mother   . Kidney disease Father   . Hypertension Father      Prior to Admission medications   Medication Sig Start Date End Date Taking? Authorizing Provider  aspirin 325 MG tablet Take 650 mg by mouth daily.   Yes [provider]  aspirin 81 MG tablet Take 81 mg by mouth daily.   Yes [provider]  atorvastatin (LIPITOR) 10 MG tablet Take 1 tablet (10 mg total) by mouth daily. 01/18/18  Yes Copland, Amanda Filler, MD  hydrochlorothiazide (HYDRODIURIL) 25 MG tablet Take 1 tablet  (25 mg total) by mouth every other day. 03/01/18  Yes Copland, Amanda Filler, MD  hydroxychloroquine (PLAQUENIL) 200 MG tablet Take 200 mg by mouth 2 (two) times daily. 05/21/18  Yes [provider]  meclizine (ANTIVERT) 25 MG tablet Take 1 tablet (25 mg total) by mouth 2 (two) times daily as needed for dizziness. 08/03/18  Yes Copland, Amanda Filler, MD  metoprolol succinate (TOPROL-XL) 25 MG 24 hr tablet Take 1 tablet (25 mg total) by mouth daily. 01/18/18  Yes Copland, Amanda Filler, MD  naproxen sodium (ANAPROX) 550 MG tablet Take 1 tablet (550 mg total) by mouth 2 (two) times daily with a meal. Use as needed for pain 08/03/18  Yes Copland, Amanda Filler, MD    Physical Exam: Vitals:   08/14/18 1400 08/14/18 1430 08/14/18 1500 08/14/18 1600  BP: 133/82 (!) 161/81 133/71 137/64  Pulse: (!) 59 62 61 64  Resp: 20 19 16 15   Temp:      TempSrc:      SpO2: 99% 98% 98% 99%    Constitutional: NAD, calm, comfortable Eyes: PERRL, lids and conjunctivae normal ENMT: Mucous membranes are moist. Posterior pharynx clear of any exudate or lesions.Normal dentition.  Neck: normal, supple, no masses, no thyromegaly Respiratory: clear to auscultation bilaterally, no wheezing, no crackles. Normal respiratory effort.  No accessory muscle use.  Cardiovascular: Regular rate and rhythm, no murmurs / rubs / gallops. No extremity edema. 2+ pedal pulses. No carotid bruits.  Abdomen: no tenderness, no masses palpated. No hepatosplenomegaly. Bowel sounds positive.  Musculoskeletal: no clubbing / cyanosis. No joint deformity upper and lower extremities. Good ROM, no contractures. Normal muscle tone.  Skin: no rashes, lesions, ulcers. No induration Neurologic: 4/5 strength on L, 5/5 on R.  Change in sensation on L per patient.  Speech normal. Psychiatric: Normal judgment and insight. Alert and oriented x 3. Normal mood.    Labs on Admission: I have personally reviewed following labs and imaging studies  CBC: Recent Labs    Lab 08-29-2018 1323  WBC 7.1  NEUTROABS 5.2  HGB 14.1  HCT 45.1  MCV 86.6  PLT 941   Basic Metabolic Panel: Recent Labs  Lab Aug 29, 2018 1323  NA 141  K 3.8  CL 105  CO2 27  GLUCOSE 90  BUN 13  CREATININE 1.01*  CALCIUM 9.0   GFR: Estimated Creatinine Clearance: 62.2 mL/min (A) (by C-G formula based on SCr of 1.01 mg/dL (H)). Liver Function Tests: Recent Labs  Lab 2018-08-29 1323  AST 28  ALT 36  ALKPHOS 74  BILITOT 0.5  PROT 8.4*  ALBUMIN 4.7   No results for input(s): LIPASE, AMYLASE in the last 168 hours. No results for input(s): AMMONIA in the last 168 hours. Coagulation Profile: Recent Labs  Lab Aug 29, 2018 1323  INR 0.98   Cardiac Enzymes: Recent Labs  Lab 08/29/2018 1323  TROPONINI <0.03   BNP (last 3 results) No results for input(s): PROBNP in the last 8760 hours. HbA1C: No results for input(s): HGBA1C in the last 72 hours. CBG: Recent Labs  Lab 2018/08/29 1330 08/29/18 1351  GLUCAP 86 77   Lipid Profile: No results for input(s): CHOL, HDL, LDLCALC, TRIG, CHOLHDL, LDLDIRECT in the last 72 hours. Thyroid Function Tests: No results for input(s): TSH, T4TOTAL, FREET4, T3FREE, THYROIDAB in the last 72 hours. Anemia Panel: No results for input(s): VITAMINB12, FOLATE, FERRITIN, TIBC, IRON, RETICCTPCT in the last 72 hours. Urine analysis:    Component Value Date/Time   COLORURINE STRAW (A) 2018-08-29 1555   APPEARANCEUR CLEAR 2018/08/29 1555   LABSPEC 1.008 29-Aug-2018 1555   PHURINE 6.0 08/29/2018 1555   GLUCOSEU NEGATIVE 08-29-2018 1555   HGBUR NEGATIVE 29-Aug-2018 Amanda Saunders 29-Aug-2018 1555   BILIRUBINUR negative 01/18/2018 0940   KETONESUR NEGATIVE 2018/08/29 1555   PROTEINUR NEGATIVE 2018/08/29 1555   UROBILINOGEN 0.2 01/18/2018 0940   NITRITE NEGATIVE August 29, 2018 1555   LEUKOCYTESUR NEGATIVE 2018-08-29 1555    Radiological Exams on Admission: Mr Brain Wo Contrast (neuro Protocol)  Result Date: 08/29/18 CLINICAL DATA:   Left upper and lower extremity weakness and visual disturbance. Follow-up stroke. EXAM: MRI HEAD WITHOUT CONTRAST TECHNIQUE: Multiplanar, multiecho pulse sequences of the brain and surrounding structures were obtained without intravenous contrast. COMPARISON:  Head CT earlier same day.  MRI 05/28/2016. FINDINGS: Brain: Scattered foci of acute infarction in the right frontal cortical and subcortical brain consistent with micro embolic infarctions in the right middle cerebral artery territory. No large or confluent infarction. No hemorrhage or mass effect. Elsewhere, brainstem and cerebellum appear normal. Cerebral hemispheres show advanced chronic small-vessel ischemic changes affecting the deep and subcortical white matter. No large vessel territory infarction. No mass lesion, hemorrhage, hydrocephalus or extra-axial collection. Vascular: Major vessels at the base of the brain show flow. Skull and upper cervical spine: Negative Sinuses/Orbits: Clear/normal  Other: None IMPRESSION: Scattered acute subcortical and small cortical infarctions in the right frontal lobe most consistent with micro embolic infarctions in a right MCA branch vessel distribution or branch vessel occlusion. No large confluent infarction. No swelling, mass effect or hemorrhage. Extensive chronic small-vessel ischemic changes elsewhere throughout the cerebral hemispheric white matter. Electronically Signed   By: Nelson Chimes M.D.   On: 08/14/2018 19:43   Ct Head Code Stroke Wo Contrast  Result Date: 08/14/2018 CLINICAL DATA:  Code stroke. Left upper and lower extremity weakness. Left-sided visual disturbance. EXAM: CT HEAD WITHOUT CONTRAST TECHNIQUE: Contiguous axial images were obtained from the base of the skull through the vertex without intravenous contrast. COMPARISON:  CT and MRI studies 2017. FINDINGS: Brain: The brainstem and cerebellum appear normal. Cerebral hemispheres show chronic appearing low-density throughout the hemispheric  white matter consistent with chronic small vessel ischemic change. No CT finding of acute stroke affecting the right hemisphere. No mass lesion, hemorrhage, hydrocephalus or extra-axial collection. Vascular: There is atherosclerotic calcification of the major vessels at the base of the brain. No sign of acute hyperdense vessel. Skull: Negative Sinuses/Orbits: Clear/normal Other: None ASPECTS ([place] Stroke Program Early CT Score) - Ganglionic level infarction (caudate, lentiform nuclei, internal capsule, insula, M1-M3 cortex): 7 - Supraganglionic infarction (M4-M6 cortex): 3 Total score (0-10 with 10 being normal): 10 IMPRESSION: 1. No acute finding by CT. Chronic small-vessel ischemic changes of the cerebral hemispheric white matter. 2. ASPECTS is 10. * These results were called by telephone at the time of interpretation on 08/14/2018 at 1:36 pm to Dr. Duffy Bruce , who verbally acknowledged these results. Electronically Signed   By: Nelson Chimes M.D.   On: 08/14/2018 13:40    EKG: Independently reviewed.  Assessment/Plan Principal Problem:   Acute ischemic stroke (HCC) Active Problems:   HTN (hypertension)    1. Acute ischemic stroke - 1. Stroke pathway 2. Neuro consult 3. MRA 4. 2d echo 5. FLP, A1C 6. Continue statin 7. Increasing ASA to 325 daily 8. Tele monitor 9. PT/OT/SLP 2. HTN - 1. Hold home BP meds and allow permissive HTN  DVT prophylaxis: Lovenox Code Status: Full Family Communication: Family at bedside Disposition Plan: Home after admit Consults called: Neuro Admission status: Admit to inpatient  Severity of Illness: The appropriate patient status for this patient is INPATIENT. Inpatient status is judged to be reasonable and necessary in order to provide the required intensity of service to ensure the patient's safety. The patient's presenting symptoms, physical exam findings, and initial radiographic and laboratory data in the context of their chronic comorbidities  is felt to place them at high risk for further clinical deterioration. Furthermore, it is not anticipated that the patient will be medically stable for discharge from the hospital within 2 midnights of admission. The following factors support the patient status of inpatient.   " The patient's presenting symptoms include L sided weakness. " The worrisome physical exam findings include L sided weakness. " The initial radiographic and laboratory data are worrisome because of Acute ischemic strokes on MRI. " The chronic co-morbidities include HTN, prior strokes.   * I certify that at the point of admission it is my clinical judgment that the patient will require inpatient hospital care spanning beyond 2 midnights from the point of admission due to high intensity of service, high risk for further deterioration and high frequency of surveillance required.Etta Quill DO [facility] Pager [phone] Only works nights!  If 7AM-7PM, please  contact the primary day team physician taking care of patient  [email or web address] Password [facility]  08/14/2018, 9:36 PM

## 2018-08-14 NOTE — ED Triage Notes (Signed)
Patient here from home with complaints of dizziness and left arm tingling. States "my left arm arm is heavy, I can't lift it". Patient states that she feels "out of sort". Able to answer all questions. Grips weaker on the left side. Ambulatory with assistance.

## 2018-08-14 NOTE — ED Notes (Signed)
Patient transported to CT 

## 2018-08-14 NOTE — ED Provider Notes (Signed)
Signed out by Dr Ronnald Nian at 1700 to check mri when resulted, if pos admit to medicine. If neg d/c to home.  MRI is c/w acute cva. Medicine/admitting team consulted for admission.  Discussed results w pt.      Lajean Saver, MD 08/14/18 2034

## 2018-08-15 ENCOUNTER — Other Ambulatory Visit: Payer: Self-pay

## 2018-08-15 ENCOUNTER — Inpatient Hospital Stay (HOSPITAL_COMMUNITY): Payer: Medicare Other

## 2018-08-15 ENCOUNTER — Other Ambulatory Visit (HOSPITAL_COMMUNITY): Payer: Medicare Other

## 2018-08-15 ENCOUNTER — Encounter (HOSPITAL_COMMUNITY): Payer: Medicare Other

## 2018-08-15 DIAGNOSIS — I361 Nonrheumatic tricuspid (valve) insufficiency: Secondary | ICD-10-CM

## 2018-08-15 DIAGNOSIS — I639 Cerebral infarction, unspecified: Secondary | ICD-10-CM

## 2018-08-15 DIAGNOSIS — I63511 Cerebral infarction due to unspecified occlusion or stenosis of right middle cerebral artery: Secondary | ICD-10-CM

## 2018-08-15 LAB — LIPID PANEL
Cholesterol: 136 mg/dL (ref 0–200)
HDL: 38 mg/dL — AB (ref >40)
LDL Cholesterol: 77 mg/dL (ref 0–99)
Total CHOL/HDL Ratio: 3.6 RATIO
Triglycerides: 105 mg/dL (ref <150)
VLDL: 21 mg/dL (ref 0–40)

## 2018-08-15 LAB — HEMOGLOBIN A1C
Hgb A1c MFr Bld: 6.2 % — ABNORMAL HIGH (ref 4.8–5.6)
Mean Plasma Glucose: 131.24 mg/dL

## 2018-08-15 LAB — ECHOCARDIOGRAM COMPLETE
Height: 63 in
Weight: 3167.57 oz

## 2018-08-15 MED ORDER — CLOPIDOGREL BISULFATE 75 MG PO TABS
75.0000 mg | ORAL_TABLET | Freq: Every day | ORAL | Status: DC
  Administered 2018-08-15 – 2018-08-17 (×3): 75 mg via ORAL
  Filled 2018-08-15 (×3): qty 1

## 2018-08-15 MED ORDER — ATORVASTATIN CALCIUM 40 MG PO TABS
40.0000 mg | ORAL_TABLET | Freq: Every day | ORAL | Status: DC
  Administered 2018-08-15 – 2018-08-17 (×3): 40 mg via ORAL
  Filled 2018-08-15 (×3): qty 1

## 2018-08-15 MED ORDER — SODIUM CHLORIDE 0.9 % IV SOLN
INTRAVENOUS | Status: DC
  Administered 2018-08-15 – 2018-08-16 (×2): via INTRAVENOUS

## 2018-08-15 NOTE — Progress Notes (Signed)
PROGRESS NOTE    Amanda Saunders  NLG:[id number] DOB: 1956/06/22 DOA: 08/14/2018 PCP: Darreld Mclean, MD  Brief Narrative: 63 year old female with history of lupus, hypertension, history of prior stroke with mild residual left-sided weakness, cardiomyopathy presented to the ED with left arm tingling, weakness since yesterday morning she presented to [facility] hospital and was transferred to [facility]. -On work-up she was noted to have acute infarcts in the right MCA distribution  Assessment & Plan:   Acute right MCA infarct -With residual left sided paresthesias and weakness, worse from baseline -Neurology consulting -MRA with M1, M2 stenosis or occlusion -She is on aspirin 81 mg at baseline, currently on aspirin 325 mg daily with Plavix and atorvastatin -Follow-up echo and carotid duplex -LDL is 77, hemoglobin A1c 6.2 -PT/OT/SLP evaluations pending  History of lupus -Continue hydroxychloroquine  Borderline diabetes  Hypertension -Permissive hypertension in the setting of acute CVA   DVT prophylaxis: Lovenox Code Status: Code  family Communication:no Family at bedside, updated son per patient's request Disposition Plan: Pending stroke work-up, anticipate will need rehab  Consultants:   Neurology   Procedures:   Antimicrobials:    Subjective: -Continues to have left-sided weakness, numbness is slightly better  Objective: Vitals:   08/15/18 0200 08/15/18 0433 08/15/18 0800 08/15/18 1000  BP: (!) 110/58 122/63 139/80 133/73  Pulse: 69 80 77 70  Resp: 17 18 16 18   Temp: 98.1 F (36.7 C) 97.9 F (36.6 C) (!) 97.4 F (36.3 C)   TempSrc: Oral Oral Oral   SpO2: 96% 98% 99% 98%  Weight:      Height:        Intake/Output Summary (Last 24 hours) at 08/15/2018 1311 Last data filed at 08/15/2018 0600 Gross per 24 hour  Intake 200 ml  Output -  Net 200 ml   Filed Weights   08/14/18 2244  Weight: 89.8 kg    Examination:  General exam: Appears calm and  comfortable, no distress Respiratory system: Clear to auscultation. Respiratory effort normal. Cardiovascular system: S1 & S2 heard, RRR. No JVD, murmurs, rubs, gallops Gastrointestinal system: Abdomen is nondistended, soft and nontender.Normal bowel sounds heard. Central nervous system: Alert and orientedx3, decreased light touch in left upper extremity, motor left upper extremity is 3/5 with pronator drift, left lower extremity is 3+/5, DTR is 2-3+, plantars are withdrawal Extremities: No edema Skin: No rashes, lesions or ulcers Psychiatry: Judgement and insight appear normal. Mood & affect appropriate.     Data Reviewed:   CBC: Recent Labs  Lab 08/14/18 1323  WBC 7.1  NEUTROABS 5.2  HGB 14.1  HCT 45.1  MCV 86.6  PLT 081   Basic Metabolic Panel: Recent Labs  Lab 08/14/18 1323  NA 141  K 3.8  CL 105  CO2 27  GLUCOSE 90  BUN 13  CREATININE 1.01*  CALCIUM 9.0   GFR: Estimated Creatinine Clearance: 62.2 mL/min (A) (by C-G formula based on SCr of 1.01 mg/dL (H)). Liver Function Tests: Recent Labs  Lab 08/14/18 1323  AST 28  ALT 36  ALKPHOS 74  BILITOT 0.5  PROT 8.4*  ALBUMIN 4.7   No results for input(s): LIPASE, AMYLASE in the last 168 hours. No results for input(s): AMMONIA in the last 168 hours. Coagulation Profile: Recent Labs  Lab 08/14/18 1323  INR 0.98   Cardiac Enzymes: Recent Labs  Lab 08/14/18 1323  TROPONINI <0.03   BNP (last 3 results) No results for input(s): PROBNP in the last 8760 hours. HbA1C: Recent  Labs    08/15/18 0451  HGBA1C 6.2*   CBG: Recent Labs  Lab 08/14/18 1330 08/14/18 1351  GLUCAP 86 77   Lipid Profile: Recent Labs    08/15/18 0451  CHOL 136  HDL 38*  LDLCALC 77  TRIG 105  CHOLHDL 3.6   Thyroid Function Tests: No results for input(s): TSH, T4TOTAL, FREET4, T3FREE, THYROIDAB in the last 72 hours. Anemia Panel: No results for input(s): VITAMINB12, FOLATE, FERRITIN, TIBC, IRON, RETICCTPCT in the last 72  hours. Urine analysis:    Component Value Date/Time   COLORURINE STRAW (A) 08/14/2018 1555   APPEARANCEUR CLEAR 08/14/2018 1555   LABSPEC 1.008 08/14/2018 1555   PHURINE 6.0 08/14/2018 1555   GLUCOSEU NEGATIVE 08/14/2018 1555   HGBUR NEGATIVE 08/14/2018 1555   BILIRUBINUR NEGATIVE 08/14/2018 1555   BILIRUBINUR negative 01/18/2018 0940   KETONESUR NEGATIVE 08/14/2018 1555   PROTEINUR NEGATIVE 08/14/2018 1555   UROBILINOGEN 0.2 01/18/2018 0940   NITRITE NEGATIVE 08/14/2018 1555   LEUKOCYTESUR NEGATIVE 08/14/2018 1555   Sepsis Labs: @LABRCNTIP (procalcitonin:4,lacticidven:4)  )No results found for this or any previous visit (from the past 240 hour(s)).       Radiology Studies: Dg Chest 2 View  Result Date: 08/14/2018 CLINICAL DATA:  Dizziness and left arm tingling. EXAM: CHEST - 2 VIEW COMPARISON:  06/03/2018 FINDINGS: The heart size and mediastinal contours are within normal limits. Both lungs are clear. The visualized skeletal structures are unremarkable. IMPRESSION: No active cardiopulmonary disease. Electronically Signed   By: Ashley Royalty M.D.   On: 08/14/2018 22:05   Mr Brain Wo Contrast (neuro Protocol)  Result Date: 08/14/2018 CLINICAL DATA:  Left upper and lower extremity weakness and visual disturbance. Follow-up stroke. EXAM: MRI HEAD WITHOUT CONTRAST TECHNIQUE: Multiplanar, multiecho pulse sequences of the brain and surrounding structures were obtained without intravenous contrast. COMPARISON:  Head CT earlier same day.  MRI 05/28/2016. FINDINGS: Brain: Scattered foci of acute infarction in the right frontal cortical and subcortical brain consistent with micro embolic infarctions in the right middle cerebral artery territory. No large or confluent infarction. No hemorrhage or mass effect. Elsewhere, brainstem and cerebellum appear normal. Cerebral hemispheres show advanced chronic small-vessel ischemic changes affecting the deep and subcortical white matter. No large vessel  territory infarction. No mass lesion, hemorrhage, hydrocephalus or extra-axial collection. Vascular: Major vessels at the base of the brain show flow. Skull and upper cervical spine: Negative Sinuses/Orbits: Clear/normal Other: None IMPRESSION: Scattered acute subcortical and small cortical infarctions in the right frontal lobe most consistent with micro embolic infarctions in a right MCA branch vessel distribution or branch vessel occlusion. No large confluent infarction. No swelling, mass effect or hemorrhage. Extensive chronic small-vessel ischemic changes elsewhere throughout the cerebral hemispheric white matter. Electronically Signed   By: Nelson Chimes M.D.   On: 08/14/2018 19:43   Mr Jodene Nam Head Wo Contrast  Result Date: 08/15/2018 CLINICAL DATA:  Stroke. EXAM: MRA HEAD WITHOUT CONTRAST TECHNIQUE: Angiographic images of the Circle of Willis were obtained using MRA technique without intravenous contrast. COMPARISON:  Brain MRI 08/14/2018 FINDINGS: POSTERIOR CIRCULATION: --Basilar artery: Normal. --Posterior cerebral arteries: Normal. Both originate from the basilar artery. --Superior cerebellar arteries: Normal. --Inferior cerebellar arteries: There is a shared AICA/PICA origin on the right. Normal left AICA and PICA. ANTERIOR CIRCULATION: --Intracranial internal carotid arteries: Mild bilateral atherosclerotic irregularity without stenosis. --Anterior cerebral arteries: Normal. Both A1 segments are present. Patent anterior communicating artery. --Middle cerebral arteries: Loss of normal flow related enhancement within the distal right M1 segment  and proximal M2 segments. Severe short segment stenosis of the left M2 segment superior division. --Posterior communicating arteries: Present on the left, absent on the right. IMPRESSION: 1. Loss of flow related enhancement within the distal right M1 segment and proximal M2 segments, indicating severe stenosis or short segment occlusion. 2. Left MCA M2 segment  superior division proximal severe stenosis or short segment occlusion. Electronically Signed   By: Ulyses Jarred M.D.   On: 08/15/2018 01:18   Ct Head Code Stroke Wo Contrast  Result Date: 08/14/2018 CLINICAL DATA:  Code stroke. Left upper and lower extremity weakness. Left-sided visual disturbance. EXAM: CT HEAD WITHOUT CONTRAST TECHNIQUE: Contiguous axial images were obtained from the base of the skull through the vertex without intravenous contrast. COMPARISON:  CT and MRI studies 2017. FINDINGS: Brain: The brainstem and cerebellum appear normal. Cerebral hemispheres show chronic appearing low-density throughout the hemispheric white matter consistent with chronic small vessel ischemic change. No CT finding of acute stroke affecting the right hemisphere. No mass lesion, hemorrhage, hydrocephalus or extra-axial collection. Vascular: There is atherosclerotic calcification of the major vessels at the base of the brain. No sign of acute hyperdense vessel. Skull: Negative Sinuses/Orbits: Clear/normal Other: None ASPECTS ([place] Stroke Program Early CT Score) - Ganglionic level infarction (caudate, lentiform nuclei, internal capsule, insula, M1-M3 cortex): 7 - Supraganglionic infarction (M4-M6 cortex): 3 Total score (0-10 with 10 being normal): 10 IMPRESSION: 1. No acute finding by CT. Chronic small-vessel ischemic changes of the cerebral hemispheric white matter. 2. ASPECTS is 10. * These results were called by telephone at the time of interpretation on 08/14/2018 at 1:36 pm to Dr. Duffy Bruce , who verbally acknowledged these results. Electronically Signed   By: Nelson Chimes M.D.   On: 08/14/2018 13:40   Vas US Carotid (at [place] Only)  Result Date: 08/15/2018 Carotid Arterial Duplex Study Indications:  CVA. Risk Factors: Hypertension. Performing Technologist: Maudry Mayhew MHA, RDMS, RVT, RDCS  Examination Guidelines: A complete evaluation includes B-mode imaging, spectral Doppler, color Doppler,  and power Doppler as needed of all accessible portions of each vessel. Bilateral testing is considered an integral part of a complete examination. Limited examinations for reoccurring indications may be performed as noted.  Right Carotid Findings: +----------+--------+--------+--------+--------+--------+           PSV cm/sEDV cm/sStenosisDescribeComments +----------+--------+--------+--------+--------+--------+ CCA Prox  74      16                               +----------+--------+--------+--------+--------+--------+ CCA Distal44      12                               +----------+--------+--------+--------+--------+--------+ ICA Prox  32      9                                +----------+--------+--------+--------+--------+--------+ ICA Distal38      14                               +----------+--------+--------+--------+--------+--------+ ECA       36      6                                +----------+--------+--------+--------+--------+--------+ +----------+--------+-------+----------------+-------------------+  PSV cm/sEDV cmsDescribe        Arm Pressure (mmHG) +----------+--------+-------+----------------+-------------------+ [id number]            Multiphasic, WNL                    +----------+--------+-------+----------------+-------------------+ +---------+--------+--+--------+--+---------+ VertebralPSV cm/s49EDV cm/s17Antegrade +---------+--------+--+--------+--+---------+  Left Carotid Findings: +----------+--------+--------+--------+-----------------------+--------+           PSV cm/sEDV cm/sStenosisDescribe               Comments +----------+--------+--------+--------+-----------------------+--------+ CCA Prox  106     26              heterogenous and smooth         +----------+--------+--------+--------+-----------------------+--------+ CCA Distal69      21                                               +----------+--------+--------+--------+-----------------------+--------+ ICA Prox  40      12                                              +----------+--------+--------+--------+-----------------------+--------+ ICA Distal62      25                                              +----------+--------+--------+--------+-----------------------+--------+ ECA       61      12                                              +----------+--------+--------+--------+-----------------------+--------+ +----------+--------+--------+----------------+-------------------+ SubclavianPSV cm/sEDV cm/sDescribe        Arm Pressure (mmHG) +----------+--------+--------+----------------+-------------------+           103             Multiphasic, WNL                    +----------+--------+--------+----------------+-------------------+ +---------+--------+--+--------+--+---------+ VertebralPSV cm/s69EDV cm/s18Antegrade +---------+--------+--+--------+--+---------+  Summary: Right Carotid: Velocities in the right ICA are consistent with a 1-39% stenosis. Left Carotid: Velocities in the left ICA are consistent with a 1-39% stenosis. Vertebrals:  Bilateral vertebral arteries demonstrate antegrade flow. Subclavians: Normal flow hemodynamics were seen in bilateral subclavian              arteries. *See table(s) above for measurements and observations.     Preliminary         Scheduled Meds: . aspirin  300 mg Rectal Daily   Or  . aspirin  325 mg Oral Daily  . atorvastatin  40 mg Oral Daily  . clopidogrel  75 mg Oral Daily  . enoxaparin (LOVENOX) injection  40 mg Subcutaneous Q24H  . hydroxychloroquine  200 mg Oral BID  . naproxen sodium  550 mg Oral BID WC  . sodium chloride flush  3 mL Intravenous Once   Continuous Infusions: . sodium chloride 50 mL/hr at 08/15/18 0958     LOS: 1 day    Time spent: 72min    Domenic Polite, MD [facility] Page via [email or web address],  password  [email or web address] After 7PM please contact night-coverage  08/15/2018, 1:11 PM

## 2018-08-15 NOTE — Evaluation (Signed)
Speech Language Pathology Evaluation Patient Details Name: Amanda Saunders MRN: [id number] DOB: Nov 09, 1955 Today's Date: 08/15/2018 Time: 4481-8563 SLP Time Calculation (min) (ACUTE ONLY): 21 min  Problem List:  Patient Active Problem List   Diagnosis Date Noted  . Acute ischemic stroke ([place]) 08/14/2018  . HTN (hypertension) 08/14/2018  . Pre-diabetes 04/03/2016  . History of CVA (cerebrovascular accident) 03/12/2016  . Chest pain at rest 03/12/2016  . Peripheral edema 03/12/2016   Past Medical History:  Past Medical History:  Diagnosis Date  . Cardiomyopathy ([place])   . Hypertension   . PUD (peptic ulcer disease)   . Stroke ([place])   . Vertigo    Past Surgical History:  Past Surgical History:  Procedure Laterality Date  . BACK SURGERY    . CARPAL TUNNEL RELEASE    . NECK SURGERY     2015   HPI:  Pt is a 63 y.o. female with medical history significant of HTN, prior stroke with residual L sided weakness.  Patient presents to the ED with c/o dizziness, L arm tingling and heaviness. 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   Assessment / Plan / Recommendation Clinical Impression  Pt scored a 23/30  on the MOCA, suggestive of cognitive impairment. She primarily had difficulty with retrieval of new information, unable to recall any words independently during delayed recall task. She did however recall 5/5 words when given cues. Minor errors in other subtests were likely indicative of decreased working memory. Pt subjectively reported difficulty focusing this morning while working with other providers. Recommend additional SLP f/u to focus on memory and higher level cognitive skills to maximize functional independence.     SLP Assessment  SLP Recommendation/Assessment: Patient needs continued Speech Lanaguage Pathology Services SLP Visit Diagnosis: Cognitive communication deficit (R41.841)    Follow Up Recommendations  Home health  SLP;24 hour supervision/assistance    Frequency and Duration min 2x/week  2 weeks      SLP Evaluation Cognition  Overall Cognitive Status: Impaired/Different from baseline Arousal/Alertness: Awake/alert Orientation Level: Oriented X4 Attention: Selective Selective Attention: Appears intact Memory: Impaired Memory Impairment: Decreased recall of new information;Retrieval deficit Awareness: Appears intact Problem Solving: Appears intact Safety/Judgment: Appears intact       Comprehension  Auditory Comprehension Overall Auditory Comprehension: Appears within functional limits for tasks assessed    Expression Expression Primary Mode of Expression: Verbal Verbal Expression Overall Verbal Expression: Appears within functional limits for tasks assessed Written Expression Dominant Hand: Left   Oral / Motor  Motor Speech Overall Motor Speech: Appears within functional limits for tasks assessed   GO                    Venita Sheffield Radley Barto 08/15/2018, 4:40 PM   Nuala Alpha, M.A. [place] [facility] Environmental education officer [phone] Office [phone]

## 2018-08-15 NOTE — Progress Notes (Signed)
Pt back to unit from MRI.

## 2018-08-15 NOTE — Progress Notes (Signed)
Carotid artery duplex completed. Refer to "CV Proc" under chart review to view preliminary results.  08/15/2018 12:57 PM Maudry Mayhew, MHA, RVT, RDCS, RDMS

## 2018-08-15 NOTE — Plan of Care (Signed)
Patient stable, VSS, NAD noted, worked well w/therapies, stroke workup continued. Discussed POC with patient, agreeable with plan, denies question/concerns at this time.

## 2018-08-15 NOTE — Progress Notes (Addendum)
Patient 63 years old female  Admitted from [facility] ED, was a transfer from [facility] with L sided numbness Nelle Don Danielle Dess and dizziness. Alert and oriented x 4  MAE with  LUE and LLE moderately weaker than   Rt side with decrease sensation to left side.. Pupils equal and reactive.  Initial assessment done NIH 4  Cardiac monitor in use and verification completed . POC explained to pt & verbalized good understanding. .  Safety measures in place. RN will continue to monitor.

## 2018-08-15 NOTE — Progress Notes (Signed)
Echocardiogram 2D Echocardiogram has been performed.  08/15/2018 12:42 PM Maudry Mayhew, MHA, RVT, RDCS, RDMS

## 2018-08-15 NOTE — Plan of Care (Signed)
  Problem: Coping: Goal: Will verbalize positive feelings about self Outcome: Progressing   Problem: Education: Goal: Knowledge of patient specific risk factors addressed and post discharge goals established will improve Outcome: Progressing   Problem: Education: Goal: Knowledge of secondary prevention will improve Outcome: Progressing   Problem: Coping: Goal: Will identify appropriate support needs Outcome: Progressing

## 2018-08-15 NOTE — Evaluation (Signed)
Physical Therapy Evaluation Patient Details Name: Amanda Saunders MRN: [id number] DOB: 1956/07/12 Today's Date: 08/15/2018   History of Present Illness    63 y.o. female with medical history significant of HTN, prior stroke with residual L sided weakness.  Patient presents to the ED with c/o dizziness, L arm tingling and heaviness. MRI shows multiple small acute embolic strokes in R MCA distribution.   Clinical Impression  Pt presents with mild to moderate limitations to functional mobility due to fatigue, decr motor control, weakness in extremities, resulting in dependency on external device for walking and impairments in balance and gait.  Pt will benefit from PT to address deficits with goal of returning to fully independent in role as mother and grandmother, ideally in intensive setting for short stay as pt has excellent motivation and potential for recovery.  CIR screening ordered and if candidate, this is the preferred postacute setting for rehab; otherwise short SNF prior to returning home.  If pt presents with rapid recovery in next 24-36 hours, can reassess d/c recommendations.  Recommend OOB daytime as tolerated for improved heart/lung function and assisted to bathroom for increased volume of daily activity. PT will initiate care in acute setting and participate in d/c planning.  See below for details of exam findings and care plan for goals of care.     Follow Up Recommendations SNF    Equipment Recommendations  Rolling walker with 5" wheels    Recommendations for Other Services       Precautions / Restrictions Precautions Precautions: Fall Precaution Comments: bed alarm, l side weakness, pt NOT impulsive      Mobility  Bed Mobility Overal bed mobility: Independent             General bed mobility comments: HOB up but no indication of significant difficulty moving to EOB  Transfers Overall transfer level: Needs assistance Equipment used: None Transfers: Sit  to/from Stand Sit to Stand: Supervision         General transfer comment: pt slightly unsteady and leans legs against bed or leans forward to hold IV pole for stability, supervision for safety  Ambulation/Gait Ambulation/Gait assistance: Supervision Gait Distance (Feet): 100 Feet Assistive device: Rolling walker (2 wheeled) Gait Pattern/deviations: Step-through pattern;Decreased stride length;Wide base of support;Trunk flexed Gait velocity: slowed, unmeasured   General Gait Details: slightly flexed trunk, improved with use of RW, and pt more confident with stepping.  Mild indication of guarding with BOS and shortened stride.  Motivated to move, cues for energy management with new fatigue due to cva.  Needs cues to keep RW closer for improved posture, and needs occasional assist or cues to realign RW.  Incr difficulty turning 180 degrees  Stairs            Wheelchair Mobility    Modified Rankin (Stroke Patients Only) Modified Rankin (Stroke Patients Only) Pre-Morbid Rankin Score: No symptoms Modified Rankin: Moderate disability     Balance Overall balance assessment: Mild deficits observed, not formally tested                                           Pertinent Vitals/Pain Pain Assessment: 0-10 Pain Score: 3  Pain Location: left side Pain Descriptors / Indicators: Aching Pain Intervention(s): Limited activity within patient's tolerance    Home Living Family/patient expects to be discharged to:: Private residence Living Arrangements: Children;Non-relatives/Friends(see nursing comment re: boyfriend)  Available Help at Discharge: Family;Available PRN/intermittently Type of Home: House Home Access: Stairs to enter Entrance Stairs-Rails: None Entrance Stairs-Number of Steps: 1 Home Layout: One level Home Equipment: None Additional Comments: son, grandkids at home, son works days    Prior Function Level of Independence: Independent          Comments: drives son, g'kids to work/school, cooks, keeps house, no problems at baseline     Hand Dominance   Dominant Hand: Right    Extremity/Trunk Assessment   Upper Extremity Assessment Upper Extremity Assessment: Defer to OT evaluation    Lower Extremity Assessment Lower Extremity Assessment: LLE deficits/detail LLE Deficits / Details: generally weak left side with functional activities, reported as mild aching lateral aspect of LE and fatigue with short bouts    Cervical / Trunk Assessment Cervical / Trunk Assessment: Normal  Communication   Communication: No difficulties  Cognition Arousal/Alertness: Awake/alert Behavior During Therapy: WFL for tasks assessed/performed Overall Cognitive Status: Within Functional Limits for tasks assessed                                        General Comments General comments (skin integrity, edema, etc.): noting dependency on external support for balance/stability and persisent mild woozy feeling self-limitiing    Exercises     Assessment/Plan    PT Assessment Patient needs continued PT services  PT Problem List Cardiopulmonary status limiting activity;Decreased mobility;Decreased balance;Decreased activity tolerance;Decreased strength       PT Treatment Interventions DME instruction;Gait training;Functional mobility training;Therapeutic activities;Therapeutic exercise;Balance training;Neuromuscular re-education;Patient/family education    PT Goals (Current goals can be found in the Care Plan section)  Acute Rehab PT Goals Patient Stated Goal: return to PLOF and resume care role in family PT Goal Formulation: With patient Time For Goal Achievement: 08/29/18 Potential to Achieve Goals: Good    Frequency Min 4X/week   Barriers to discharge Decreased caregiver support son works days    Co-evaluation               AM-PAC PT "6 Clicks" Mobility  Outcome Measure Help needed turning from your back  to your side while in a flat bed without using bedrails?: None Help needed moving from lying on your back to sitting on the side of a flat bed without using bedrails?: None Help needed moving to and from a bed to a chair (including a wheelchair)?: A Little Help needed standing up from a chair using your arms (e.g., wheelchair or bedside chair)?: None Help needed to walk in hospital room?: A Little Help needed climbing 3-5 steps with a railing? : A Little 6 Click Score: 21    End of Session Equipment Utilized During Treatment: Gait belt Activity Tolerance: No increased pain;Patient limited by fatigue Patient left: in bed;with call bell/phone within reach Nurse Communication: Mobility status PT Visit Diagnosis: Hemiplegia and hemiparesis;Unsteadiness on feet (R26.81);Muscle weakness (generalized) (M62.81) Hemiplegia - Right/Left: Left Hemiplegia - dominant/non-dominant: Non-dominant Hemiplegia - caused by: Cerebral infarction    Time: 1016-1040 PT Time Calculation (min) (ACUTE ONLY): 24 min   Charges:   PT Evaluation $PT Eval Low Complexity: 1 Low PT Treatments $Gait Training: 8-22 mins        Kearney Hard, PT, DPT, MS Board Certified Geriatric Clinical Specialist  Herbie Drape 08/15/2018, 11:11 AM

## 2018-08-15 NOTE — Evaluation (Signed)
Occupational Therapy Evaluation Patient Details Name: Amanda Saunders MRN: [id number] DOB: 30-Sep-1955 Today's Date: 08/15/2018    History of Present Illness Pt is a 63 y.o. female with medical history significant of HTN, prior stroke with residual L sided weakness.  Patient presents to the ED with c/o dizziness, L arm tingling and heaviness. 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   Clinical Impression   PTA patient independent and driving.  Admitted for above and limited by L sided (dominant) UE weakness and impaired coordination, impaired balance and decreased activity tolerance, as well as decreased safety awareness.  Patient currently requires min assist for UB ADLs, min assist for LB ADLs, min guard to close supervision for basic transfers using RW.  She was educated on safety, fall prevention, and recommendations. Patient will benefit from continued OT services while admitted and after dc at SNF level in order to optimize independence and safety with ADLs/mobility. Will continue to follow and assess needs, may progress to [place] level.       Follow Up Recommendations  SNF;Other (comment)(may progress to [facility] level)    Equipment Recommendations  3 in 1 bedside commode    Recommendations for Other Services Rehab consult     Precautions / Restrictions Precautions Precautions: Fall Restrictions Weight Bearing Restrictions: No      Mobility Bed Mobility Overal bed mobility: Independent             General bed mobility comments: EOB to supine with independence, no assist required  Transfers Overall transfer level: Needs assistance Equipment used: None;Rolling walker (2 wheeled) Transfers: Sit to/from Stand Sit to Stand: Min guard;Supervision         General transfer comment: pt standing in room upon entry, sit to stand from EOB with supervision using RW and min guard without AD     Balance Overall balance assessment:  Needs assistance Sitting-balance support: No upper extremity supported;Feet supported Sitting balance-Leahy Scale: Good     Standing balance support: No upper extremity supported;During functional activity Standing balance-Leahy Scale: Fair Standing balance comment: reliant on UE support                            ADL either performed or assessed with clinical judgement   ADL Overall ADL's : Needs assistance/impaired     Grooming: Standing;Minimal assistance   Upper Body Bathing: Minimal assistance;Sitting   Lower Body Bathing: Minimal assistance;Sit to/from stand   Upper Body Dressing : Minimal assistance;Sitting   Lower Body Dressing: Sit to/from stand;Min guard Lower Body Dressing Details (indicate cue type and reason): able to don/doff socks, min assist in standing  Toilet Transfer: Min guard;Ambulation;Regular Toilet;RW           Functional mobility during ADLs: Min guard;Rolling walker General ADL Comments: limited by safety awareness, L sided weakness, impaired balance.      Vision Baseline Vision/History: Wears glasses Wears Glasses: At all times Patient Visual Report: Blurring of vision(initally but improved) Vision Assessment?: Yes Eye Alignment: Within Functional Limits Ocular Range of Motion: Within Functional Limits Alignment/Gaze Preference: Within Defined Limits Tracking/Visual Pursuits: Able to track stimulus in all quads without difficulty Saccades: Within functional limits Visual Fields: No apparent deficits     Perception     Praxis      Pertinent Vitals/Pain Pain Assessment: No/denies pain     Hand Dominance Left   Extremity/Trunk Assessment Upper Extremity Assessment Upper Extremity Assessment:  LUE deficits/detail LUE Deficits / Details: grossly 3+/5 MMT, dysmetric and diminished sensation  LUE Sensation: decreased light touch LUE Coordination: decreased fine motor;decreased gross motor   Lower Extremity Assessment Lower  Extremity Assessment: Defer to PT evaluation   Cervical / Trunk Assessment Cervical / Trunk Assessment: Normal   Communication Communication Communication: No difficulties   Cognition Arousal/Alertness: Awake/alert Behavior During Therapy: WFL for tasks assessed/performed Overall Cognitive Status: Impaired/Different from baseline Area of Impairment: Safety/judgement                         Safety/Judgement: Decreased awareness of safety     General Comments: good awareness of deficits, but found standing at sink upon entering room with decreased safety awareness    General Comments  patient found standing in room upon entry, reporting using restroom without assistance; noted forward flexed posture and furniture walking in room pushing IV pole; transitioned to walker and posture improved and transitioned from CGA to close supervision using RW     Exercises     Shoulder Instructions      Home Living Family/patient expects to be discharged to:: Private residence Living Arrangements: Children Available Help at Discharge: Family;Available PRN/intermittently Type of Home: House Home Access: Stairs to enter [employer] of Steps: 1 Entrance Stairs-Rails: None Home Layout: One level     Bathroom Shower/Tub: Teacher, early years/pre: Standard     Home Equipment: None   Additional Comments: family at home, reports daughter is home during day but she sleeps       Prior Functioning/Environment Level of Independence: Independent        Comments: drives son, g'kids to work/school, cooks, keeps house        OT Problem List:        OT Treatment/Interventions:      OT Goals(Current goals can be found in the care plan section) Acute Rehab OT Goals Patient Stated Goal: return to PLOF and resume care role in family  OT Frequency: Min 3X/week   Barriers to D/C:            Co-evaluation              AM-PAC OT "6 Clicks" Daily  Activity     Outcome Measure Help from another person eating meals?: A Little Help from another person taking care of personal grooming?: A Little Help from another person toileting, which includes using toliet, bedpan, or urinal?: A Little Help from another person bathing (including washing, rinsing, drying)?: A Little Help from another person to put on and taking off regular upper body clothing?: A Little Help from another person to put on and taking off regular lower body clothing?: A Little 6 Click Score: 18   End of Session Equipment Utilized During Treatment: Rolling walker Nurse Communication: Mobility status  Activity Tolerance: Patient tolerated treatment well Patient left: in bed;with call bell/phone within reach;with bed alarm set  OT Visit Diagnosis: Other abnormalities of gait and mobility (R26.89);Muscle weakness (generalized) (M62.81);Hemiplegia and hemiparesis Hemiplegia - Right/Left: Left Hemiplegia - dominant/non-dominant: Dominant Hemiplegia - caused by: Cerebral infarction                Time: 2979-8921 OT Time Calculation (min): 16 min Charges:  OT General Charges $OT Visit: 1 Visit OT Evaluation $OT Eval Moderate Complexity: [place], OT Acute Rehabilitation Services Pager [phone] Office [phone]    Delight Stare 08/15/2018, 2:48 PM

## 2018-08-15 NOTE — Progress Notes (Signed)
Rehab Admissions Coordinator Note:  Patient was screened by Cleatrice Burke for appropriateness for an Inpatient Acute Rehab Consult per PT recommendation. Noted pt is supervision level  100 feet with RW. Pt not in need of an inpt rehab admission at this level.  Cleatrice Burke RN MSN 08/15/2018, 2:00 PM  I can be reached at [phone].

## 2018-08-15 NOTE — Progress Notes (Signed)
STROKE TEAM PROGRESS NOTE   SUBJECTIVE (INTERVAL HISTORY) No family is at the bedside.  I also talked with pt son over the phone. Pt still has mild left UE numbness and weakness. CUS and TTE unremarkable. MRA showed right MCA stenosis, progressed from 2017. Pt taking ASA 81 and lipitor 40 at home.    OBJECTIVE Vitals:   08/15/18 0200 08/15/18 0433 08/15/18 0800 08/15/18 1000  BP: (!) 110/58 122/63 139/80 133/73  Pulse: 69 80 77 70  Resp: 17 18 16 18   Temp: 98.1 F (36.7 C) 97.9 F (36.6 C) (!) 97.4 F (36.3 C)   TempSrc: Oral Oral Oral   SpO2: 96% 98% 99% 98%  Weight:      Height:        CBC:  Recent Labs  Lab 08/14/18 1323  WBC 7.1  NEUTROABS 5.2  HGB 14.1  HCT 45.1  MCV 86.6  PLT 628    Basic Metabolic Panel:  Recent Labs  Lab 08/14/18 1323  NA 141  K 3.8  CL 105  CO2 27  GLUCOSE 90  BUN 13  CREATININE 1.01*  CALCIUM 9.0    Lipid Panel:     Component Value Date/Time   CHOL 136 08/15/2018 0451   TRIG 105 08/15/2018 0451   HDL 38 (L) 08/15/2018 0451   CHOLHDL 3.6 08/15/2018 0451   VLDL 21 08/15/2018 0451   LDLCALC 77 08/15/2018 0451   HgbA1c:  Lab Results  Component Value Date   HGBA1C 6.2 (H) 08/15/2018   Urine Drug Screen: No results found for: LABOPIA, COCAINSCRNUR, LABBENZ, AMPHETMU, THCU, LABBARB  Alcohol Level No results found for: [facility]  IMAGING  Dg Chest 2 View 08/14/2018 IMPRESSION:  No active cardiopulmonary disease.   Mr Brain Wo Contrast (neuro Protocol) 08/14/2018 IMPRESSION:  Scattered acute subcortical and small cortical infarctions in the right frontal lobe most consistent with micro embolic infarctions in a right MCA branch vessel distribution or branch vessel occlusion. No large confluent infarction. No swelling, mass effect or hemorrhage. Extensive chronic small-vessel ischemic changes elsewhere throughout the cerebral hemispheric white matter.   Mr Jodene Nam Head Wo Contrast 08/15/2018 IMPRESSION:  1. Loss of flow related  enhancement within the distal right M1 segment and proximal M2 segments, indicating severe stenosis or short segment occlusion.  2. Left MCA M2 segment superior division proximal severe stenosis or short segment occlusion.   Ct Head Code Stroke Wo Contrast  08/14/2018 IMPRESSION:  1. No acute finding by CT. Chronic small-vessel ischemic changes of the cerebral hemispheric white matter.  2. ASPECTS is 10.   Transthoracic Echocardiogram  - Left ventricle: The cavity size was normal. Wall thickness was   normal. Systolic function was mildly reduced. The estimated   ejection fraction was in the range of 45% to 50%. Doppler   parameters are consistent with abnormal left ventricular   relaxation (grade 1 diastolic dysfunction). - Aortic valve: Valve area (VTI): 1.98 cm^2. Valve area (Vmax): 1.7   cm^2. - Atrial septum: No defect or patent foramen ovale was identified. - Pulmonary arteries: Systolic pressure was moderately increased.   PA peak pressure: 40 mm Hg (S).  Bilateral Carotid Dopplers  Right Carotid: Velocities in the right ICA are consistent with a 1-39% stenosis.  Left Carotid: Velocities in the left ICA are consistent with a 1-39% stenosis.  Vertebrals:  Bilateral vertebral arteries demonstrate antegrade flow. Subclavians: Normal flow hemodynamics were seen in bilateral subclavian  arteries.   PHYSICAL EXAM  Temp:  [97.4 F (36.3 C)-98.1 F (36.7 C)] 97.4 F (36.3 C) (01/26 0800) Pulse Rate:  [59-80] 70 (01/26 1000) Resp:  [15-20] 18 (01/26 1000) BP: (110-161)/(58-82) 133/73 (01/26 1000) SpO2:  [96 %-99 %] 98 % (01/26 1000) Weight:  [89.8 kg] 89.8 kg (01/25 2244)  General - Well nourished, well developed, in no apparent distress.  Ophthalmologic - fundi not visualized due to noncooperation.  Cardiovascular - Regular rate and rhythm.  Mental Status -  Level of arousal and orientation to time, place, and person were intact. Language including  expression, naming, repetition, comprehension was assessed and found intact. Fund of Knowledge was assessed and was intact.  Cranial Nerves II - XII - II - Visual field intact OU. III, IV, VI - Extraocular movements intact. V - Facial sensation intact bilaterally. VII - Facial movement intact bilaterally. VIII - Hearing & vestibular intact bilaterally. X - Palate elevates symmetrically. XI - Chin turning & shoulder shrug intact bilaterally XII - Tongue protrusion intact.  Motor Strength - The patient's strength was normal in all extremities except left UE 4/5 with pronator drif.  Bulk was normal and fasciculations were absent.   Motor Tone - Muscle tone was assessed at the neck and appendages and was normal.  Reflexes - The patient's reflexes were symmetrical in all extremities and she had no pathological reflexes.  Sensory - Light touch, temperature/pinprick were assessed and were decreased on the LUE, about 50% of the right.    Coordination - The patient had normal movements in the hands with no ataxia or dysmetria, but slow on the left.  Tremor was absent.  Gait and Station - deferred.   ASSESSMENT/PLAN Amanda Saunders is a 63 y.o. female with history of Htn, previous stroke with residual left sided weakness, vertigo, angina, prediabetes and cardiomyopathy presenting with dizziness, N/V, HA, left arm tremor, left arm tingling and arm heaviness.. She did not receive IV t-PA due to late presentation.  Stroke: scattered right MCA cortical and subcortical with right MCA/ACA and MCA/PCA watershed area small infarcts - likely due to worsening right MCA severe stenosis  Resultant LUE numbness and weakness  CT head -  - no acute findings.  MRI head - Scattered acute subcortical and small cortical infarctions in the right frontal lobe   MRA head - Loss of flow related enhancement within the distal right M1 segment and proximal M2 segments, indicating severe stenosis or short  segment occlusion. Left MCA M2 segment superior division proximal severe stenosis or short segment occlusion.   Carotid Doppler unremarkable  2D Echo EF 45-50%  LDL - 77  HgbA1c - 6.2  UDS - not performed  VTE prophylaxis - Lovenox  Diet  - Heart healthy with thin liquids.  aspirin 81 mg daily prior to admission, now on aspirin 325 mg daily and clopidogrel 75 mg daily. Continue ASA 325 and plavix 75 DAPT for 3 months and then plavix alone given intracranial stenosis.   Patient counseled to be compliant with her antithrombotic medications  Ongoing aggressive stroke risk factor management  Therapy recommendations:  pending  Disposition:  Pending  Intracranial stenosis  05/2016 MRA right M1/M2 moderate to severe stenosis with left M2 severe stenosis  This admission, MRA showed wosening right M1/M2 severe stenosis as well as unchanged left M2 severe stenosis  Recommend DAPT with ASA 325 and plavix 75 for 3 months and then plavix alone  Hypertension  Blood pressure somewhat low at  times   Permissive hypertension (OK if < 220/120) but gradually normalize in 2-3 days . Long-term BP goal normotensive  Hyperlipidemia  Lipid lowering medication PTA:  Lipitor 40 mg daily  LDL 77, goal < 70  Current lipid lowering medication: Lipitor 40 mg daily  Continue statin at discharge  Diabetes  HgbA1c 6.2, goal < 7.0  Controlled  SSI  CBG monitoring  Other Stroke Risk Factors  Advanced age  Former cigarette smoker - quit 16 years ago  ETOH use, advised to drink no more than 1 alcoholic beverage per day.  Obesity, Body mass index is 35.07 kg/m., recommend weight loss, diet and exercise as appropriate   Hx stroke/TIA - 2015 with left sided residue  Family hx stroke (mother)  Other Active Problems  Creatinine - 1.01  Hx of joint pain - positive ANA, but RA ruled out per records - now on Plaquenil and Anaprox  Hospital day # 1  Neurology will sign off.  Please call with questions. Pt will follow up with Dr. Tomi Likens at [facility] in about 4 weeks. Thanks for the consult.  I spent  35 minutes in total face-to-face time with the patient, more than 50% of which was spent in counseling and coordination of care, reviewing test results, images and medication, and discussing the diagnosis of recurrent stroke, right MCA stenosis worsening, HTN, HLD, treatment plan and potential prognosis. This patient's care requiresreview of multiple databases, neurological assessment, discussion with family, other specialists and medical decision making of high complexity.   Rosalin Hawking, MD PhD Stroke Neurology 08/15/2018 1:25 PM   To contact Stroke Continuity provider, please refer to [email or web address] After hours, contact General Neurology

## 2018-08-16 MED ORDER — IBUPROFEN 200 MG PO TABS
400.0000 mg | ORAL_TABLET | Freq: Four times a day (QID) | ORAL | Status: DC | PRN
  Administered 2018-08-16: 400 mg via ORAL
  Filled 2018-08-16: qty 2

## 2018-08-16 NOTE — Plan of Care (Signed)
Patient stable, discussed POC with patient, agreeable with plan, denies question/concerns at this time.  

## 2018-08-16 NOTE — Progress Notes (Signed)
Physical Therapy Treatment Patient Details Name: Amanda Saunders MRN: [id number] DOB: 1955/10/28 Today's Date: 08/16/2018    History of Present Illness Pt is a 63 y.o. female with medical history significant of HTN, prior stroke with residual L sided weakness.  Patient presents to the ED with c/o dizziness, L arm tingling and heaviness. 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    PT Comments    Pt has a symptom with L temple HA brought on by being up to stand and walk.  It did not make her dizzy per se, just noticeable for her.  Mobility was better, used AD to walk on hall and steady herself, but did require it to balance safely.  I for all transfers to BR and bedside, and had a ck of vitals to see if anything changed with movement.  BP standing initially was 134/84, pulse 80, O2 sat 98%;  After 3 minutes was 142/70, pulse 78 and O2 sat 100%.  Informed nursing of all the findings, and did not change DC recommendation due to her HA on temple with stroke findings.  Will await MD to release her with this to return home, as mobility is manageable but the HA is not a PT-related symptom to approve or disapprove.   Follow Up Recommendations  SNF     Equipment Recommendations  Rolling walker with 5" wheels    Recommendations for Other Services       Precautions / Restrictions Precautions Precautions: Fall Restrictions Weight Bearing Restrictions: No    Mobility  Bed Mobility Overal bed mobility: Independent                Transfers Overall transfer level: Needs assistance Equipment used: Rolling walker (2 wheeled) Transfers: Sit to/from Stand Sit to Stand: Supervision(for safety)         General transfer comment: using wall bar, walker, bed rail, IV pole  Ambulation/Gait Ambulation/Gait assistance: Supervision Gait Distance (Feet): 130 Feet(30+100) Assistive device: Rolling walker (2 wheeled);IV Pole Gait  Pattern/deviations: Step-through pattern;Decreased stride length;Narrow base of support(prompted herself to stand more upright) Gait velocity: paced Gait velocity interpretation: <1.31 ft/sec, indicative of household Metallurgist Rankin (Stroke Patients Only) Modified Rankin (Stroke Patients Only) Pre-Morbid Rankin Score: No symptoms Modified Rankin: Slight disability     Balance Overall balance assessment: Needs assistance Sitting-balance support: Feet supported Sitting balance-Leahy Scale: Good     Standing balance support: Bilateral upper extremity supported;During functional activity Standing balance-Leahy Scale: Fair Standing balance comment: requires RW or IV pole to support posture and maintain control of path                            Cognition Arousal/Alertness: Awake/alert Behavior During Therapy: WFL for tasks assessed/performed Overall Cognitive Status: Within Functional Limits for tasks assessed Area of Impairment: Safety/judgement                                      Exercises      General Comments General comments (skin integrity, edema, etc.): improved feeling of head from woozy per last PT note to HA feeling L temple, not supported by BP or pulse/O2 sat findings.  Talked with nursing about the symptom and  shared vitals      Pertinent Vitals/Pain Pain Assessment: Faces Faces Pain Scale: Hurts little more Pain Location: L temple with standing Pain Descriptors / Indicators: Aching Pain Intervention(s): Limited activity within patient's tolerance;Monitored during session(talked with nursing about it)    Home Living                      Prior Function            PT Goals (current goals can now be found in the care plan section) Acute Rehab PT Goals Patient Stated Goal: to get home safely with family being concerned for her Progress towards PT goals:  Progressing toward goals    Frequency    Min 4X/week      PT Plan Current plan remains appropriate    Co-evaluation              AM-PAC PT "6 Clicks" Mobility   Outcome Measure  Help needed turning from your back to your side while in a flat bed without using bedrails?: None Help needed moving from lying on your back to sitting on the side of a flat bed without using bedrails?: None Help needed moving to and from a bed to a chair (including a wheelchair)?: A Little Help needed standing up from a chair using your arms (e.g., wheelchair or bedside chair)?: A Little Help needed to walk in hospital room?: A Little Help needed climbing 3-5 steps with a railing? : Total 6 Click Score: 18    End of Session Equipment Utilized During Treatment: Gait belt Activity Tolerance: Patient limited by fatigue;Treatment limited secondary to medical complications (Comment) Patient left: in bed;with call bell/phone within reach;with bed alarm set Nurse Communication: Mobility status PT Visit Diagnosis: Hemiplegia and hemiparesis;Unsteadiness on feet (R26.81);Muscle weakness (generalized) (M62.81) Hemiplegia - Right/Left: Left Hemiplegia - dominant/non-dominant: Non-dominant Hemiplegia - caused by: Cerebral infarction     Time: 8338-2505 PT Time Calculation (min) (ACUTE ONLY): 29 min  Charges:  $Gait Training: 8-22 mins $Neuromuscular Re-education: 8-22 mins                    Ramond Dial 08/16/2018, 11:38 AM  Mee Hives, PT MS Acute Rehab Dept. Number: [place] [facility] [place]

## 2018-08-16 NOTE — Progress Notes (Addendum)
Occupational Therapy Treatment Patient Details Name: Amanda Saunders MRN: [id number] DOB: 12-26-1955 Today's Date: 08/16/2018    History of present illness Pt is a 63 y.o. female with medical history significant of HTN, prior stroke with residual L sided weakness.  Patient presents to the ED with c/o dizziness, L arm tingling and heaviness. 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   OT comments  Patient progressing slowly.  Continues to require close supervision for transfers and min guard mobility using RW, cueing for safety awareness.  Verbalizes "off feeling" as "disoriented" today, but VSS; has pressure in L side of head, RN aware and has provided medications.  Improving L UE strength, provided shoulder stabilization exercises and theraputty.  Continue to recommend SNF rehab, anticipate she will progress quickly.     Follow Up Recommendations  SNF(may progress to HHOT level)    Equipment Recommendations  3 in 1 bedside commode    Recommendations for Other Services      Precautions / Restrictions Precautions Precautions: Fall Restrictions Weight Bearing Restrictions: No       Mobility Bed Mobility Overal bed mobility: Independent                Transfers Overall transfer level: Needs assistance Equipment used: Rolling walker (2 wheeled) Transfers: Sit to/from Stand Sit to Stand: Supervision         General transfer comment: using RW, for safety    Balance Overall balance assessment: Needs assistance Sitting-balance support: Feet supported Sitting balance-Leahy Scale: Good     Standing balance support: Bilateral upper extremity supported;During functional activity Standing balance-Leahy Scale: Fair Standing balance comment: reliant on UE support                            ADL either performed or assessed with clinical judgement   ADL Overall ADL's : Needs assistance/impaired                          Toilet Transfer: Ambulation;RW;Supervision/safety Toilet Transfer Details (indicate cue type and reason): simulated from arm chair          Functional mobility during ADLs: Min guard;Rolling walker General ADL Comments: limited today by pressure in head on L side, feeling "off"      Vision       Perception     Praxis      Cognition Arousal/Alertness: Awake/alert Behavior During Therapy: WFL for tasks assessed/performed Overall Cognitive Status: Impaired/Different from baseline Area of Impairment: Safety/judgement                         Safety/Judgement: Decreased awareness of safety     General Comments: patient with improving awareness to safety; reports feeling "disoriented" throughout session.  SBT completed: scoring 5/28 questionable impairment with short term memory         Exercises Exercises: General Upper Extremity;Other exercises General Exercises - Upper Extremity Shoulder Flexion: AROM;Left;10 reps;Seated Shoulder Horizontal ABduction: AROM;Left;10 reps;Seated Other Exercises Other Exercises: soft tan theraputty gross grasp, rolling, pinch, lateral pinch and balls    Shoulder Instructions       General Comments VSS: oxygen sat 100, HR 69, BP 137/64    Pertinent Vitals/ Pain       Pain Assessment: Faces Faces Pain Scale: Hurts little more Pain Location: L side of head  Pain Descriptors / Indicators:  Pressure Pain Intervention(s): Limited activity within patient's tolerance;Monitored during session;Premedicated before session;Repositioned(RN aware)  Home Living                                          Prior Functioning/Environment              Frequency  Min 3X/week        Progress Toward Goals  OT Goals(current goals can now be found in the care plan section)  Progress towards OT goals: Progressing toward goals  Acute Rehab OT Goals Patient Stated Goal: to get home   Plan Discharge plan  remains appropriate;Frequency remains appropriate    Co-evaluation                 AM-PAC OT "6 Clicks" Daily Activity     Outcome Measure   Help from another person eating meals?: None Help from another person taking care of personal grooming?: A Little Help from another person toileting, which includes using toliet, bedpan, or urinal?: A Little Help from another person bathing (including washing, rinsing, drying)?: A Little Help from another person to put on and taking off regular upper body clothing?: None Help from another person to put on and taking off regular lower body clothing?: A Little 6 Click Score: 20    End of Session Equipment Utilized During Treatment: Rolling walker  OT Visit Diagnosis: Other abnormalities of gait and mobility (R26.89);Muscle weakness (generalized) (M62.81);Hemiplegia and hemiparesis Hemiplegia - Right/Left: Left Hemiplegia - dominant/non-dominant: Dominant Hemiplegia - caused by: Cerebral infarction   Activity Tolerance Patient tolerated treatment well   Patient Left in bed;with call bell/phone within reach;with bed alarm set   Nurse Communication Mobility status        Time: 3354-5625 OT Time Calculation (min): 26 min  Charges: OT General Charges $OT Visit: 1 Visit OT Treatments $Self Care/Home Management : 8-22 mins $Neuromuscular Re-education: 8-22 mins  Delight Stare, [place] Pager [phone] Office [phone]    Delight Stare 08/16/2018, 2:54 PM

## 2018-08-16 NOTE — Plan of Care (Signed)
  Problem: Education: Goal: Knowledge of secondary prevention will improve Outcome: Progressing   Problem: Education: Goal: Knowledge of patient specific risk factors addressed and post discharge goals established will improve Outcome: Progressing   Problem: Coping: Goal: Will identify appropriate support needs Outcome: Progressing   Problem: Education: Goal: Knowledge of disease or condition will improve Outcome: Progressing

## 2018-08-16 NOTE — Progress Notes (Signed)
PROGRESS NOTE    TAJUANA KNISKERN  DZH:[id number] DOB: Jan 08, 1956 DOA: 08/14/2018 PCP: Darreld Mclean, MD  Brief Narrative: 63 year old female with history of lupus, hypertension, history of prior stroke with mild residual left-sided weakness, cardiomyopathy presented to the ED with left arm tingling, weakness since yesterday morning she presented to [facility] hospital and was transferred to [facility]. -On work-up she was noted to have acute infarcts in the right MCA distribution  Assessment & Plan:   Acute right MCA infarct -With residual left sided paresthesias and weakness, worse from baseline -Neurology consulting -MRA with M1, M2 stenosis or occlusion -She is on aspirin 81 mg at baseline, currently on aspirin 325 mg daily with Plavix and atorvastatin -2D echocardiogram shows EF of 45 to 50%, unchanged from prior -Carotid duplex without significant stenosis -LDL is 77, hemoglobin A1c 6.2 -PT/OT/SLP evaluations completed, SNF recommended -Social work consulted for rehab  History of lupus -Continue hydroxychloroquine  Borderline diabetes -A1c 6.2  Hypertension -Permissive hypertension in the setting of acute CVA   DVT prophylaxis: Lovenox Code Status: Code  family Communication:no Family at bedside, updated son per patient's request Disposition Plan: Pending stroke work-up, anticipate will need rehab  Consultants:   Neurology   Procedures:   Antimicrobials:    Subjective: -Left sided weakness is improving was able to take few steps to the bathroom  Objective: Vitals:   08/16/18 0016 08/16/18 0335 08/16/18 0805 08/16/18 1149  BP: 132/65 (!) 112/53 (!) 136/57 (!) 145/97  Pulse: 76 60 78 74  Resp: 18 18 18 18   Temp: 97.9 F (36.6 C) 98 F (36.7 C) 98.3 F (36.8 C) 98 F (36.7 C)  TempSrc: Oral Oral Oral Oral  SpO2: 97% 98% 100% 100%  Weight:      Height:        Intake/Output Summary (Last 24 hours) at 08/16/2018 1203 Last data filed at 08/16/2018  0900 Gross per 24 hour  Intake 930.81 ml  Output 1 ml  Net 929.81 ml   Filed Weights   08/14/18 2244  Weight: 89.8 kg    Examination:  Gen: Awake, Alert, Oriented X 3, no distress HEENT: PERRLA, Neck supple, no JVD Lungs: Good air movement bilaterally, CTAB CVS: S1-S2/regular rate rhythm Abd: soft, Non tender, non distended, BS present Extremities: No edema Skin: no new rashes Central nervous system: Alert and orientedx3, decreased light touch left upper extremity and left leg  -Improvement in strength in her left leg and left upper extremity, positive pronator drift, motor, 4/5, plantars are withdrawal, DTR was 2+     Data Reviewed:   CBC: Recent Labs  Lab 08/14/18 1323  WBC 7.1  NEUTROABS 5.2  HGB 14.1  HCT 45.1  MCV 86.6  PLT 419   Basic Metabolic Panel: Recent Labs  Lab 08/14/18 1323  NA 141  K 3.8  CL 105  CO2 27  GLUCOSE 90  BUN 13  CREATININE 1.01*  CALCIUM 9.0   GFR: Estimated Creatinine Clearance: 62.2 mL/min (A) (by C-G formula based on SCr of 1.01 mg/dL (H)). Liver Function Tests: Recent Labs  Lab 08/14/18 1323  AST 28  ALT 36  ALKPHOS 74  BILITOT 0.5  PROT 8.4*  ALBUMIN 4.7   No results for input(s): LIPASE, AMYLASE in the last 168 hours. No results for input(s): AMMONIA in the last 168 hours. Coagulation Profile: Recent Labs  Lab 08/14/18 1323  INR 0.98   Cardiac Enzymes: Recent Labs  Lab 08/14/18 1323  TROPONINI <0.03  BNP (last 3 results) No results for input(s): PROBNP in the last 8760 hours. HbA1C: Recent Labs    08/15/18 0451  HGBA1C 6.2*   CBG: Recent Labs  Lab 08/14/18 1330 08/14/18 1351  GLUCAP 86 77   Lipid Profile: Recent Labs    08/15/18 0451  CHOL 136  HDL 38*  LDLCALC 77  TRIG 105  CHOLHDL 3.6   Thyroid Function Tests: No results for input(s): TSH, T4TOTAL, FREET4, T3FREE, THYROIDAB in the last 72 hours. Anemia Panel: No results for input(s): VITAMINB12, FOLATE, FERRITIN, TIBC, IRON,  RETICCTPCT in the last 72 hours. Urine analysis:    Component Value Date/Time   COLORURINE STRAW (A) 08/14/2018 1555   APPEARANCEUR CLEAR 08/14/2018 1555   LABSPEC 1.008 08/14/2018 1555   PHURINE 6.0 08/14/2018 1555   GLUCOSEU NEGATIVE 08/14/2018 1555   HGBUR NEGATIVE 08/14/2018 1555   BILIRUBINUR NEGATIVE 08/14/2018 1555   BILIRUBINUR negative 01/18/2018 0940   KETONESUR NEGATIVE 08/14/2018 1555   PROTEINUR NEGATIVE 08/14/2018 1555   UROBILINOGEN 0.2 01/18/2018 0940   NITRITE NEGATIVE 08/14/2018 1555   LEUKOCYTESUR NEGATIVE 08/14/2018 1555   Sepsis Labs: @LABRCNTIP (procalcitonin:4,lacticidven:4)  )No results found for this or any previous visit (from the past 240 hour(s)).       Radiology Studies: Dg Chest 2 View  Result Date: 08/14/2018 CLINICAL DATA:  Dizziness and left arm tingling. EXAM: CHEST - 2 VIEW COMPARISON:  06/03/2018 FINDINGS: The heart size and mediastinal contours are within normal limits. Both lungs are clear. The visualized skeletal structures are unremarkable. IMPRESSION: No active cardiopulmonary disease. Electronically Signed   By: Ashley Royalty M.D.   On: 08/14/2018 22:05   Mr Brain Wo Contrast (neuro Protocol)  Result Date: 08/14/2018 CLINICAL DATA:  Left upper and lower extremity weakness and visual disturbance. Follow-up stroke. EXAM: MRI HEAD WITHOUT CONTRAST TECHNIQUE: Multiplanar, multiecho pulse sequences of the brain and surrounding structures were obtained without intravenous contrast. COMPARISON:  Head CT earlier same day.  MRI 05/28/2016. FINDINGS: Brain: Scattered foci of acute infarction in the right frontal cortical and subcortical brain consistent with micro embolic infarctions in the right middle cerebral artery territory. No large or confluent infarction. No hemorrhage or mass effect. Elsewhere, brainstem and cerebellum appear normal. Cerebral hemispheres show advanced chronic small-vessel ischemic changes affecting the deep and subcortical white  matter. No large vessel territory infarction. No mass lesion, hemorrhage, hydrocephalus or extra-axial collection. Vascular: Major vessels at the base of the brain show flow. Skull and upper cervical spine: Negative Sinuses/Orbits: Clear/normal Other: None IMPRESSION: Scattered acute subcortical and small cortical infarctions in the right frontal lobe most consistent with micro embolic infarctions in a right MCA branch vessel distribution or branch vessel occlusion. No large confluent infarction. No swelling, mass effect or hemorrhage. Extensive chronic small-vessel ischemic changes elsewhere throughout the cerebral hemispheric white matter. Electronically Signed   By: Nelson Chimes M.D.   On: 08/14/2018 19:43   Mr Jodene Nam Head Wo Contrast  Result Date: 08/15/2018 CLINICAL DATA:  Stroke. EXAM: MRA HEAD WITHOUT CONTRAST TECHNIQUE: Angiographic images of the Circle of Willis were obtained using MRA technique without intravenous contrast. COMPARISON:  Brain MRI 08/14/2018 FINDINGS: POSTERIOR CIRCULATION: --Basilar artery: Normal. --Posterior cerebral arteries: Normal. Both originate from the basilar artery. --Superior cerebellar arteries: Normal. --Inferior cerebellar arteries: There is a shared AICA/PICA origin on the right. Normal left AICA and PICA. ANTERIOR CIRCULATION: --Intracranial internal carotid arteries: Mild bilateral atherosclerotic irregularity without stenosis. --Anterior cerebral arteries: Normal. Both A1 segments are present. Patent anterior communicating  artery. --Middle cerebral arteries: Loss of normal flow related enhancement within the distal right M1 segment and proximal M2 segments. Severe short segment stenosis of the left M2 segment superior division. --Posterior communicating arteries: Present on the left, absent on the right. IMPRESSION: 1. Loss of flow related enhancement within the distal right M1 segment and proximal M2 segments, indicating severe stenosis or short segment occlusion. 2.  Left MCA M2 segment superior division proximal severe stenosis or short segment occlusion. Electronically Signed   By: Ulyses Jarred M.D.   On: 08/15/2018 01:18   Ct Head Code Stroke Wo Contrast  Result Date: 08/14/2018 CLINICAL DATA:  Code stroke. Left upper and lower extremity weakness. Left-sided visual disturbance. EXAM: CT HEAD WITHOUT CONTRAST TECHNIQUE: Contiguous axial images were obtained from the base of the skull through the vertex without intravenous contrast. COMPARISON:  CT and MRI studies 2017. FINDINGS: Brain: The brainstem and cerebellum appear normal. Cerebral hemispheres show chronic appearing low-density throughout the hemispheric white matter consistent with chronic small vessel ischemic change. No CT finding of acute stroke affecting the right hemisphere. No mass lesion, hemorrhage, hydrocephalus or extra-axial collection. Vascular: There is atherosclerotic calcification of the major vessels at the base of the brain. No sign of acute hyperdense vessel. Skull: Negative Sinuses/Orbits: Clear/normal Other: None ASPECTS ([place]man Stroke Program Early CT Score) - Ganglionic level infarction (caudate, lentiform nuclei, internal capsule, insula, M1-M3 cortex): 7 - Supraganglionic infarction (M4-M6 cortex): 3 Total score (0-10 with 10 being normal): 10 IMPRESSION: 1. No acute finding by CT. Chronic small-vessel ischemic changes of the cerebral hemispheric white matter. 2. ASPECTS is 10. * These results were called by telephone at the time of interpretation on 08/14/2018 at 1:36 pm to Dr. Duffy Bruce , who verbally acknowledged these results. Electronically Signed   By: Nelson Chimes M.D.   On: 08/14/2018 13:40   Vas US Carotid (at [place] Only)  Result Date: 08/15/2018 Carotid Arterial Duplex Study Indications:  CVA. Risk Factors: Hypertension. Performing Technologist: Maudry Mayhew MHA, RDMS, RVT, RDCS  Examination Guidelines: A complete evaluation includes B-mode imaging, spectral  Doppler, color Doppler, and power Doppler as needed of all accessible portions of each vessel. Bilateral testing is considered an integral part of a complete examination. Limited examinations for reoccurring indications may be performed as noted.  Right Carotid Findings: +----------+--------+--------+--------+--------+--------+           PSV cm/sEDV cm/sStenosisDescribeComments +----------+--------+--------+--------+--------+--------+ CCA Prox  74      16                               +----------+--------+--------+--------+--------+--------+ CCA Distal44      12                               +----------+--------+--------+--------+--------+--------+ ICA Prox  32      9                                +----------+--------+--------+--------+--------+--------+ ICA Distal38      14                               +----------+--------+--------+--------+--------+--------+ ECA       36      6                                +----------+--------+--------+--------+--------+--------+ +----------+--------+-------+----------------+-------------------+  PSV cm/sEDV cmsDescribe        Arm Pressure (mmHG) +----------+--------+-------+----------------+-------------------+ [id number]            Multiphasic, WNL                    +----------+--------+-------+----------------+-------------------+ +---------+--------+--+--------+--+---------+ VertebralPSV cm/s49EDV cm/s17Antegrade +---------+--------+--+--------+--+---------+  Left Carotid Findings: +----------+--------+--------+--------+-----------------------+--------+           PSV cm/sEDV cm/sStenosisDescribe               Comments +----------+--------+--------+--------+-----------------------+--------+ CCA Prox  106     26              heterogenous and smooth         +----------+--------+--------+--------+-----------------------+--------+ CCA Distal69      21                                               +----------+--------+--------+--------+-----------------------+--------+ ICA Prox  40      12                                              +----------+--------+--------+--------+-----------------------+--------+ ICA Distal62      25                                              +----------+--------+--------+--------+-----------------------+--------+ ECA       61      12                                              +----------+--------+--------+--------+-----------------------+--------+ +----------+--------+--------+----------------+-------------------+ SubclavianPSV cm/sEDV cm/sDescribe        Arm Pressure (mmHG) +----------+--------+--------+----------------+-------------------+           103             Multiphasic, WNL                    +----------+--------+--------+----------------+-------------------+ +---------+--------+--+--------+--+---------+ VertebralPSV cm/s69EDV cm/s18Antegrade +---------+--------+--+--------+--+---------+  Summary: Right Carotid: Velocities in the right ICA are consistent with a 1-39% stenosis. Left Carotid: Velocities in the left ICA are consistent with a 1-39% stenosis. Vertebrals:  Bilateral vertebral arteries demonstrate antegrade flow. Subclavians: Normal flow hemodynamics were seen in bilateral subclavian              arteries. *See table(s) above for measurements and observations.     Preliminary         Scheduled Meds: . aspirin  300 mg Rectal Daily   Or  . aspirin  325 mg Oral Daily  . atorvastatin  40 mg Oral Daily  . clopidogrel  75 mg Oral Daily  . enoxaparin (LOVENOX) injection  40 mg Subcutaneous Q24H  . hydroxychloroquine  200 mg Oral BID  . sodium chloride flush  3 mL Intravenous Once   Continuous Infusions:    LOS: 2 days    Time spent: 37min    Domenic Polite, MD [facility]  08/16/2018, 12:03 PM

## 2018-08-16 NOTE — Plan of Care (Signed)
  Problem: Education: Goal: Knowledge of patient specific risk factors addressed and post discharge goals established will improve 08/16/2018 0021 by Marcos Eke, RN Outcome: Progressing 08/16/2018 0020 by Marcos Eke, RN Outcome: Progressing

## 2018-08-17 MED ORDER — ASPIRIN 325 MG PO TABS: 325.0000 mg | ORAL_TABLET | Freq: Every day | ORAL | Status: DC

## 2018-08-17 MED ORDER — ACETAMINOPHEN 325 MG PO TABS: 650.0000 mg | ORAL_TABLET | ORAL | Status: DC | PRN

## 2018-08-17 MED ORDER — CLOPIDOGREL BISULFATE 75 MG PO TABS: 75.0000 mg | ORAL_TABLET | Freq: Every day | ORAL | 1 refills | Status: DC

## 2018-08-17 NOTE — Progress Notes (Signed)
Physical Therapy Treatment Patient Details Name: Amanda Saunders MRN: [id number] DOB: 1956-01-22 Today's Date: 08/17/2018    History of Present Illness Pt is a 63 y.o. female admitted 08/14/18 with LUE tingling and heaviness; did not receive tPA due to late presentation. MRI shows scattered acute subcortical infarcts in R frontal lobe most consistent with microembolic infarcts in R MCA brance vessel. PMH includes HTN, vertigo, cardiomyopathy, previous stroke (residual L-side weakness).   PT Comments    Pt progressing well with mobility. Initiated gait training with use of rollator this session. Pt using this well at supervision-level. Able to ascend/descend steps with HHA and min guard. Pt will have appropriate support/assist from family upon discharge. Motivated to participate with [facility] services.    Follow Up Recommendations  Home health PT;Supervision for mobility/OOB     Equipment Recommendations  3in1 (PT)(rollator)    Recommendations for Other Services       Precautions / Restrictions Precautions Precautions: Fall Restrictions Weight Bearing Restrictions: No    Mobility  Bed Mobility Overal bed mobility: Independent                Transfers Overall transfer level: Modified independent Equipment used: 4-wheeled walker             General transfer comment: Good technique sit<>stand with rollator multiple times; intermittent cues for locking/unlocking brakes, but pt doing this consistently by end of session  Ambulation/Gait Ambulation/Gait assistance: Supervision Gait Distance (Feet): 400 Feet Assistive device: 4-wheeled walker Gait Pattern/deviations: Step-through pattern;Decreased stride length Gait velocity: Decreased Gait velocity interpretation: <1.8 ft/sec, indicate of risk for recurrent falls General Gait Details: Slow, mostly steady gait with rollator; supervision for safety. Able to increase speed with cues, but quick to return to significantly  slowed gait speed   Stairs Stairs: Yes Stairs assistance: Min guard Stair Management: One rail Right;Step to pattern Number of Stairs: 2 General stair comments: Ascended/descended steps initially with biltaral rail support, progressing to HHA and single rail support since pt does not have rails at home. Educ on having 1-2 family for HHA; pt understands   Wheelchair Mobility    Modified Rankin (Stroke Patients Only) Modified Rankin (Stroke Patients Only) Pre-Morbid Rankin Score: No symptoms Modified Rankin: Moderately severe disability     Balance Overall balance assessment: Needs assistance Sitting-balance support: Feet supported Sitting balance-Leahy Scale: Good     Standing balance support: Bilateral upper extremity supported;During functional activity Standing balance-Leahy Scale: Fair Standing balance comment: Can static stand and take steps without UE support; dynamic stbility improved with UE support                            Cognition Arousal/Alertness: Awake/alert Behavior During Therapy: WFL for tasks assessed/performed Overall Cognitive Status: Within Functional Limits for tasks assessed                                        Exercises      General Comments        Pertinent Vitals/Pain Pain Assessment: Faces Faces Pain Scale: Hurts a little bit Pain Location: Headache Pain Descriptors / Indicators: Headache Pain Intervention(s): Monitored during session    Home Living                      Prior Function  PT Goals (current goals can now be found in the care plan section) Acute Rehab PT Goals Patient Stated Goal: to get home  PT Goal Formulation: With patient Time For Goal Achievement: 08/29/18 Potential to Achieve Goals: Good Progress towards PT goals: Progressing toward goals    Frequency    Min 4X/week      PT Plan Discharge plan needs to be updated    Co-evaluation               AM-PAC PT "6 Clicks" Mobility   Outcome Measure  Help needed turning from your back to your side while in a flat bed without using bedrails?: None Help needed moving from lying on your back to sitting on the side of a flat bed without using bedrails?: None Help needed moving to and from a bed to a chair (including a wheelchair)?: None Help needed standing up from a chair using your arms (e.g., wheelchair or bedside chair)?: A Little Help needed to walk in hospital room?: A Little Help needed climbing 3-5 steps with a railing? : A Little 6 Click Score: 21    End of Session Equipment Utilized During Treatment: Gait belt Activity Tolerance: Patient tolerated treatment well Patient left: in bed;with call bell/phone within reach;with bed alarm set Nurse Communication: Mobility status PT Visit Diagnosis: Hemiplegia and hemiparesis;Unsteadiness on feet (R26.81);Muscle weakness (generalized) (M62.81) Hemiplegia - Right/Left: Left Hemiplegia - dominant/non-dominant: Non-dominant Hemiplegia - caused by: Cerebral infarction     Time: 2800-3491 PT Time Calculation (min) (ACUTE ONLY): 21 min  Charges:  $Gait Training: 8-22 mins                    Mabeline Caras, PT, DPT Acute Rehabilitation Services  Pager [phone] Office [place] 08/17/2018, 2:00 PM

## 2018-08-17 NOTE — Consult Note (Signed)
   [facility] CM Inpatient Consult   08/17/2018  VALINCIA TOUCH 07-26-1955 [id number]   Went by to see Ms. Buttrey to discuss ongoing [place] Management services. She is showing up as "active" with [place] Management program. She was recently outreached by [facility] Pharmacist.  Discussed [place] Management program services. She is agreeable and [facility] written consent obtained. Patient now has [place] not HTA according insurance card under media tab in chart and patient's confirmation. Cross checked [facility] card number for [facility] Care Management eligibility.  Ms. Rigsbee has a history of HTN, cardiomyopathy, and previous stroke. She is currently hospitalized for CVA and slated for discharge home today with home health.   She reports she lives with her kids and grandkids. States transportation is a concern to MD appointments. Denies concerns with medication.  Agreeable to [place] follow up and [facility] Social Worker for transportation needs.   Confirmed Primary Care Provider is Dr. Lorelei Pont. [place] is listed as doing transition of care call post discharge.  Confirmed best contact number for Ms. Carmicheal is [phone].Marland Kitchen  Explained to Ms. Carmicheal that [place] Management will not replace or interfere services provided by home health.   Spoke with inpatient RNCM to make aware [place] Management will follow post hospital discharge.   Marthenia Rolling, MSN-Ed, RN,BSN [facility] Liaison [phone]

## 2018-08-17 NOTE — Care Management Note (Addendum)
Case Management Note  Patient Details  Name: Amanda Saunders MRN: [id number] Date of Birth: 1956-01-27  Subjective/Objective:        Pt admitted with a stroke. She is from home with her daughter. Pt states her grandson is with her when her daughter is at work. DME: none No issues with medications. Pt reliant on family for transportation.            Action/Plan: Pt with orders for [facility] services. CM provided choice yesterday and she selected [facility]. Dorian Pod with [facility] notified and accepted the referral.  Pt with orders for rollator and 3 in 1. Jermaine with [facility] DME notified and will deliver to the room. Pt has transportation home.   Pt provided information on SCAT with the application.   Expected Discharge Date:  08/17/18               Expected Discharge Plan:  [place]  In-House Referral:     Discharge planning Services  CM Consult  Post Acute Care Choice:  Durable Medical Equipment, Home Health Choice offered to:  Patient  DME Arranged:  3-N-1, Walker rolling with seat DME Agency:  [place]:  PT, OT [facility] Agency:  [facility]  Status of Service:  Completed, signed off  If discussed at [place] of Stay Meetings, dates discussed:    Additional Comments:  Pollie Friar, RN 08/17/2018, 2:44 PM

## 2018-08-17 NOTE — Progress Notes (Signed)
Pt given discharge instructions and paperwork, able to verbalize understanding and teach back. No new questions or concerns. IV and telemetry d/c.   Pt waiting on family to transport home.

## 2018-08-17 NOTE — Progress Notes (Signed)
SLP Cancellation Note  Patient Details Name: Amanda Saunders MRN: [id number] DOB: October 26, 1955   Cancelled treatment:       Reason Eval/Treat Not Completed: Other (comment) Pt was approached for therapy but she reported that she has already been discharged and her son is already on his way up to the floor to get her. It was agreed that SLP tx would be deferred at this time.    Horton Marshall 08/17/2018, 5:56 PM

## 2018-08-18 ENCOUNTER — Other Ambulatory Visit: Payer: Self-pay

## 2018-08-18 NOTE — Patient Outreach (Signed)
[place] [facility]) Care Management  08/18/2018  CHINENYE KATZENBERGER Jan 31, 1956 [id number]  BSW attempted to contact the patient on today's date to conduct a community resource consult. Unfortunately, today's call was unsuccessful. BSW left the patient a HIPAA compliant voice message requesting a return call.   Plan: BSW will mail the patient an unsuccessful outreach letter. BSW will attempt the patient again within the next four business days.  Daneen Schick, BSW, CDP [facility] [phone]

## 2018-08-19 ENCOUNTER — Other Ambulatory Visit: Payer: Self-pay | Admitting: *Deleted

## 2018-08-19 NOTE — Patient Outreach (Signed)
[place] [facility]) Care Management  08/19/2018  Amanda Saunders 1956/05/20 [id number]   RN received a referral on 1/28  RN attempted the initial outreach call today that was unsuccessful. RN able to leave a HIPAA approved voice message requesting a call back. Will further engage at that time. Will rescheduled another outreach call according for pending services.  Note primary provider to completed transition of care call.  Raina Mina, RN Care Management Coordinator [place] Office [phone]

## 2018-08-20 DIAGNOSIS — I69354 Hemiplegia and hemiparesis following cerebral infarction affecting left non-dominant side: Secondary | ICD-10-CM | POA: Diagnosis not present

## 2018-08-20 DIAGNOSIS — I10 Essential (primary) hypertension: Secondary | ICD-10-CM | POA: Diagnosis not present

## 2018-08-20 DIAGNOSIS — Z87891 Personal history of nicotine dependence: Secondary | ICD-10-CM | POA: Diagnosis not present

## 2018-08-20 DIAGNOSIS — Z9181 History of falling: Secondary | ICD-10-CM | POA: Diagnosis not present

## 2018-08-23 ENCOUNTER — Telehealth: Payer: Self-pay | Admitting: Family Medicine

## 2018-08-23 NOTE — Telephone Encounter (Signed)
Copied from [place]. Topic: Quick Communication - Home Health Verbal Orders >> Aug 23, 2018  9:44 AM Jodie Echevaria wrote: Caller/Agency: Wilhemena Durie / [facility] Home health Callback Number: [phone] ok to LM  Requesting OT/PT/Skilled Nursing/Social Work: PT  Frequency: 1 time week 1 week, 2 xs week for three week, 1 time week for 1 wk

## 2018-08-24 ENCOUNTER — Other Ambulatory Visit: Payer: Self-pay | Admitting: *Deleted

## 2018-08-24 ENCOUNTER — Other Ambulatory Visit: Payer: Self-pay

## 2018-08-24 DIAGNOSIS — Z87891 Personal history of nicotine dependence: Secondary | ICD-10-CM | POA: Diagnosis not present

## 2018-08-24 DIAGNOSIS — I69354 Hemiplegia and hemiparesis following cerebral infarction affecting left non-dominant side: Secondary | ICD-10-CM | POA: Diagnosis not present

## 2018-08-24 DIAGNOSIS — Z9181 History of falling: Secondary | ICD-10-CM | POA: Diagnosis not present

## 2018-08-24 DIAGNOSIS — I10 Essential (primary) hypertension: Secondary | ICD-10-CM | POA: Diagnosis not present

## 2018-08-24 NOTE — Patient Outreach (Signed)
[place] [facility]) Care Management  08/24/2018  SHAVY BEACHEM [birth year] [id number]  Successful outreach to the patient on today's date to assist with transportation resources. HIPAA identifiers confirmed. The patient confirms her current Medicare payor to be [facility]). BSW educated the patient on her transportation benefit provided by [employer] under her [facility] plan. BSW inquired if the patient had any immediate transportation needs. The patient reports she has an upcomming appointment on Thursday that her daughter plans to assist with. BSW discussed transportation benefit and the number of ride she is permitted to access each year. BSW attempted to discuss alternate transportation resources with the patient in the event she has needs greater than what her [employer] benefit allows. The patient abruptly cut BSW off stating "just send it and that's it".  Plan: BSW to mail the patient information on how to acces her transportation benefit. BSW to outreach the patient in the next three week to confirm receipt of mailing at which time BSW will plan to close case.  Daneen Schick, BSW, CDP [facility] [phone]

## 2018-08-24 NOTE — Patient Outreach (Signed)
[place] [facility]) Care Management  08/24/2018  AAIRAH NEGRETTE April 15, 1956 [id number]  Unsuccessful   RN spoke briefly with pt as RN introduced name and agency. Pt requested a call back indicating she was on another line. RN offered to call back later today (pt receptive).  Raina Mina, RN Care Management Coordinator [place] Office [phone]

## 2018-08-24 NOTE — Progress Notes (Deleted)
NEUROLOGY FOLLOW UP OFFICE NOTE  Amanda Saunders [id number]  HISTORY OF PRESENT ILLNESS: Amanda Saunders is a 63 year old left-handed woman whom I previously saw for stroke in 2017 who follows up after another recent stroke.  History supplemented by hospital notes.  CT and MRI from recent hospitalization personally reviewed.  UPDATE: Amanda Saunders was last seen in 2017.  She was recently admitted to [facility] on 08/14/18 for another stroke.  She presented with dizziness, nausea, vomiting, headache and tremor, tingling and heavy sensation of her left arm.  CT of head showed no acute findings.  She did not receive tPA due to late presentation.  MRI of brain showed scattered acute subcortical and small cortical infarcts in the right frontal lobe.  MRA of head showed severe stenosis or short segment occlusion within the distal right M1 and proximal M2 segements.  Proximal superior division of left MCA M2 segment also showed severe stenosis or short segment occlusion.  Carotid doppler revealed no hemodynamically significant stenosis.  2D echo showed EF 45-50%.  LDL was 77.  Hgb A1c was 6.2.  Previous ASA 81mg  daily was changed to ASA 325mg  and Plavix 75mg  daily.  She was continued on Lipitor 40mg  daily.  HISTORY: She had a cerebrovascular event when she was living in [place] in 2015.  She said she presented with right facial droop and left arm numbness with nausea.  She said a stroke was verified on brain imaging and she was started on ASA.  She was not started on a statin.  She was also told she had cardiomyopathy.  Sometimes, she would skip doses of her ASA.  Since the stroke, she reports: 1.  Headaches:  Bifrontal, nonthrobbing, sometimes with nausea, and occurs twice a week.  They respond to Tylenol or Motrin. 2.  Memory problems.  She forgets conversations.  She has become disoriented once when driving on familiar route.   It is a concern for some family members  She moved  to [place] in May.  She presented to the hospital in August for acute onset of spinning sensation and feeling of nausea.  CT of head revealed no acute findings.  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  She continues to have a constant feeling of wooziness.  She presented to [facility] on 03/26/16 with bilateral arm and left leg numbness and chest pain.  EKG and troponins were negative for ACS.  Head CT was negative for acute findings.  She was transferred to [facility] for MRI and further evaluation.  MRI of brain was personally reviewed and revealed advanced chronic small vessel ischemic changes but no acute infarct.  The numbness improved and at time of discharge, she only reported slight residual numbness on tip of her thumb.    She reportedly was told she had cardiomyopathy in [place].  She followed up with cardiology due to this possible past medical history, as well as atypical chest pain and dyspnea. EKG showed sinus rhythm, LVH and nonspecific T wave changes.  She underwent an Exercise Stress Test on 04/22/16 which was stopped due to fatigue and shortness of breath.  Blood pressure and heart rate demonstrated normal response and no significant arrhythmias were noted.  ECG was uninterpretable due to resting ST abnormalities.  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  A cardiac MRI was ordered.  PAST MEDICAL HISTORY: Past Medical  History:  Diagnosis Date  . Cardiomyopathy ([place])   . Hypertension   . PUD (peptic ulcer disease)   . Stroke ([place])   . Vertigo     MEDICATIONS: Current Outpatient Medications on File Prior to Visit  Medication Sig Dispense Refill  . acetaminophen (TYLENOL) 325 MG tablet Take 2 tablets (650 mg total) by mouth every 4 (four) hours as needed for mild pain (or temp > 37.5 C (99.5 F)).    Marland Kitchen aspirin 325 MG tablet Take 1 tablet (325 mg total) by mouth daily.      Marland Kitchen atorvastatin (LIPITOR) 10 MG tablet Take 1 tablet (10 mg total) by mouth daily. 90 tablet 3  . clopidogrel (PLAVIX) 75 MG tablet Take 1 tablet (75 mg total) by mouth daily. 30 tablet 1  . hydrochlorothiazide (HYDRODIURIL) 25 MG tablet Take 1 tablet (25 mg total) by mouth every other day. 45 tablet 3  . hydroxychloroquine (PLAQUENIL) 200 MG tablet Take 200 mg by mouth 2 (two) times daily.    . meclizine (ANTIVERT) 25 MG tablet Take 1 tablet (25 mg total) by mouth 2 (two) times daily as needed for dizziness. 30 tablet 2  . metoprolol succinate (TOPROL-XL) 25 MG 24 hr tablet Take 1 tablet (25 mg total) by mouth daily. 90 tablet 3   No current facility-administered medications on file prior to visit.     ALLERGIES: Allergies  Allergen Reactions  . Codeine Nausea And Vomiting    FAMILY HISTORY: Family History  Problem Relation Age of Onset  . Hypertension Mother   . Arthritis Mother   . Heart failure Mother   . Stroke Mother   . Kidney disease Father   . Hypertension Father    ***.  SOCIAL HISTORY: Social History   Socioeconomic History  . Marital status: Single    Spouse name: Not on file  . Number of children: 1  . Years of education: Not on file  . Highest education level: Not on file  Occupational History    Comment: retired  Scientific laboratory technician  . Financial resource strain: Not on file  . Food insecurity:    Worry: Not on file    Inability: Not on file  . Transportation needs:    Medical: Not on file    Non-medical: Not on file  Tobacco Use  . Smoking status: Former Smoker    Last attempt to quit: 07/21/2002    Years since quitting: 16.1  . Smokeless tobacco: Never Used  Substance and Sexual Activity  . Alcohol use: Yes    Comment: occasional  . Drug use: No  . Sexual activity: Not on file  Lifestyle  . Physical activity:    Days per week: Not on file    Minutes per session: Not on file  . Stress: Not on file  Relationships  . Social connections:    Talks on  phone: Not on file    Gets together: Not on file    Attends religious service: Not on file    Active member of club or organization: Not on file    Attends meetings of clubs or organizations: Not on file    Relationship status: Not on file  . Intimate partner violence:    Fear of current or ex partner: Not on file    Emotionally abused: Not on file    Physically abused: Not on file    Forced sexual activity: Not on file  Other Topics Concern  . Not on file  Social History Narrative  . Not on file    REVIEW OF SYSTEMS: Constitutional: No fevers, chills, or sweats, no generalized fatigue, change in appetite Eyes: No visual changes, double vision, eye pain Ear, nose and throat: No hearing loss, ear pain, nasal congestion, sore throat Cardiovascular: No chest pain, palpitations Respiratory:  No shortness of breath at rest or with exertion, wheezes GastrointestinaI: No nausea, vomiting, diarrhea, abdominal pain, fecal incontinence Genitourinary:  No dysuria, urinary retention or frequency Musculoskeletal:  No neck pain, back pain Integumentary: No rash, pruritus, skin lesions Neurological: as above Psychiatric: No depression, insomnia, anxiety Endocrine: No palpitations, fatigue, diaphoresis, mood swings, change in appetite, change in weight, increased thirst Hematologic/Lymphatic:  No purpura, petechiae. Allergic/Immunologic: no itchy/runny eyes, nasal congestion, recent allergic reactions, rashes  PHYSICAL EXAM: *** General: No acute distress.  Patient appears ***-groomed.  *** body habitus. Head:  Normocephalic/atraumatic Eyes:  Fundi examined but not visualized Neck: supple, no paraspinal tenderness, full range of motion Heart:  Regular rate and rhythm Lungs:  Clear to auscultation bilaterally Back: No paraspinal tenderness Neurological Exam: alert and oriented to person, place, and time. Attention span and concentration intact, recent and remote memory intact, fund of  knowledge intact.  Speech fluent and not dysarthric, language intact.  CN II-XII intact. Bulk and tone normal, muscle strength 5/5 throughout.  Sensation to light touch, temperature and vibration intact.  Deep tendon reflexes 2+ throughout, toes downgoing.  Finger to nose and heel to shin testing intact.  Gait normal, Romberg negative.  IMPRESSION: 1.  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 2.  Hypertension 3.  Hyperlipidemia 4.  Type 2 diabetes mellitus  PLAN: 1.  Continue ASA 325mg  and Plavix 75mg  daily for 3 months, then Plavix 75mg  daily alone for secondary stroke prevention. 2.  Continue Lipitor 40mg  daily (LDL goal less than 70) 3.  Continue glycemic control  Metta Clines, DO  CC: ***

## 2018-08-24 NOTE — Patient Outreach (Signed)
[place] [facility]) Care Management  08/24/2018  Amanda Saunders 05/05/1956 [id number]    RN spoke with pt today and introduced the [facility] services and purpose for today's call. Discussed her recent hospitalization and inquired further on any needs. Pt states she has [facility] involved and a supportive family. Pt verified her medications and appointments with sufficient transportation. Pt denies any needs at this time and declined services for [facility] involvement.. RN provided contact name and number if pt has any questions or needs that may arise. Pt grateful and aware her provider will be notified of her disposition and decline for [facility] services. No further inquires as team will be updated.  Transition of care to be completed by the provider's office.  Raina Mina, RN Care Management Coordinator [place] Office [phone]

## 2018-08-25 NOTE — Telephone Encounter (Signed)
Number unavailable. Rang several times and hung up.

## 2018-08-26 ENCOUNTER — Ambulatory Visit: Payer: Medicare Other | Admitting: Neurology

## 2018-08-27 NOTE — Telephone Encounter (Signed)
Verbal orders given. Ana called back.

## 2018-08-27 NOTE — Discharge Summary (Signed)
Physician Discharge Summary  Amanda Saunders IOE:[id number] DOB: August 12, 1955 DOA: 08/14/2018  PCP: Amanda Mclean, MD  Admit date: 08/14/2018 Discharge date: 08/17/2018  Time spent: 35 minutes  Recommendations for Outpatient Follow-up:  1. PCP in 1 week 2.   Neurology Dr. Erlinda Hong in 4 weeks Home health PT OT  Discharge Diagnoses:  Principal Problem:   Acute ischemic stroke [facility]) Active Problems:   HTN (hypertension) History of SLE Borderline diabetes  Discharge Condition: Stable  Diet recommendation: Heart healthy, carb modified  Filed Weights   08/14/18 2244  Weight: 89.8 kg    History of present illness:  63 year old female with history of lupus, hypertension, history of prior stroke with mild residual left-sided weakness, cardiomyopathy presented to the ED with left arm tingling, weakness   Hospital Course:   Acute right MCA infarct -With residual left sided paresthesias and weakness, worse from baseline -Neurology consulted -MRA with M1, M2 stenosis or occlusion -She is on aspirin 81 mg at baseline, currently on aspirin 325 mg daily with Plavix for 3 months followed by Plavix alone, also continue atorvastatin -2D echocardiogram shows EF of 45 to 50%, unchanged from prior -Carotid duplex without significant stenosis -LDL is 77, hemoglobin A1c 6.2 -PT/OT/SLP evaluations completed, originally SNF was recommended however she had good clinical improvement and hence discharged home with home health PT, OT  History of lupus -Continue hydroxychloroquine  Borderline diabetes -A1c 6.2 -Lifestyle modification advised  Hypertension -Permissive hypertension in the setting of acute CVA, initially antihypertensives held and then Toprol and HCTZ were resumed   Discharge Exam: Vitals:   08/17/18 1229 08/17/18 1551  BP: 135/66 139/87  Pulse: 67 67  Resp: 19 20  Temp: 98.1 F (36.7 C) 98.5 F (36.9 C)  SpO2: 97% 100%    General: AAOx3 Cardiovascular:  S1S2/RRR Respiratory: CTAB  Discharge Instructions   Discharge Instructions    AMB Referral to [facility] Management   Complete by:  As directed    Please assign to [place] for complex case management and [facility] Social Worker for transportation assistance. Written consent obtained. To discharge home today 08/17/18 with home health. PCP office [facility]) listed as doing toc. Please call with questions. Thanks. Marthenia Rolling, [place], RN,BSN-[facility] HWEXHBZ-[phone]   Reason for consult:  Please assign to [name]   Diagnoses of:   Stroke: Ischemic/TIA Other     Other Diagnosis:  HTN   Expected date of contact:  1-3 days (reserved for hospital discharges)   Ambulatory referral to Neurology   Complete by:  As directed    An appointment is requested in approximately: 4 weeks   Diet - low sodium heart healthy   Complete by:  As directed    Increase activity slowly   Complete by:  As directed      Allergies as of 08/17/2018      Reactions   Codeine Nausea And Vomiting      Medication List    STOP taking these medications   naproxen sodium 550 MG tablet Commonly known as:  ANAPROX     TAKE these medications   acetaminophen 325 MG tablet Commonly known as:  TYLENOL Take 2 tablets (650 mg total) by mouth every 4 (four) hours as needed for mild pain (or temp > 37.5 C (99.5 F)).   aspirin 325 MG tablet Take 1 tablet (325 mg total) by mouth daily. What changed:    how much to take  Another medication with the same name was removed. Continue taking this medication, and follow the directions you see here.   atorvastatin 10 MG tablet Commonly known as:  LIPITOR Take 1 tablet (10 mg total) by mouth daily.   clopidogrel 75 MG tablet Commonly known as:  PLAVIX Take 1 tablet (75 mg total) by mouth daily.   hydrochlorothiazide 25 MG tablet Commonly known as:  HYDRODIURIL Take 1 tablet (25 mg total) by mouth every other  day.   hydroxychloroquine 200 MG tablet Commonly known as:  PLAQUENIL Take 200 mg by mouth 2 (two) times daily.   meclizine 25 MG tablet Commonly known as:  ANTIVERT Take 1 tablet (25 mg total) by mouth 2 (two) times daily as needed for dizziness.   metoprolol succinate 25 MG 24 hr tablet Commonly known as:  TOPROL-XL Take 1 tablet (25 mg total) by mouth daily.      Allergies  Allergen Reactions  . Codeine Nausea And Vomiting   Follow-up Information    Pieter Partridge, DO. Schedule an appointment as soon as possible for a visit in 4 week(s).   Specialty:  Neurology Contact information: [place] [id number] [phone]        [facility] Follow up.   Why:  They will contact you for the first visit. Contact information: [phone]           The results of significant diagnostics from this hospitalization (including imaging, microbiology, ancillary and laboratory) are listed below for reference.    Significant Diagnostic Studies: Dg Chest 2 View  Result Date: 08/14/2018 CLINICAL DATA:  Dizziness and left arm tingling. EXAM: CHEST - 2 VIEW COMPARISON:  06/03/2018 FINDINGS: The heart size and mediastinal contours are within normal limits. Both lungs are clear. The visualized skeletal structures are unremarkable. IMPRESSION: No active cardiopulmonary disease. Electronically Signed   By: Ashley Royalty M.D.   On: 08/14/2018 22:05   Mr Brain Wo Contrast (neuro Protocol)  Result Date: 08/14/2018 CLINICAL DATA:  Left upper and lower extremity weakness and visual disturbance. Follow-up stroke. EXAM: MRI HEAD WITHOUT CONTRAST TECHNIQUE: Multiplanar, multiecho pulse sequences of the brain and surrounding structures were obtained without intravenous contrast. COMPARISON:  Head CT earlier same day.  MRI 05/28/2016. FINDINGS: Brain: Scattered foci of acute infarction in the right frontal cortical and subcortical brain consistent with micro  embolic infarctions in the right middle cerebral artery territory. No large or confluent infarction. No hemorrhage or mass effect. Elsewhere, brainstem and cerebellum appear normal. Cerebral hemispheres show advanced chronic small-vessel ischemic changes affecting the deep and subcortical white matter. No large vessel territory infarction. No mass lesion, hemorrhage, hydrocephalus or extra-axial collection. Vascular: Major vessels at the base of the brain show flow. Skull and upper cervical spine: Negative Sinuses/Orbits: Clear/normal Other: None IMPRESSION: Scattered acute subcortical and small cortical infarctions in the right frontal lobe most consistent with micro embolic infarctions in a right MCA branch vessel distribution or branch vessel occlusion. No large confluent infarction. No swelling, mass effect or hemorrhage. Extensive chronic small-vessel ischemic changes elsewhere throughout the cerebral hemispheric white matter. Electronically Signed   By: Nelson Chimes M.D.   On: 08/14/2018 19:43   Mr Jodene Nam Head Wo Contrast  Result Date: 08/15/2018 CLINICAL DATA:  Stroke. EXAM: MRA HEAD WITHOUT CONTRAST TECHNIQUE: Angiographic images of the Circle of Willis were obtained using MRA technique without intravenous contrast. COMPARISON:  Brain MRI 08/14/2018 FINDINGS: POSTERIOR CIRCULATION: --Basilar artery: Normal. --Posterior  cerebral arteries: Normal. Both originate from the basilar artery. --Superior cerebellar arteries: Normal. --Inferior cerebellar arteries: There is a shared AICA/PICA origin on the right. Normal left AICA and PICA. ANTERIOR CIRCULATION: --Intracranial internal carotid arteries: Mild bilateral atherosclerotic irregularity without stenosis. --Anterior cerebral arteries: Normal. Both A1 segments are present. Patent anterior communicating artery. --Middle cerebral arteries: Loss of normal flow related enhancement within the distal right M1 segment and proximal M2 segments. Severe short segment  stenosis of the left M2 segment superior division. --Posterior communicating arteries: Present on the left, absent on the right. IMPRESSION: 1. Loss of flow related enhancement within the distal right M1 segment and proximal M2 segments, indicating severe stenosis or short segment occlusion. 2. Left MCA M2 segment superior division proximal severe stenosis or short segment occlusion. Electronically Signed   By: Ulyses Jarred M.D.   On: 08/15/2018 01:18   Ct Head Code Stroke Wo Contrast  Result Date: 08/14/2018 CLINICAL DATA:  Code stroke. Left upper and lower extremity weakness. Left-sided visual disturbance. EXAM: CT HEAD WITHOUT CONTRAST TECHNIQUE: Contiguous axial images were obtained from the base of the skull through the vertex without intravenous contrast. COMPARISON:  CT and MRI studies 2017. FINDINGS: Brain: The brainstem and cerebellum appear normal. Cerebral hemispheres show chronic appearing low-density throughout the hemispheric white matter consistent with chronic small vessel ischemic change. No CT finding of acute stroke affecting the right hemisphere. No mass lesion, hemorrhage, hydrocephalus or extra-axial collection. Vascular: There is atherosclerotic calcification of the major vessels at the base of the brain. No sign of acute hyperdense vessel. Skull: Negative Sinuses/Orbits: Clear/normal Other: None ASPECTS ([place] Stroke Program Early CT Score) - Ganglionic level infarction (caudate, lentiform nuclei, internal capsule, insula, M1-M3 cortex): 7 - Supraganglionic infarction (M4-M6 cortex): 3 Total score (0-10 with 10 being normal): 10 IMPRESSION: 1. No acute finding by CT. Chronic small-vessel ischemic changes of the cerebral hemispheric white matter. 2. ASPECTS is 10. * These results were called by telephone at the time of interpretation on 08/14/2018 at 1:36 pm to Dr. Duffy Bruce , who verbally acknowledged these results. Electronically Signed   By: Nelson Chimes M.D.   On: 08/14/2018  13:40   Vas US Carotid (at [place] Only)  Result Date: 08/16/2018 Carotid Arterial Duplex Study Indications:  CVA. Risk Factors: Hypertension. Performing Technologist: Maudry Mayhew MHA, RDMS, RVT, RDCS  Examination Guidelines: A complete evaluation includes B-mode imaging, spectral Doppler, color Doppler, and power Doppler as needed of all accessible portions of each vessel. Bilateral testing is considered an integral part of a complete examination. Limited examinations for reoccurring indications may be performed as noted.  Right Carotid Findings: +----------+--------+--------+--------+--------+--------+           PSV cm/sEDV cm/sStenosisDescribeComments +----------+--------+--------+--------+--------+--------+ CCA Prox  74      16                               +----------+--------+--------+--------+--------+--------+ CCA Distal44      12                               +----------+--------+--------+--------+--------+--------+ ICA Prox  32      9                                +----------+--------+--------+--------+--------+--------+ ICA Distal38      14                               +----------+--------+--------+--------+--------+--------+  ECA       36      6                                +----------+--------+--------+--------+--------+--------+ +----------+--------+-------+----------------+-------------------+           PSV cm/sEDV cmsDescribe        Arm Pressure (mmHG) +----------+--------+-------+----------------+-------------------+ [id number]            Multiphasic, WNL                    +----------+--------+-------+----------------+-------------------+ +---------+--------+--+--------+--+---------+ VertebralPSV cm/s49EDV cm/s17Antegrade +---------+--------+--+--------+--+---------+  Left Carotid Findings: +----------+--------+--------+--------+-----------------------+--------+           PSV cm/sEDV cm/sStenosisDescribe                Comments +----------+--------+--------+--------+-----------------------+--------+ CCA Prox  106     26              heterogenous and smooth         +----------+--------+--------+--------+-----------------------+--------+ CCA Distal69      21                                              +----------+--------+--------+--------+-----------------------+--------+ ICA Prox  40      12                                              +----------+--------+--------+--------+-----------------------+--------+ ICA Distal62      25                                              +----------+--------+--------+--------+-----------------------+--------+ ECA       61      12                                              +----------+--------+--------+--------+-----------------------+--------+ +----------+--------+--------+----------------+-------------------+ SubclavianPSV cm/sEDV cm/sDescribe        Arm Pressure (mmHG) +----------+--------+--------+----------------+-------------------+           103             Multiphasic, WNL                    +----------+--------+--------+----------------+-------------------+ +---------+--------+--+--------+--+---------+ VertebralPSV cm/s69EDV cm/s18Antegrade +---------+--------+--+--------+--+---------+  Summary: Right Carotid: Velocities in the right ICA are consistent with a 1-39% stenosis. Left Carotid: Velocities in the left ICA are consistent with a 1-39% stenosis. Vertebrals:  Bilateral vertebral arteries demonstrate antegrade flow. Subclavians: Normal flow hemodynamics were seen in bilateral subclavian              arteries. *See table(s) above for measurements and observations.  Electronically signed by Antony Contras MD on 08/16/2018 at 3:42:32 PM.    Final     Microbiology: No results found for this or any previous visit (from the past 240 hour(s)).   Labs: Basic Metabolic Panel: No results for input(s): NA, K, CL, CO2, GLUCOSE,  BUN, CREATININE, CALCIUM, MG, PHOS in the last 168 hours. Liver Function Tests: No results  for input(s): AST, ALT, ALKPHOS, BILITOT, PROT, ALBUMIN in the last 168 hours. No results for input(s): LIPASE, AMYLASE in the last 168 hours. No results for input(s): AMMONIA in the last 168 hours. CBC: No results for input(s): WBC, NEUTROABS, HGB, HCT, MCV, PLT in the last 168 hours. Cardiac Enzymes: No results for input(s): CKTOTAL, CKMB, CKMBINDEX, TROPONINI in the last 168 hours. BNP: BNP (last 3 results) No results for input(s): BNP in the last 8760 hours.  ProBNP (last 3 results) No results for input(s): PROBNP in the last 8760 hours.  CBG: No results for input(s): GLUCAP in the last 168 hours.     Signed:  Domenic Polite MD.  [facility] 08/27/2018, 1:45 PM

## 2018-08-30 ENCOUNTER — Encounter: Payer: Self-pay | Admitting: Family Medicine

## 2018-08-30 ENCOUNTER — Telehealth: Payer: Self-pay | Admitting: *Deleted

## 2018-08-30 DIAGNOSIS — I10 Essential (primary) hypertension: Secondary | ICD-10-CM | POA: Diagnosis not present

## 2018-08-30 DIAGNOSIS — Z9181 History of falling: Secondary | ICD-10-CM | POA: Diagnosis not present

## 2018-08-30 DIAGNOSIS — Z87891 Personal history of nicotine dependence: Secondary | ICD-10-CM | POA: Diagnosis not present

## 2018-08-30 DIAGNOSIS — I69354 Hemiplegia and hemiparesis following cerebral infarction affecting left non-dominant side: Secondary | ICD-10-CM | POA: Diagnosis not present

## 2018-08-30 NOTE — Telephone Encounter (Signed)
Received Home Health Certification and Plan of Care; forwarded to provider/SLS 02/10 

## 2018-08-30 NOTE — Telephone Encounter (Signed)
Attempted to reach pt and left detailed message that it appears the only openings this week will be 09/01/18 and it would be best for her to call [phone] to speak with an operator to schedule a time that will work for her.

## 2018-09-03 ENCOUNTER — Ambulatory Visit: Payer: Self-pay

## 2018-09-03 ENCOUNTER — Other Ambulatory Visit: Payer: Self-pay

## 2018-09-03 DIAGNOSIS — I10 Essential (primary) hypertension: Secondary | ICD-10-CM | POA: Diagnosis not present

## 2018-09-03 DIAGNOSIS — Z87891 Personal history of nicotine dependence: Secondary | ICD-10-CM | POA: Diagnosis not present

## 2018-09-03 DIAGNOSIS — I69354 Hemiplegia and hemiparesis following cerebral infarction affecting left non-dominant side: Secondary | ICD-10-CM | POA: Diagnosis not present

## 2018-09-03 DIAGNOSIS — Z9181 History of falling: Secondary | ICD-10-CM | POA: Diagnosis not present

## 2018-09-03 NOTE — Patient Outreach (Signed)
[facility]) Care Management  09/03/2018  Amanda Saunders 08-05-1955 [id number]   Unsuccessful outreach to patient to ensure receipt of [employer] information mailed on 08/24/18. BSW left voicemail message.  Will attempt to reach again within four business days.   Ronn Melena, BSW Social Worker [phone]

## 2018-09-04 ENCOUNTER — Encounter: Payer: Self-pay | Admitting: Family Medicine

## 2018-09-05 NOTE — Progress Notes (Deleted)
[place] at [facility] [address] [place], [place] [address] [phone]  Date:  09/06/2018   Name:  Amanda Saunders   DOB:  [birth year]   MRN:  [id number]  PCP:  Darreld Mclean, MD    Chief Complaint: No chief complaint on file.   History of Present Illness:  Amanda Saunders is a 63 y.o. very pleasant female patient who presents with the following:  Here today for hospital follow-up visit History of prediabetes  She was recently admitted with a recurrent stroke-see summary as follows:  Admit date: 08/14/2018 Discharge date: 08/17/2018 Recommendations for Outpatient Follow-up:  1. PCP in 1 week 2.   Neurology Dr. Erlinda Hong in 4 weeks Home health PT OT  Discharge Diagnoses:  Principal Problem:   Acute ischemic stroke [facility]) Active Problems:   HTN (hypertension) History of SLE Borderline diabetes  History of present illness:  63 year old female with history of lupus, hypertension, history of prior stroke with mild residual left-sided weakness, cardiomyopathy presented to the ED with left arm tingling, weakness   Hospital Course:  Acute right MCA infarct -With residual left sided paresthesias and weakness, worse from baseline -Neurology consulted -MRA with M1, M2 stenosis or occlusion -She is on aspirin 81 mg at baseline, currently on aspirin 325 mg daily with Plavix for 3 months followed by Plavix alone, also continue atorvastatin -2D echocardiogram shows EF of 45 to 50%, unchanged from prior -Carotid duplex without significant stenosis -LDL is 77, hemoglobin A1c 6.2 -PT/OT/SLP evaluationscompleted, originally SNF was recommended however she had good clinical improvement and hence discharged home with home health PT, OT History of lupus -Continue hydroxychloroquine Borderline diabetes -A1c 6.2 -Lifestyle modification advised Hypertension -Permissive hypertension in the setting of acute CVA, initially  antihypertensives held and then Toprol and HCTZ were resumed   Flu shot: Mammogram: Patient Active Problem List   Diagnosis Date Noted  . Acute ischemic stroke ([place]) 08/14/2018  . HTN (hypertension) 08/14/2018  . Pre-diabetes 04/03/2016  . History of CVA (cerebrovascular accident) 03/12/2016  . Chest pain at rest 03/12/2016  . Peripheral edema 03/12/2016    Past Medical History:  Diagnosis Date  . Cardiomyopathy ([place])   . Hypertension   . PUD (peptic ulcer disease)   . Stroke ([place])   . Vertigo     Past Surgical History:  Procedure Laterality Date  . BACK SURGERY    . CARPAL TUNNEL RELEASE    . NECK SURGERY     2015    Social History   Tobacco Use  . Smoking status: Former Smoker    Last attempt to quit: 07/21/2002    Years since quitting: 16.1  . Smokeless tobacco: Never Used  Substance Use Topics  . Alcohol use: Yes    Comment: occasional  . Drug use: No    Family History  Problem Relation Age of Onset  . Hypertension Mother   . Arthritis Mother   . Heart failure Mother   . Stroke Mother   . Kidney disease Father   . Hypertension Father     Allergies  Allergen Reactions  . Codeine Nausea And Vomiting    Medication list has been reviewed and updated.  Current Outpatient Medications on File Prior to Visit  Medication Sig Dispense Refill  . acetaminophen (TYLENOL) 325 MG tablet Take 2 tablets (650 mg total) by mouth every 4 (four) hours as needed for mild pain (or temp > 37.5 C (99.5  F)).    . aspirin 325 MG tablet Take 1 tablet (325 mg total) by mouth daily.    Marland Kitchen atorvastatin (LIPITOR) 10 MG tablet Take 1 tablet (10 mg total) by mouth daily. 90 tablet 3  . clopidogrel (PLAVIX) 75 MG tablet Take 1 tablet (75 mg total) by mouth daily. 30 tablet 1  . hydrochlorothiazide (HYDRODIURIL) 25 MG tablet Take 1 tablet (25 mg total) by mouth every other day. 45 tablet 3  . hydroxychloroquine (PLAQUENIL) 200 MG tablet Take 200 mg by mouth 2 (two) times daily.     . meclizine (ANTIVERT) 25 MG tablet Take 1 tablet (25 mg total) by mouth 2 (two) times daily as needed for dizziness. 30 tablet 2  . metoprolol succinate (TOPROL-XL) 25 MG 24 hr tablet Take 1 tablet (25 mg total) by mouth daily. 90 tablet 3   No current facility-administered medications on file prior to visit.     Review of Systems:  As per HPI- otherwise negative.   Physical Examination: There were no vitals filed for this visit. There were no vitals filed for this visit. There is no height or weight on file to calculate BMI. Ideal Body Weight:    GEN: WDWN, NAD, Non-toxic, A & O x 3 HEENT: Atraumatic, Normocephalic. Neck supple. No masses, No LAD. Ears and Nose: No external deformity. CV: RRR, No M/G/R. No JVD. No thrill. No extra heart sounds. PULM: CTA B, no wheezes, crackles, rhonchi. No retractions. No resp. distress. No accessory muscle use. ABD: S, NT, ND, +BS. No rebound. No HSM. EXTR: No c/c/e NEURO Normal gait.  PSYCH: Normally interactive. Conversant. Not depressed or anxious appearing.  Calm demeanor.    Assessment and Plan: ***  Signed Lamar Blinks, MD

## 2018-09-06 ENCOUNTER — Ambulatory Visit: Payer: Medicare Other | Admitting: Family Medicine

## 2018-09-06 ENCOUNTER — Other Ambulatory Visit: Payer: Self-pay

## 2018-09-06 NOTE — Patient Outreach (Signed)
[facility]) Care Management  09/06/2018  Amanda Saunders [birth year] [id number]   Successful outreach to [name] to ensure receipt of [employer] information that was mailed by Nadara Eaton on 08/24/18.  [name] did confirm receipt and verbalized understanding of how to use service.  BSW is closing case at this time due to no other social work needs being identified.    Ronn Melena, BSW Social Worker [phone]

## 2018-09-07 DIAGNOSIS — Z09 Encounter for follow-up examination after completed treatment for conditions other than malignant neoplasm: Secondary | ICD-10-CM | POA: Insufficient documentation

## 2018-09-07 DIAGNOSIS — Z87891 Personal history of nicotine dependence: Secondary | ICD-10-CM | POA: Diagnosis not present

## 2018-09-07 DIAGNOSIS — Z9181 History of falling: Secondary | ICD-10-CM | POA: Diagnosis not present

## 2018-09-07 DIAGNOSIS — I69354 Hemiplegia and hemiparesis following cerebral infarction affecting left non-dominant side: Secondary | ICD-10-CM | POA: Diagnosis not present

## 2018-09-07 DIAGNOSIS — I10 Essential (primary) hypertension: Secondary | ICD-10-CM | POA: Diagnosis not present

## 2018-09-07 NOTE — Progress Notes (Signed)
[place] [facility] [address] [place], [place] [address] [phone]  Date:  09/09/2018   Name:  Amanda Saunders   DOB:  November 09, 1955   MRN:  [id number]  PCP:  Darreld Mclean, MD    Chief Complaint: Pre-Diabetes (follow up); Hypertension; and Lupus   History of Present Illness:  Amanda Saunders is a 63 y.o. very pleasant female patient who presents with the following:  History of prediabetes, lupus, hypertension, stroke in 2015 She also has history of cardiomyopathy which is monitored by cardiology Here today for follow-up visit-she was admitted to the hospital last month for recurrent stroke  Admit date: 08/14/2018 Discharge date: 08/17/2018 Recommendations for Outpatient Follow-up:  1. PCP in 1 week 2.   Neurology Dr. Erlinda Hong in 4 weeks Home health PT OT  Discharge Diagnoses:  Principal Problem:   Acute ischemic stroke [facility]) Active Problems:   HTN (hypertension) History of SLE Borderline diabetes     Filed Weights   08/14/18 2244  Weight: 89.8 kg   History of present illness:  63 year old female with history of lupus, hypertension, history of prior stroke with mild residual left-sided weakness, cardiomyopathy presented to the ED with left arm tingling, weakness   Hospital Course:  Acute right MCA infarct -With residual left sided paresthesias and weakness, worse from baseline -Neurology consulted -MRA with M1, M2 stenosis or occlusion -She is on aspirin 81 mg at baseline, currently on aspirin 325 mg daily with Plavix for 3 months followed by Plavix alone, also continue atorvastatin -2D echocardiogram shows EF of 45 to 50%, unchanged from prior -Carotid duplex without significant stenosis -LDL is 77, hemoglobin A1c 6.2 -PT/OT/SLP evaluationscompleted, originally SNF was recommended however she had good clinical improvement and hence discharged home with home health PT, OT History of  lupus -Continue hydroxychloroquine Borderline diabetes -A1c 6.2 -Lifestyle modification advised Hypertension -Permissive hypertension in the setting of acute CVA, initially antihypertensives held and then Toprol and HCTZ were resumed  On the day for stroke, her grand-daughter noted she "did not look right," they took her to the ER  She was not really aware of what was wrong, "I was out of it" but the ER noted that she had left sided weakness She now feels like she is back to normal except for her left leg is a little weak and her left hand is weak However these deficits do not slow her down much  OT is done with her, but she is still doing physical therapy for her balance   She is seeing neurology next month   Patient Active Problem List   Diagnosis Date Noted  . Hospital discharge follow-up 09/07/2018  . Acute ischemic stroke ([place]) 08/14/2018  . HTN (hypertension) 08/14/2018  . Pre-diabetes 04/03/2016  . History of CVA (cerebrovascular accident) 03/12/2016  . Chest pain at rest 03/12/2016  . Peripheral edema 03/12/2016    Past Medical History:  Diagnosis Date  . Cardiomyopathy ([place])   . Hypertension   . PUD (peptic ulcer disease)   . Stroke ([place])   . Vertigo     Past Surgical History:  Procedure Laterality Date  . BACK SURGERY    . CARPAL TUNNEL RELEASE    . NECK SURGERY     2015    Social History   Tobacco Use  . Smoking status: Former Smoker    Last attempt to quit: 07/21/2002    Years since quitting: 16.1  .  Smokeless tobacco: Never Used  Substance Use Topics  . Alcohol use: Yes    Comment: occasional  . Drug use: No    Family History  Problem Relation Age of Onset  . Hypertension Mother   . Arthritis Mother   . Heart failure Mother   . Stroke Mother   . Kidney disease Father   . Hypertension Father     Allergies  Allergen Reactions  . Codeine Nausea And Vomiting    Medication list has been reviewed and updated.  Current Outpatient  Medications on File Prior to Visit  Medication Sig Dispense Refill  . acetaminophen (TYLENOL) 325 MG tablet Take 2 tablets (650 mg total) by mouth every 4 (four) hours as needed for mild pain (or temp > 37.5 C (99.5 F)).    Marland Kitchen aspirin 325 MG tablet Take 1 tablet (325 mg total) by mouth daily.    Marland Kitchen atorvastatin (LIPITOR) 10 MG tablet Take 1 tablet (10 mg total) by mouth daily. 90 tablet 3  . clopidogrel (PLAVIX) 75 MG tablet Take 1 tablet (75 mg total) by mouth daily. 30 tablet 1  . hydrochlorothiazide (HYDRODIURIL) 25 MG tablet Take 1 tablet (25 mg total) by mouth every other day. 45 tablet 3  . hydroxychloroquine (PLAQUENIL) 200 MG tablet Take 200 mg by mouth 2 (two) times daily.    . meclizine (ANTIVERT) 25 MG tablet Take 1 tablet (25 mg total) by mouth 2 (two) times daily as needed for dizziness. 30 tablet 2  . metoprolol succinate (TOPROL-XL) 25 MG 24 hr tablet Take 1 tablet (25 mg total) by mouth daily. 90 tablet 3   No current facility-administered medications on file prior to visit.     Review of Systems:  As per HPI- otherwise negative.   Physical Examination: Vitals:   09/09/18 0914  BP: 128/80  Pulse: 67  Resp: 16  Temp: 98.6 F (37 C)  SpO2: 97%   Vitals:   09/09/18 0914  Weight: 202 lb (91.6 kg)  Height: 5\' 3"  (1.6 m)   Body mass index is 35.78 kg/m. Ideal Body Weight: Weight in (lb) to have BMI = 25: 140.8  GEN: WDWN, NAD, Non-toxic, A & O x 3 HEENT: Atraumatic, Normocephalic. Neck supple. No masses, No LAD. Ears and Nose: No external deformity. CV: RRR, No M/G/R. No JVD. No thrill. No extra heart sounds. PULM: CTA B, no wheezes, crackles, rhonchi. No retractions. No resp. distress. No accessory muscle use. ABD: S, NT, ND, +BS. No rebound. No HSM. EXTR: No c/c/e NEURO Normal gait.  PSYCH: Normally interactive. Conversant. Not depressed or anxious appearing.  Calm demeanor.  Face is symmetrical, and she has normal facial strength Extremities display normal  strength, I am really not able to perceive any left-sided weakness.  Normal sensation of her extremities She is wobbly in Romberg testing, and is not able to perform tandem stance    Assessment and Plan: Acute ischemic stroke [facility])  Hospital discharge follow-up  Screening for breast cancer - Plan: MM 3D SCREEN BREAST BILATERAL  Following up from hospitalization for acute stroke.  She is overall quite well, her left-sided weakness is virtually resolved.  Her balance is still abnormal, and she is doing physical therapy She is on dual antiplatelets therapy at this time. She has seen her neurologist in a few weeks to discuss the next steps I ordered a screening mammogram for her today Asked her to see me in 4 months   Signed Lamar Blinks, MD

## 2018-09-08 ENCOUNTER — Telehealth: Payer: Self-pay | Admitting: *Deleted

## 2018-09-08 NOTE — Telephone Encounter (Signed)
Received Physician Orders from [place]; forwarded to provider/SLS 02/19

## 2018-09-09 ENCOUNTER — Encounter: Payer: Self-pay | Admitting: Family Medicine

## 2018-09-09 ENCOUNTER — Ambulatory Visit (INDEPENDENT_AMBULATORY_CARE_PROVIDER_SITE_OTHER): Payer: Medicare Other | Admitting: Family Medicine

## 2018-09-09 VITALS — BP 128/80 | HR 67 | Temp 98.6°F | Resp 16 | Ht 63.0 in | Wt 202.0 lb

## 2018-09-09 DIAGNOSIS — Z1239 Encounter for other screening for malignant neoplasm of breast: Secondary | ICD-10-CM

## 2018-09-09 DIAGNOSIS — Z09 Encounter for follow-up examination after completed treatment for conditions other than malignant neoplasm: Secondary | ICD-10-CM

## 2018-09-09 DIAGNOSIS — I639 Cerebral infarction, unspecified: Secondary | ICD-10-CM

## 2018-09-09 NOTE — Patient Instructions (Addendum)
It was great to see you today, I am so glad that you have recovered from your stroke. Continue to do your physical therapy, and take aspirin and Plavix as you are directed.  Your neurologist can give you advice on how long to continue taking Plavix  I ordered a mammogram for you today, please stop in at the imaging department on the ground floor and schedule this  Otherwise, please see me in about 4 months for recheck

## 2018-09-13 ENCOUNTER — Ambulatory Visit: Payer: Medicare Other

## 2018-09-16 DIAGNOSIS — I10 Essential (primary) hypertension: Secondary | ICD-10-CM | POA: Diagnosis not present

## 2018-09-16 DIAGNOSIS — I69354 Hemiplegia and hemiparesis following cerebral infarction affecting left non-dominant side: Secondary | ICD-10-CM | POA: Diagnosis not present

## 2018-09-16 DIAGNOSIS — Z9181 History of falling: Secondary | ICD-10-CM | POA: Diagnosis not present

## 2018-09-16 DIAGNOSIS — Z87891 Personal history of nicotine dependence: Secondary | ICD-10-CM | POA: Diagnosis not present

## 2018-09-22 ENCOUNTER — Encounter (HOSPITAL_BASED_OUTPATIENT_CLINIC_OR_DEPARTMENT_OTHER): Payer: Self-pay

## 2018-09-22 ENCOUNTER — Ambulatory Visit (HOSPITAL_BASED_OUTPATIENT_CLINIC_OR_DEPARTMENT_OTHER)
Admission: RE | Admit: 2018-09-22 | Discharge: 2018-09-22 | Disposition: A | Discharge status: Home / Self Care | Payer: Medicare Other | Source: Ambulatory Visit | Attending: Family Medicine | Admitting: Family Medicine

## 2018-09-22 DIAGNOSIS — Z1231 Encounter for screening mammogram for malignant neoplasm of breast: Secondary | ICD-10-CM | POA: Diagnosis not present

## 2018-09-22 DIAGNOSIS — Z1239 Encounter for other screening for malignant neoplasm of breast: Secondary | ICD-10-CM

## 2018-09-24 ENCOUNTER — Telehealth: Payer: Self-pay | Admitting: *Deleted

## 2018-09-24 NOTE — Telephone Encounter (Signed)
Received Physician Orders from [facility]; forwarded to provider/SLS 03/06

## 2018-09-29 NOTE — Progress Notes (Deleted)
Subjective:   Amanda Saunders is a 63 y.o. female who presents for Medicare Annual (Subsequent) preventive examination.  Review of Systems: No ROS.  Medicare Wellness Visit. Additional risk factors are reflected in the social history.   Sleep patterns:  Home Safety/Smoke Alarms: Feels safe in home. Smoke alarms in place.    Female:   Pap-  2017     Mammo- 09/22/2018       Dexa scan-        CCS- last pt reported 07/2010 in [place]     Objective:     Vitals: There were no vitals taken for this visit.  There is no height or weight on file to calculate BMI.  Advanced Directives 08/15/2018 08/14/2018 06/18/2018 06/03/2018 03/15/2018 01/08/2018 12/23/2016  Does Patient Have a Medical Advance Directive? - No No No No No No  Would patient like information on creating a medical advance directive? No - Patient declined - No - Patient declined No - Patient declined - No - Patient declined Yes (MAU/Ambulatory/Procedural Areas - Information given)    Tobacco Social History   Tobacco Use  Smoking Status Former Smoker  . Last attempt to quit: 07/21/2002  . Years since quitting: 16.2  Smokeless Tobacco Never Used     Counseling given: Not Answered   Clinical Intake:                       Past Medical History:  Diagnosis Date  . Cardiomyopathy ([place])   . Hypertension   . PUD (peptic ulcer disease)   . Stroke ([place])   . Vertigo    Past Surgical History:  Procedure Laterality Date  . BACK SURGERY    . CARPAL TUNNEL RELEASE    . NECK SURGERY     2015   Family History  Problem Relation Age of Onset  . Hypertension Mother   . Arthritis Mother   . Heart failure Mother   . Stroke Mother   . Kidney disease Father   . Hypertension Father    Social History   Socioeconomic History  . Marital status: Single    Spouse name: Not on file  . Number of children: 1  . Years of education: Not on file  . Highest education level: Not on file  Occupational History    Comment:  retired  Scientific laboratory technician  . Financial resource strain: Not on file  . Food insecurity:    Worry: Not on file    Inability: Not on file  . Transportation needs:    Medical: Not on file    Non-medical: Not on file  Tobacco Use  . Smoking status: Former Smoker    Last attempt to quit: 07/21/2002    Years since quitting: 16.2  . Smokeless tobacco: Never Used  Substance and Sexual Activity  . Alcohol use: Yes    Comment: occasional  . Drug use: No  . Sexual activity: Not on file  Lifestyle  . Physical activity:    Days per week: Not on file    Minutes per session: Not on file  . Stress: Not on file  Relationships  . Social connections:    Talks on phone: Not on file    Gets together: Not on file    Attends religious service: Not on file    Active member of club or organization: Not on file    Attends meetings of clubs or organizations: Not on file    Relationship status:  Not on file  Other Topics Concern  . Not on file  Social History Narrative  . Not on file    Outpatient Encounter Medications as of 09/30/2018  Medication Sig  . acetaminophen (TYLENOL) 325 MG tablet Take 2 tablets (650 mg total) by mouth every 4 (four) hours as needed for mild pain (or temp > 37.5 C (99.5 F)).  Marland Kitchen aspirin 325 MG tablet Take 1 tablet (325 mg total) by mouth daily.  Marland Kitchen atorvastatin (LIPITOR) 10 MG tablet Take 1 tablet (10 mg total) by mouth daily.  . clopidogrel (PLAVIX) 75 MG tablet Take 1 tablet (75 mg total) by mouth daily.  . hydrochlorothiazide (HYDRODIURIL) 25 MG tablet Take 1 tablet (25 mg total) by mouth every other day.  . hydroxychloroquine (PLAQUENIL) 200 MG tablet Take 200 mg by mouth 2 (two) times daily.  . meclizine (ANTIVERT) 25 MG tablet Take 1 tablet (25 mg total) by mouth 2 (two) times daily as needed for dizziness.  . metoprolol succinate (TOPROL-XL) 25 MG 24 hr tablet Take 1 tablet (25 mg total) by mouth daily.   No facility-administered encounter medications on file as of  09/30/2018.     Activities of Daily Living In your present state of health, do you have any difficulty performing the following activities: 08/15/2018  Hearing? N  Vision? N  Difficulty concentrating or making decisions? N  Walking or climbing stairs? N  Dressing or bathing? N  Doing errands, shopping? N  Some recent data might be hidden    Patient Care Team: Copland, Gay Filler, MD as PCP - General (Family Medicine)    Assessment:   This is a routine wellness examination for Amanda Saunders. Physical assessment deferred to PCP.  Exercise Activities and Dietary recommendations   Diet (meal preparation, eat out, water intake, caffeinated beverages, dairy products, fruits and vegetables): {Desc; diets:16563} Breakfast: Lunch:  Dinner:      Goals    . <enter goal here> (pt-stated)     Lose 20 lbs     . <enter goal here> (pt-stated)     Buy new house.        Fall Risk Fall Risk  10/22/2016 05/14/2016  Falls in the past year? No No    Depression Screen PHQ 2/9 Scores 06/18/2018 10/22/2016  PHQ - 2 Score 0 0     Cognitive Function MMSE - Mini Mental State Exam 05/14/2016  Orientation to time 5  Orientation to Place 5  Registration 3  Attention/ Calculation 4  Recall 3  Language- name 2 objects 2  Language- repeat 1  Language- follow 3 step command 3  Language- read & follow direction 1  Write a sentence 1  Copy design 1  Total score 29        Immunization History  Administered Date(s) Administered  . Tdap 03/12/2016    Screening Tests Health Maintenance  Topic Date Due  . INFLUENZA VACCINE  10/19/2018 (Originally 02/18/2018)  . PAP SMEAR-Modifier  04/03/2019  . MAMMOGRAM  09/21/2020  . COLONOSCOPY  09/03/2021  . TETANUS/TDAP  03/12/2026  . Hepatitis C Screening  Completed  . HIV Screening  Completed       Plan:   ***   I have personally reviewed and noted the following in the patient's chart:   . Medical and social history . Use of alcohol, tobacco  or illicit drugs  . Current medications and supplements . Functional ability and status . Nutritional status . Physical activity . Advanced directives . List  of other physicians . Hospitalizations, surgeries, and ER visits in previous 12 months . Vitals . Screenings to include cognitive, depression, and falls . Referrals and appointments  In addition, I have reviewed and discussed with patient certain preventive protocols, quality metrics, and best practice recommendations. A written personalized care plan for preventive services as well as general preventive health recommendations were provided to patient.     Shela Nevin, [place]  09/29/2018

## 2018-09-30 ENCOUNTER — Ambulatory Visit: Payer: Medicare Other | Admitting: *Deleted

## 2018-10-08 NOTE — Progress Notes (Deleted)
Subjective:   Amanda Saunders is a 63 y.o. female who presents for Medicare Annual (Subsequent) preventive examination.  Review of Systems: No ROS.  Medicare Wellness Visit. Additional risk factors are reflected in the social history.    Sleep patterns:  Home Safety/Smoke Alarms: Feels safe in home. Smoke alarms in place.   Female:   Pap-       Mammo- 09/22/18          CCS- last 2012. 10 yr recall      Objective:     Vitals: There were no vitals taken for this visit.  There is no height or weight on file to calculate BMI.  Advanced Directives 08/15/2018 08/14/2018 06/18/2018 06/03/2018 03/15/2018 01/08/2018 12/23/2016  Does Patient Have a Medical Advance Directive? - No No No No No No  Would patient like information on creating a medical advance directive? No - Patient declined - No - Patient declined No - Patient declined - No - Patient declined Yes (MAU/Ambulatory/Procedural Areas - Information given)    Tobacco Social History   Tobacco Use  Smoking Status Former Smoker  . Last attempt to quit: 07/21/2002  . Years since quitting: 16.2  Smokeless Tobacco Never Used     Counseling given: Not Answered   Clinical Intake:                       Past Medical History:  Diagnosis Date  . Cardiomyopathy ([place])   . Hypertension   . PUD (peptic ulcer disease)   . Stroke ([place])   . Vertigo    Past Surgical History:  Procedure Laterality Date  . BACK SURGERY    . CARPAL TUNNEL RELEASE    . NECK SURGERY     2015   Family History  Problem Relation Age of Onset  . Hypertension Mother   . Arthritis Mother   . Heart failure Mother   . Stroke Mother   . Kidney disease Father   . Hypertension Father    Social History   Socioeconomic History  . Marital status: Single    Spouse name: Not on file  . Number of children: 1  . Years of education: Not on file  . Highest education level: Not on file  Occupational History    Comment: retired  Scientific laboratory technician  .  Financial resource strain: Not on file  . Food insecurity:    Worry: Not on file    Inability: Not on file  . Transportation needs:    Medical: Not on file    Non-medical: Not on file  Tobacco Use  . Smoking status: Former Smoker    Last attempt to quit: 07/21/2002    Years since quitting: 16.2  . Smokeless tobacco: Never Used  Substance and Sexual Activity  . Alcohol use: Yes    Comment: occasional  . Drug use: No  . Sexual activity: Not on file  Lifestyle  . Physical activity:    Days per week: Not on file    Minutes per session: Not on file  . Stress: Not on file  Relationships  . Social connections:    Talks on phone: Not on file    Gets together: Not on file    Attends religious service: Not on file    Active member of club or organization: Not on file    Attends meetings of clubs or organizations: Not on file    Relationship status: Not on file  Other Topics  Concern  . Not on file  Social History Narrative  . Not on file    Outpatient Encounter Medications as of 10/11/2018  Medication Sig  . acetaminophen (TYLENOL) 325 MG tablet Take 2 tablets (650 mg total) by mouth every 4 (four) hours as needed for mild pain (or temp > 37.5 C (99.5 F)).  Marland Kitchen aspirin 325 MG tablet Take 1 tablet (325 mg total) by mouth daily.  Marland Kitchen atorvastatin (LIPITOR) 10 MG tablet Take 1 tablet (10 mg total) by mouth daily.  . clopidogrel (PLAVIX) 75 MG tablet Take 1 tablet (75 mg total) by mouth daily.  . hydrochlorothiazide (HYDRODIURIL) 25 MG tablet Take 1 tablet (25 mg total) by mouth every other day.  . hydroxychloroquine (PLAQUENIL) 200 MG tablet Take 200 mg by mouth 2 (two) times daily.  . meclizine (ANTIVERT) 25 MG tablet Take 1 tablet (25 mg total) by mouth 2 (two) times daily as needed for dizziness.  . metoprolol succinate (TOPROL-XL) 25 MG 24 hr tablet Take 1 tablet (25 mg total) by mouth daily.   No facility-administered encounter medications on file as of 10/11/2018.     Activities of  Daily Living In your present state of health, do you have any difficulty performing the following activities: 08/15/2018  Hearing? N  Vision? N  Difficulty concentrating or making decisions? N  Walking or climbing stairs? N  Dressing or bathing? N  Doing errands, shopping? N  Some recent data might be hidden    Patient Care Team: Copland, Gay Filler, MD as PCP - General (Family Medicine)    Assessment:   This is a routine wellness examination for Amanda Saunders. Physical assessment deferred to PCP.  Exercise Activities and Dietary recommendations   Diet (meal preparation, eat out, water intake, caffeinated beverages, dairy products, fruits and vegetables): {Desc; diets:16563} Breakfast: Lunch:  Dinner:      Goals    . <enter goal here> (pt-stated)     Lose 20 lbs     . <enter goal here> (pt-stated)     Buy new house.        Fall Risk Fall Risk  10/22/2016 05/14/2016  Falls in the past year? No No     Depression Screen PHQ 2/9 Scores 06/18/2018 10/22/2016  PHQ - 2 Score 0 0     Cognitive Function MMSE - Mini Mental State Exam 05/14/2016  Orientation to time 5  Orientation to Place 5  Registration 3  Attention/ Calculation 4  Recall 3  Language- name 2 objects 2  Language- repeat 1  Language- follow 3 step command 3  Language- read & follow direction 1  Write a sentence 1  Copy design 1  Total score 29        Immunization History  Administered Date(s) Administered  . Tdap 03/12/2016    Screening Tests Health Maintenance  Topic Date Due  . INFLUENZA VACCINE  10/19/2018 (Originally 02/18/2018)  . PAP SMEAR-Modifier  04/03/2019  . MAMMOGRAM  09/21/2020  . COLONOSCOPY  09/03/2021  . TETANUS/TDAP  03/12/2026  . Hepatitis C Screening  Completed  . HIV Screening  Completed       Plan:   ***   I have personally reviewed and noted the following in the patient's chart:   . Medical and social history . Use of alcohol, tobacco or illicit drugs  . Current  medications and supplements . Functional ability and status . Nutritional status . Physical activity . Advanced directives . List of other physicians . Hospitalizations,  surgeries, and ER visits in previous 12 months . Vitals . Screenings to include cognitive, depression, and falls . Referrals and appointments  In addition, I have reviewed and discussed with patient certain preventive protocols, quality metrics, and best practice recommendations. A written personalized care plan for preventive services as well as general preventive health recommendations were provided to patient.     Shela Nevin, [place]  10/08/2018

## 2018-10-11 ENCOUNTER — Encounter: Payer: Self-pay | Admitting: Family Medicine

## 2018-10-11 ENCOUNTER — Ambulatory Visit: Payer: Medicare Other | Admitting: *Deleted

## 2018-10-11 MED ORDER — CLOPIDOGREL BISULFATE 75 MG PO TABS: 75.0000 mg | ORAL_TABLET | Freq: Every day | ORAL | 1 refills | Status: DC

## 2018-11-11 ENCOUNTER — Encounter: Payer: Self-pay | Admitting: Family Medicine

## 2018-11-12 NOTE — Progress Notes (Signed)
Virtual Visit via Video Note The purpose of this virtual visit is to provide medical care while limiting exposure to the novel coronavirus.    Consent was obtained for video visit:  Yes.   Answered questions that patient had about telehealth interaction:  Yes.   I discussed the limitations, risks, security and privacy concerns of performing an evaluation and management service by telemedicine. I also discussed with the patient that there may be a patient responsible charge related to this service. The patient expressed understanding and agreed to proceed.  Pt location: Home Physician Location: office Name of referring provider:  Copland, Gay Filler, MD I connected with Amanda Saunders at patients initiation/request on 11/15/2018 at 10:00 AM EDT by video enabled telemedicine application and verified that I am speaking with the correct person using two identifiers. Pt MRN:  [id number] Pt DOB:  [birth year] Video Participants:  Amanda Saunders   History of Present Illness:  Amanda Saunders is a 63 year old left-handed woman whom I previously saw for stroke in 2017, follows up for another recent stroke.    Patient last seen in 2017.  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  Carotid doppler showed 1-49% bilateral ICA stenosis with antegrade flow in both vertebral arteries.  She was subsequently lost to follow up.  She was admitted to [facility] on 08/14/18 for increased left arm numbness and weakness with left sided tremor as well as headache, dizziness with nausea and vomiting.  CT of head was personally reviewed and showed no acute findings.  MRI of brain personally reviewed and demonstrated scattered acute watershed subcortical and small cortical infarcts in the right MCA/ACA and MCA/PCA areas.  MRA of  head personally reviewed showed severe stenosis or short segment occlusion of the distal right MCA M1 and proximal M2 segments as well as proximal severe stenosis or short segment occlusion of left MCA M2 segment.  Carotid doppler showed no hemodynamically significant stenosis.  2D echocardiogram showed EF 45-50%.  LDL was 77.  Hgb A1c was 6.2.  ASA 81mg  daily was switched to ASA 325mg  and Plavix 75mg  daily for 3 months with plan to subsequently continue Plavix alone.  She was continued on atorvastatin 40mg  daily.    She has been doing well.  Her left hand is mildly weak but she exercises it.  She is talking and walking without difficulty.  Occasionally, she feels numb across the forehead.  She has still been taking ASA with Plavix.  Past Medical History: Past Medical History:  Diagnosis Date  . Cardiomyopathy ([place])   . Hypertension   . PUD (peptic ulcer disease)   . Stroke ([place])   . Vertigo     Medications: Outpatient Encounter Medications as of 11/15/2018  Medication Sig  . acetaminophen (TYLENOL) 325 MG tablet Take 2 tablets (650 mg total) by mouth every 4 (four) hours as needed for mild pain (or temp > 37.5 C (99.5 F)).  Marland Kitchen aspirin 325 MG tablet Take 1 tablet (325 mg total) by mouth daily.  Marland Kitchen atorvastatin (LIPITOR) 10 MG tablet Take 1 tablet (10 mg total) by mouth daily.  . clopidogrel (PLAVIX) 75 MG tablet Take 1 tablet (75 mg total) by mouth daily.  . hydrochlorothiazide (HYDRODIURIL) 25 MG tablet Take 1 tablet (25 mg total) by mouth every other day.  . hydroxychloroquine (PLAQUENIL)  200 MG tablet Take 200 mg by mouth 2 (two) times daily.  . meclizine (ANTIVERT) 25 MG tablet Take 1 tablet (25 mg total) by mouth 2 (two) times daily as needed for dizziness.  . metoprolol succinate (TOPROL-XL) 25 MG 24 hr tablet Take 1 tablet (25 mg total) by mouth daily.   No facility-administered encounter medications on file as of 11/15/2018.     Allergies: Allergies  Allergen Reactions  . Codeine  Nausea And Vomiting    Family History: Family History  Problem Relation Age of Onset  . Hypertension Mother   . Arthritis Mother   . Heart failure Mother   . Stroke Mother   . Kidney disease Father   . Hypertension Father     Social History: Social History   Socioeconomic History  . Marital status: Single    Spouse name: Not on file  . Number of children: 1  . Years of education: Not on file  . Highest education level: Not on file  Occupational History    Comment: retired  Scientific laboratory technician  . Financial resource strain: Not on file  . Food insecurity:    Worry: Not on file    Inability: Not on file  . Transportation needs:    Medical: Not on file    Non-medical: Not on file  Tobacco Use  . Smoking status: Former Smoker    Last attempt to quit: 07/21/2002    Years since quitting: 16.3  . Smokeless tobacco: Never Used  Substance and Sexual Activity  . Alcohol use: Yes    Comment: occasional  . Drug use: No  . Sexual activity: Not on file  Lifestyle  . Physical activity:    Days per week: Not on file    Minutes per session: Not on file  . Stress: Not on file  Relationships  . Social connections:    Talks on phone: Not on file    Gets together: Not on file    Attends religious service: Not on file    Active member of club or organization: Not on file    Attends meetings of clubs or organizations: Not on file    Relationship status: Not on file  . Intimate partner violence:    Fear of current or ex partner: Not on file    Emotionally abused: Not on file    Physically abused: Not on file    Forced sexual activity: Not on file  Other Topics Concern  . Not on file  Social History Narrative  . Not on file    Observations/Objective:   Height 5\' 3"  (1.6 m), weight 199 lb (90.3 kg). Alert and oriented.  Speech fluent and not dysarthric.  Language intact.  Eyes orthophoric and move in all directions.  Face symmetric.  Tongue midline.  No pronator drift.  Finger-thumb  tapping symmetric.  Romberg negative.    Assessment and Plan:   1.  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 2.  Hypertension 3.  Hyperlipidemia 4.  Type 2 diabetes mellitus  1.  Stop ASA and just continue Plavix 75mg  daily for secondary stroke prevention 2.  Atorvastatin 40mg  daily (LDL goal less than 70) 3.  Blood pressure control 4.  Mediterranean diet/heart healthy diet 5.  Routine exercise 6.  Follow up in 6 months.  Follow Up Instructions:    -I discussed the assessment and treatment plan with the patient. The patient was provided an opportunity to ask  questions and all were answered. The patient agreed with the plan and demonstrated an understanding of the instructions.   The patient was advised to call back or seek an in-person evaluation if the symptoms worsen or if the condition fails to improve as anticipated.    Total Time spent in visit with the patient was:  25 minutes.  Dudley Major, DO

## 2018-11-15 ENCOUNTER — Encounter: Payer: Self-pay | Admitting: Neurology

## 2018-11-15 ENCOUNTER — Other Ambulatory Visit: Payer: Self-pay

## 2018-11-15 ENCOUNTER — Telehealth (INDEPENDENT_AMBULATORY_CARE_PROVIDER_SITE_OTHER): Payer: Medicare Other | Admitting: Neurology

## 2018-11-15 VITALS — Ht 63.0 in | Wt 199.0 lb

## 2018-11-15 DIAGNOSIS — I63511 Cerebral infarction due to unspecified occlusion or stenosis of right middle cerebral artery: Secondary | ICD-10-CM | POA: Diagnosis not present

## 2018-11-15 DIAGNOSIS — R7303 Prediabetes: Secondary | ICD-10-CM

## 2018-11-15 DIAGNOSIS — I10 Essential (primary) hypertension: Secondary | ICD-10-CM

## 2018-11-15 DIAGNOSIS — E785 Hyperlipidemia, unspecified: Secondary | ICD-10-CM

## 2018-11-15 NOTE — Patient Instructions (Signed)
Stop aspirin.  Continue Plavix 75mg  daily Continue atorvastatin  Mediterranean diet  Mediterranean Diet A Mediterranean diet refers to food and lifestyle choices that are based on the traditions of countries located on the [employer]. This way of eating has been shown to help prevent certain conditions and improve outcomes for people who have chronic diseases, like kidney disease and heart disease. What are tips for following this plan? Lifestyle  Cook and eat meals together with your family, when possible.  Drink enough fluid to keep your urine clear or pale yellow.  Be physically active every day. This includes: ? Aerobic exercise like running or swimming. ? Leisure activities like gardening, walking, or housework.  Get 7-8 hours of sleep each night.  If recommended by your health care provider, drink red wine in moderation. This means 1 glass a day for nonpregnant women and 2 glasses a day for men. A glass of wine equals 5 oz (150 mL). Reading food labels   Check the serving size of packaged foods. For foods such as rice and pasta, the serving size refers to the amount of cooked product, not dry.  Check the total fat in packaged foods. Avoid foods that have saturated fat or trans fats.  Check the ingredients list for added sugars, such as corn syrup. Shopping  At the grocery store, buy most of your food from the areas near the walls of the store. This includes: ? Fresh fruits and vegetables (produce). ? Grains, beans, nuts, and seeds. Some of these may be available in unpackaged forms or large amounts (in bulk). ? Fresh seafood. ? Poultry and eggs. ? Low-fat dairy products.  Buy whole ingredients instead of prepackaged foods.  Buy fresh fruits and vegetables in-season from local farmers markets.  Buy frozen fruits and vegetables in resealable bags.  If you do not have access to quality fresh seafood, buy precooked frozen shrimp or canned fish, such as tuna,  salmon, or sardines.  Buy small amounts of raw or cooked vegetables, salads, or olives from the deli or salad bar at your store.  Stock your pantry so you always have certain foods on hand, such as olive oil, canned tuna, canned tomatoes, rice, pasta, and beans. Cooking  Cook foods with extra-virgin olive oil instead of using butter or other vegetable oils.  Have meat as a side dish, and have vegetables or grains as your main dish. This means having meat in small portions or adding small amounts of meat to foods like pasta or stew.  Use beans or vegetables instead of meat in common dishes like chili or lasagna.  Experiment with different cooking methods. Try roasting or broiling vegetables instead of steaming or sauteing them.  Add frozen vegetables to soups, stews, pasta, or rice.  Add nuts or seeds for added healthy fat at each meal. You can add these to yogurt, salads, or vegetable dishes.  Marinate fish or vegetables using olive oil, lemon juice, garlic, and fresh herbs. Meal planning   Plan to eat 1 vegetarian meal one day each week. Try to work up to 2 vegetarian meals, if possible.  Eat seafood 2 or more times a week.  Have healthy snacks readily available, such as: ? Vegetable sticks with hummus. ? [place] yogurt. ? Fruit and nut trail mix.  Eat balanced meals throughout the week. This includes: ? Fruit: 2-3 servings a day ? Vegetables: 4-5 servings a day ? Low-fat dairy: 2 servings a day ? Fish, poultry, or lean meat: 1  serving a day ? Beans and legumes: 2 or more servings a week ? Nuts and seeds: 1-2 servings a day ? Whole grains: 6-8 servings a day ? Extra-virgin olive oil: 3-4 servings a day  Limit red meat and sweets to only a few servings a month What are my food choices?  Mediterranean diet ? Recommended ? Grains: Whole-grain pasta. Brown rice. Bulgar wheat. Polenta. Couscous. Whole-wheat bread. [place]. ? Vegetables: Artichokes. Beets. Broccoli.  Cabbage. Carrots. Eggplant. Green beans. Chard. Kale. Spinach. Onions. Leeks. Peas. Squash. Tomatoes. Peppers. Radishes. ? Fruits: Apples. Apricots. Avocado. Berries. Bananas. Cherries. Dates. Figs. Grapes. Lemons. Melon. Oranges. Peaches. Plums. Pomegranate. ? Meats and other protein foods: Beans. Almonds. Sunflower seeds. Pine nuts. Peanuts. [place]. Salmon. Scallops. Shrimp. Maize. Tilapia. Clams. Oysters. Eggs. ? Dairy: Low-fat milk. Cheese. Greek yogurt. ? Beverages: Water. Red wine. Herbal tea. ? Fats and oils: Extra virgin olive oil. Avocado oil. Grape seed oil. ? Sweets and desserts: [place] yogurt with honey. Baked apples. Poached pears. Trail mix. ? Seasoning and other foods: Basil. Cilantro. Coriander. Cumin. Mint. Parsley. Sage. Rosemary. Tarragon. Garlic. Oregano. Thyme. Pepper. Balsalmic vinegar. Tahini. Hummus. Tomato sauce. Olives. Mushrooms. ? Limit these ? Grains: Prepackaged pasta or rice dishes. Prepackaged cereal with added sugar. ? Vegetables: Deep fried potatoes (french fries). ? Fruits: Fruit canned in syrup. ? Meats and other protein foods: Beef. Pork. Lamb. Poultry with skin. Hot dogs. [place] Salines. ? Dairy: Ice cream. Sour cream. Whole milk. ? Beverages: Juice. Sugar-sweetened soft drinks. Beer. Liquor and spirits. ? Fats and oils: Butter. Canola oil. Vegetable oil. Beef fat (tallow). Lard. ? Sweets and desserts: Cookies. Cakes. Pies. Candy. ? Seasoning and other foods: Mayonnaise. Premade sauces and marinades. ? The items listed may not be a complete list. Talk with your dietitian about what dietary choices are right for you. Summary  The Mediterranean diet includes both food and lifestyle choices.  Eat a variety of fresh fruits and vegetables, beans, nuts, seeds, and whole grains.  Limit the amount of red meat and sweets that you eat.  Talk with your health care provider about whether it is safe for you to drink red wine in moderation. This means 1 glass a day for  nonpregnant women and 2 glasses a day for men. A glass of wine equals 5 oz (150 mL). This information is not intended to replace advice given to you by your health care provider. Make sure you discuss any questions you have with your health care provider. Document Released: 02/28/2016 Document Revised: 04/01/2016 Document Reviewed: 02/28/2016 Elsevier Interactive Patient Education  2019 [employer].

## 2018-11-16 ENCOUNTER — Ambulatory Visit: Payer: Medicare Other | Admitting: Neurology

## 2018-11-18 DIAGNOSIS — R768 Other specified abnormal immunological findings in serum: Secondary | ICD-10-CM | POA: Diagnosis not present

## 2018-11-18 DIAGNOSIS — M7989 Other specified soft tissue disorders: Secondary | ICD-10-CM | POA: Diagnosis not present

## 2018-11-18 DIAGNOSIS — A689 Relapsing fever, unspecified: Secondary | ICD-10-CM | POA: Diagnosis not present

## 2018-11-18 DIAGNOSIS — M2550 Pain in unspecified joint: Secondary | ICD-10-CM | POA: Diagnosis not present

## 2018-11-18 DIAGNOSIS — M329 Systemic lupus erythematosus, unspecified: Secondary | ICD-10-CM | POA: Diagnosis not present

## 2018-12-16 NOTE — Progress Notes (Addendum)
Virtual Visit via Video Note  I connected with patient on 12/17/18 at  9:00 AM EDT by audio enabled telemedicine application and verified that I am speaking with the correct person using two identifiers.   THIS ENCOUNTER IS A VIRTUAL VISIT DUE TO COVID-19 - PATIENT WAS NOT SEEN IN THE OFFICE. PATIENT HAS CONSENTED TO VIRTUAL VISIT / TELEMEDICINE VISIT   Location of patient: home  Location of provider: office  I discussed the limitations of evaluation and management by telemedicine and the availability of in person appointments. The patient expressed understanding and agreed to proceed.   Subjective:   Amanda Saunders is a 63 y.o. female who presents for Medicare Annual (Subsequent) preventive examination.  Enjoys reading her bible and playing games on phone.   Review of Systems: No ROS.  Medicare Wellness Virtual Visit.  Visual/audio telehealth visit, UTA vital signs.   See social history for additional risk factors. Cardiac Risk Factors include: advanced age (>31men, >70 women);hypertension;sedentary lifestyle;obesity (BMI >30kg/m2) Sleep patterns: no issues.  Home Safety/Smoke Alarms: Feels safe in home. Smoke alarms in place.  Has custody of 21 yr old granddaughter. Lives in 1 story home.  Female:   Pap-  04/02/16     Mammo-  09/22/18           CCS- 09/04/11    Objective:       Advanced Directives 12/17/2018 11/15/2018 08/15/2018 08/14/2018 06/18/2018 06/03/2018 03/15/2018  Does Patient Have a Medical Advance Directive? No No - No No No No  Does patient want to make changes to medical advance directive? No - Patient declined - - - - - -  Would patient like information on creating a medical advance directive? - No - Patient declined No - Patient declined - No - Patient declined No - Patient declined -    Tobacco Social History   Tobacco Use  Smoking Status Former Smoker  . Last attempt to quit: 07/21/2002  . Years since quitting: 16.4  Smokeless Tobacco Never Used      Counseling given: Not Answered   Clinical Intake: Pain : No/denies pain  Past Medical History:  Diagnosis Date  . Cardiomyopathy ([place])   . Hypertension   . Lupus ([place])   . PUD (peptic ulcer disease)   . Stroke ([place]) 08/13/2018  . Vertigo    Past Surgical History:  Procedure Laterality Date  . BACK SURGERY    . CARPAL TUNNEL RELEASE    . NECK SURGERY     2015   Family History  Problem Relation Age of Onset  . Hypertension Mother   . Arthritis Mother   . Heart failure Mother   . Stroke Mother   . Kidney disease Father   . Hypertension Father    Social History   Socioeconomic History  . Marital status: Single    Spouse name: Not on file  . Number of children: 1  . Years of education: Not on file  . Highest education level: Not on file  Occupational History    Comment: retired  Scientific laboratory technician  . Financial resource strain: Not on file  . Food insecurity:    Worry: Not on file    Inability: Not on file  . Transportation needs:    Medical: Not on file    Non-medical: Not on file  Tobacco Use  . Smoking status: Former Smoker    Last attempt to quit: 07/21/2002    Years since quitting: 16.4  . Smokeless tobacco: Never Used  Substance and Sexual Activity  . Alcohol use: Yes    Comment: occasional  . Drug use: No  . Sexual activity: Not on file  Lifestyle  . Physical activity:    Days per week: Not on file    Minutes per session: Not on file  . Stress: Not on file  Relationships  . Social connections:    Talks on phone: Not on file    Gets together: Not on file    Attends religious service: Not on file    Active member of club or organization: Not on file    Attends meetings of clubs or organizations: Not on file    Relationship status: Not on file  Other Topics Concern  . Not on file  Social History Narrative  . Not on file    Outpatient Encounter Medications as of 12/17/2018  Medication Sig  . acetaminophen (TYLENOL) 325 MG tablet Take 2 tablets (650  mg total) by mouth every 4 (four) hours as needed for mild pain (or temp > 37.5 C (99.5 F)).  Marland Kitchen atorvastatin (LIPITOR) 10 MG tablet Take 1 tablet (10 mg total) by mouth daily.  . clopidogrel (PLAVIX) 75 MG tablet Take 1 tablet (75 mg total) by mouth daily.  . hydrochlorothiazide (HYDRODIURIL) 25 MG tablet Take 1 tablet (25 mg total) by mouth every other day.  . hydroxychloroquine (PLAQUENIL) 200 MG tablet Take 200 mg by mouth 2 (two) times daily.  . meclizine (ANTIVERT) 25 MG tablet Take 1 tablet (25 mg total) by mouth 2 (two) times daily as needed for dizziness.  . metoprolol succinate (TOPROL-XL) 25 MG 24 hr tablet Take 1 tablet (25 mg total) by mouth daily.  . [DISCONTINUED] aspirin 325 MG tablet Take 1 tablet (325 mg total) by mouth daily. (Patient not taking: Reported on 12/17/2018)   No facility-administered encounter medications on file as of 12/17/2018.     Activities of Daily Living In your present state of health, do you have any difficulty performing the following activities: 12/17/2018 08/15/2018  Hearing? N N  Vision? N N  Difficulty concentrating or making decisions? N N  Walking or climbing stairs? N N  Dressing or bathing? N N  Doing errands, shopping? N N  Preparing Food and eating ? N -  Using the Toilet? N -  In the past six months, have you accidently leaked urine? N -  Do you have problems with loss of bowel control? N -  Managing your Medications? N -  Managing your Finances? N -  Housekeeping or managing your Housekeeping? N -  Some recent data might be hidden    Patient Care Team: Copland, Gay Filler, MD as PCP - General (Family Medicine) Pieter Partridge, DO as Consulting Physician (Neurology)    Assessment:   This is a routine wellness examination for Amanda Saunders. Physical assessment deferred to PCP.  Exercise Activities and Dietary recommendations Current Exercise Habits: The patient does not participate in regular exercise at present, Exercise limited by: None  identified   Diet (meal preparation, eat out, water intake, caffeinated beverages, dairy products, fruits and vegetables): in general, a "healthy" diet  , well balanced, on average, 3 meals per day   Goals    . Increase physical activity     Walk 20 min 3x/week, (use 5 min rule)       Fall Risk Fall Risk  12/17/2018 11/15/2018 10/22/2016 05/14/2016  Falls in the past year? 0 0 No No  Follow up - Falls  evaluation completed - -    Depression Screen PHQ 2/9 Scores 12/17/2018 06/18/2018 10/22/2016  PHQ - 2 Score 0 0 0     Cognitive Function Ad8 score reviewed for issues:  Issues making decisions:no  Less interest in hobbies / activities:no  Repeats questions, stories (family complaining):no  Trouble using ordinary gadgets (microwave, computer, phone):no  Forgets the month or year: no  Mismanaging finances: no  Remembering appts:no  Daily problems with thinking and/or memory:no Ad8 score is=0     MMSE - Mini Mental State Exam 05/14/2016  Orientation to time 5  Orientation to Place 5  Registration 3  Attention/ Calculation 4  Recall 3  Language- name 2 objects 2  Language- repeat 1  Language- follow 3 step command 3  Language- read & follow direction 1  Write a sentence 1  Copy design 1  Total score 29        Immunization History  Administered Date(s) Administered  . Tdap 03/12/2016    Screening Tests Health Maintenance  Topic Date Due  . INFLUENZA VACCINE  02/19/2019  . PAP SMEAR-Modifier  04/03/2019  . MAMMOGRAM  09/21/2020  . COLONOSCOPY  09/03/2021  . TETANUS/TDAP  03/12/2026  . Hepatitis C Screening  Completed  . HIV Screening  Completed      Plan:   See you next year!  Continue to eat heart healthy diet (full of fruits, vegetables, whole grains, lean protein, water--limit salt, fat, and sugar intake) and increase physical activity as tolerated.  Continue doing brain stimulating activities (puzzles, reading, adult coloring books, staying  active) to keep memory sharp.     I have personally reviewed and noted the following in the patient's chart:   . Medical and social history . Use of alcohol, tobacco or illicit drugs  . Current medications and supplements . Functional ability and status . Nutritional status . Physical activity . Advanced directives . List of other physicians . Hospitalizations, surgeries, and ER visits in previous 12 months . Vitals . Screenings to include cognitive, depression, and falls . Referrals and appointments  In addition, I have reviewed and discussed with patient certain preventive protocols, quality metrics, and best practice recommendations. A written personalized care plan for preventive services as well as general preventive health recommendations were provided to patient.     Shela Nevin, [place]  12/17/2018   I have reviewed the above MWE note by Ms. Vevelyn Royals and agree with her documentation Denny Peon MD

## 2018-12-17 ENCOUNTER — Encounter: Payer: Self-pay | Admitting: *Deleted

## 2018-12-17 ENCOUNTER — Ambulatory Visit (INDEPENDENT_AMBULATORY_CARE_PROVIDER_SITE_OTHER): Payer: Medicare Other | Admitting: *Deleted

## 2018-12-17 ENCOUNTER — Other Ambulatory Visit: Payer: Self-pay

## 2018-12-17 DIAGNOSIS — Z0000 Encounter for general adult medical examination without abnormal findings: Secondary | ICD-10-CM

## 2018-12-17 NOTE — Patient Instructions (Signed)
See you next year!  Continue to eat heart healthy diet (full of fruits, vegetables, whole grains, lean protein, water--limit salt, fat, and sugar intake) and increase physical activity as tolerated.  Continue doing brain stimulating activities (puzzles, reading, adult coloring books, staying active) to keep memory sharp.    Amanda Saunders , Thank you for taking time to come for your Medicare Wellness Visit. I appreciate your ongoing commitment to your health goals. Please review the following plan we discussed and let me know if I can assist you in the future.   These are the goals we discussed: Goals    . Increase physical activity     Walk 20 min 3x/week, (use 5 min rule)       This is a list of the screening recommended for you and due dates:  Health Maintenance  Topic Date Due  . Flu Shot  02/19/2019  . Pap Smear  04/03/2019  . Mammogram  09/21/2020  . Colon Cancer Screening  09/03/2021  . Tetanus Vaccine  03/12/2026  .  Hepatitis C: One time screening is recommended by Center for Disease Control  (CDC) for  adults born from [birth year] through 1964.   Completed  . HIV Screening  Completed    Health Maintenance After Age 73 After age 24, you are at a higher risk for certain long-term diseases and infections as well as injuries from falls. Falls are a major cause of broken bones and head injuries in people who are older than age 47. Getting regular preventive care can help to keep you healthy and well. Preventive care includes getting regular testing and making lifestyle changes as recommended by your health care provider. Talk with your health care provider about:  Which screenings and tests you should have. A screening is a test that checks for a disease when you have no symptoms.  A diet and exercise plan that is right for you. What should I know about screenings and tests to prevent falls? Screening and testing are the best ways to find a health problem early. Early diagnosis and  treatment give you the best chance of managing medical conditions that are common after age 62. Certain conditions and lifestyle choices may make you more likely to have a fall. Your health care provider may recommend:  Regular vision checks. Poor vision and conditions such as cataracts can make you more likely to have a fall. If you wear glasses, make sure to get your prescription updated if your vision changes.  Medicine review. Work with your health care provider to regularly review all of the medicines you are taking, including over-the-counter medicines. Ask your health care provider about any side effects that may make you more likely to have a fall. Tell your health care provider if any medicines that you take make you feel dizzy or sleepy.  Osteoporosis screening. Osteoporosis is a condition that causes the bones to get weaker. This can make the bones weak and cause them to break more easily.  Blood pressure screening. Blood pressure changes and medicines to control blood pressure can make you feel dizzy.  Strength and balance checks. Your health care provider may recommend certain tests to check your strength and balance while standing, walking, or changing positions.  Foot health exam. Foot pain and numbness, as well as not wearing proper footwear, can make you more likely to have a fall.  Depression screening. You may be more likely to have a fall if you have a fear of falling,  feel emotionally low, or feel unable to do activities that you used to do.  Alcohol use screening. Using too much alcohol can affect your balance and may make you more likely to have a fall. What actions can I take to lower my risk of falls? General instructions  Talk with your health care provider about your risks for falling. Tell your health care provider if: ? You fall. Be sure to tell your health care provider about all falls, even ones that seem minor. ? You feel dizzy, sleepy, or off-balance.  Take  over-the-counter and prescription medicines only as told by your health care provider. These include any supplements.  Eat a healthy diet and maintain a healthy weight. A healthy diet includes low-fat dairy products, low-fat (lean) meats, and fiber from whole grains, beans, and lots of fruits and vegetables. Home safety  Remove any tripping hazards, such as rugs, cords, and clutter.  Install safety equipment such as grab bars in bathrooms and safety rails on stairs.  Keep rooms and walkways well-lit. Activity   Follow a regular exercise program to stay fit. This will help you maintain your balance. Ask your health care provider what types of exercise are appropriate for you.  If you need a cane or walker, use it as recommended by your health care provider.  Wear supportive shoes that have nonskid soles. Lifestyle  Do not drink alcohol if your health care provider tells you not to drink.  If you drink alcohol, limit how much you have: ? 0-1 drink a day for women. ? 0-2 drinks a day for men.  Be aware of how much alcohol is in your drink. In the [place] one drink equals one typical bottle of beer (12 oz), one-half glass of wine (5 oz), or one shot of hard liquor (1 oz).  Do not use any products that contain nicotine or tobacco, such as cigarettes and e-cigarettes. If you need help quitting, ask your health care provider. Summary  Having a healthy lifestyle and getting preventive care can help to protect your health and wellness after age 46.  Screening and testing are the best way to find a health problem early and help you avoid having a fall. Early diagnosis and treatment give you the best chance for managing medical conditions that are more common for people who are older than age 94.  Falls are a major cause of broken bones and head injuries in people who are older than age 40. Take precautions to prevent a fall at home.  Work with your health care provider to learn what changes  you can make to improve your health and wellness and to prevent falls. This information is not intended to replace advice given to you by your health care provider. Make sure you discuss any questions you have with your health care provider. Document Released: 05/20/2017 Document Revised: 05/20/2017 Document Reviewed: 05/20/2017 Elsevier Interactive Patient Education  2019 [employer].

## 2019-01-13 ENCOUNTER — Telehealth: Payer: Self-pay

## 2019-01-13 NOTE — Telephone Encounter (Signed)

## 2019-01-14 NOTE — Progress Notes (Deleted)
Virtual Visit via Video Note   This visit type was conducted due to national recommendations for restrictions regarding the COVID-19 Pandemic (e.g. social distancing) in an effort to limit this patient's exposure and mitigate transmission in our community.  Due to her co-morbid illnesses, this patient is at least at moderate risk for complications without adequate follow up.  This format is felt to be most appropriate for this patient at this time.  All issues noted in this document were discussed and addressed.  A limited physical exam was performed with this format.  Please refer to the patient's chart for her consent to telehealth for [facility].   Date:  01/14/2019   ID:  Amanda Saunders, DOB 04/30/1956, MRN [id number]  Patient Location:Home Provider Location: Home  PCP:  Darreld Mclean, MD  Cardiologist:  Dr Stanford Breed  Evaluation Performed:  Follow-Up Visit  Chief Complaint:  FU CM  History of Present Illness:    FU cardiomyopathy. 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 Nuclear study November 2017 showed no infarct or ischemia.  LV function mildly reduced.  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  Echocardiogram January 2020 showed ejection fraction 45 to 37%, mild diastolic dysfunction.  Carotid Dopplers January 2020 showed 1 to 39% bilateral stenosis.  Had CVA January 2020.  Since last seen,  The patient does not have symptoms concerning for COVID-19 infection (fever, chills, cough, or new shortness of breath).    Past Medical History:  Diagnosis Date  . Cardiomyopathy ([place])   . Hypertension   . Lupus ([place])   . PUD (peptic ulcer disease)   . Stroke ([place]) 08/13/2018  . Vertigo    Past Surgical History:  Procedure Laterality Date  . BACK SURGERY    .  CARPAL TUNNEL RELEASE    . NECK SURGERY     2015     No outpatient medications have been marked as taking for the 01/17/19 encounter (Appointment) with Lelon Perla, MD.     Allergies:   Codeine   Social History   Tobacco Use  . Smoking status: Former Smoker    Quit date: 07/21/2002    Years since quitting: 16.4  . Smokeless tobacco: Never Used  Substance Use Topics  . Alcohol use: Yes    Comment: occasional  . Drug use: No     Family Hx: The patient's family history includes Arthritis in her mother; Heart failure in her mother; Hypertension in her father and mother; Kidney disease in her father; Stroke in her mother.  ROS:   Please see the history of present illness.    No Fever, chills  or productive cough All other systems reviewed and are negative.   Recent Labs: 02/03/2018: TSH 4.08 08/14/2018: ALT 36; BUN 13; Creatinine, Ser 1.01; Hemoglobin 14.1; Platelets 270; Potassium 3.8; Sodium 141   Recent Lipid Panel Lab Results  Component Value Date/Time   CHOL 136 08/15/2018 04:51 AM   TRIG 105 08/15/2018 04:51 AM   HDL 38 (L) 08/15/2018 04:51 AM   CHOLHDL 3.6 08/15/2018 04:51 AM   LDLCALC 77 08/15/2018 04:51 AM    Wt Readings from Last 3 Encounters:  11/15/18 199 lb (90.3 kg)  09/09/18 202 lb (91.6 kg)  08/14/18 197 lb 15.6 oz (89.8 kg)     Objective:    Vital Signs:  There  were no vitals taken for this visit.   VITAL SIGNS:  reviewed NAD Answers questions appropriately Normal affect Remainder of physical examination not performed (telehealth visit; coronavirus pandemic)  ASSESSMENT & PLAN:    1. 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  We will continue with beta-blocker.  She did not have cardiac MRI performed as ordered at last office visit.  We will reschedule. 2. Cardiomyopathy-LV function mildly reduced.  Continue beta-blocker. 3. Hyperlipidemia-continue statin.  COVID-19 Education: The importance of  social distancing was discussed today.  Time:   Today, I have spent *** minutes with the patient with telehealth technology discussing the above problems.     Medication Adjustments/Labs and Tests Ordered: Current medicines are reviewed at length with the patient today.  Concerns regarding medicines are outlined above.   Tests Ordered: No orders of the defined types were placed in this encounter.   Medication Changes: No orders of the defined types were placed in this encounter.   Follow Up:  {F/U Format:[id number]} {follow up:15908}  Signed, Kirk Ruths, MD  01/14/2019 5:01 PM    [facility]

## 2019-01-17 ENCOUNTER — Telehealth: Payer: Medicare Other | Admitting: Cardiology

## 2019-02-01 ENCOUNTER — Other Ambulatory Visit: Payer: Self-pay | Admitting: *Deleted

## 2019-02-01 DIAGNOSIS — Z20822 Contact with and (suspected) exposure to covid-19: Secondary | ICD-10-CM

## 2019-02-01 NOTE — Progress Notes (Signed)
LAB

## 2019-02-03 NOTE — Progress Notes (Signed)
Virtual Visit via Video Note   This visit type was conducted due to national recommendations for restrictions regarding the COVID-19 Pandemic (e.g. social distancing) in an effort to limit this patient's exposure and mitigate transmission in our community.  Due to her co-morbid illnesses, this patient is at least at moderate risk for complications without adequate follow up.  This format is felt to be most appropriate for this patient at this time.  All issues noted in this document were discussed and addressed.  A limited physical exam was performed with this format.  Please refer to the patient's chart for her consent to telehealth for [facility].   Date:  02/15/2019   ID:  Amanda Saunders, DOB 18-Oct-1955, MRN [id number]  Patient Location:Home Provider Location: Home  PCP:  Darreld Mclean, MD  Cardiologist:  Dr Stanford Breed  Evaluation Performed:  Follow-Up Visit  Chief Complaint:  FU CM  History of Present Illness:    FU cardiomyopathy. 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 Carotid Dopplers November 2017 showed 1-49% bilateral stenosis. Nuclear study November 2017 showed no infarct or ischemia.  LV function mildly reduced.  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  Patient seen in the emergency room August 2019 with chest pain.  Chest x-ray negative.  Troponins normal.  Hemoglobin normal.  Had CVA January 2020.  Echocardiogram January 2020 showed ejection fraction 45 to 50%.  Carotid Dopplers January 2020 showed 1 to 39% bilateral stenosis.  Since last seen,patient denies dyspnea, chest pain, palpitations or syncope.  The patient does not have symptoms concerning for COVID-19 infection (fever, chills, cough, or new shortness of breath).    Past Medical History:   Diagnosis Date  . Cardiomyopathy ([place])   . Hypertension   . Lupus ([place])   . PUD (peptic ulcer disease)   . Stroke ([place]) 08/13/2018  . Vertigo    Past Surgical History:  Procedure Laterality Date  . BACK SURGERY    . CARPAL TUNNEL RELEASE    . NECK SURGERY     2015     Current Meds  Medication Sig  . acetaminophen (TYLENOL) 325 MG tablet Take 2 tablets (650 mg total) by mouth every 4 (four) hours as needed for mild pain (or temp > 37.5 C (99.5 F)).  Marland Kitchen atorvastatin (LIPITOR) 10 MG tablet Take 1 tablet (10 mg total) by mouth daily.  . clopidogrel (PLAVIX) 75 MG tablet Take 1 tablet (75 mg total) by mouth daily.  . hydrochlorothiazide (HYDRODIURIL) 25 MG tablet Take 1 tablet (25 mg total) by mouth every other day.  . hydroxychloroquine (PLAQUENIL) 200 MG tablet Take 200 mg by mouth 2 (two) times daily.  . meclizine (ANTIVERT) 25 MG tablet Take 1 tablet (25 mg total) by mouth 2 (two) times daily as needed for dizziness.  . metoprolol succinate (TOPROL-XL) 25 MG 24 hr tablet Take 1 tablet (25 mg total) by mouth daily.     Allergies:   Codeine   Social History   Tobacco Use  . Smoking status: Former Smoker    Quit date: 07/21/2002    Years since quitting: 16.5  . Smokeless tobacco: Never Used  Substance Use Topics  . Alcohol use: Yes    Comment: occasional  . Drug use: No     Family Hx: The patient's family history includes Arthritis in her  mother; Heart failure in her mother; Hypertension in her father and mother; Kidney disease in her father; Stroke in her mother.  ROS:   Please see the history of present illness.    No Fever, chills  or productive cough All other systems reviewed and are negative.   Recent Labs: 08/14/2018: ALT 36; BUN 13; Creatinine, Ser 1.01; Hemoglobin 14.1; Platelets 270; Potassium 3.8; Sodium 141   Recent Lipid Panel Lab Results  Component Value Date/Time   CHOL 136 08/15/2018 04:51 AM   TRIG 105 08/15/2018 04:51 AM   HDL 38 (L) 08/15/2018  04:51 AM   CHOLHDL 3.6 08/15/2018 04:51 AM   LDLCALC 77 08/15/2018 04:51 AM    Wt Readings from Last 3 Encounters:  02/15/19 196 lb (88.9 kg)  11/15/18 199 lb (90.3 kg)  09/09/18 202 lb (91.6 kg)     Objective:    Vital Signs:  BP 120/80   Pulse 73   Ht 5\' 3"  (1.6 m)   Wt 196 lb (88.9 kg)   BMI 34.72 kg/m    VITAL SIGNS:  reviewed NAD Answers questions appropriately Normal affect Remainder of physical examination not performed (telehealth visit; coronavirus pandemic)  ASSESSMENT & PLAN:    1. Question non-compaction-cardiac MRI ordered at last office visit but not performed.  We will reschedule.  Continue beta-blocker.  If patient found to have non-compaction she would likely need to be on a DOAC.  She would also need to have her first-degree relatives screened.  There is no family history of sudden death and she has no history of syncope. 2. Cardiomyopathy-mildly reduced LV function on most recent echocardiogram.  Continue beta-blocker. 3. Hyperlipidemia-continue statin. 4. Prior CVA-continue aspirin and statin.  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  However if non-compaction noted on MRI would likely need to anticoagulate long-term.  COVID-19 Education: The importance of social distancing was discussed today.  Time:   Today, I have spent 19 minutes with the patient with telehealth technology discussing the above problems.     Medication Adjustments/Labs and Tests Ordered: Current medicines are reviewed at length with the patient today.  Concerns regarding medicines are outlined above.   Tests Ordered: No orders of the defined types were placed in this encounter.   Medication Changes: No orders of the defined types were placed in this encounter.   Follow Up:  Virtual Visit or In Person in 6 month(s)  Signed, Kirk Ruths, MD  02/15/2019 11:22 AM    [place]

## 2019-02-06 LAB — NOVEL CORONAVIRUS, NAA: SARS-CoV-2, NAA: NOT DETECTED

## 2019-02-15 ENCOUNTER — Telehealth: Payer: Self-pay | Admitting: Cardiology

## 2019-02-15 ENCOUNTER — Telehealth (INDEPENDENT_AMBULATORY_CARE_PROVIDER_SITE_OTHER): Payer: Medicare Other | Admitting: Cardiology

## 2019-02-15 VITALS — BP 120/80 | HR 73 | Ht 63.0 in | Wt 196.0 lb

## 2019-02-15 DIAGNOSIS — I10 Essential (primary) hypertension: Secondary | ICD-10-CM | POA: Diagnosis not present

## 2019-02-15 DIAGNOSIS — E7800 Pure hypercholesterolemia, unspecified: Secondary | ICD-10-CM | POA: Diagnosis not present

## 2019-02-15 DIAGNOSIS — I428 Other cardiomyopathies: Secondary | ICD-10-CM

## 2019-02-15 NOTE — Telephone Encounter (Signed)
LVM, reminding pt of her appt on 02-15-19.

## 2019-02-15 NOTE — Patient Instructions (Signed)
Medication Instructions:  NO CHANGE If you need a refill on your cardiac medications before your next appointment, please call your pharmacy.   Lab work: If you have labs (blood work) drawn today and your tests are completely normal, you will receive your results only by: Marland Kitchen MyChart Message (if you have MyChart) OR . A paper copy in the mail If you have any lab test that is abnormal or we need to change your treatment, we will call you to review the results.  Testing/Procedures: Your physician has requested that you have a cardiac MRI. Cardiac MRI uses a computer to create images of your heart as its beating, producing both still and moving pictures of your heart and major blood vessels. For further information please visit [email or web address] Please follow the instruction sheet given to you today for more information.WILL BE SCHEDULED AT [facility]   Follow-Up: At [facility], you and your health needs are our priority.  As part of our continuing mission to provide you with exceptional heart care, we have created designated Provider Care Teams.  These Care Teams include your primary Cardiologist (physician) and Advanced Practice Providers (APPs -  Physician Assistants and Nurse Practitioners) who all work together to provide you with the care you need, when you need it. You will need a follow up appointment in 6 months.  Please call our office 2 months in advance to schedule this appointment.  You may see  or one of the following Advanced Practice Providers on your designated Care Team:   Kerin Ransom, PA-C Roby Lofts, [place] . Sande Rives, PA-C

## 2019-02-17 ENCOUNTER — Encounter: Payer: Self-pay | Admitting: Family Medicine

## 2019-02-17 ENCOUNTER — Encounter: Payer: Self-pay | Admitting: Cardiology

## 2019-02-17 DIAGNOSIS — R609 Edema, unspecified: Secondary | ICD-10-CM

## 2019-02-17 MED ORDER — HYDROCHLOROTHIAZIDE 25 MG PO TABS: 25.0000 mg | ORAL_TABLET | ORAL | 3 refills | Status: DC

## 2019-02-24 DIAGNOSIS — A689 Relapsing fever, unspecified: Secondary | ICD-10-CM | POA: Diagnosis not present

## 2019-02-24 DIAGNOSIS — M2550 Pain in unspecified joint: Secondary | ICD-10-CM | POA: Diagnosis not present

## 2019-02-24 DIAGNOSIS — M7989 Other specified soft tissue disorders: Secondary | ICD-10-CM | POA: Diagnosis not present

## 2019-02-24 DIAGNOSIS — M329 Systemic lupus erythematosus, unspecified: Secondary | ICD-10-CM | POA: Diagnosis not present

## 2019-02-24 DIAGNOSIS — R768 Other specified abnormal immunological findings in serum: Secondary | ICD-10-CM | POA: Diagnosis not present

## 2019-02-25 ENCOUNTER — Other Ambulatory Visit: Payer: Self-pay | Admitting: *Deleted

## 2019-02-25 DIAGNOSIS — E785 Hyperlipidemia, unspecified: Secondary | ICD-10-CM

## 2019-02-25 MED ORDER — ATORVASTATIN CALCIUM 10 MG PO TABS: 10.0000 mg | ORAL_TABLET | Freq: Every day | ORAL | 3 refills | Status: DC

## 2019-03-01 ENCOUNTER — Telehealth (HOSPITAL_COMMUNITY): Payer: Self-pay | Admitting: Emergency Medicine

## 2019-03-01 ENCOUNTER — Telehealth: Payer: Self-pay | Admitting: Cardiovascular Disease

## 2019-03-01 MED ORDER — DIAZEPAM 5 MG PO TABS
5.0000 mg | ORAL_TABLET | Freq: Once | ORAL | Status: AC
  Administered 2019-03-02: 15:00:00 5 mg via ORAL
  Filled 2019-03-01: qty 1

## 2019-03-01 NOTE — Telephone Encounter (Signed)
Returned call to patient.Advised Dr.Crenshaw out of office this week.Advised her head will not be enclosed in cardiac ct scanner.Patient reassured.She will keep appointment as planned.

## 2019-03-01 NOTE — Telephone Encounter (Signed)
Reaching out to patient to offer assistance regarding upcoming cardiac imaging study; pt verbalizes understanding of appt date/time, parking situation and where to check in, and verified current allergies; name and call back number provided for further questions should they arise Marchia Bond RN [place] [facility] [phone] office [phone] cell  Pt denies covid symptoms, verbalized understanding of visitor policy.  Pt states she is claustrophobic, I was able to reach out to [facility] to get verbal order for antianxiety medication prior to test.

## 2019-03-01 NOTE — Telephone Encounter (Signed)
New Message    Patient scheduled to get cardiac MRI 03/02/19 @ 3p and states she's claustrophobic so she will need a sedative to be able to complete appointment.  Please call patient back.

## 2019-03-02 ENCOUNTER — Other Ambulatory Visit: Payer: Self-pay

## 2019-03-02 ENCOUNTER — Ambulatory Visit (HOSPITAL_COMMUNITY)
Admission: RE | Admit: 2019-03-02 | Discharge: 2019-03-02 | Disposition: A | Discharge status: Home / Self Care | Payer: Medicare Other | Source: Ambulatory Visit | Attending: Cardiology | Admitting: Cardiology

## 2019-03-02 DIAGNOSIS — I428 Other cardiomyopathies: Secondary | ICD-10-CM

## 2019-03-02 LAB — CREATININE, SERUM
Creatinine, Ser: 0.84 mg/dL (ref 0.44–1.00)
GFR calc Af Amer: >60 mL/min (ref >60)
GFR calc non Af Amer: >60 mL/min (ref >60)

## 2019-03-02 MED ORDER — GADOBUTROL 1 MMOL/ML IV SOLN
10.0000 mL | Freq: Once | INTRAVENOUS | Status: AC | PRN
  Administered 2019-03-02: 10 mL via INTRAVENOUS

## 2019-03-02 MED ORDER — DIAZEPAM 5 MG PO TABS
ORAL_TABLET | ORAL | Status: AC
  Administered 2019-03-02: 5 mg via ORAL
  Filled 2019-03-02: qty 1

## 2019-03-03 ENCOUNTER — Telehealth: Payer: Self-pay | Admitting: *Deleted

## 2019-03-03 MED ORDER — APIXABAN 5 MG PO TABS: 5.0000 mg | ORAL_TABLET | Freq: Two times a day (BID) | ORAL | 6 refills | Status: DC

## 2019-03-03 NOTE — Telephone Encounter (Addendum)
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 Patient voiced understanding to stop plavix. New script sent to the pharmacy

## 2019-03-03 NOTE — Progress Notes (Signed)
Virtual Visit via Video Note   This visit type was conducted due to national recommendations for restrictions regarding the COVID-19 Pandemic (e.g. social distancing) in an effort to limit this patient's exposure and mitigate transmission in our community.  Due to her co-morbid illnesses, this patient is at least at moderate risk for complications without adequate follow up.  This format is felt to be most appropriate for this patient at this time.  All issues noted in this document were discussed and addressed.  A limited physical exam was performed with this format.  Please refer to the patient's chart for her consent to telehealth for [facility].   Date:  03/04/2019   ID:  Amanda Saunders, DOB 09-14-1955, MRN [id number]  Patient Location:Home Provider Location: Home  PCP:  Darreld Mclean, MD  Cardiologist:  Dr Stanford Breed  Evaluation Performed:  Follow-Up Visit  Chief Complaint:  FU cardiomyopathy   History of Present Illness:    FUcardiomyopathy. Nuclear study November 2017 showed no infarct or ischemia. LV function mildly reduced. 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 Had CVA January 2020.  Echocardiogram January 2020 showed ejection fraction 45 to 50%.  Carotid Dopplers January 2020 showed 1 to 39% bilateral stenosis.  Cardiac MRI 8/20 showed EF 44 and findings c/w noncompaction, mild LAE, mild AI and MR. Since last seen,she has occasional dyspnea and pedal edema which is longstanding.  No chest pain, palpitations or syncope.  The patient does not have symptoms concerning for COVID-19 infection (fever, chills, cough, or new shortness of breath).    Past Medical History:  Diagnosis Date   Cardiomyopathy ([place])    Hypertension    Lupus ([place])    PUD (peptic ulcer disease)    Stroke ([place]) 08/13/2018   Vertigo    Past Surgical History:    Procedure Laterality Date   BACK SURGERY     CARPAL TUNNEL RELEASE     NECK SURGERY     2015     Current Meds  Medication Sig   acetaminophen (TYLENOL) 325 MG tablet Take 2 tablets (650 mg total) by mouth every 4 (four) hours as needed for mild pain (or temp > 37.5 C (99.5 F)).   atorvastatin (LIPITOR) 10 MG tablet Take 1 tablet (10 mg total) by mouth daily.   hydrochlorothiazide (HYDRODIURIL) 25 MG tablet Take 1 tablet (25 mg total) by mouth every other day.   hydroxychloroquine (PLAQUENIL) 200 MG tablet Take 200 mg by mouth 2 (two) times daily.   meclizine (ANTIVERT) 25 MG tablet Take 1 tablet (25 mg total) by mouth 2 (two) times daily as needed for dizziness.   metoprolol succinate (TOPROL-XL) 25 MG 24 hr tablet Take 1 tablet (25 mg total) by mouth daily.     Allergies:   Codeine   Social History   Tobacco Use   Smoking status: Former Smoker    Quit date: 07/21/2002    Years since quitting: 16.6   Smokeless tobacco: Never Used  Substance Use Topics   Alcohol use: Yes    Comment: occasional   Drug use: No     Family Hx: The patient's family history includes Arthritis in her mother; Heart failure in her mother; Hypertension in her father and mother; Kidney disease in her father; Stroke in her mother.  ROS:   Please see the history of present illness.    No Fever, chills  or productive cough  All other systems reviewed and are negative.   Recent Labs: 08/14/2018: ALT 36; BUN 13; Hemoglobin 14.1; Platelets 270; Potassium 3.8; Sodium 141 03/02/2019: Creatinine, Ser 0.84   Recent Lipid Panel Lab Results  Component Value Date/Time   CHOL 136 08/15/2018 04:51 AM   TRIG 105 08/15/2018 04:51 AM   HDL 38 (L) 08/15/2018 04:51 AM   CHOLHDL 3.6 08/15/2018 04:51 AM   LDLCALC 77 08/15/2018 04:51 AM    Wt Readings from Last 3 Encounters:  03/04/19 199 lb (90.3 kg)  02/15/19 196 lb (88.9 kg)  11/15/18 199 lb (90.3 kg)     Objective:    Vital Signs:  BP 132/72     Pulse 79    Ht 5\' 3"  (1.6 m)    Wt 199 lb (90.3 kg)    BMI 35.25 kg/m    VITAL SIGNS:  reviewed NAD Answers questions appropriately Normal affect Remainder of physical examination not performed (telehealth visit; coronavirus pandemic)  ASSESSMENT & PLAN:    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  Ejection fraction 44%.  We will continue beta-blocker.  Add losartan 50 mg daily.  Check potassium and renal function in 1 week.  She did have a CVA previously.  This could have been secondary to intracranial vascular disease but question embolic event related to non-compaction.  Will treat with apixaban 5 mg BID. She will need follow-up echocardiograms in the future.  She does not have a history of syncope or sudden death.  I have discussed with her that her first-degree relatives need screening. 2. 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  Previous nuclear study negative.  Continue beta-blocker.  Add losartan 50 mg daily. 3. Hyperlipidemia-continue statin. 4. Prior CVA-as outlined above this may have been related to intracranial vascular disease but given non-compaction I am also concerned about embolic event.  We have elected to treat with apixaban and antiplatelet therapy has been discontinued.  COVID-19 Education: The importance of social distancing was discussed today.  Time:   Today, I have spent 18 minutes with the patient with telehealth technology discussing the above problems.     Medication Adjustments/Labs and Tests Ordered: Current medicines are reviewed at length with the patient today.  Concerns regarding medicines are outlined above.   Tests Ordered: No orders of the defined types were placed in this encounter.   Medication Changes: No orders of the defined types were placed in this encounter.   Follow Up:  Virtual Visit or In Person in 6 month(s)  Signed, Kirk Ruths, MD  03/04/2019 7:54 AM    [place]

## 2019-03-04 ENCOUNTER — Encounter: Payer: Self-pay | Admitting: Family Medicine

## 2019-03-04 ENCOUNTER — Encounter: Payer: Self-pay | Admitting: Cardiology

## 2019-03-04 ENCOUNTER — Other Ambulatory Visit: Payer: Self-pay

## 2019-03-04 ENCOUNTER — Telehealth (INDEPENDENT_AMBULATORY_CARE_PROVIDER_SITE_OTHER): Payer: Medicare Other | Admitting: Cardiology

## 2019-03-04 VITALS — BP 132/72 | HR 79 | Ht 63.0 in | Wt 199.0 lb

## 2019-03-04 DIAGNOSIS — R42 Dizziness and giddiness: Secondary | ICD-10-CM

## 2019-03-04 DIAGNOSIS — I428 Other cardiomyopathies: Secondary | ICD-10-CM

## 2019-03-04 DIAGNOSIS — M25562 Pain in left knee: Secondary | ICD-10-CM

## 2019-03-04 DIAGNOSIS — E7800 Pure hypercholesterolemia, unspecified: Secondary | ICD-10-CM

## 2019-03-04 MED ORDER — LOSARTAN POTASSIUM 50 MG PO TABS: 50.0000 mg | ORAL_TABLET | Freq: Every day | ORAL | 3 refills | Status: DC

## 2019-03-04 NOTE — Patient Instructions (Signed)
Medication Instructions:  START LOSARTAN 50 MG ONCE DAILY  If you need a refill on your cardiac medications before your next appointment, please call your pharmacy.   Lab work: Your physician recommends that you return for lab work in: [place]   If you have labs (blood work) drawn today and your tests are completely normal, you will receive your results only by: Marland Kitchen MyChart Message (if you have MyChart) OR . A paper copy in the mail If you have any lab test that is abnormal or we need to change your treatment, we will call you to review the results.  Follow-Up: At [facility], you and your health needs are our priority.  As part of our continuing mission to provide you with exceptional heart care, we have created designated Provider Care Teams.  These Care Teams include your primary Cardiologist (physician) and Advanced Practice Providers (APPs -  Physician Assistants and Nurse Practitioners) who all work together to provide you with the care you need, when you need it. You will need a follow up appointment in 6 months.  Please call our office 2 months in advance to schedule this appointment.  You may see Kirk Ruths MD or one of the following Advanced Practice Providers on your designated Care Team:   Kerin Ransom, PA-C Roby Lofts, [place] . Sande Rives, PA-C

## 2019-03-05 ENCOUNTER — Other Ambulatory Visit: Payer: Self-pay | Admitting: Family Medicine

## 2019-03-05 DIAGNOSIS — R42 Dizziness and giddiness: Secondary | ICD-10-CM

## 2019-03-05 MED ORDER — TRAMADOL HCL 50 MG PO TABS: 50.0000 mg | ORAL_TABLET | Freq: Three times a day (TID) | ORAL | 0 refills | Status: DC | PRN

## 2019-03-05 MED ORDER — MECLIZINE HCL 25 MG PO TABS: 25.0000 mg | ORAL_TABLET | Freq: Two times a day (BID) | ORAL | 2 refills | Status: DC | PRN

## 2019-03-07 MED ORDER — MECLIZINE HCL 25 MG PO TABS: ORAL_TABLET | ORAL | 5 refills | Status: DC

## 2019-03-07 NOTE — Addendum Note (Signed)
Addended by: Wynonia Musty A on: 03/07/2019 09:05 AM   Modules accepted: Orders

## 2019-03-10 ENCOUNTER — Telehealth: Payer: Self-pay | Admitting: Neurology

## 2019-03-10 ENCOUNTER — Encounter: Payer: Self-pay | Admitting: Family Medicine

## 2019-03-10 ENCOUNTER — Telehealth: Payer: Self-pay | Admitting: Cardiology

## 2019-03-10 NOTE — Telephone Encounter (Signed)
This is a new issue.  If she is 84 and has never had a migraine and she is now having worst headache of her life (rating it 11/10) then she needs to go to the ED to rule out any serious causes.

## 2019-03-10 NOTE — Telephone Encounter (Signed)
° °  New Message:  Patient called back in regards to a letter she received about having blood work done. She would like to know when she should have the blood work done. Please advise

## 2019-03-10 NOTE — Telephone Encounter (Signed)
Patient called in with first migraine ever (has had for one week since last Thursday night 8/13). Sensitive to light and noise. Pain rated 11 (0-10 scale). Pain on the left side of her head. Has tried the following for her headache with no relief: arthritis strength tylenol, tramadol, meclizine.  Last virtual visit with Dr. Tomi Likens was 4/28 for follow up stroke. Patient states she has never had a migraine before.  Last week - Patient has been donating plasma 2x week for past month. Has never had a headache afterwards. Plasma donation this past Tues and Thurs and "was fine".  Had cardiac MRI with contrast last Wednesday and no problem after that.  Cardiology appt with Dr. Stanford Breed last Friday 8/14 and he stopped plavix and started her on Eliquis (started Saturday). Also started injection (methotrate) for her lupus from Rheumatologist Dr. Marella Chimes and gave herself her first dose on Sunday 8/23.   Patient contacted her cardiologist yesterday with these same symptoms and she was referred to her PCP who she also has contacted this am.  Patient is concerned about taking any of her medications today as she thinks they might be interacting with each other causing the migraine (the two new meds last week were started 2-3 days AFTER the migraine started). She wanted to ask Dr. Tomi Likens what to do/ what medication she might be able to take.

## 2019-03-10 NOTE — Telephone Encounter (Signed)
Patient is having a migraine and it has been going on since last week she states that lights and sound she can not take and it is on her left side and going back to the back of her head. Please call

## 2019-03-10 NOTE — Telephone Encounter (Signed)
Spoke to patient and relayed information from Dr. Tomi Likens to go to the ER as this migraine is a new issue/ranked 11/10 worst headache of life. Explained to patient the importance of going to the ER now was to rule out any serious causes. Patient verbalized understanding and said her son could take her now.

## 2019-03-10 NOTE — Telephone Encounter (Signed)
Spoke to patient advised bmet is to be done at our office [facility].Stated she will try and have done next week.She continues to have a headache everyday and a low grade fever.Stated she tested negative for covid last month,but she has a call into PCP to ask to be retested.

## 2019-03-11 ENCOUNTER — Encounter (HOSPITAL_COMMUNITY): Payer: Self-pay

## 2019-03-11 ENCOUNTER — Emergency Department (HOSPITAL_COMMUNITY): Payer: Medicare Other

## 2019-03-11 ENCOUNTER — Emergency Department (HOSPITAL_COMMUNITY)
Admission: EM | Admit: 2019-03-11 | Discharge: 2019-03-11 | Disposition: A | Discharge status: Home / Self Care | Payer: Medicare Other | Attending: Emergency Medicine | Admitting: Emergency Medicine

## 2019-03-11 ENCOUNTER — Other Ambulatory Visit: Payer: Self-pay

## 2019-03-11 DIAGNOSIS — Z87891 Personal history of nicotine dependence: Secondary | ICD-10-CM | POA: Diagnosis not present

## 2019-03-11 DIAGNOSIS — I10 Essential (primary) hypertension: Secondary | ICD-10-CM | POA: Insufficient documentation

## 2019-03-11 DIAGNOSIS — R51 Headache: Secondary | ICD-10-CM | POA: Insufficient documentation

## 2019-03-11 DIAGNOSIS — R519 Headache, unspecified: Secondary | ICD-10-CM

## 2019-03-11 DIAGNOSIS — Z7901 Long term (current) use of anticoagulants: Secondary | ICD-10-CM | POA: Insufficient documentation

## 2019-03-11 DIAGNOSIS — M3210 Systemic lupus erythematosus, organ or system involvement unspecified: Secondary | ICD-10-CM | POA: Insufficient documentation

## 2019-03-11 DIAGNOSIS — Z79899 Other long term (current) drug therapy: Secondary | ICD-10-CM | POA: Diagnosis not present

## 2019-03-11 LAB — CBC WITH DIFFERENTIAL/PLATELET
Abs Immature Granulocytes: 0.02 10*3/uL (ref 0.00–0.07)
Basophils Absolute: 0 10*3/uL (ref 0.0–0.1)
Basophils Relative: 0 %
Eosinophils Absolute: 0 10*3/uL (ref 0.0–0.5)
Eosinophils Relative: 0 %
HCT: 39.4 % (ref 36.0–46.0)
Hemoglobin: 12.8 g/dL (ref 12.0–15.0)
Immature Granulocytes: 0 %
Lymphocytes Relative: 12 %
Lymphs Abs: 0.7 10*3/uL (ref 0.7–4.0)
MCH: 28.4 pg (ref 26.0–34.0)
MCHC: 32.5 g/dL (ref 30.0–36.0)
MCV: 87.6 fL (ref 80.0–100.0)
Monocytes Absolute: 0.6 10*3/uL (ref 0.1–1.0)
Monocytes Relative: 10 %
Neutro Abs: 4.6 10*3/uL (ref 1.7–7.7)
Neutrophils Relative %: 78 %
Platelets: 219 10*3/uL (ref 150–400)
RBC: 4.5 MIL/uL (ref 3.87–5.11)
RDW: 13.9 % (ref 11.5–15.5)
WBC: 6 10*3/uL (ref 4.0–10.5)
nRBC: 0 % (ref 0.0–0.2)

## 2019-03-11 LAB — BASIC METABOLIC PANEL
Anion gap: 7 (ref 5–15)
BUN: 9 mg/dL (ref 8–23)
CO2: 25 mmol/L (ref 22–32)
Calcium: 8.4 mg/dL — ABNORMAL LOW (ref 8.9–10.3)
Chloride: 106 mmol/L (ref 98–111)
Creatinine, Ser: 1.01 mg/dL — ABNORMAL HIGH (ref 0.44–1.00)
GFR calc Af Amer: >60 mL/min (ref >60)
GFR calc non Af Amer: >60 mL/min (ref >60)
Glucose, Bld: 105 mg/dL — ABNORMAL HIGH (ref 70–99)
Potassium: 3.8 mmol/L (ref 3.5–5.1)
Sodium: 138 mmol/L (ref 135–145)

## 2019-03-11 MED ORDER — METOCLOPRAMIDE HCL 5 MG/ML IJ SOLN
10.0000 mg | Freq: Once | INTRAMUSCULAR | Status: AC
  Administered 2019-03-11: 10 mg via INTRAVENOUS
  Filled 2019-03-11: qty 2

## 2019-03-11 MED ORDER — DIPHENHYDRAMINE HCL 50 MG/ML IJ SOLN
25.0000 mg | Freq: Once | INTRAMUSCULAR | Status: AC
  Administered 2019-03-11: 25 mg via INTRAVENOUS
  Filled 2019-03-11: qty 1

## 2019-03-11 NOTE — ED Provider Notes (Signed)
[facility] EMERGENCY DEPARTMENT Provider Note   CSN: [id number] Arrival date & time: 03/11/19  [id number]     History   Chief Complaint No chief complaint on file.   HPI Amanda Saunders is a 63 y.o. female.     Patient with past medical history of ischemic stroke with residual right hand weakness currently on Eliquis, history of lupus on Plaquenil and methotrexate, hypertension --presents to the emergency department with progressively worsening severe headache.  Today is day 9 of symptoms.  Patient reports a more intermittent mild headache at onset which was controlled with Tylenol.  However, over the past several days the pain has become more persistent and worsened.  It is on the right front part of the head.  Patient has associated light and sound sensitivity.  She states that she had one episode of vomiting several days ago.  No visual changes or loss.  No weakness, numbness, or tingling in her extremities.  She states that she had to use a motorized wheelchair when shopping the other day and typically she can walk.  She was able to walk she just felt unsteady.  She denies any preceding traumas or injuries.  Patient states that she spoke with her neurologist yesterday and they recommended that she come to the emergency department for further evaluation.  She reports one fever to 101 last night, otherwise no fevers or other infectious symptoms.      Past Medical History:  Diagnosis Date  . Cardiomyopathy ([place])   . Hypertension   . Lupus ([place])   . PUD (peptic ulcer disease)   . Stroke ([place]) 08/13/2018  . Vertigo     Patient Active Problem List   Diagnosis Date Noted  . Hospital discharge follow-up 09/07/2018  . Acute ischemic stroke ([place]) 08/14/2018  . HTN (hypertension) 08/14/2018  . Pre-diabetes 04/03/2016  . History of CVA (cerebrovascular accident) 03/12/2016  . Chest pain at rest 03/12/2016  . Peripheral edema 03/12/2016    Past Surgical History:   Procedure Laterality Date  . BACK SURGERY    . CARPAL TUNNEL RELEASE    . NECK SURGERY     2015     OB History   No obstetric history on file.      Home Medications    Prior to Admission medications   Medication Sig Start Date End Date Taking? Authorizing Provider  acetaminophen (TYLENOL) 325 MG tablet Take 2 tablets (650 mg total) by mouth every 4 (four) hours as needed for mild pain (or temp > 37.5 C (99.5 F)). 08/17/18   Domenic Polite, MD  apixaban (ELIQUIS) 5 MG TABS tablet Take 1 tablet (5 mg total) by mouth 2 (two) times daily. Patient not taking: Reported on 03/04/2019 03/03/19   Lelon Perla, MD  atorvastatin (LIPITOR) 10 MG tablet Take 1 tablet (10 mg total) by mouth daily. 02/25/19   Copland, Gay Filler, MD  hydrochlorothiazide (HYDRODIURIL) 25 MG tablet Take 1 tablet (25 mg total) by mouth every other day. 02/17/19   Copland, Gay Filler, MD  hydroxychloroquine (PLAQUENIL) 200 MG tablet Take 200 mg by mouth 2 (two) times daily. 05/21/18   [provider]  losartan (COZAAR) 50 MG tablet Take 1 tablet (50 mg total) by mouth daily. 03/04/19 06/02/19  Lelon Perla, MD  meclizine (ANTIVERT) 25 MG tablet Take 1 tablet (25 mg total) by mouth 2 (two) times daily as needed for dizziness. 03/05/19   Copland, Gay Filler, MD  meclizine (ANTIVERT) 25  MG tablet TAKE 1 TABLET BY MOUTH TWICE DAILY AS NEEDED FOR DIZZINESS 03/07/19   Copland, Gay Filler, MD  metoprolol succinate (TOPROL-XL) 25 MG 24 hr tablet Take 1 tablet (25 mg total) by mouth daily. 01/18/18   Copland, Gay Filler, MD  traMADol (ULTRAM) 50 MG tablet Take 1 tablet (50 mg total) by mouth every 8 (eight) hours as needed. 03/05/19   Copland, Gay Filler, MD    Family History Family History  Problem Relation Age of Onset  . Hypertension Mother   . Arthritis Mother   . Heart failure Mother   . Stroke Mother   . Kidney disease Father   . Hypertension Father     Social History Social History   Tobacco Use  . Smoking  status: Former Smoker    Quit date: 07/21/2002    Years since quitting: 16.6  . Smokeless tobacco: Never Used  Substance Use Topics  . Alcohol use: Yes    Comment: occasional  . Drug use: No     Allergies   Codeine   Review of Systems Review of Systems  Constitutional: Negative for fever.  HENT: Negative for congestion, dental problem, rhinorrhea and sinus pressure.   Eyes: Negative for photophobia, discharge, redness and visual disturbance.  Respiratory: Negative for shortness of breath.   Cardiovascular: Negative for chest pain.  Gastrointestinal: Negative for nausea and vomiting (one episode 5 days ago, otherwise no).  Musculoskeletal: Positive for gait problem (unsteady). Negative for neck pain and neck stiffness.  Skin: Negative for rash.  Neurological: Positive for headaches. Negative for syncope, speech difficulty, weakness, light-headedness and numbness.  Psychiatric/Behavioral: Negative for confusion.     Physical Exam Updated Vital Signs BP 122/82 (BP Location: Right Arm)   Pulse 77   Temp 98.2 F (36.8 C) (Oral)   Resp 18   Ht 5\' 3"  (1.6 m)   Wt 90.3 kg   SpO2 100%   BMI 35.25 kg/m   Physical Exam Vitals signs and nursing note reviewed.  Constitutional:      Appearance: She is well-developed.  HENT:     Head: Normocephalic and atraumatic.     Right Ear: Tympanic membrane, ear canal and external ear normal.     Left Ear: Tympanic membrane, ear canal and external ear normal.     Nose: Nose normal.     Mouth/Throat:     Pharynx: Uvula midline.  Eyes:     General: Lids are normal.     Extraocular Movements:     Right eye: No nystagmus.     Left eye: No nystagmus.     Conjunctiva/sclera: Conjunctivae normal.     Pupils: Pupils are equal, round, and reactive to light.  Neck:     Musculoskeletal: Normal range of motion and neck supple.     Comments: Patient with full range of motion of the neck without any difficulty.  She does report some pain in the  back of her neck with movement however this does not cause her any distress.  No clear meningismus. Cardiovascular:     Rate and Rhythm: Normal rate and regular rhythm.  Pulmonary:     Effort: Pulmonary effort is normal.     Breath sounds: Normal breath sounds.  Abdominal:     Palpations: Abdomen is soft.     Tenderness: There is no abdominal tenderness.  Musculoskeletal:     Cervical back: She exhibits normal range of motion, no tenderness and no bony tenderness.  Skin:  General: Skin is warm and dry.  Neurological:     Mental Status: She is alert and oriented to person, place, and time.     GCS: GCS eye subscore is 4. GCS verbal subscore is 5. GCS motor subscore is 6.     Cranial Nerves: No cranial nerve deficit.     Sensory: No sensory deficit.     Coordination: Coordination normal.     Gait: Gait normal.     Deep Tendon Reflexes: Reflexes are normal and symmetric.      ED Treatments / Results  Labs (all labs ordered are listed, but only abnormal results are displayed) Labs Reviewed  BASIC METABOLIC PANEL - Abnormal; Notable for the following components:      Result Value   Glucose, Bld 105 (*)    Creatinine, Ser 1.01 (*)    Calcium 8.4 (*)    All other components within normal limits  CBC WITH DIFFERENTIAL/PLATELET    EKG None  Radiology Ct Head Wo Contrast  Result Date: 03/11/2019 CLINICAL DATA:  Headache. EXAM: CT HEAD WITHOUT CONTRAST TECHNIQUE: Contiguous axial images were obtained from the base of the skull through the vertex without intravenous contrast. COMPARISON:  CT scan of August 14, 2018. FINDINGS: Brain: Mild chronic ischemic white matter disease is noted. No mass effect or midline shift is noted. Ventricular size is within normal limits. There is no evidence of mass lesion, hemorrhage or acute infarction. Vascular: No hyperdense vessel or unexpected calcification. Skull: Normal. Negative for fracture or focal lesion. Sinuses/Orbits: No acute finding.  Other: None. IMPRESSION: Mild chronic ischemic white matter disease. No acute intracranial abnormality seen. Electronically Signed   By: Marijo Conception M.D.   On: 03/11/2019 08:42    Procedures Procedures (including critical care time)  Medications Ordered in ED Medications  metoCLOPramide (REGLAN) injection 10 mg (10 mg Intravenous Given 03/11/19 0807)  diphenhydrAMINE (BENADRYL) injection 25 mg (25 mg Intravenous Given 03/11/19 0810)     Initial Impression / Assessment and Plan / ED Course  I have reviewed the triage vital signs and the nursing notes.  Pertinent labs & imaging results that were available during my care of the patient were reviewed by me and considered in my medical decision making (see chart for details).        Patient seen and examined. Work-up initiated. Plans: HA cocktail, labs, CT head.  Low concern for meningitis at this time. Do not feel she would need LP if CT head neg. No thunderclap, no neuro deficits, no meningismus. Discussed with Dr. Sedonia Small who has seen.    Vital signs reviewed and are as follows: BP 122/82 (BP Location: Right Arm)   Pulse 77   Temp 98.2 F (36.8 C) (Oral)   Resp 18   Ht 5\' 3"  (1.6 m)   Wt 90.3 kg   SpO2 100%   BMI 35.25 kg/m   9:37 AM CT imaging reviewed by myself.  No acute findings.  Patient rechecked and states that her headache is nearly resolved.  She is actively requesting discharged home.  She will continue use of home medications for headache.  Strongly encouraged follow-up with her neurologist for further evaluation.   No decompensation neurologically during ED stay.   Patient counseled to return if they have weakness in their arms or legs, slurred speech, trouble walking or talking, confusion, trouble with their balance, or if they have any other concerns. Patient verbalizes understanding and agrees with plan.    Final Clinical Impressions(s) /  ED Diagnoses   Final diagnoses:  Acute nonintractable headache,  unspecified headache type   Pt with ongoing HA, gradual onset, not improving at home. She has h/o CVA and is anticoagulated. Patient without high-risk features of headache including: sudden onset/thunderclap HA, altered mental status, accompanying seizure, headache with exertion, neck or shoulder pain, fever, family history of spontaneous SAH, concomitant drug use, toxic exposure.   Patient has a normal complete neurological exam, normal vital signs, normal level of consciousness, no signs of meningismus, is well-appearing/non-toxic appearing, no signs of trauma, no pain over the temporal arteries.   Imaging with CT/MRI performed given risk factor profile. No bleeding seen. Doubt occult SAH given history and exam.   No dangerous or life-threatening conditions suspected or identified by history, physical exam, and by work-up. No indications for hospitalization identified.    ED Discharge Orders    None       Carlisle Cater, [place] 03/11/19 [id number]    Maudie Flakes, MD 03/14/19 1105

## 2019-03-11 NOTE — ED Triage Notes (Signed)
Pt has been having a Rt-sided headache since last Thursday (03/03/19) She is light sensitive & to sound, as well as nausea. Her PCP recommended she come get seen due to her Hx of stroke on the Rt side in the past. No OTC meds alleviated any symptoms so far.

## 2019-03-11 NOTE — ED Notes (Signed)
Patient transported to CT 

## 2019-03-11 NOTE — Discharge Instructions (Signed)
Please read and follow all provided instructions.  Your diagnoses today include:  1. Acute nonintractable headache, unspecified headache type     Tests performed today include:  CT of your head which was normal and did not show any serious cause of your headache  Blood counts and electrolytes  Vital signs. See below for your results today.   Medications:  In the Emergency Department you received:  Reglan - antinausea/headache medication  Benadryl - antihistamine to counteract potential side effects of reglan  Take any prescribed medications only as directed.  Additional information:  Follow any educational materials contained in this packet.  You are having a headache. No specific cause was found today for your headache. It may have been a migraine or other cause of headache. Stress, anxiety, fatigue, and depression are common triggers for headaches.   Your headache today does not appear to be life-threatening or require hospitalization, but often the exact cause of headaches is not determined in the emergency department. Therefore, follow-up with your doctor is very important to find out what may have caused your headache and whether or not you need any further diagnostic testing or treatment.   Sometimes headaches can appear benign (not harmful), but then more serious symptoms can develop which should prompt an immediate re-evaluation by your doctor or the emergency department.  BE VERY CAREFUL not to take multiple medicines containing Tylenol (also called acetaminophen). Doing so can lead to an overdose which can damage your liver and cause liver failure and possibly death.   Follow-up instructions: Please follow-up with your neurologist or primary care doctor for further evaluation of your symptoms.   Return instructions:   Please return to the Emergency Department if you experience worsening symptoms.  Return if the medications do not resolve your headache, if it recurs,  or if you have multiple episodes of vomiting or cannot keep down fluids.  Return if you have a change from the usual headache.  RETURN IMMEDIATELY IF you:  Develop a sudden, severe headache  Develop confusion or become poorly responsive or faint  Develop a fever above 100.68F or problem breathing  Have a change in speech, vision, swallowing, or understanding  Develop new weakness, numbness, tingling, incoordination in your arms or legs  Have a seizure  Please return if you have any other emergent concerns.  Additional Information:  Your vital signs today were: BP 128/66    Pulse 73    Temp 98.2 F (36.8 C) (Oral)    Resp 18    Ht 5\' 3"  (1.6 m)    Wt 90.3 kg    SpO2 97%    BMI 35.25 kg/m  If your blood pressure (BP) was elevated above 135/85 this visit, please have this repeated by your doctor within one month. --------------

## 2019-03-14 ENCOUNTER — Telehealth: Payer: Self-pay | Admitting: Neurology

## 2019-03-14 NOTE — Telephone Encounter (Signed)
Patient left message on VM she states that she went to the ED like she was told by [employer] office and the ED do anything for her or give her anything for the Headache. She is still having the headache and would like to speak to someone  Please call

## 2019-03-15 NOTE — Telephone Encounter (Signed)
Patient advised will schedule

## 2019-03-15 NOTE — Telephone Encounter (Signed)
Contacted patient, was given in benadyrl and reglan.Headache continues, please advise office visit.

## 2019-03-15 NOTE — Telephone Encounter (Signed)
She needs to make an appointment first, as these headaches are new to me.  It can be virtual (video/telephone)

## 2019-03-20 NOTE — Progress Notes (Signed)
Virtual Visit via Video Note The purpose of this virtual visit is to provide medical care while limiting exposure to the novel coronavirus.    Consent was obtained for video visit:  Yes Answered questions that patient had about telehealth interaction:  Yes I discussed the limitations, risks, security and privacy concerns of performing an evaluation and management service by telemedicine. I also discussed with the patient that there may be a patient responsible charge related to this service. The patient expressed understanding and agreed to proceed.  Pt location: Home Physician Location: Home Name of referring provider:  Copland, Gay Filler, MD I connected with Amanda Saunders at patients initiation/request on 03/21/2019 at  3:30 PM EDT by video enabled telemedicine application and verified that I am speaking with the correct person using two identifiers. Pt MRN:  [id number] Pt DOB:  20-Mar-1956 Video Participants:  Amanda Saunders   History of Present Illness:  Amanda Saunders is a 63 year old left-handed woman whom I saw for stroke in 2017 and 2020 presents today for new onset headaches.    UPDATE: On 03/03/19, she developed a sudden severe pounding right sided headache (radiating across forehead) with right facial burning.  There was associated nausea, sometimes vomiting, photophobia and phonophobia.  No visual disturbance.  She felt that her equilibrium was off.  She went to the ED on 03/11/19 for further evaluation. CT head personally reviewed and negative for acute intracranial abnormality such as hemorrhage or infarct.  CBC and BMP were unremarkable.  She was given a headache cocktail of Reglan and Benadryl and discharged home.  Headaches are still present but not as severe.  She reported temperature of 99.  They lessened on 8/24.  Headache is mild to moderate, lasting a couple of hours off and on, daily.  Sometimes she feels shaking in her head.  Reports a little bit of neck pain.     Current NSAIDs:  Naproxen 550mg  Current analgesic:  Tylenol, tramadol Current antihypertensive:  HCTZ, losartan, Toprol XL  HISTORY: She was admitted to [facility] on 08/14/18 for increased left arm numbness and weakness with left sided tremor as well as headache, dizziness with nausea and vomiting.  CT of head was personally reviewed and showed no acute findings.  MRI of brain personally reviewed and demonstrated scattered acute watershed subcortical and small cortical infarcts in the right MCA/ACA and MCA/PCA areas.  MRA of head personally reviewed showed severe stenosis or short segment occlusion of the distal right MCA M1 and proximal M2 segments as well as proximal severe stenosis or short segment occlusion of left MCA M2 segment.  Carotid doppler showed no hemodynamically significant stenosis.  2D echocardiogram showed EF 45-50%.  LDL was 77.  Hgb A1c was 6.2.  ASA 81mg  daily was switched to ASA 325mg  and Plavix 75mg  daily for 3 months with plan to subsequently continue Plavix alone.  She was continued on atorvastatin 40mg  daily.    She has been doing well.  Her left hand is mildly weak but she exercises it.  She is talking and walking without difficulty.  Occasionally, she feels numb across the forehead.  She has still been taking ASA with Plavix.  Past Medical History: Past Medical History:  Diagnosis Date  . Cardiomyopathy ([place])   . Hypertension   . Lupus ([place])   . PUD (peptic ulcer disease)   . Stroke ([place]) 08/13/2018  . Vertigo     Medications: Outpatient Encounter Medications as of 03/21/2019  Medication  Sig  . acetaminophen (TYLENOL) 325 MG tablet Take 2 tablets (650 mg total) by mouth every 4 (four) hours as needed for mild pain (or temp > 37.5 C (99.5 F)).  Marland Kitchen apixaban (ELIQUIS) 5 MG TABS tablet Take 1 tablet (5 mg total) by mouth 2 (two) times daily.  Marland Kitchen atorvastatin (LIPITOR) 10 MG tablet Take 1 tablet (10 mg total) by mouth daily. (Patient taking differently:  Take 10 mg by mouth 2 (two) times daily. )  . folic acid (FOLVITE) 1 MG tablet Take 1 mg by mouth daily.  . hydrochlorothiazide (HYDRODIURIL) 25 MG tablet Take 1 tablet (25 mg total) by mouth every other day.  . hydroxychloroquine (PLAQUENIL) 200 MG tablet Take 200 mg by mouth 2 (two) times daily.  Marland Kitchen losartan (COZAAR) 50 MG tablet Take 1 tablet (50 mg total) by mouth daily. (Patient taking differently: Take 50 mg by mouth 2 (two) times daily. )  . meclizine (ANTIVERT) 25 MG tablet TAKE 1 TABLET BY MOUTH TWICE DAILY AS NEEDED FOR DIZZINESS (Patient taking differently: Take 25 mg by mouth 2 (two) times daily as needed for dizziness. TAKE 1 TABLET BY MOUTH TWICE DAILY AS NEEDED FOR DIZZINESS)  . Methotrexate Sodium (METHOTREXATE, PF,) 50 MG/2ML injection Inject 15 mg into the muscle once a week. 0.6 ml  . metoprolol succinate (TOPROL-XL) 25 MG 24 hr tablet Take 1 tablet (25 mg total) by mouth daily.  . traMADol (ULTRAM) 50 MG tablet Take 1 tablet (50 mg total) by mouth every 8 (eight) hours as needed.   No facility-administered encounter medications on file as of 03/21/2019.     Allergies: Allergies  Allergen Reactions  . Codeine Nausea And Vomiting    Family History: Family History  Problem Relation Age of Onset  . Hypertension Mother   . Arthritis Mother   . Heart failure Mother   . Stroke Mother   . Kidney disease Father   . Hypertension Father     Social History: Social History   Socioeconomic History  . Marital status: Single    Spouse name: Not on file  . Number of children: 1  . Years of education: Not on file  . Highest education level: Not on file  Occupational History    Comment: retired  Scientific laboratory technician  . Financial resource strain: Not on file  . Food insecurity    Worry: Not on file    Inability: Not on file  . Transportation needs    Medical: Not on file    Non-medical: Not on file  Tobacco Use  . Smoking status: Former Smoker    Quit date: 07/21/2002    Years  since quitting: 16.6  . Smokeless tobacco: Never Used  Substance and Sexual Activity  . Alcohol use: Yes    Comment: occasional  . Drug use: No  . Sexual activity: Not on file  Lifestyle  . Physical activity    Days per week: Not on file    Minutes per session: Not on file  . Stress: Not on file  Relationships  . Social Herbalist on phone: Not on file    Gets together: Not on file    Attends religious service: Not on file    Active member of club or organization: Not on file    Attends meetings of clubs or organizations: Not on file    Relationship status: Not on file  . Intimate partner violence    Fear of current or ex partner:  Not on file    Emotionally abused: Not on file    Physically abused: Not on file    Forced sexual activity: Not on file  Other Topics Concern  . Not on file  Social History Narrative  . Not on file    Observations/Objective:   Height 5\' 3"  (1.6 m), weight 199 lb (90.3 kg). No acute distress.  Alert and oriented.  Speech fluent and not dysarthric.  Language intact.  Eyes orthophoric on primary gaze.  Face symmetric.  Assessment and Plan:   1.  New onset headache, intensity improved but still daily.  Given her age and history of CVA, would like to rule out other secondary etiologies.  1.  Check MRI/MRA of head 2.  Check sed rate 3.  To help reduce headache frequency, will initiate gabapentin, titrating to 300mg  twice daily. 4.  Limit use of pain relievers to no more than 2 days out of week to prevent risk of rebound or medication-overuse headache. 5.  Follow up in 4 months.  Follow Up Instructions:    -I discussed the assessment and treatment plan with the patient. The patient was provided an opportunity to ask questions and all were answered. The patient agreed with the plan and demonstrated an understanding of the instructions.   The patient was advised to call back or seek an in-person evaluation if the symptoms worsen or if the  condition fails to improve as anticipated.   Dudley Major, DO

## 2019-03-21 ENCOUNTER — Other Ambulatory Visit: Payer: Self-pay

## 2019-03-21 ENCOUNTER — Telehealth (INDEPENDENT_AMBULATORY_CARE_PROVIDER_SITE_OTHER): Payer: Medicare Other | Admitting: Neurology

## 2019-03-21 ENCOUNTER — Telehealth: Payer: Self-pay | Admitting: *Deleted

## 2019-03-21 ENCOUNTER — Encounter: Payer: Self-pay | Admitting: Neurology

## 2019-03-21 VITALS — Ht 63.0 in | Wt 199.0 lb

## 2019-03-21 DIAGNOSIS — I63511 Cerebral infarction due to unspecified occlusion or stenosis of right middle cerebral artery: Secondary | ICD-10-CM

## 2019-03-21 DIAGNOSIS — R51 Headache: Secondary | ICD-10-CM

## 2019-03-21 DIAGNOSIS — R519 Headache, unspecified: Secondary | ICD-10-CM

## 2019-03-21 MED ORDER — DIAZEPAM 10 MG PO TABS: ORAL_TABLET | ORAL | 0 refills | Status: DC

## 2019-03-21 MED ORDER — GABAPENTIN 100 MG PO CAPS: ORAL_CAPSULE | ORAL | 0 refills | Status: DC

## 2019-03-21 NOTE — Telephone Encounter (Signed)
Called patient and informed her MD wanted labs drawn. She will come in am to have labs done.

## 2019-03-21 NOTE — Addendum Note (Signed)
Addended by: Jesse Fall on: 03/21/2019 03:01 PM   Modules accepted: Orders

## 2019-03-24 ENCOUNTER — Other Ambulatory Visit: Payer: Self-pay | Admitting: Family Medicine

## 2019-03-24 ENCOUNTER — Encounter: Payer: Self-pay | Admitting: Family Medicine

## 2019-04-06 ENCOUNTER — Other Ambulatory Visit: Payer: Self-pay

## 2019-04-06 DIAGNOSIS — Z20822 Contact with and (suspected) exposure to covid-19: Secondary | ICD-10-CM

## 2019-04-08 LAB — NOVEL CORONAVIRUS, NAA: SARS-CoV-2, NAA: NOT DETECTED

## 2019-04-11 ENCOUNTER — Telehealth: Payer: Self-pay | Admitting: Neurology

## 2019-04-11 DIAGNOSIS — I428 Other cardiomyopathies: Secondary | ICD-10-CM | POA: Diagnosis not present

## 2019-04-11 NOTE — Telephone Encounter (Signed)
Patient called and made a lab appointment for this Wednesday 04/13/19. She wanted to make sure her lab orders were in the system. Thank you

## 2019-04-12 LAB — BASIC METABOLIC PANEL
BUN/Creatinine Ratio: 11 — ABNORMAL LOW (ref 12–28)
BUN: 10 mg/dL (ref 8–27)
CO2: 26 mmol/L (ref 20–29)
Calcium: 8.5 mg/dL — ABNORMAL LOW (ref 8.7–10.3)
Chloride: 107 mmol/L — ABNORMAL HIGH (ref 96–106)
Creatinine, Ser: 0.88 mg/dL (ref 0.57–1.00)
GFR calc Af Amer: 81 mL/min/{1.73_m2} (ref >59)
GFR calc non Af Amer: 71 mL/min/{1.73_m2} (ref >59)
Glucose: 83 mg/dL (ref 65–99)
Potassium: 4.5 mmol/L (ref 3.5–5.2)
Sodium: 145 mmol/L — ABNORMAL HIGH (ref 134–144)

## 2019-04-13 ENCOUNTER — Other Ambulatory Visit: Payer: Self-pay

## 2019-04-13 ENCOUNTER — Other Ambulatory Visit (INDEPENDENT_AMBULATORY_CARE_PROVIDER_SITE_OTHER): Payer: Medicare Other

## 2019-04-13 DIAGNOSIS — I63511 Cerebral infarction due to unspecified occlusion or stenosis of right middle cerebral artery: Secondary | ICD-10-CM

## 2019-04-13 DIAGNOSIS — R51 Headache: Secondary | ICD-10-CM

## 2019-04-13 DIAGNOSIS — R519 Headache, unspecified: Secondary | ICD-10-CM

## 2019-04-13 LAB — SEDIMENTATION RATE: Sed Rate: 11 mm/hr (ref 0–30)

## 2019-04-13 NOTE — Telephone Encounter (Addendum)
Called patient and told her the labs are in the computer. Sed rate is in the system and message left for [facility] in lab - she verified she can see the order. Confirmed with Kieth Brightly and she will be in lab today normal times - called patient and let her know (left voice mail) that she can come in today just not 12-130.

## 2019-04-13 NOTE — Telephone Encounter (Signed)
I had ordered a sed rate.  It looks like it has been ordered (it's in [place])

## 2019-04-17 ENCOUNTER — Ambulatory Visit
Admission: RE | Admit: 2019-04-17 | Discharge: 2019-04-17 | Disposition: A | Discharge status: Home / Self Care | Payer: Medicare Other | Source: Ambulatory Visit | Attending: Neurology | Admitting: Neurology

## 2019-04-17 ENCOUNTER — Other Ambulatory Visit: Payer: Self-pay

## 2019-04-17 DIAGNOSIS — R519 Headache, unspecified: Secondary | ICD-10-CM

## 2019-04-17 DIAGNOSIS — R51 Headache: Secondary | ICD-10-CM | POA: Diagnosis not present

## 2019-04-17 DIAGNOSIS — I63511 Cerebral infarction due to unspecified occlusion or stenosis of right middle cerebral artery: Secondary | ICD-10-CM

## 2019-04-18 ENCOUNTER — Telehealth: Payer: Self-pay | Admitting: Neurology

## 2019-04-18 DIAGNOSIS — M329 Systemic lupus erythematosus, unspecified: Secondary | ICD-10-CM | POA: Diagnosis not present

## 2019-04-18 NOTE — Telephone Encounter (Signed)
Patient called and left a message that she's seen her recent results on MyChart and she'd like someone to call her to review them with her.

## 2019-04-18 NOTE — Telephone Encounter (Signed)
The MRI shows some plaque buildup in the arteries of the brain.  I would like her to take aspirin 81mg  daily which helps reduce risk of stroke due to plaque buildup in the arteries of the brain.  Eliquis doesn't help with strokes due to this.  I ran this by her cardiologist, Dr. Stanford Breed, and he has no objection to start aspirin 81mg  daily in addition to Eliquis

## 2019-04-19 NOTE — Telephone Encounter (Signed)
Spoke to patient and read to her exactly what Dr. Tomi Likens stated below about the MRI showing some plaque buildup in the arteries of the brain. Informed her to take one 81mg  aspirin a day to reduce the risk of stroke due to plaque buildup in the arteries of the brain. Explained to her that Dr. Tomi Likens discussed above with her cardiologist Dr. Stanford Breed and it was fine to take the aspirin 81mg  daily in addition to Eliquis.  Patient confirmed the correct dose of Aspirin 81mg  daily and verbalized understanding of all above. No further questions.

## 2019-04-19 NOTE — Telephone Encounter (Signed)
Patient called to check in with the nurse.

## 2019-05-14 ENCOUNTER — Other Ambulatory Visit: Payer: Self-pay | Admitting: Family Medicine

## 2019-05-14 DIAGNOSIS — I10 Essential (primary) hypertension: Secondary | ICD-10-CM

## 2019-05-14 DIAGNOSIS — R072 Precordial pain: Secondary | ICD-10-CM

## 2019-05-17 ENCOUNTER — Ambulatory Visit: Payer: Medicare Other | Admitting: Neurology

## 2019-05-24 ENCOUNTER — Other Ambulatory Visit: Payer: Self-pay

## 2019-05-24 DIAGNOSIS — Z20822 Contact with and (suspected) exposure to covid-19: Secondary | ICD-10-CM

## 2019-05-25 LAB — NOVEL CORONAVIRUS, NAA: SARS-CoV-2, NAA: NOT DETECTED

## 2019-05-31 ENCOUNTER — Other Ambulatory Visit: Payer: Self-pay

## 2019-05-31 NOTE — Patient Outreach (Signed)
[facility]) Care Management  05/31/2019  Amanda Saunders 03-06-1956 [id number]   Medication Adherence call to Amanda Saunders HIPPA Compliant Voice message left with a call back number.Amanda Saunders is showing past due on Atorvastatin 10 mg under [place].    [facility] Management Direct Dial [phone]  Fax [phone] [email or web address]

## 2019-07-21 ENCOUNTER — Telehealth: Payer: Medicare Other | Admitting: Neurology

## 2019-07-25 ENCOUNTER — Encounter: Payer: Self-pay | Admitting: Neurology

## 2019-07-25 NOTE — Progress Notes (Signed)
Virtual Visit via Video Note The purpose of this virtual visit is to provide medical care while limiting exposure to the novel coronavirus.    Consent was obtained for video visit:  Yes.   Answered questions that patient had about telehealth interaction:  Yes.   I discussed the limitations, risks, security and privacy concerns of performing an evaluation and management service by telemedicine. I also discussed with the patient that there may be a patient responsible charge related to this service. The patient expressed understanding and agreed to proceed.  Pt location: Home Physician Location: office Name of referring provider:  Copland, Gay Filler, MD I connected with [name] at patients initiation/request on 07/26/2019 at  9:50 AM EST by video enabled telemedicine application and verified that I am speaking with the correct person using two identifiers. Pt MRN:  [id number] Pt DOB:  11-08-1955 Video Participants:  [name]   History of Present Illness:  Amanda Saunders is a 64 year old left-handed woman with history of stroke who follows up for headache.  UPDATE: Underwent workup for new-onset headache: 04/17/2019 MRI w wo and MRA of head: advanced widespread chronic small vessel ischemic changes as well as occlusion of right distal M1 segment and severe stenoses of left M2 branches, A2 branches and mild irregularity of the PCA branches, but no acute intracranial abnormality. 04/13/2019 Sed Rate 11.  Intensity:  Mild to moderate Duration:  Wakes up in morning with headache.  Lasts several hours until she takes a nap later in the day and it is resolved once she wakes up again. Frequency:  15 in 30 days She currently had a headache for 2 days.  Current NSAIDs:  ASA 81mg , naproxen 550mg  Current analgesic:  Tylenol Current antihypertensive:  HCTZ, losartan, Toprol XL Current antiepileptic:  Initially started on gabapentin but did not contact us for  refill. Current antihistamine:  Meclizine Other medication:  Eliquis  HISTORY: Headache: On 03/03/19, she developed a sudden severe pounding right sided headache (radiating across forehead) with right facial burning.  There was associated nausea, sometimes vomiting, photophobia and phonophobia.  No visual disturbance.  She felt that her equilibrium was off.  She went to the ED on 03/11/19 for further evaluation. CT head personally reviewed and negative for acute intracranial abnormality such as hemorrhage or infarct.  CBC and BMP were unremarkable.  She was given a headache cocktail of Reglan and Benadryl and discharged home.  Headaches are still present but not as severe.  She reported temperature of 99.  They lessened on 8/24.  Headache is mild to moderate, lasting a couple of hours off and on, daily.  Sometimes she feels shaking in her head.  Reports a little bit of neck pain.    History of CVA: She was admitted to [facility] on 08/14/18 for increased left arm numbness and weakness with left sided tremor as well as headache, dizziness with nausea and vomiting. CT of head was personally reviewed and showed no acute findings. MRI of brain personally reviewed and demonstrated scattered acute watershed subcortical and small cortical infarcts in the right MCA/ACA and MCA/PCA areas. MRA of head personally reviewed showed severe stenosis or short segment occlusion of the distal right MCA M1 and proximal M2 segments as well as proximal severe stenosis or short segment occlusion of left MCA M2 segment. Carotid doppler showed no hemodynamically significant stenosis. 2D echocardiogram showed EF 45-50%. LDL was 77. Hgb A1c was 6.2. ASA 81mg  daily was switched to  ASA 325mg  and Plavix 75mg  daily for 3 months with plan to subsequently continue Plavix alone. She was continued on atorvastatin 40mg  daily.   She has been doing well. Her left hand is mildly weak but she exercises it. She is talking and  walking without difficulty. Occasionally, she feels numb across the forehead.  Past medication:  tramadol  Past Medical History: Past Medical History:  Diagnosis Date  . Cardiomyopathy ([place])   . Hypertension   . Lupus ([place])   . PUD (peptic ulcer disease)   . Stroke ([place]) 08/13/2018  . Vertigo     Medications: Outpatient Encounter Medications as of 07/26/2019  Medication Sig  . acetaminophen (TYLENOL) 325 MG tablet Take 2 tablets (650 mg total) by mouth every 4 (four) hours as needed for mild pain (or temp > 37.5 C (99.5 F)).  Marland Kitchen apixaban (ELIQUIS) 5 MG TABS tablet Take 1 tablet (5 mg total) by mouth 2 (two) times daily.  Marland Kitchen atorvastatin (LIPITOR) 10 MG tablet Take 1 tablet (10 mg total) by mouth daily. (Patient taking differently: Take 10 mg by mouth 2 (two) times daily. )  . diazepam (VALIUM) 10 MG tablet Take 1 tablet 30 minutes prior to MRI  . folic acid (FOLVITE) 1 MG tablet Take 1 mg by mouth daily.  Marland Kitchen gabapentin (NEURONTIN) 100 MG capsule Take 1 capsule at bedtime x1 week, then 1 capsule twice daily x 1 week, then 2 capsules twice daily x 1 week, then 3 capsules twice daily.  . hydrochlorothiazide (HYDRODIURIL) 25 MG tablet Take 1 tablet (25 mg total) by mouth every other day.  . hydroxychloroquine (PLAQUENIL) 200 MG tablet Take 200 mg by mouth 2 (two) times daily.  Marland Kitchen losartan (COZAAR) 50 MG tablet Take 1 tablet (50 mg total) by mouth daily. (Patient taking differently: Take 50 mg by mouth 2 (two) times daily. )  . meclizine (ANTIVERT) 25 MG tablet TAKE 1 TABLET BY MOUTH TWICE DAILY AS NEEDED FOR DIZZINESS (Patient taking differently: Take 25 mg by mouth 2 (two) times daily as needed for dizziness. TAKE 1 TABLET BY MOUTH TWICE DAILY AS NEEDED FOR DIZZINESS)  . Methotrexate Sodium (METHOTREXATE, PF,) 50 MG/2ML injection Inject 15 mg into the muscle once a week. 0.6 ml  . metoprolol succinate (TOPROL-XL) 25 MG 24 hr tablet Take 1 tablet by mouth once daily  . naproxen sodium (ANAPROX)  550 MG tablet TAKE 1 TABLET BY MOUTH TWICE DAILY WITH MEALS AS NEEDED FOR PAIN  . traMADol (ULTRAM) 50 MG tablet Take 1 tablet (50 mg total) by mouth every 8 (eight) hours as needed.   No facility-administered encounter medications on file as of 07/26/2019.    Allergies: Allergies  Allergen Reactions  . Codeine Nausea And Vomiting    Family History: Family History  Problem Relation Age of Onset  . Hypertension Mother   . Arthritis Mother   . Heart failure Mother   . Stroke Mother   . Kidney disease Father   . Hypertension Father     Social History: Social History   Socioeconomic History  . Marital status: Single    Spouse name: Not on file  . Number of children: 1  . Years of education: Not on file  . Highest education level: Not on file  Occupational History    Comment: retired  Tobacco Use  . Smoking status: Former Smoker    Quit date: 07/21/2004    Years since quitting: 15.0  . Smokeless tobacco: Never Used  Substance and  Sexual Activity  . Alcohol use: Yes    Comment: occasional  . Drug use: No  . Sexual activity: Not on file  Other Topics Concern  . Not on file  Social History Narrative   Lives one level with son/grandchildren; Left handed; high school grad; retired; walks for exercise; no caffeine   Social Determinants of Radio broadcast assistant Strain:   . Difficulty of Paying Living Expenses: Not on file  Food Insecurity:   . Worried About Charity fundraiser in the Last Year: Not on file  . Ran Out of Food in the Last Year: Not on file  Transportation Needs:   . Lack of Transportation (Medical): Not on file  . Lack of Transportation (Non-Medical): Not on file  Physical Activity:   . Days of Exercise per Week: Not on file  . Minutes of Exercise per Session: Not on file  Stress:   . Feeling of Stress : Not on file  Social Connections:   . Frequency of Communication with Friends and Family: Not on file  . Frequency of Social Gatherings with  Friends and Family: Not on file  . Attends Religious Services: Not on file  . Active Member of Clubs or Organizations: Not on file  . Attends Archivist Meetings: Not on file  . Marital Status: Not on file  Intimate Partner Violence:   . Fear of Current or Ex-Partner: Not on file  . Emotionally Abused: Not on file  . Physically Abused: Not on file  . Sexually Abused: Not on file    Observations/Objective:   Height 5\' 3"  (1.6 m), weight 200 lb (90.7 kg). No acute distress.  Alert and oriented.  Speech fluent and not dysarthric.  Language intact.  Eyes orthophoric on primary gaze.  Face symmetric.  Assessment and Plan:   1.  New onset headache, 2.  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  Severe intracranial stenoses. 4.  HTN 5.  HLD 6.  Type 2 diabetes mellitus  1.  Restart gabapentin titrating to 300mg  twice daily.  Advised that she should contact us with update and refill towards end of prescription. 2.  Tylenol as needed (naproxen as last resort) 3.  Secondary stroke prevention as managed by PCP and Cardiology:  Eliquis, statin therapy, blood pressure control, glycemic control. 4.  Follow up in 4 months.  Follow Up Instructions:    -I discussed the assessment and treatment plan with the patient. The patient was provided an opportunity to ask questions and all were answered. The patient agreed with the plan and demonstrated an understanding of the instructions.   The patient was advised to call back or seek an in-person evaluation if the symptoms worsen or if the condition fails to improve as anticipated.    Dudley Major, DO

## 2019-07-26 ENCOUNTER — Other Ambulatory Visit: Payer: Self-pay

## 2019-07-26 ENCOUNTER — Encounter: Payer: Self-pay | Admitting: Neurology

## 2019-07-26 ENCOUNTER — Telehealth (INDEPENDENT_AMBULATORY_CARE_PROVIDER_SITE_OTHER): Payer: Medicare Other | Admitting: Neurology

## 2019-07-26 VITALS — Ht 63.0 in | Wt 200.0 lb

## 2019-07-26 DIAGNOSIS — I10 Essential (primary) hypertension: Secondary | ICD-10-CM

## 2019-07-26 DIAGNOSIS — G9389 Other specified disorders of brain: Secondary | ICD-10-CM

## 2019-07-26 DIAGNOSIS — R519 Headache, unspecified: Secondary | ICD-10-CM | POA: Diagnosis not present

## 2019-07-26 DIAGNOSIS — E785 Hyperlipidemia, unspecified: Secondary | ICD-10-CM

## 2019-07-26 DIAGNOSIS — Z8673 Personal history of transient ischemic attack (TIA), and cerebral infarction without residual deficits: Secondary | ICD-10-CM | POA: Diagnosis not present

## 2019-07-26 DIAGNOSIS — E119 Type 2 diabetes mellitus without complications: Secondary | ICD-10-CM

## 2019-07-26 DIAGNOSIS — I63511 Cerebral infarction due to unspecified occlusion or stenosis of right middle cerebral artery: Secondary | ICD-10-CM

## 2019-07-26 MED ORDER — GABAPENTIN 100 MG PO CAPS: ORAL_CAPSULE | ORAL | 0 refills | Status: DC

## 2019-08-01 ENCOUNTER — Encounter: Payer: Self-pay | Admitting: Family Medicine

## 2019-08-02 DIAGNOSIS — M7989 Other specified soft tissue disorders: Secondary | ICD-10-CM | POA: Diagnosis not present

## 2019-08-02 DIAGNOSIS — M2550 Pain in unspecified joint: Secondary | ICD-10-CM | POA: Diagnosis not present

## 2019-08-02 DIAGNOSIS — M329 Systemic lupus erythematosus, unspecified: Secondary | ICD-10-CM | POA: Diagnosis not present

## 2019-08-02 DIAGNOSIS — A689 Relapsing fever, unspecified: Secondary | ICD-10-CM | POA: Diagnosis not present

## 2019-08-02 DIAGNOSIS — R768 Other specified abnormal immunological findings in serum: Secondary | ICD-10-CM | POA: Diagnosis not present

## 2019-08-03 ENCOUNTER — Other Ambulatory Visit: Payer: Self-pay

## 2019-08-03 NOTE — Progress Notes (Addendum)
[facility] [address], [place], [place] [address] [phone]  Date:  08/04/2019   Name:  Amanda Saunders   DOB:  [birth year]   MRN:  [id number]  PCP:  Darreld Mclean, MD    Chief Complaint: Foot Pain (big tow pain, right foot, swelling, red)   History of Present Illness:  Amanda Saunders is a 64 y.o. very pleasant female patient who presents with the following:  Here today for periodic follow-up visit, and toe pain  She had an acute stroke about 1 year ago, MRI showed scattered acute watershed subcortical and small cortical infarcts on the right Last seen by myself in February 2020 She was left with some left hand weakness and some numbness across her forehead, but overall has recovered well from her stroke She also has history of hypertension, prediabetes  She followed up with her neurologist, Dr. Tomi Likens earlier this month.  She was having some problems with headache, he restart her on gabapentin- this does seem to be helping For secondary stroke prevention she is taking Eliquis, statin, controlling blood sugar and BP  Lab Results  Component Value Date   HGBA1C 6.2 (H) 08/15/2018   Due for A1c today, lipids flu vaccine- pt declines  Pap appears to be due- will do today  Mammogram up-to-date Can suggest Shingrix  She has noted right foot pain at the base of the great toe about 10 days ago No known injury It will be swollen and tender to even light touch She has not had gout in the past  No family history of gout Not as bad as it was a week ago now   She is using plaquenel for her lupus - [facility] rheumatology Marella Chimes [facility] Reviewed her most recent note from 02/2019-she was noted to be doing well on Plaquenil Junie Panning wanted me to check a uric acid, CBC, c-Met today which we are glad to do Patient Active Problem List   Diagnosis Date Noted  . Hospital discharge follow-up 09/07/2018  . Acute  ischemic stroke ([place]) 08/14/2018  . HTN (hypertension) 08/14/2018  . Pre-diabetes 04/03/2016  . History of CVA (cerebrovascular accident) 03/12/2016  . Chest pain at rest 03/12/2016  . Peripheral edema 03/12/2016    Past Medical History:  Diagnosis Date  . Cardiomyopathy ([place])   . Hypertension   . Lupus ([place])   . PUD (peptic ulcer disease)   . Stroke ([place]) 08/13/2018  . Vertigo     Past Surgical History:  Procedure Laterality Date  . BACK SURGERY    . CARPAL TUNNEL RELEASE    . NECK SURGERY     2015    Social History   Tobacco Use  . Smoking status: Former Smoker    Quit date: 07/21/2004    Years since quitting: 15.0  . Smokeless tobacco: Never Used  Substance Use Topics  . Alcohol use: Yes    Comment: occasional  . Drug use: No    Family History  Problem Relation Age of Onset  . Hypertension Mother   . Arthritis Mother   . Heart failure Mother   . Stroke Mother   . Kidney disease Father   . Hypertension Father     Allergies  Allergen Reactions  . Codeine Nausea And Vomiting    Medication list has been reviewed and updated.  Current Outpatient Medications on File Prior to Visit  Medication Sig Dispense Refill  .  acetaminophen (TYLENOL) 325 MG tablet Take 2 tablets (650 mg total) by mouth every 4 (four) hours as needed for mild pain (or temp > 37.5 C (99.5 F)).    Marland Kitchen apixaban (ELIQUIS) 5 MG TABS tablet Take 1 tablet (5 mg total) by mouth 2 (two) times daily. 60 tablet 6  . aspirin EC 81 MG tablet Take 81 mg by mouth daily.    Marland Kitchen atorvastatin (LIPITOR) 10 MG tablet Take 1 tablet (10 mg total) by mouth daily. (Patient taking differently: Take 10 mg by mouth 2 (two) times daily. ) 90 tablet 3  . folic acid (FOLVITE) 1 MG tablet Take 1 mg by mouth daily.    Marland Kitchen gabapentin (NEURONTIN) 100 MG capsule Take 1 capsule at bedtime x1 week, then 1 capsule twice daily x 1 week, then 2 capsules twice daily x 1 week, then 3 capsules twice daily. 180 capsule 0  .  hydrochlorothiazide (HYDRODIURIL) 25 MG tablet Take 1 tablet (25 mg total) by mouth every other day. 45 tablet 3  . hydroxychloroquine (PLAQUENIL) 200 MG tablet Take 200 mg by mouth 2 (two) times daily.    . meclizine (ANTIVERT) 25 MG tablet TAKE 1 TABLET BY MOUTH TWICE DAILY AS NEEDED FOR DIZZINESS (Patient taking differently: Take 25 mg by mouth 2 (two) times daily as needed for dizziness. TAKE 1 TABLET BY MOUTH TWICE DAILY AS NEEDED FOR DIZZINESS) 30 tablet 5  . Methotrexate Sodium (METHOTREXATE, PF,) 50 MG/2ML injection Inject 15 mg into the muscle once a week. 0.6 ml    . metoprolol succinate (TOPROL-XL) 25 MG 24 hr tablet Take 1 tablet by mouth once daily 90 tablet 0  . naproxen sodium (ANAPROX) 550 MG tablet TAKE 1 TABLET BY MOUTH TWICE DAILY WITH MEALS AS NEEDED FOR PAIN    . traMADol (ULTRAM) 50 MG tablet Take 1 tablet (50 mg total) by mouth every 8 (eight) hours as needed. 30 tablet 0  . losartan (COZAAR) 50 MG tablet Take 1 tablet (50 mg total) by mouth daily. (Patient taking differently: Take 50 mg by mouth 2 (two) times daily. ) 90 tablet 3   No current facility-administered medications on file prior to visit.    Review of Systems:  As per HPI- otherwise negative.    Physical Examination: Vitals:   08/04/19 1110  BP: 118/70  Pulse: 67  Resp: 17  Temp: (!) 96.9 F (36.1 C)  SpO2: 100%   Vitals:   08/04/19 1110  Weight: 205 lb (93 kg)  Height: '5\' 3"'  (1.6 m)   Body mass index is 36.31 kg/m. Ideal Body Weight: Weight in (lb) to have BMI = 25: 140.8  GEN: WDWN, NAD, Non-toxic, A & O x 3, obese, looks well HEENT: Atraumatic, Normocephalic. Neck supple. No masses, No LAD. Ears and Nose: No external deformity. CV: RRR, No M/G/R. No JVD. No thrill. No extra heart sounds. PULM: CTA B, no wheezes, crackles, rhonchi. No retractions. No resp. distress. No accessory muscle use. ABD: S, NT, ND, +BS. No rebound. No HSM. EXTR: No c/c/e NEURO Normal gait.  PSYCH: Normally  interactive. Conversant. Not depressed or anxious appearing.  Calm demeanor.  Pelvic: normal, no vaginal lesions or discharge. Uterus normal, no CMT, no adnexal tendereness or masses Collected Pap today The right first MTP joint is slightly tender to movement, she has mild swelling over the dorsum of the right foot Foot is warm and well-perfused, no apparent injury   Assessment and Plan: Acute ischemic stroke ([place]) - Plan:  Lipid panel  Peripheral edema  Screening for thyroid disorder - Plan: TSH  Essential hypertension - Plan: CBC, Comprehensive metabolic panel  Pre-diabetes - Plan: Hemoglobin A1c  Systemic lupus erythematosus, unspecified SLE type, unspecified organ involvement status (HCC)  Great toe pain, right - Plan: Uric acid, colchicine 0.6 MG tablet  Screening for cervical cancer - Plan: Cytology - PAP  Here today for a follow-up visit Stable from recent stroke Blood pressure well controlled  Pap is pending Suspect her foot pain is due to gout Prescribed colchicine, explained how to use this medication-hold statin for about 48 hours after dose of colchicine Labs pending Moderate medical decision making today This visit occurred during the SARS-CoV-2 public health emergency.  Safety protocols were in place, including screening questions prior to the visit, additional usage of staff PPE, and extensive cleaning of exam room while observing appropriate contact time as indicated for disinfecting solutions.    Signed Lamar Blinks, MD  Received her labs, message to patient A1c is stable to improved  Results for orders placed or performed in visit on 08/04/19  CBC  Result Value Ref Range   WBC 7.0 4.0 - 10.5 K/uL   RBC 4.50 3.87 - 5.11 Mil/uL   Platelets 272.0 150.0 - 400.0 K/uL   Hemoglobin 13.2 12.0 - 15.0 g/dL   HCT 40.1 36.0 - 46.0 %   MCV 89.1 78.0 - 100.0 fl   MCHC 32.9 30.0 - 36.0 g/dL   RDW 15.5 11.5 - 15.5 %  Comprehensive metabolic panel  Result Value  Ref Range   Sodium 140 135 - 145 mEq/L   Potassium 4.3 3.5 - 5.1 mEq/L   Chloride 104 96 - 112 mEq/L   CO2 30 19 - 32 mEq/L   Glucose, Bld 100 (H) 70 - 99 mg/dL   BUN 14 6 - 23 mg/dL   Creatinine, Ser 0.84 0.40 - 1.20 mg/dL   Total Bilirubin 0.3 0.2 - 1.2 mg/dL   Alkaline Phosphatase 89 39 - 117 U/L   AST 28 0 - 37 U/L   ALT 40 (H) 0 - 35 U/L   Total Protein 7.1 6.0 - 8.3 g/dL   Albumin 4.4 3.5 - 5.2 g/dL   GFR 83.03 >60.00 mL/min   Calcium 9.2 8.4 - 10.5 mg/dL  Hemoglobin A1c  Result Value Ref Range   Hgb A1c MFr Bld 6.1 4.6 - 6.5 %  Lipid panel  Result Value Ref Range   Cholesterol 153 0 - 200 mg/dL   Triglycerides 68.0 0.0 - 149.0 mg/dL   HDL 47.50 >39.00 mg/dL   VLDL 13.6 0.0 - 40.0 mg/dL   LDL Cholesterol 92 0 - 99 mg/dL   Total CHOL/HDL Ratio 3    NonHDL 105.95   TSH  Result Value Ref Range   TSH 2.68 0.35 - 4.50 uIU/mL  Uric acid  Result Value Ref Range   Uric Acid, Serum 6.2 2.4 - 7.0 mg/dL    Received her Pap as below, 1/18-message to patient  High risk HPV Negative   Adequacy Satisfactory for evaluation; transformation zone component PRESENT.   Diagnosis - Negative for intraepithelial lesion or malignancy (NILM)   Comment Normal Reference Range HPV - Negative

## 2019-08-03 NOTE — Patient Instructions (Addendum)
It was good to see you again today, I will be in touch with your labs and Pap ASAP I think that your toe pain is due to gout We are checking a uric acid level today to help determine this In the meantime, you can use colchicine as directed for your gout attack The colchicine can interact with your cholesterol medication, so hold your atorvastatin for couple of days when you use colchicine  Please consider getting the shingles vaccine at your convenience  Take care

## 2019-08-04 ENCOUNTER — Encounter: Payer: Self-pay | Admitting: Family Medicine

## 2019-08-04 ENCOUNTER — Other Ambulatory Visit: Payer: Self-pay

## 2019-08-04 ENCOUNTER — Ambulatory Visit (INDEPENDENT_AMBULATORY_CARE_PROVIDER_SITE_OTHER): Payer: Medicare Other | Admitting: Family Medicine

## 2019-08-04 ENCOUNTER — Other Ambulatory Visit (HOSPITAL_COMMUNITY)
Admission: RE | Admit: 2019-08-04 | Discharge: 2019-08-04 | Disposition: A | Discharge status: Home / Self Care | Payer: Medicare Other | Source: Ambulatory Visit | Attending: Family Medicine | Admitting: Family Medicine

## 2019-08-04 VITALS — BP 118/70 | HR 67 | Temp 96.9°F | Resp 17 | Ht 63.0 in | Wt 205.0 lb

## 2019-08-04 DIAGNOSIS — M79674 Pain in right toe(s): Secondary | ICD-10-CM | POA: Diagnosis not present

## 2019-08-04 DIAGNOSIS — I10 Essential (primary) hypertension: Secondary | ICD-10-CM

## 2019-08-04 DIAGNOSIS — R7303 Prediabetes: Secondary | ICD-10-CM

## 2019-08-04 DIAGNOSIS — Z1329 Encounter for screening for other suspected endocrine disorder: Secondary | ICD-10-CM

## 2019-08-04 DIAGNOSIS — Z1151 Encounter for screening for human papillomavirus (HPV): Secondary | ICD-10-CM | POA: Insufficient documentation

## 2019-08-04 DIAGNOSIS — Z124 Encounter for screening for malignant neoplasm of cervix: Secondary | ICD-10-CM | POA: Diagnosis present

## 2019-08-04 DIAGNOSIS — Z8673 Personal history of transient ischemic attack (TIA), and cerebral infarction without residual deficits: Secondary | ICD-10-CM

## 2019-08-04 DIAGNOSIS — I639 Cerebral infarction, unspecified: Secondary | ICD-10-CM

## 2019-08-04 DIAGNOSIS — R609 Edema, unspecified: Secondary | ICD-10-CM | POA: Diagnosis not present

## 2019-08-04 DIAGNOSIS — R600 Localized edema: Secondary | ICD-10-CM

## 2019-08-04 DIAGNOSIS — M329 Systemic lupus erythematosus, unspecified: Secondary | ICD-10-CM

## 2019-08-04 LAB — COMPREHENSIVE METABOLIC PANEL
ALT: 40 U/L — ABNORMAL HIGH (ref 0–35)
AST: 28 U/L (ref 0–37)
Albumin: 4.4 g/dL (ref 3.5–5.2)
Alkaline Phosphatase: 89 U/L (ref 39–117)
BUN: 14 mg/dL (ref 6–23)
CO2: 30 mEq/L (ref 19–32)
Calcium: 9.2 mg/dL (ref 8.4–10.5)
Chloride: 104 mEq/L (ref 96–112)
Creatinine, Ser: 0.84 mg/dL (ref 0.40–1.20)
GFR: 83.03 mL/min (ref >60.00)
Glucose, Bld: 100 mg/dL — ABNORMAL HIGH (ref 70–99)
Potassium: 4.3 mEq/L (ref 3.5–5.1)
Sodium: 140 mEq/L (ref 135–145)
Total Bilirubin: 0.3 mg/dL (ref 0.2–1.2)
Total Protein: 7.1 g/dL (ref 6.0–8.3)

## 2019-08-04 LAB — HEMOGLOBIN A1C: Hgb A1c MFr Bld: 6.1 % (ref 4.6–6.5)

## 2019-08-04 LAB — CBC
HCT: 40.1 % (ref 36.0–46.0)
Hemoglobin: 13.2 g/dL (ref 12.0–15.0)
MCHC: 32.9 g/dL (ref 30.0–36.0)
MCV: 89.1 fl (ref 78.0–100.0)
Platelets: 272 10*3/uL (ref 150.0–400.0)
RBC: 4.5 Mil/uL (ref 3.87–5.11)
RDW: 15.5 % (ref 11.5–15.5)
WBC: 7 10*3/uL (ref 4.0–10.5)

## 2019-08-04 LAB — LIPID PANEL
Cholesterol: 153 mg/dL (ref 0–200)
HDL: 47.5 mg/dL (ref >39.00)
LDL Cholesterol: 92 mg/dL (ref 0–99)
NonHDL: 105.95
Total CHOL/HDL Ratio: 3
Triglycerides: 68 mg/dL (ref 0.0–149.0)
VLDL: 13.6 mg/dL (ref 0.0–40.0)

## 2019-08-04 LAB — TSH: TSH: 2.68 u[IU]/mL (ref 0.35–4.50)

## 2019-08-04 LAB — URIC ACID: Uric Acid, Serum: 6.2 mg/dL (ref 2.4–7.0)

## 2019-08-04 MED ORDER — COLCHICINE 0.6 MG PO TABS: ORAL_TABLET | ORAL | 0 refills | Status: DC

## 2019-08-08 ENCOUNTER — Encounter: Payer: Self-pay | Admitting: Family Medicine

## 2019-08-08 LAB — CYTOLOGY - PAP
Comment: NEGATIVE
Diagnosis: NEGATIVE
High risk HPV: NEGATIVE

## 2019-08-19 ENCOUNTER — Encounter: Payer: Self-pay | Admitting: Family Medicine

## 2019-08-19 ENCOUNTER — Other Ambulatory Visit: Payer: Self-pay | Admitting: Family Medicine

## 2019-08-24 ENCOUNTER — Ambulatory Visit: Payer: Medicare Other | Attending: Internal Medicine

## 2019-08-24 DIAGNOSIS — Z20822 Contact with and (suspected) exposure to covid-19: Secondary | ICD-10-CM

## 2019-08-25 ENCOUNTER — Telehealth: Payer: Self-pay | Admitting: *Deleted

## 2019-08-25 LAB — NOVEL CORONAVIRUS, NAA: SARS-CoV-2, NAA: NOT DETECTED

## 2019-08-25 NOTE — Telephone Encounter (Signed)
A message was left, re: her follow up visit with Dr.Crenshaw. 

## 2019-09-12 NOTE — Progress Notes (Signed)
Virtual Visit via Telephone Note   This visit type was conducted due to national recommendations for restrictions regarding the COVID-19 Pandemic (e.g. social distancing) in an effort to limit this patient's exposure and mitigate transmission in our community.  Due to her co-morbid illnesses, this patient is at least at moderate risk for complications without adequate follow up.  This format is felt to be most appropriate for this patient at this time.  The patient did not have access to video technology/had technical difficulties with video requiring transitioning to audio format only (telephone).  All issues noted in this document were discussed and addressed.  No physical exam could be performed with this format.  Please refer to the patient's chart for her  consent to telehealth for [facility].  Evaluation Performed:  Follow-up visit  This visit type was conducted due to national recommendations for restrictions regarding the COVID-19 Pandemic (e.g. social distancing).  This format is felt to be most appropriate for this patient at this time.  All issues noted in this document were discussed and addressed.  No physical exam was performed (except for noted visual exam findings with Video Visits).  Please refer to the patient's chart (MyChart message for video visits and phone note for telephone visits) for the patient's consent to telehealth for [facility]  Date:  09/13/2019   ID:  Amanda Saunders, DOB 12-06-1955, MRN [id number]  Patient Location:  [place] [address]   Provider location:   [facility] [place] [address], [place] [address]  PCP:  Darreld Mclean, MD  Cardiologist:  Kirk Ruths, MD  Electrophysiologist:  None   Chief Complaint: Follow-up  History of Present Illness:    Amanda Saunders is a 64 y.o. female who presents via audio/video conferencing for a telehealth visit today.  Patient verified DOB  and address.  The patient does not symptoms concerning for COVID-19 infection (fever, chills, cough, or new SHORTNESS OF BREATH).   She has a PMH of HTN, acute ischemic stroke, and chest pain at rest, peripheral edema.  She had a nuclear stress test 11/17 which showed no infarct or ischemia.  Her LV function at that time was mildly reduced.  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  She had a CVA 07/2018.  Her echocardiogram 07/2018 showed an EF of 45-50%.  Her carotid Dopplers 07/2018 showed 1-39% bilateral carotid stenosis.  Cardiac MRI 8/20 showed an EF of 44% and findings consistent with noncompaction, mild LAE, mild AI, and MR.  When she was last seen by Dr. Stanford Breed on 03/04/2019 she was doing okay however, she noted occasional dyspnea and pedal edema.  She denied chest pain palpitations or syncope.     She is seen virtually today in follow-up and states she feels well.  She has noticed that she has been having some episodes where she wakes up gasping for air.  She denies orthopnea and increased lower extremity edema.  She indicates that she does snore according to her husband.  She has been focusing on her diet and trying to eat a low-sodium foods.  She states that she has purchased a Building services engineer at [employer] but has not yet attended and states that she is "lazy".  She was walking when the weather was nice however, has stopped walking due to the winter weather that we have had recently.  She  states she is able to climb her stairs without difficulty but notices some dyspnea.  She states that with normal activities she does not have any trouble breathing.  I will send her the Epworth sleep evaluation, schedule an echocardiogram for 6 months and have her follow-up in 6 months.  Today she denies chest pain, shortness of breath, lower extremity edema, fatigue, palpitations, melena, hematuria,  hemoptysis, diaphoresis, weakness, presyncope, syncope, orthopnea.    Prior CV studies:   The following studies were reviewed today:  EKG 08/15/2018 Sinus rhythm borderline T wave abnormalities anterior leads prolonged QT 63 bpm  Echocardiogram 08/15/2018 Study Conclusions   - Left ventricle: The cavity size was normal. Wall thickness was  normal. Systolic function was mildly reduced. The estimated  ejection fraction was in the range of 45% to 50%. Doppler  parameters are consistent with abnormal left ventricular  relaxation (grade 1 diastolic dysfunction).  - Aortic valve: Valve area (VTI): 1.98 cm^2. Valve area (Vmax): 1.7  cm^2.  - Atrial septum: No defect or patent foramen ovale was identified.  - Pulmonary arteries: Systolic pressure was moderately increased.  PA peak pressure: 40 mm Hg (S).   Past Medical History:  Diagnosis Date  . Cardiomyopathy ([place])   . Hypertension   . Lupus ([place])   . PUD (peptic ulcer disease)   . Stroke ([place]) 08/13/2018  . Vertigo    Past Surgical History:  Procedure Laterality Date  . BACK SURGERY    . CARPAL TUNNEL RELEASE    . NECK SURGERY     2015     No outpatient medications have been marked as taking for the 09/13/19 encounter (Telemedicine) with Deberah Pelton, NP.     Allergies:   Codeine   Social History   Tobacco Use  . Smoking status: Former Smoker    Quit date: 07/21/2004    Years since quitting: 15.1  . Smokeless tobacco: Never Used  Substance Use Topics  . Alcohol use: Yes    Comment: occasional  . Drug use: No     Family Hx: The patient's family history includes Arthritis in her mother; Heart failure in her mother; Hypertension in her father and mother; Kidney disease in her father; Stroke in her mother.  ROS:   Please see the history of present illness.     All other systems reviewed and are negative.   Labs/Other Tests and Data Reviewed:    Recent Labs: 08/04/2019: ALT 40; BUN 14;  Creatinine, Ser 0.84; Hemoglobin 13.2; Platelets 272.0; Potassium 4.3; Sodium 140; TSH 2.68   Recent Lipid Panel Lab Results  Component Value Date/Time   CHOL 153 08/04/2019 11:31 AM   TRIG 68.0 08/04/2019 11:31 AM   HDL 47.50 08/04/2019 11:31 AM   CHOLHDL 3 08/04/2019 11:31 AM   LDLCALC 92 08/04/2019 11:31 AM    Wt Readings from Last 3 Encounters:  09/13/19 203 lb (92.1 kg)  08/04/19 205 lb (93 kg)  07/25/19 200 lb (90.7 kg)     Exam:    Vital Signs:  BP 117/68   Pulse 63   Ht 5\' 3"  (1.6 m)   Wt 203 lb (92.1 kg)   BMI 35.96 kg/m    Well nourished, well developed female in no  acute distress.   ASSESSMENT & PLAN:    1.  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  Stress test 11/17 showed no ST segment deviation and was considered low risk. Continue metoprolol succinate 25 mg daily Continue losartan 50 mg daily Continue HCTZ 25 mg daily Heart healthy low-sodium diet Increase physical activity as tolerated Repeat echocardiogram in 6 months  Obstructive sleep apnea-patient has noticed  PND, spouse indicates she snores.  Expresses daytime somnolence. Send Epworth sleep evaluation Order sleep study Continue weight loss  Hyperlipidemia-08/04/2019: Cholesterol 153; HDL 47.50; LDL Cholesterol 92; Triglycerides 68.0; VLDL 13.6 Continue atorvastatin 10 mg tablet twice daily   CVA-history of CVA.  May have been related to intracranial vascular disease.  Due to her noncompaction there is concern for embolic event.  Her antiplatelet therapy was discontinued however, her apixaban is still prescribed.   Disposition: Follow-up with Dr. Stanford Breed in 6 months.  COVID-19 Education: The signs and symptoms of  COVID-19 were discussed with the patient and how to seek care for testing (follow up with PCP or arrange E-visit).  The importance of social distancing was discussed today.  Patient Risk:   After full review of this patients clinical status, I feel that they are at least moderate risk at this time.  Time:   Today, I have spent 16 minutes with the patient with telehealth technology discussing noncompaction, cardiomyopathy, medication, diet, exercise, OSA..     Medication Adjustments/Labs and Tests Ordered: Current medicines are reviewed at length with the patient today.  Concerns regarding medicines are outlined above.   Tests Ordered: No orders of the defined types were placed in this encounter.  Medication Changes: No orders of the defined types were placed in this encounter.   Disposition:  in 6 month(s)  Signed, Jossie Ng. [facility] [place] [address] Office [phone] Fax [phone]

## 2019-09-13 ENCOUNTER — Telehealth (INDEPENDENT_AMBULATORY_CARE_PROVIDER_SITE_OTHER): Payer: Medicare Other | Admitting: General Practice

## 2019-09-13 VITALS — BP 117/68 | HR 63 | Ht 63.0 in | Wt 203.0 lb

## 2019-09-13 DIAGNOSIS — E785 Hyperlipidemia, unspecified: Secondary | ICD-10-CM | POA: Diagnosis not present

## 2019-09-13 DIAGNOSIS — I428 Other cardiomyopathies: Secondary | ICD-10-CM | POA: Diagnosis not present

## 2019-09-13 DIAGNOSIS — Z8673 Personal history of transient ischemic attack (TIA), and cerebral infarction without residual deficits: Secondary | ICD-10-CM

## 2019-09-13 DIAGNOSIS — G4733 Obstructive sleep apnea (adult) (pediatric): Secondary | ICD-10-CM

## 2019-09-13 DIAGNOSIS — E7800 Pure hypercholesterolemia, unspecified: Secondary | ICD-10-CM

## 2019-09-13 NOTE — Patient Instructions (Signed)
Testing/Procedures: Barrister's clerk) - Your physician has requested that you have an echocardiogram. Echocardiography is a painless test that uses sound waves to create images of your heart. It provides your doctor with information about the size and shape of your heart and how well your heart's chambers and valves are working. This procedure takes approximately one hour. There are no restrictions for this procedure. This will be performed at our [facility] location - [address].  Special Instructions: PLEASE INCREASE PHYSICAL ACTIVITY TO 3 TIMES/WEEK  PLEASE READ AND FOLLOW SALTY 6 ATTACHED  PLEASE FILL OUT SLEEP QUESTIONAIRE  Follow-Up: 6 months Please call our office 2 months in advance, JUN 2021 to schedule this AUG 2021 appointment. In Person Kirk Ruths, MD.     At [facility], you and your health needs are our priority.  As part of our continuing mission to provide you with exceptional heart care, we have created designated Provider Care Teams.  These Care Teams include your primary Cardiologist (physician) and Advanced Practice Providers (APPs -  Physician Assistants and Nurse Practitioners) who all work together to provide you with the care you need, when you need it.  Reduce your risk of getting COVID-19 With your heart disease it is especially important for people at increased risk of severe illness from COVID-19, and those who live with them, to protect themselves from getting COVID-19. The best way to protect yourself and to help reduce the spread of the virus that causes COVID-19 is to: Marland Kitchen Limit your interactions with other people as much as possible. . Take COVID-19 when you do interact with others. If you start feeling sick and think you may have COVID-19, get in touch with your healthcare provider within 24 hours.  Thank you for choosing [facility]!!

## 2019-09-15 ENCOUNTER — Telehealth: Payer: Self-pay | Admitting: Family Medicine

## 2019-09-15 NOTE — Chronic Care Management (AMB) (Signed)
  Chronic Care Management   Note  09/15/2019 Name: TIMESHA CERVANTEZ MRN: [id number] DOB: 1955/12/19  STAR RESLER is a 64 y.o. year old female who is a primary care patient of Copland, Gay Filler, MD. I reached out to [name] by phone today in response to a referral sent by Ms. Leighton Roach Saulter's PCP, Copland, Gay Filler, MD.   Ms. Gruenhagen was given information about Chronic Care Management services today including:  1. CCM service includes personalized support from designated clinical staff supervised by her physician, including individualized plan of care and coordination with other care providers 2. 24/7 contact phone numbers for assistance for urgent and routine care needs. 3. Service will only be billed when office clinical staff spend 20 minutes or more in a month to coordinate care. 4. Only one practitioner may furnish and bill the service in a calendar month. 5. The patient may stop CCM services at any time (effective at the end of the month) by phone call to the office staff. 6. The patient will be responsible for cost sharing (co-pay) of up to 20% of the service fee (after annual deductible is met).  Patient agreed to services and verbal consent obtained.   Follow up plan:   Raynicia Dukes [employer] Scheduler

## 2019-09-16 ENCOUNTER — Other Ambulatory Visit: Payer: Self-pay

## 2019-09-16 ENCOUNTER — Telehealth: Payer: Self-pay | Admitting: Family Medicine

## 2019-09-16 MED ORDER — GABAPENTIN 100 MG PO CAPS: ORAL_CAPSULE | ORAL | 1 refills | Status: DC

## 2019-09-16 NOTE — Progress Notes (Signed)
Error. Patient already has a note. Just rescheduled appt.    Raynicia Dukes [employer] Scheduler

## 2019-09-16 NOTE — Chronic Care Management (AMB) (Signed)
  Chronic Care Management   Note  09/16/2019 Name: Amanda Saunders MRN: [id number] DOB: 1956/05/23  Amanda Saunders is a 64 y.o. year old female who is a primary care patient of Copland, Gay Filler, MD. I reached out to [name] by phone today in response to a referral sent by Ms. Leighton Roach Myer's PCP, Copland, Gay Filler, MD.   Ms. Ocanas was given information about Chronic Care Management services today including:  1. CCM service includes personalized support from designated clinical staff supervised by her physician, including individualized plan of care and coordination with other care providers 2. 24/7 contact phone numbers for assistance for urgent and routine care needs. 3. Service will only be billed when office clinical staff spend 20 minutes or more in a month to coordinate care. 4. Only one practitioner may furnish and bill the service in a calendar month. 5. The patient may stop CCM services at any time (effective at the end of the month) by phone call to the office staff. 6. The patient will be responsible for cost sharing (co-pay) of up to 20% of the service fee (after annual deductible is met).  Patient agreed to services and verbal consent obtained.   Follow up plan:   Raynicia Dukes [employer] Scheduler

## 2019-09-26 ENCOUNTER — Other Ambulatory Visit: Payer: Self-pay

## 2019-09-26 DIAGNOSIS — E785 Hyperlipidemia, unspecified: Secondary | ICD-10-CM

## 2019-09-26 DIAGNOSIS — I10 Essential (primary) hypertension: Secondary | ICD-10-CM

## 2019-09-26 DIAGNOSIS — R7303 Prediabetes: Secondary | ICD-10-CM

## 2019-09-26 NOTE — Progress Notes (Signed)
Referral placed for chronic care management.

## 2019-09-29 ENCOUNTER — Telehealth: Payer: Medicare Other

## 2019-09-30 ENCOUNTER — Ambulatory Visit: Payer: Medicare Other | Admitting: Pharmacist

## 2019-09-30 ENCOUNTER — Other Ambulatory Visit: Payer: Self-pay

## 2019-09-30 DIAGNOSIS — R7303 Prediabetes: Secondary | ICD-10-CM

## 2019-09-30 DIAGNOSIS — Z8673 Personal history of transient ischemic attack (TIA), and cerebral infarction without residual deficits: Secondary | ICD-10-CM

## 2019-09-30 DIAGNOSIS — I10 Essential (primary) hypertension: Secondary | ICD-10-CM

## 2019-09-30 DIAGNOSIS — M329 Systemic lupus erythematosus, unspecified: Secondary | ICD-10-CM

## 2019-09-30 NOTE — Chronic Care Management (AMB) (Signed)
Chronic Care Management Pharmacy  Name: Amanda Saunders  MRN: [id number] DOB: Mar 23, 1956  Chief Complaint/ HPI  Amanda Saunders,  63 y.o. , female presents for their Initial CCM visit with the clinical pharmacist via telephone due to COVID-19 Pandemic.  PCP : Amanda Mclean, MD  Their chronic conditions include: PreDM, HTN, Hx of CVA, Noncompaction Cardiomyopathy, Lupus, Gout, Pain  Office Visits: 08/04/19: Visit w/ Dr. Lorelei Saunders - Complete physical. Labs ordered. Foot pain with suspicion of gout. Prescribed colchicine  Consult Visit: 09/13/19: Cardio visit w/ Amanda Memos, NP - Noncompaction cardiomyopathy. Pt wakes up gasping for air. Sleep study ordered. No medication changes noted  07/26/19: Neuro visit w/ Dr. Tomi Saunders - New onset HA after age 72. Restart gabapentin 300mg  BID. Pt to contact office for updated and refills toward end of rx. Tylenol PRN, naproxen as last resort. Follow up in 4 months  05/31/19: [facility] Med Adherence Call - pt past due on atorvastatin 10mg   Miscellaneous: 05/24/19: Negative COVID result 04/06/19: Negative COVID result  Medications: Outpatient Encounter Medications as of 09/30/2019  Medication Sig Note  . acetaminophen (TYLENOL) 325 MG tablet Take 2 tablets (650 mg total) by mouth every 4 (four) hours as needed for mild pain (or temp > 37.5 C (99.5 F)).   Marland Kitchen apixaban (ELIQUIS) 5 MG TABS tablet Take 1 tablet (5 mg total) by mouth 2 (two) times daily.   Marland Kitchen aspirin EC 81 MG tablet Take 81 mg by mouth daily.   Marland Kitchen atorvastatin (LIPITOR) 10 MG tablet Take 1 tablet (10 mg total) by mouth daily.   . folic acid (FOLVITE) 1 MG tablet Take 1 mg by mouth daily.   Marland Kitchen gabapentin (NEURONTIN) 100 MG capsule 3 capsules twice daily.   . hydrochlorothiazide (HYDRODIURIL) 25 MG tablet Take 1 tablet (25 mg total) by mouth every other Amanda Saunders. 10/03/2019: Patient only taking on Tu/Th  . hydroxychloroquine (PLAQUENIL) 200 MG tablet Take 200 mg by mouth 2 (two) times daily.   .  meclizine (ANTIVERT) 25 MG tablet TAKE 1 TABLET BY MOUTH TWICE DAILY AS NEEDED FOR DIZZINESS (Patient taking differently: Take 25 mg by mouth 2 (two) times daily as needed for dizziness. TAKE 1 TABLET BY MOUTH TWICE DAILY AS NEEDED FOR DIZZINESS)   . Methotrexate Sodium (METHOTREXATE, PF,) 50 MG/2ML injection Inject 15 mg into the muscle once a week. 0.6 ml   . metoprolol succinate (TOPROL-XL) 25 MG 24 hr tablet Take 1 tablet by mouth once daily   . naproxen sodium (ANAPROX) 550 MG tablet TAKE 1 TABLET BY MOUTH TWICE DAILY WITH MEALS AS NEEDED FOR PAIN   . traMADol (ULTRAM) 50 MG tablet Take 1 tablet (50 mg total) by mouth every 8 (eight) hours as needed.   . colchicine 0.6 MG tablet Take 2 pills once, then 1 an hour later as needed for gout.  Hold the rest of rx for later gout attack (Patient not taking: Reported on 09/30/2019) 09/30/2019: Patient doesn't have this available  . losartan (COZAAR) 50 MG tablet Take 1 tablet (50 mg total) by mouth daily.    No facility-administered encounter medications on file as of 09/30/2019.   Immunization History  Administered Date(s) Administered  . Tdap 03/12/2016     Current Diagnosis/Assessment:  Goals Addressed            This Visit's Progress   . A1c goal less than 6.5%       -Pre-Diabetes a1c range is 5.7% to 6.4%. Your most recent a1c  was 6.1% on 08/04/2019.  -Usually the full diabetes diagnosis is given when a1c reaches 6.5% or higher.     . Blood pressure goal less than 140/90      . Consider increasing atorvastatin to 20mg  daily       With your history it is important to reduce your risk of heart attack and stroke.  An LDL goal of less than 70 would be ideal.     . LDL goal less than 70      . Pharmacy Care Plan       CARE PLAN ENTRY  Current Barriers:  . Chronic Disease Management support, education, and care coordination needs related to PreDM, HTN, Hx of CVA, Noncompaction Cardiomyopathy, Lupus, Gout, Pain  Pharmacist Clinical  Goal(s):  Marland Kitchen Over the next 90 days, patient will demonstrate Improved medication adherence as evidenced by appropriate fill dates with the assistance of adherence packaging.  . LDL goal <70 . A1c goal <6.5% . Blood pressure <140/90  Interventions: . Comprehensive medication review performed. . Take Eliquis twice daily vs 2 tabs once daily . Consider increasing atorvastatin to 20mg  daily  Patient Self Care Activities:  . Patient verbalizes understanding of plan to follow as described above, Self administers medications as prescribed, Calls pharmacy for medication refills, and Calls provider office for new concerns or questions  Initial goal documentation     . Take Eliquis 5mg  twice daily instead of 2 tablets once daily        Social Hx:  Retired from [employer]. One child."A bunch of grandchildren"   Her grand daughter helps her remember to take her meds (64 year old) Has a medicine box Takes bottles out as she needs   Pre-Diabetes   Recent Relevant Labs: Lab Results  Component Value Date/Time   HGBA1C 6.1 08/04/2019 11:31 AM   HGBA1C 6.2 (H) 08/15/2018 04:51 AM   Patient is currently controlled on the following medications: None  We discussed: diet and exercise extensively  Plan -Continue control with diet and exercise    Hypertension   BP today is: unable to assess due to phone visit  Office blood pressures are  BP Readings from Last 3 Encounters:  09/13/19 117/68  08/04/19 118/70  03/11/19 128/66   BP Goal <140/90  Patient has failed these meds in the past: None noted  Patient is currently controlled on the following medications: losartan 50mg  daily, hctz 25mg  every other Amanda Saunders, metoprolol succinate 50mg  daily  Metoprolol last fill date is 05/16/19 for 90 DS.  HCTZ: rx states to take every other Amanda Saunders, but patient only taking every Tu/Th Patient would benefit from adherence packaging  Patient checks BP at home infrequently (when symptomatic)  Patient  home BP readings are ranging: 118/60s  We discussed Proper blood pressure measurement technique  Plan -Check blood pressure twice a week and record readings  -Continue current medications     Hx of CVA (ASCVD)   Lipid Panel     Component Value Date/Time   CHOL 153 08/04/2019 1131   TRIG 68.0 08/04/2019 1131   HDL 47.50 08/04/2019 1131   CHOLHDL 3 08/04/2019 1131   VLDL 13.6 08/04/2019 1131   [place] 92 08/04/2019 1131     ASCVD 10-year risk: Hx of ASCVD  LDL goal <70  Patient has failed these meds in past: None noted Patient is currently uncontrolled on the following medications: aspirin 81mg , atorvastatin 10mg  daily  We discussed:  Importance of reducing risk of heart attack and stroke  Plan -Consider increasing atorvastatin to 20mg  daily (patient is agreeable to increasing)  -Continue current medications   Noncompaction cardiomyopathy; 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  Patient would benefit from adherence packaging.  We discussed:  half life elimination of eliquis and why it is best to take BID vs once daily  Plan -Take Eliquis twice daily versus once a Ladonne Sharples  -Continue current medications   Lupus    Patient has failed these meds in past: None noted  Patient is currently controlled on the following medications: hydroxychloroquine 200mg , methotrexate 50mg 0000000, folic acid 1mg   Everything is going well  Followed by Marella Chimes, PA-C (Added provider to patient's healthcare team) Next appt in May/June 2021  Plan -Continue current medications   Gout   08/04/19: Uric Acid = 6.2  Patient has failed these meds in past: None noted  Patient is currently controlled on the following medications: colchicine 0.6mg  PRN  Last used colchicine in January. Does not have any remaining Thinks shrimp may have been  cause of last gout flare Eats canned tuna  No more pain since last flare  We discussed:  foods/beverages that can exacerbate gout  Plan -Continue current medications   Pain    Patient has failed these meds in past: None noted  Patient is currently controlled on the following medications: tramadol 50, naproxen 550, gabapentin 100mg  #3 BID, acetaminophen 325mg   Gabapentin - for HA APAP - 1-2 times per week Naproxen - took a half tablet last night (wrist, knee, neck, ankle, back) Tramadol - last used a month ago (Dr. Lorelei Saunders encouraged not to use)   Plan -Follow up with Dr. Tomi Saunders -Consider having gabapentin prescribed as 300mg  BID vs #3 100mg  BID -Continue current medications   Miscellaneous Meclizine 25mg  (last used last week for vertigo)  Verbal consent obtained for [facility] enhanced pharmacy services (medication synchronization, adherence packaging, delivery coordination). A medication sync plan was created to allow patient to get all medications delivered once every 30 to 90 days per patient preference. Patient understands they have freedom to choose pharmacy and clinical pharmacist will coordinate care between all prescribers and [facility].

## 2019-09-30 NOTE — Patient Instructions (Addendum)
Visit Information  Goals Addressed            This Visit's Progress   . A1c goal less than 6.5%       -Pre-Diabetes a1c range is 5.7% to 6.4%. Your most recent a1c was 6.1% on 08/04/2019.  -Usually the full diabetes diagnosis is given when a1c reaches 6.5% or higher.     . Blood pressure goal less than 140/90      . Consider increasing atorvastatin to 20mg  daily       With your history it is important to reduce your risk of heart attack and stroke.  An LDL goal of less than 70 would be ideal.     . LDL goal less than 70      . Pharmacy Care Plan       CARE PLAN ENTRY  Current Barriers:  . Chronic Disease Management support, education, and care coordination needs related to PreDM, HTN, Hx of CVA, Noncompaction Cardiomyopathy, Lupus, Gout, Pain  Pharmacist Clinical Goal(s):  Marland Kitchen Over the next 90 days, patient will demonstrate Improved medication adherence as evidenced by appropriate fill dates with the assistance of adherence packaging.  . LDL goal <70 . A1c goal <6.5% . Blood pressure <140/90  Interventions: . Comprehensive medication review performed. . Take Eliquis twice daily vs 2 tabs once daily . Consider increasing atorvastatin to 20mg  daily  Patient Self Care Activities:  . Patient verbalizes understanding of plan to follow as described above, Self administers medications as prescribed, Calls pharmacy for medication refills, and Calls provider office for new concerns or questions  Initial goal documentation     . Take Eliquis 5mg  twice daily instead of 2 tablets once daily         Amanda Saunders was given information about Chronic Care Management services today including:  1. CCM service includes personalized support from designated clinical staff supervised by her physician, including individualized plan of care and coordination with other care providers 2. 24/7 contact phone numbers for assistance for urgent and routine care needs. 3. Standard insurance,  coinsurance, copays and deductibles apply for chronic care management only during months in which we provide at least 20 minutes of these services. Most insurances cover these services at 100%, however patients may be responsible for any copay, coinsurance and/or deductible if applicable. This service may help you avoid the need for more expensive face-to-face services. 4. Only one practitioner may furnish and bill the service in a calendar month. 5. The patient may stop CCM services at any time (effective at the end of the month) by phone call to the office staff.  Patient agreed to services and verbal consent obtained.   The patient verbalized understanding of instructions provided today and agreed to receive a mailed copy of patient instruction and/or educational materials. Telephone follow up appointment with pharmacy team member scheduled for: 11/30/2019  Amanda Saunders, PharmD Clinical Pharmacist [facility] [phone]    Low-Purine Eating Plan A low-purine eating plan involves making food choices to limit your intake of purine. Purine is a kind of uric acid. Too much uric acid in your blood can cause certain conditions, such as gout and kidney stones. Eating a low-purine diet can help control these conditions. What are tips for following this plan? Reading food labels   Avoid foods with saturated or Trans fat.  Check the ingredient list of grains-based foods, such as bread and cereal, to make sure that they contain whole grains.  Check the ingredient list of sauces or soups to make sure they do not contain meat or fish.  When choosing soft drinks, check the ingredient list to make sure they do not contain high-fructose corn syrup. Shopping  Buy plenty of fresh fruits and vegetables.  Avoid buying canned or fresh fish.  Buy dairy products labeled as low-fat or nonfat.  Avoid buying premade or processed foods. These foods are often high in fat,  salt (sodium), and added sugar. Cooking  Use olive oil instead of butter when cooking. Oils like olive oil, canola oil, and sunflower oil contain healthy fats. Meal planning  Learn which foods do or do not affect you. If you find out that a food tends to cause your gout symptoms to flare up, avoid eating that food. You can enjoy foods that do not cause problems. If you have any questions about a food item, talk with your dietitian or health care provider.  Limit foods high in fat, especially saturated fat. Fat makes it harder for your body to get rid of uric acid.  Choose foods that are lower in fat and are lean sources of protein. General guidelines  Limit alcohol intake to no more than 1 drink a Paiden Caraveo for nonpregnant women and 2 drinks a Eugenio Dollins for men. One drink equals 12 oz of beer, 5 oz of wine, or 1 oz of hard liquor. Alcohol can affect the way your body gets rid of uric acid.  Drink plenty of water to keep your urine clear or pale yellow. Fluids can help remove uric acid from your body.  If directed by your health care provider, take a vitamin C supplement.  Work with your health care provider and dietitian to develop a plan to achieve or maintain a healthy weight. Losing weight can help reduce uric acid in your blood. What foods are recommended? The items listed may not be a complete list. Talk with your dietitian about what dietary choices are best for you. Foods low in purines Foods low in purines do not need to be limited. These include:  All fruits.  All low-purine vegetables, pickles, and olives.  Breads, pasta, rice, cornbread, and popcorn. Cake and other baked goods.  All dairy foods.  Eggs, nuts, and nut butters.  Spices and condiments, such as salt, herbs, and vinegar.  Plant oils, butter, and margarine.  Water, sugar-free soft drinks, tea, coffee, and cocoa.  Vegetable-based soups, broths, sauces, and gravies. Foods moderate in purines Foods moderate in  purines should be limited to the amounts listed.   cup of asparagus, cauliflower, spinach, mushrooms, or green peas, each Brodi Nery.  2/3 cup uncooked oatmeal, each Jaidan Prevette.   cup dry wheat bran or wheat germ, each Arlenne Kimbley.  2-3 ounces of meat or poultry, each Ismar Yabut.  4-6 ounces of shellfish, such as crab, lobster, oysters, or shrimp, each Priyansh Pry.  1 cup cooked beans, peas, or lentils, each Krishana Lutze.  Soup, broths, or bouillon made from meat or fish. Limit these foods as much as possible. What foods are not recommended? The items listed may not be a complete list. Talk with your dietitian about what dietary choices are best for you. Limit your intake of foods high in purines, including:  Beer and other alcohol.  Meat-based gravy or sauce.  Canned or fresh fish, such as: ? Anchovies, sardines, herring, and tuna. ? Mussels and scallops. ? Codfish, trout, and haddock.  Berniece Salines.  Organ meats, such as: ? Liver or kidney. ? Tripe. ? Sweetbreads (  thymus gland or pancreas).  Wild Clinical biochemist.  Yeast or yeast extract supplements.  Drinks sweetened with high-fructose corn syrup. Summary  Eating a low-purine diet can help control conditions caused by too much uric acid in the body, such as gout or kidney stones.  Choose low-purine foods, limit alcohol, and limit foods high in fat.  You will learn over time which foods do or do not affect you. If you find out that a food tends to cause your gout symptoms to flare up, avoid eating that food. This information is not intended to replace advice given to you by your health care provider. Make sure you discuss any questions you have with your health care provider. Document Revised: 06/19/2017 Document Reviewed: 08/20/2016 Elsevier Patient Education  2020 [employer].

## 2019-10-04 ENCOUNTER — Encounter: Payer: Self-pay | Admitting: Family Medicine

## 2019-10-04 ENCOUNTER — Other Ambulatory Visit: Payer: Self-pay | Admitting: Family Medicine

## 2019-10-04 DIAGNOSIS — R42 Dizziness and giddiness: Secondary | ICD-10-CM

## 2019-10-04 DIAGNOSIS — E785 Hyperlipidemia, unspecified: Secondary | ICD-10-CM

## 2019-10-04 DIAGNOSIS — R072 Precordial pain: Secondary | ICD-10-CM

## 2019-10-04 DIAGNOSIS — M25562 Pain in left knee: Secondary | ICD-10-CM

## 2019-10-04 DIAGNOSIS — I10 Essential (primary) hypertension: Secondary | ICD-10-CM

## 2019-10-04 MED ORDER — NAPROXEN SODIUM 550 MG PO TABS: ORAL_TABLET | ORAL | 1 refills | Status: DC

## 2019-10-04 MED ORDER — MECLIZINE HCL 25 MG PO TABS: ORAL_TABLET | ORAL | 1 refills | Status: DC

## 2019-10-04 MED ORDER — METOPROLOL SUCCINATE ER 25 MG PO TB24: 25.0000 mg | ORAL_TABLET | Freq: Every day | ORAL | 3 refills | Status: DC

## 2019-10-04 MED ORDER — ATORVASTATIN CALCIUM 20 MG PO TABS: 20.0000 mg | ORAL_TABLET | Freq: Every day | ORAL | 3 refills | Status: DC

## 2019-10-04 MED ORDER — TRAMADOL HCL 50 MG PO TABS: 50.0000 mg | ORAL_TABLET | Freq: Three times a day (TID) | ORAL | 0 refills | Status: DC | PRN

## 2019-10-04 NOTE — Progress Notes (Signed)
-  Atorvastatin 20mg  (if agreeable to increase)  -metoprolol succinate 25mg   -tramadol 50mg  (don't know if you want pt to still have this available)  -naproxen 550mg   -Meclizine 25mg

## 2019-10-05 ENCOUNTER — Encounter: Payer: Self-pay | Admitting: Cardiology

## 2019-10-05 ENCOUNTER — Telehealth: Payer: Self-pay | Admitting: Neurology

## 2019-10-05 NOTE — Telephone Encounter (Signed)
error 

## 2019-10-05 NOTE — Telephone Encounter (Signed)
Patient's PCP office called wanting to see if Dr. Tomi Likens will consider writing a new prescription for gabapentin 300 MG to be taken 2 times daily. This would replace the current prescription but at the same dosage with fewer capsules.  [facility] on [address]

## 2019-10-06 ENCOUNTER — Other Ambulatory Visit: Payer: Self-pay | Admitting: Neurology

## 2019-10-06 DIAGNOSIS — M7989 Other specified soft tissue disorders: Secondary | ICD-10-CM | POA: Diagnosis not present

## 2019-10-06 DIAGNOSIS — M329 Systemic lupus erythematosus, unspecified: Secondary | ICD-10-CM | POA: Diagnosis not present

## 2019-10-06 MED ORDER — GABAPENTIN 300 MG PO CAPS: 300.0000 mg | ORAL_CAPSULE | Freq: Two times a day (BID) | ORAL | 5 refills | Status: DC

## 2019-10-06 NOTE — Telephone Encounter (Signed)
Prescription sent

## 2019-10-12 ENCOUNTER — Telehealth: Payer: Self-pay | Admitting: Cardiology

## 2019-10-12 MED ORDER — APIXABAN 5 MG PO TABS: 5.0000 mg | ORAL_TABLET | Freq: Two times a day (BID) | ORAL | 1 refills | Status: DC

## 2019-10-12 NOTE — Telephone Encounter (Signed)
New message   Patient needs a new prescription for   apixaban (ELIQUIS) 5 MG TABS tablet   Sent to [facility]

## 2019-10-25 ENCOUNTER — Encounter: Payer: Self-pay | Admitting: Family Medicine

## 2019-11-03 ENCOUNTER — Other Ambulatory Visit: Payer: Self-pay

## 2019-11-04 ENCOUNTER — Other Ambulatory Visit: Payer: Self-pay

## 2019-11-05 NOTE — Progress Notes (Signed)
[place] [facility] [address] [place], [place] [address] [phone]  Date:  11/07/2019   Name:  Amanda Saunders   DOB:  March 12, 1956   MRN:  [id number]  PCP:  Darreld Mclean, MD    Chief Complaint: Nutrition Counseling   History of Present Illness:  Amanda Saunders is a 64 y.o. very pleasant female patient who presents with the following:  Patient with history of lupus, stroke just over 1 year ago, hypertension, prediabetes, peripheral edema Here today for follow-up visit and to discuss diet and weight loss Last seen by myself in January She had an acute stroke in January 2020-MRI at that time showed acute watershed subcortical and small cortical infarcts on the right At her last visit, she had some left hand weakness and numbness across her forehead, but otherwise had recovered well She is taking Eliquis, statin, controlling blood sugar and blood pressure  Mammogram 1 year ago Colon cancer screen up-to-date Pap is up-to-date  She is interested in losing weight- she has tried a few diets but they have not really worked for her so far.  She is frustrated because despite her efforts she is not able to make her weight bulge She is interested in using Xenical, which I think is a reasonable idea for her  She just saw rheumatology- they are planning to change her methotrexate to a different medication likely, pending labs     Wt Readings from Last 3 Encounters:  11/07/19 204 lb (92.5 kg)  09/13/19 203 lb (92.1 kg)  08/04/19 205 lb (93 kg)   Weight was 197 lbs in 02/2018 Patient Active Problem List   Diagnosis Date Noted  . Systemic lupus erythematosus ([place]) 08/04/2019  . Hospital discharge follow-up 09/07/2018  . Acute ischemic stroke ([place]) 08/14/2018  . HTN (hypertension) 08/14/2018  . Pre-diabetes 04/03/2016  . History of CVA (cerebrovascular accident) 03/12/2016  . Chest pain at rest 03/12/2016  .  Peripheral edema 03/12/2016    Past Medical History:  Diagnosis Date  . Cardiomyopathy ([place])   . Hypertension   . Lupus ([place])   . PUD (peptic ulcer disease)   . Stroke ([place]) 08/13/2018  . Vertigo     Past Surgical History:  Procedure Laterality Date  . BACK SURGERY    . CARPAL TUNNEL RELEASE    . NECK SURGERY     2015    Social History   Tobacco Use  . Smoking status: Former Smoker    Quit date: 07/21/2004    Years since quitting: 15.3  . Smokeless tobacco: Never Used  Substance Use Topics  . Alcohol use: Yes    Comment: occasional  . Drug use: No    Family History  Problem Relation Age of Onset  . Hypertension Mother   . Arthritis Mother   . Heart failure Mother   . Stroke Mother   . Kidney disease Father   . Hypertension Father     Allergies  Allergen Reactions  . Codeine Nausea And Vomiting    Medication list has been reviewed and updated.  Current Outpatient Medications on File Prior to Visit  Medication Sig Dispense Refill  . acetaminophen (TYLENOL) 325 MG tablet Take 2 tablets (650 mg total) by mouth every 4 (four) hours as needed for mild pain (or temp > 37.5 C (99.5 F)).    Marland Kitchen apixaban (ELIQUIS) 5 MG TABS tablet Take 1 tablet (5 mg total) by  mouth 2 (two) times daily. 180 tablet 1  . aspirin EC 81 MG tablet Take 81 mg by mouth daily.    Marland Kitchen atorvastatin (LIPITOR) 20 MG tablet Take 1 tablet (20 mg total) by mouth daily. 90 tablet 3  . colchicine 0.6 MG tablet Take 2 pills once, then 1 an hour later as needed for gout.  Hold the rest of rx for later gout attack 30 tablet 0  . folic acid (FOLVITE) 1 MG tablet Take 1 mg by mouth daily.    Marland Kitchen gabapentin (NEURONTIN) 300 MG capsule Take 1 capsule (300 mg total) by mouth 2 (two) times daily. 60 capsule 5  . hydrochlorothiazide (HYDRODIURIL) 25 MG tablet Take 1 tablet (25 mg total) by mouth every other day. 45 tablet 3  . hydroxychloroquine (PLAQUENIL) 200 MG tablet Take 200 mg by mouth 2 (two) times daily.    .  meclizine (ANTIVERT) 25 MG tablet TAKE 1 TABLET BY MOUTH TWICE DAILY AS NEEDED FOR DIZZINESS 30 tablet 1  . Methotrexate Sodium (METHOTREXATE, PF,) 50 MG/2ML injection Inject 15 mg into the muscle once a week. 0.6 ml    . metoprolol succinate (TOPROL-XL) 25 MG 24 hr tablet Take 1 tablet (25 mg total) by mouth daily. 90 tablet 3  . naproxen sodium (ANAPROX) 550 MG tablet TAKE 1 TABLET BY MOUTH TWICE DAILY WITH MEALS AS NEEDED FOR PAIN- use sparingly 30 tablet 1  . traMADol (ULTRAM) 50 MG tablet Take 1 tablet (50 mg total) by mouth every 8 (eight) hours as needed. 30 tablet 0  . losartan (COZAAR) 50 MG tablet Take 1 tablet (50 mg total) by mouth daily. 90 tablet 3   No current facility-administered medications on file prior to visit.    Review of Systems:  As per HPI- otherwise negative.   Physical Examination: Vitals:   11/07/19 1114  BP: 114/76  Pulse: 63  Resp: 18  Temp: 97.7 F (36.5 C)  SpO2: 100%   Vitals:   11/07/19 1114  Weight: 204 lb (92.5 kg)  Height: 5\' 3"  (1.6 m)   Body mass index is 36.14 kg/m. Ideal Body Weight: Weight in (lb) to have BMI = 25: 140.8  GEN: no acute distress.  Obese, otherwise looks well HEENT: Atraumatic, Normocephalic.  Ears and Nose: No external deformity. CV: RRR, No M/G/R. No JVD. No thrill. No extra heart sounds. PULM: CTA B, no wheezes, crackles, rhonchi. No retractions. No resp. distress. No accessory muscle use. ABD: S, NT, ND, +BS. No rebound. No HSM. EXTR: No c/c/e PSYCH: Normally interactive. Conversant.    Assessment and Plan: Class 2 severe obesity due to excess calories with serious comorbidity and body mass index (BMI) of 36.0 to 36.9 in adult [facility]) - Plan: orlistat (XENICAL) 120 MG capsule, Amb Ref to Medical Weight Management  Encounter for screening mammogram for malignant neoplasm of breast - Plan: MM 3D SCREEN BREAST BILATERAL  Here today with concern of obesity.  Patient has a BMI of 36, which is moderately high.   However she has risk factors of hypertension and previous stroke, as well as prediabetes-weight loss would certainly be beneficial to her Referral to the weight and wellness center Prescribe Xenical, discussed how to use this medication.  Asked her to recheck with either myself in 8 weeks, or with the weight and wellness center for a weight check Order mammogram Moderate medical decision making today This visit occurred during the SARS-CoV-2 public health emergency.  Safety protocols were in place, including screening  questions prior to the visit, additional usage of staff PPE, and extensive cleaning of exam room while observing appropriate contact time as indicated for disinfecting solutions.    Signed Lamar Blinks, MD

## 2019-11-07 ENCOUNTER — Ambulatory Visit (HOSPITAL_BASED_OUTPATIENT_CLINIC_OR_DEPARTMENT_OTHER)
Admission: RE | Admit: 2019-11-07 | Discharge: 2019-11-07 | Disposition: A | Discharge status: Home / Self Care | Payer: Medicare Other | Source: Ambulatory Visit | Attending: Family Medicine | Admitting: Family Medicine

## 2019-11-07 ENCOUNTER — Other Ambulatory Visit: Payer: Self-pay

## 2019-11-07 ENCOUNTER — Encounter: Payer: Self-pay | Admitting: Family Medicine

## 2019-11-07 ENCOUNTER — Ambulatory Visit (INDEPENDENT_AMBULATORY_CARE_PROVIDER_SITE_OTHER): Payer: Medicare Other | Admitting: Family Medicine

## 2019-11-07 VITALS — BP 114/76 | HR 63 | Temp 97.7°F | Resp 18 | Ht 63.0 in | Wt 204.0 lb

## 2019-11-07 DIAGNOSIS — Z6836 Body mass index (BMI) 36.0-36.9, adult: Secondary | ICD-10-CM | POA: Diagnosis not present

## 2019-11-07 DIAGNOSIS — Z1231 Encounter for screening mammogram for malignant neoplasm of breast: Secondary | ICD-10-CM

## 2019-11-07 DIAGNOSIS — R945 Abnormal results of liver function studies: Secondary | ICD-10-CM | POA: Diagnosis not present

## 2019-11-07 MED ORDER — ORLISTAT 120 MG PO CAPS: 120.0000 mg | ORAL_CAPSULE | Freq: Three times a day (TID) | ORAL | 4 refills | Status: DC

## 2019-11-07 NOTE — Patient Instructions (Signed)
Good to see you today-  We will order a mammogram and set you up with the Weight and Wellness center  For now will start you on Xenical 120 mg- take with meals.  If you miss a meal, or if the meal does not contain any fat skip that dose  Please see me in 2 months (unless being see at weight and wellness) for a weight check

## 2019-11-08 ENCOUNTER — Telehealth: Payer: Self-pay

## 2019-11-08 ENCOUNTER — Encounter: Payer: Self-pay | Admitting: Family Medicine

## 2019-11-08 NOTE — Telephone Encounter (Signed)
Noted, will reach out to patient

## 2019-11-08 NOTE — Telephone Encounter (Signed)
Xenical not covered by [employer] insurance- preferred alternative is Qsymia. Please advise.

## 2019-11-10 DIAGNOSIS — M329 Systemic lupus erythematosus, unspecified: Secondary | ICD-10-CM | POA: Diagnosis not present

## 2019-11-10 DIAGNOSIS — M2550 Pain in unspecified joint: Secondary | ICD-10-CM | POA: Diagnosis not present

## 2019-11-10 DIAGNOSIS — R768 Other specified abnormal immunological findings in serum: Secondary | ICD-10-CM | POA: Diagnosis not present

## 2019-11-10 DIAGNOSIS — M7989 Other specified soft tissue disorders: Secondary | ICD-10-CM | POA: Diagnosis not present

## 2019-11-23 NOTE — Progress Notes (Signed)
Virtual Visit via Video Note The purpose of this virtual visit is to provide medical care while limiting exposure to the novel coronavirus.    Consent was obtained for video visit:  Yes.   Answered questions that patient had about telehealth interaction:  Yes.   I discussed the limitations, risks, security and privacy concerns of performing an evaluation and management service by telemedicine. I also discussed with the patient that there may be a patient responsible charge related to this service. The patient expressed understanding and agreed to proceed.  Pt location: Home Physician Location: office Name of referring provider:  Copland, Gay Filler, MD I connected with [name] at patients initiation/request on 11/24/2019 at 10:30 AM EDT by video enabled telemedicine application and verified that I am speaking with the correct person using two identifiers. Pt MRN:  [id number] Pt DOB:  1955/07/29 Video Participants:  [name];     History of Present Illness:  Amanda Saunders is a 64 year old left-handed woman with history of stroke who follows up for headache.  UPDATE: Since starting gabapentin, she reports some improvement in headaches.  They are less intense and occur twice a week.  They last a day.      Current NSAIDs: ASA 81mg , naproxen 550mg  Current analgesic: Tylenol Current antihypertensive: HCTZ, losartan, Toprol XL Current antiepileptic:  gabapentin 300mg  twice daily Current antihistamine:  Meclizine Other medication:  Eliquis  HISTORY: Headache: On 03/03/19, she developed a sudden severe pounding right sided headache (radiating across forehead) with right facial burning. There was associated nausea, sometimes vomiting, photophobia and phonophobia.No visual disturbance. She felt that her equilibrium was off. She went to the ED on 03/11/19 for further evaluation. CT head personally reviewed and negative for acute intracranial abnormality such as  hemorrhage or infarct. CBC and BMP were unremarkable. She was given a headache cocktail of Reglan and Benadryl and discharged home. Headaches are still present but not as severe. She reported temperature of 99. They lessened on 8/24. Headache is mild to moderate, lasting a couple of hours off and on, daily. Sometimes she feels shaking in her head. Reports a little bit of neck pain.   04/17/2019 MRI w wo and MRA of head: advanced widespread chronic small vessel ischemic changes as well as occlusion of right distal M1 segment and severe stenoses of left M2 branches, A2 branches and mild irregularity of the PCA branches, but no acute intracranial abnormality. 04/13/2019 Sed Rate 11.  History of CVA: She was admitted to [facility] on 08/14/18 for increased left arm numbness and weakness with left sided tremor as well as headache, dizziness with nausea and vomiting. CT of head was personally reviewed and showed no acute findings. MRI of brain personally reviewed and demonstrated scattered acute watershed subcortical and small cortical infarcts in the right MCA/ACA and MCA/PCA areas. MRA of head personally reviewed showed severe stenosis or short segment occlusion of the distal right MCA M1 and proximal M2 segments as well as proximal severe stenosis or short segment occlusion of left MCA M2 segment. Carotid doppler showed no hemodynamically significant stenosis. 2D echocardiogram showed EF 45-50%. LDL was 77. Hgb A1c was 6.2. ASA 81mg  daily was switched to ASA 325mg  and Plavix 75mg  daily for 3 months with plan to subsequently continue Plavix alone. She was continued on atorvastatin 40mg  daily.   Past medication:  tramadol  Past Medical History: Past Medical History:  Diagnosis Date  . Cardiomyopathy ([place])   . Hypertension   .  Lupus ([place])   . PUD (peptic ulcer disease)   . Stroke ([place]) 08/13/2018  . Vertigo     Medications: Outpatient Encounter Medications as of 11/24/2019    Medication Sig Note  . acetaminophen (TYLENOL) 325 MG tablet Take 2 tablets (650 mg total) by mouth every 4 (four) hours as needed for mild pain (or temp > 37.5 C (99.5 F)).   Marland Kitchen apixaban (ELIQUIS) 5 MG TABS tablet Take 1 tablet (5 mg total) by mouth 2 (two) times daily.   Marland Kitchen aspirin EC 81 MG tablet Take 81 mg by mouth daily.   Marland Kitchen atorvastatin (LIPITOR) 20 MG tablet Take 1 tablet (20 mg total) by mouth daily.   . folic acid (FOLVITE) 1 MG tablet Take 1 mg by mouth daily.   Marland Kitchen gabapentin (NEURONTIN) 300 MG capsule Take 1 capsule (300 mg total) by mouth 2 (two) times daily.   . hydrochlorothiazide (HYDRODIURIL) 25 MG tablet Take 1 tablet (25 mg total) by mouth every other day. 10/03/2019: Patient only taking on Tu/Th  . hydroxychloroquine (PLAQUENIL) 200 MG tablet Take 200 mg by mouth 2 (two) times daily.   . meclizine (ANTIVERT) 25 MG tablet TAKE 1 TABLET BY MOUTH TWICE DAILY AS NEEDED FOR DIZZINESS   . metoprolol succinate (TOPROL-XL) 25 MG 24 hr tablet Take 1 tablet (25 mg total) by mouth daily.   . naproxen sodium (ANAPROX) 550 MG tablet TAKE 1 TABLET BY MOUTH TWICE DAILY WITH MEALS AS NEEDED FOR PAIN- use sparingly   . colchicine 0.6 MG tablet Take 2 pills once, then 1 an hour later as needed for gout.  Hold the rest of rx for later gout attack (Patient not taking: Reported on 11/24/2019) 09/30/2019: Patient doesn't have this available  . losartan (COZAAR) 50 MG tablet Take 1 tablet (50 mg total) by mouth daily.   . Methotrexate Sodium (METHOTREXATE, PF,) 50 MG/2ML injection Inject 15 mg into the muscle once a week. 0.6 ml   . ORENCIA CLICKJECT 0000000 MG/ML SOAJ Inject 1 Syringe into the skin once a week.   . orlistat (XENICAL) 120 MG capsule Take 1 capsule (120 mg total) by mouth 3 (three) times daily with meals. (Patient not taking: Reported on 11/24/2019)   . traMADol (ULTRAM) 50 MG tablet Take 1 tablet (50 mg total) by mouth every 8 (eight) hours as needed. (Patient not taking: Reported on 11/24/2019)     No facility-administered encounter medications on file as of 11/24/2019.    Allergies: Allergies  Allergen Reactions  . Codeine Nausea And Vomiting    Family History: Family History  Problem Relation Age of Onset  . Hypertension Mother   . Arthritis Mother   . Heart failure Mother   . Stroke Mother   . Kidney disease Father   . Hypertension Father     Social History: Social History   Socioeconomic History  . Marital status: Single    Spouse name: Not on file  . Number of children: 1  . Years of education: 68  . Highest education level: Not on file  Occupational History  . Occupation: retired    Comment: retired  Tobacco Use  . Smoking status: Former Smoker    Quit date: 07/21/2004    Years since quitting: 15.3  . Smokeless tobacco: Never Used  Substance and Sexual Activity  . Alcohol use: Yes    Comment: occasional  . Drug use: No  . Sexual activity: Not on file  Other Topics Concern  . Not on  file  Social History Narrative   Lives one level with son/grandchildren; Left handed; high school grad; retired; walks for exercise; no caffeine   Social Determinants of Radio broadcast assistant Strain:   . Difficulty of Paying Living Expenses:   Food Insecurity:   . Worried About Charity fundraiser in the Last Year:   . Arboriculturist in the Last Year:   Transportation Needs:   . Film/video editor (Medical):   Marland Kitchen Lack of Transportation (Non-Medical):   Physical Activity:   . Days of Exercise per Week:   . Minutes of Exercise per Session:   Stress:   . Feeling of Stress :   Social Connections:   . Frequency of Communication with Friends and Family:   . Frequency of Social Gatherings with Friends and Family:   . Attends Religious Services:   . Active Member of Clubs or Organizations:   . Attends Archivist Meetings:   Marland Kitchen Marital Status:   Intimate Partner Violence:   . Fear of Current or Ex-Partner:   . Emotionally Abused:   Marland Kitchen Physically  Abused:   . Sexually Abused:     Observations/Objective:   Height 5\' 3"  (1.6 m), weight 205 lb (93 kg). No acute distress.  Alert and oriented.  Speech fluent and not dysarthric.  Language intact.  Eyes orthophoric on primary gaze.  Face symmetric.  Assessment and Plan:   1.  Right sided headache.   2.  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  Severe intracranial stenoses 4.  HTN 5.  HLD 6.  Type 2 diabetes mellitus  1.  Titrate gabapentin up to 600mg  twice daily.  She will continue 300mg  capsule twice daily but add 100mg  twice daily every week until 600mg  twice daily. 2.  Tylenol as needed (naproxen last resort) 3.  Secondary stroke prevention as managed by PCP and cardiology:  ASA 81mg  daily, Eliquis, statin therapy, blood pressure control, glycemic control. 4.  Follow up in 4 months.  Follow Up Instructions:    -I discussed the assessment and treatment plan with the patient. The patient was provided an opportunity to ask questions and all were answered. The patient agreed with the plan and demonstrated an understanding of the instructions.   The patient was advised to call back or seek an in-person evaluation if the symptoms worsen or if the condition fails to improve as anticipated.

## 2019-11-24 ENCOUNTER — Encounter: Payer: Self-pay | Admitting: Neurology

## 2019-11-24 ENCOUNTER — Other Ambulatory Visit: Payer: Self-pay

## 2019-11-24 ENCOUNTER — Telehealth (INDEPENDENT_AMBULATORY_CARE_PROVIDER_SITE_OTHER): Payer: Medicare Other | Admitting: Neurology

## 2019-11-24 VITALS — Ht 63.0 in | Wt 205.0 lb

## 2019-11-24 DIAGNOSIS — E785 Hyperlipidemia, unspecified: Secondary | ICD-10-CM

## 2019-11-24 DIAGNOSIS — I10 Essential (primary) hypertension: Secondary | ICD-10-CM | POA: Diagnosis not present

## 2019-11-24 DIAGNOSIS — R519 Headache, unspecified: Secondary | ICD-10-CM

## 2019-11-24 DIAGNOSIS — Z8673 Personal history of transient ischemic attack (TIA), and cerebral infarction without residual deficits: Secondary | ICD-10-CM | POA: Diagnosis not present

## 2019-11-24 MED ORDER — GABAPENTIN 100 MG PO CAPS: ORAL_CAPSULE | ORAL | 0 refills | Status: DC

## 2019-11-24 NOTE — Patient Instructions (Signed)
1.  Continue gabapentin 300mg  capsule, 1 capsule twice daily. 2.  Start gabapentin 100mg  capsule in addition to the 300mg  capsule:  Take 1 capsule twice daily for a week  Then 2 capsules twice daily for a week  Then 3 capsules twice daily You will then be taking one 300mg  capsule and three 100mg  capsules twice daily (total 600mg  twice daily). Contact me for refill and I will prescribe you 600mg  capsule twice daily. 3.  Follow up in 4 months.

## 2019-11-28 ENCOUNTER — Other Ambulatory Visit: Payer: Self-pay

## 2019-11-28 ENCOUNTER — Ambulatory Visit: Payer: Self-pay | Admitting: Pharmacist

## 2019-11-28 DIAGNOSIS — R609 Edema, unspecified: Secondary | ICD-10-CM

## 2019-11-28 DIAGNOSIS — M25562 Pain in left knee: Secondary | ICD-10-CM

## 2019-11-28 DIAGNOSIS — I428 Other cardiomyopathies: Secondary | ICD-10-CM

## 2019-11-28 DIAGNOSIS — R42 Dizziness and giddiness: Secondary | ICD-10-CM

## 2019-11-28 MED ORDER — HYDROCHLOROTHIAZIDE 25 MG PO TABS: 25.0000 mg | ORAL_TABLET | ORAL | 3 refills | Status: DC

## 2019-11-28 MED ORDER — NAPROXEN SODIUM 550 MG PO TABS: ORAL_TABLET | ORAL | 0 refills | Status: DC

## 2019-11-28 MED ORDER — MECLIZINE HCL 25 MG PO TABS: ORAL_TABLET | ORAL | 1 refills | Status: DC

## 2019-11-28 MED ORDER — LOSARTAN POTASSIUM 50 MG PO TABS: 50.0000 mg | ORAL_TABLET | Freq: Every day | ORAL | 3 refills | Status: DC

## 2019-11-28 NOTE — Chronic Care Management (AMB) (Signed)
Reviewed chart for medication changes ahead of medication coordination call.  OVs, Consults, or hospital visits since last care coordination call/Pharmacist visit.  11/07/19: Visit w/ Dr. Lorelei Pont - Discussion about obesity. Referral placed to weight management. Pt interested in Xenical, but it was not covered under patient's insurance. Recommended pt to use Alli OTC instead. Noted rheumatology may plan to change from methotrexate to alternative medication pending labs.  Ordered mammo. RTC in 8 weeks with Dr. Lorelei Pont or Weight Mangement   11/24/19: Neuro visit w/ Dr. Tomi Likens - New onset headache. Continue gabapentin 300mg  twice daily and start gabapentin 100mg  twice daily x1 week, then increase to 2 caps twice daily x1 week, then increase to 3 caps twice daily. Totaling 600mg  twice daily. When refill needed, provider will prescribe 600mg  tablet twice daily. RTC in 3 months.   No medication changes indicated OR if recent visit, treatment plan here.  BP Readings from Last 3 Encounters:  11/07/19 114/76  09/13/19 117/68  08/04/19 118/70    Lab Results  Component Value Date   HGBA1C 6.1 08/04/2019     Patient obtains medications through Adherence Packaging  90 Days   Patient is due for next adherence delivery on: 11/29/19. Called patient and reviewed medications and coordinated delivery.  This delivery to include: Metoprolol 20mg  Er daily-Breakfast Gabapentin 300mg  twice daily- Breakfast, Bedtime  Eliquis 5mg  twice daily -Breakfast, Bedtime  Hydroxychlor 200mg  twice daily -Breakfast, Bedtime  Hydrochlorothiazide 25mg  -Monday, Wednesday, Friday -Breakfast  Atorvastatin 20mg  -Bedtime  Naproxen Sodium 550mg  as needed -Vial   Meclizine 25mg twice daily as needed-Vial   Patient needs refills for hctz.  Confirmed delivery date of 11/29/19, advised patient that pharmacy will contact them the morning of delivery.

## 2019-11-30 ENCOUNTER — Ambulatory Visit: Payer: Medicare Other | Admitting: Pharmacist

## 2019-11-30 ENCOUNTER — Other Ambulatory Visit: Payer: Self-pay | Admitting: Family Medicine

## 2019-11-30 ENCOUNTER — Other Ambulatory Visit: Payer: Self-pay

## 2019-11-30 DIAGNOSIS — I10 Essential (primary) hypertension: Secondary | ICD-10-CM

## 2019-11-30 DIAGNOSIS — E785 Hyperlipidemia, unspecified: Secondary | ICD-10-CM

## 2019-11-30 NOTE — Chronic Care Management (AMB) (Signed)
Chronic Care Management Pharmacy  Name: Amanda Saunders  MRN: [id number] DOB: October 08, 1955  Chief Complaint/ HPI  Amanda Saunders,  64 y.o. , female presents for their Follow-Up CCM visit with the clinical pharmacist via telephone due to COVID-19 Pandemic.  PCP : Darreld Mclean, MD  Their chronic conditions include: PreDM, HTN, Hx of CVA, Noncompaction Cardiomyopathy, Lupus, Gout, Pain  Office Visits: 11/07/19: Visit w/ Dr. Lorelei Pont - Discussion about obesity. Referral placed to weight management. Pt interested in Xenical, but it was not covered under patient's insurance. Recommended pt to use Alli OTC instead. Noted rheumatology may plan to change from methotrexate to alternative medication pending labs.  Ordered mammo. RTC in 8 weeks with Dr. Lorelei Pont or Weight Mangement   Consult Visit: 11/24/19: Neuro visit w/ Dr. Tomi Likens - New onset headache. Continue gabapentin 300mg  twice daily and start gabapentin 100mg  twice daily x1 week, then increase to 2 caps twice daily x1 week, then increase to 3 caps twice daily. Totaling 600mg  twice daily. When refill needed, provider will prescribe 600mg  tablet twice daily. RTC in 3 months.    Medications: Outpatient Encounter Medications as of 11/30/2019  Medication Sig Note  . acetaminophen (TYLENOL) 325 MG tablet Take 2 tablets (650 mg total) by mouth every 4 (four) hours as needed for mild pain (or temp > 37.5 C (99.5 F)).   Marland Kitchen apixaban (ELIQUIS) 5 MG TABS tablet Take 1 tablet (5 mg total) by mouth 2 (two) times daily.   Marland Kitchen aspirin EC 81 MG tablet Take 81 mg by mouth daily.   Marland Kitchen atorvastatin (LIPITOR) 20 MG tablet Take 1 tablet (20 mg total) by mouth daily.   . colchicine 0.6 MG tablet Take 2 pills once, then 1 an hour later as needed for gout.  Hold the rest of rx for later gout attack (Patient not taking: Reported on 11/24/2019) 09/30/2019: Patient doesn't have this available  . folic acid (FOLVITE) 1 MG tablet Take 1 mg by mouth daily.   Marland Kitchen  gabapentin (NEURONTIN) 100 MG capsule Take 1 capsule twice daily for a week, then 2 capsules twice daily for a week, then 3 capsules twice daily.  Take in addition to 300mg  capsule twice daily.  Contact office for refill.   . gabapentin (NEURONTIN) 300 MG capsule Take 1 capsule (300 mg total) by mouth 2 (two) times daily.   . hydrochlorothiazide (HYDRODIURIL) 25 MG tablet Take 1 tablet (25 mg total) by mouth every other Arash Karstens.   . hydroxychloroquine (PLAQUENIL) 200 MG tablet Take 200 mg by mouth 2 (two) times daily.   Marland Kitchen losartan (COZAAR) 50 MG tablet Take 1 tablet (50 mg total) by mouth daily.   . meclizine (ANTIVERT) 25 MG tablet TAKE 1 TABLET BY MOUTH TWICE DAILY AS NEEDED FOR DIZZINESS   . Methotrexate Sodium (METHOTREXATE, PF,) 50 MG/2ML injection Inject 15 mg into the muscle once a week. 0.6 ml   . metoprolol succinate (TOPROL-XL) 25 MG 24 hr tablet Take 1 tablet (25 mg total) by mouth daily.   . naproxen sodium (ANAPROX) 550 MG tablet TAKE 1 TABLET BY MOUTH TWICE DAILY WITH MEALS AS NEEDED FOR PAIN- use sparingly   . ORENCIA CLICKJECT 0000000 MG/ML SOAJ Inject 1 Syringe into the skin once a week.   . orlistat (XENICAL) 120 MG capsule Take 1 capsule (120 mg total) by mouth 3 (three) times daily with meals. (Patient not taking: Reported on 11/24/2019)   . traMADol (ULTRAM) 50 MG tablet Take 1 tablet (50  mg total) by mouth every 8 (eight) hours as needed. (Patient not taking: Reported on 11/24/2019)    No facility-administered encounter medications on file as of 11/30/2019.   SDOH Screenings   Alcohol Screen:   . Last Alcohol Screening Score (AUDIT):   Depression (PHQ2-9): Low Risk   . PHQ-2 Score: 0  Financial Resource Strain: Low Risk   . Difficulty of Paying Living Expenses: Not very hard  Food Insecurity:   . Worried About Charity fundraiser in the Last Year:   . [place] in the Last Year:   Housing:   . Last Housing Risk Score:   Physical Activity:   . Days of Exercise per Week:     . Minutes of Exercise per Session:   Social Connections:   . Frequency of Communication with Friends and Family:   . Frequency of Social Gatherings with Friends and Family:   . Attends Religious Services:   . Active Member of Clubs or Organizations:   . Attends Archivist Meetings:   Marland Kitchen Marital Status:   Stress:   . Feeling of Stress :   Tobacco Use: Medium Risk  . Smoking Tobacco Use: Former Smoker  . Smokeless Tobacco Use: Never Used  Transportation Needs:   . Film/video editor (Medical):   Marland Kitchen Lack of Transportation (Non-Medical):       Current Diagnosis/Assessment:  Goals Addressed            This Visit's Progress   . Blood pressure goal less than 140/90   On track   . COMPLETED: Consider increasing atorvastatin to 20mg  daily       Dr. Lorelei Pont agreed to increase atorvastatin to 20mg  daily    . Pharmacy Care Plan       CARE PLAN ENTRY  Current Barriers:  . Chronic Disease Management support, education, and care coordination needs related to PreDM, HTN, Hx of CVA, Noncompaction Cardiomyopathy, Lupus, Gout, Pain   Hypertension . Pharmacist Clinical Goal(s): o Over the next 180 days, patient will work with PharmD and providers to maintain BP goal <140/90 . Current regimen:  o Losartan 50mg  daily, hctz 25mg  every other Burak Zerbe, metoprolol succinate 50mg  daily . Interventions: o Coordinating patient placement in adherence packaging to assist with adherence  . Patient self care activities - Over the next 180 days, patient will: o Check BP twice a week, document, and provide at future appointments o Ensure daily salt intake < 2300 mg/Shelbey Spindler  Hyperlipidemia . Pharmacist Clinical Goal(s): o Over the next 180 days, patient will work with PharmD and providers to achieve LDL goal <70 . Current regimen:  o Atorvastatin 20mg  daily . Interventions: o Collaboration with provider regarding medication management (atorvastatin increase to 20mg  daily) . Patient self care  activities - Over the next 180 days, patient will: o Maintain cholesterol medication regimen.   Pre-Diabetes . Pharmacist Clinical Goal(s): o Over the next 180 days, patient will work with PharmD and providers to maintain A1c goal <6.5% . Current regimen:  o Diet and exercise management   . Patient self care activities - Over the next 180 days, patient will: o Continue management with diet and exercise  Health Maintenance  . Pharmacist Clinical Goal(s) o Over the next 180 days, patient will work with PharmD and providers to Remain up to date on health maintenance  . Interventions: o Recommended patient complete Shingrix vaccine series . Patient self care activities - Over the next 180 days, patient will: o  Complete COVID Vaccine series o Complete Shingrix vaccine series if agreeable  Medication management . Pharmacist Clinical Goal(s): o Over the next 90 days, patient will work with PharmD and providers to achieve optimal medication adherence . Current pharmacy: [facility] . Interventions o Comprehensive medication review performed. o Utilize [facility] pharmacy for medication synchronization, packaging and delivery . Patient self care activities - Over the next 90 days, patient will: o Focus on medication adherence by filling medications appropriately  o Take medications as prescribed o Report any questions or concerns to PharmD and/or provider(s)  Please see past updates related to this goal by clicking on the "Past Updates" button in the selected goal      . Take Eliquis 5mg  twice daily instead of 2 tablets once daily   On track     Social Hx:  Retired from [employer]. One child."A bunch of grandchildren"   Her grand daughter helps her remember to take her meds (64 year old) Has a medicine box Takes bottles out as she needs    Hypertension   BP today is: unable to assess due to phone visit  Office blood pressures are  BP Readings from Last 3 Encounters:  11/07/19  114/76  09/13/19 117/68  08/04/19 118/70   BP Goal <140/90  Patient has failed these meds in the past: None noted  Patient is currently controlled on the following medications: losartan 50mg  daily, hctz 25mg  every other Jovie Swanner, metoprolol succinate 50mg  daily  From 09/30/19 Visit Metoprolol last fill date is 05/16/19 for 90 DS.  HCTZ: rx states to take every other Minnette Merida, but patient only taking every Tu/Th Patient would benefit from adherence packaging  Update Feels BP is doing ok.   Patient checks BP at home infrequently (when symptomatic)  Patient home BP readings are ranging: States her BP was 117/73 yesterday  Plan -Check blood pressure twice a week and record readings  -Continue current medications     Hx of CVA (ASCVD)   Lipid Panel     Component Value Date/Time   CHOL 153 08/04/2019 1131   TRIG 68.0 08/04/2019 1131   HDL 47.50 08/04/2019 1131   CHOLHDL 3 08/04/2019 1131   VLDL 13.6 08/04/2019 1131   [place] 92 08/04/2019 1131     ASCVD 10-year risk: Hx of ASCVD  LDL goal <70  Patient has failed these meds in past: None noted Patient is currently uncontrolled on the following medications: aspirin 81mg , atorvastatin 20mg  daily  Dr. Lorelei Pont was agreeable to increasing atorvastatin. Patient has been taking it consistently. 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  Patient would benefit from adherence packaging.  Update Patient is now taking Eliquis twice daily as prescribed. She remembers to take second dose.   Plan -Continue current medications   Lupus    Patient has failed these meds in past: None noted  Patient is  currently controlled on the following medications: hydroxychloroquine 200mg , Orencia  folic acid 1mg   From 09/30/19 Visit Everything is going well Followed by Marella Chimes, PA-C (Added provider to patient's healthcare team) Next appt in May/June 2021  Update Started Orencia yesterday and was shown how to use it. Coming  from [facility] Taking once weekly on Tuesdays. Orencia replaces methotrexate  Plan -Continue current medications     Pain    Patient has failed these meds in past: None noted  Patient is currently controlled on the following medications: tramadol 50, naproxen 550, gabapentin 100mg  #3 BID, acetaminophen 325mg   From 09/30/19 Visit Gabapentin - for HA APAP - 1-2 times per week Naproxen - took a half tablet last night (wrist, knee, neck, ankle, back) Tramadol - last used a month ago (Dr. Lorelei Pont encouraged not to use)  Update Dr. Tomi Likens agreeable to giving patient gabapentin 300mg  twice daily. Dr. Tomi Likens is titrating her regimen up to 600mg  twice daily.  Plan -Continue current medications    Obesity    Patient has failed these meds in past: Xenical (cost) Patient is currently uncontrolled on the following medications: None  Dr. Lorelei Pont recommended patient to try Alli OTC. Patient has not started it yet, but states she will soon  We discussed:  Her letting me know if I need to help coordinate scheduling with the healthy weight clinic. She states she is fine for now and will reach out when needed or decide to work with Dr. Lorelei Pont on weight loss  Plan -Continue current management  Vaccines   Reviewed and discussed patient's vaccination history.    Immunization History  Administered Date(s) Administered  . Tdap 03/12/2016   Patient received 1st COVID Vaccine.  Moderna #1 - 11/17/19 Scheduled to receive #2 on 12/17/19 Received from [facility] -Recommended patient receive Shingrix vaccine in pharmacy.     Miscellaneous Meclizine 25mg  (last  used last week for vertigo)  Meds to D/C from list  Methotrexate (change in therapy to Orencia) Colchicine (no longer taking) Tramadol (no longer taking) Xenical (cost)    [facility] Reviewed patient's [facility] medication and Epic medication profile assuring there are no discrepancies or gaps in therapy. Confirmed all fill dates appropriate and verified with patient that there is a sufficient quantity of all prescribed medications at home. Informed patient to call me any time if needing medications before scheduled deliveries. The anticipated medication sync date is 12/25/19. Receiving a 30 Kassidy Dockendorf supply vs 90 Braxley Balandran supply to get Losartan synced up with other medications for packaing.

## 2019-11-30 NOTE — Patient Instructions (Signed)
Visit Information  Goals Addressed            This Visit's Progress   . Blood pressure goal less than 140/90   On track   . COMPLETED: Consider increasing atorvastatin to 20mg  daily       Dr. Lorelei Pont agreed to increase atorvastatin to 20mg  daily    . Pharmacy Care Plan       CARE PLAN ENTRY  Current Barriers:  . Chronic Disease Management support, education, and care coordination needs related to PreDM, HTN, Hx of CVA, Noncompaction Cardiomyopathy, Lupus, Gout, Pain   Hypertension . Pharmacist Clinical Goal(s): o Over the next 180 days, patient will work with PharmD and providers to maintain BP goal <140/90 . Current regimen:  o Losartan 50mg  daily, hctz 25mg  every other Tijuana Scheidegger, metoprolol succinate 50mg  daily . Interventions: o Coordinating patient placement in adherence packaging to assist with adherence  . Patient self care activities - Over the next 180 days, patient will: o Check BP twice a week, document, and provide at future appointments o Ensure daily salt intake < 2300 mg/Daiveon Markman  Hyperlipidemia . Pharmacist Clinical Goal(s): o Over the next 180 days, patient will work with PharmD and providers to achieve LDL goal <70 . Current regimen:  o Atorvastatin 20mg  daily . Interventions: o Collaboration with provider regarding medication management (atorvastatin increase to 20mg  daily) . Patient self care activities - Over the next 180 days, patient will: o Maintain cholesterol medication regimen.   Pre-Diabetes . Pharmacist Clinical Goal(s): o Over the next 180 days, patient will work with PharmD and providers to maintain A1c goal <6.5% . Current regimen:  o Diet and exercise management   . Patient self care activities - Over the next 180 days, patient will: o Continue management with diet and exercise  Health Maintenance  . Pharmacist Clinical Goal(s) o Over the next 180 days, patient will work with PharmD and providers to Remain up to date on health maintenance   . Interventions: o Recommended patient complete Shingrix vaccine series . Patient self care activities - Over the next 180 days, patient will: o Complete COVID Vaccine series o Complete Shingrix vaccine series if agreeable  Medication management . Pharmacist Clinical Goal(s): o Over the next 90 days, patient will work with PharmD and providers to achieve optimal medication adherence . Current pharmacy: [facility] . Interventions o Comprehensive medication review performed. o Utilize [facility] pharmacy for medication synchronization, packaging and delivery . Patient self care activities - Over the next 90 days, patient will: o Focus on medication adherence by filling medications appropriately  o Take medications as prescribed o Report any questions or concerns to PharmD and/or provider(s)  Please see past updates related to this goal by clicking on the "Past Updates" button in the selected goal      . Take Eliquis 5mg  twice daily instead of 2 tablets once daily   On track      The patient verbalized understanding of instructions provided today and agreed to receive a mailed copy of patient instruction and/or educational materials.  Telephone follow up appointment with pharmacy team member scheduled for: 05/30/2020  Melvenia Beam Muhsin Doris, PharmD Clinical Pharmacist [facility] [phone]   Zoster Vaccine, Recombinant injection What is this medicine? ZOSTER VACCINE (ZOS ter vak SEEN) is used to prevent shingles in adults 63 years old and over. This vaccine is not used to treat shingles or nerve pain from shingles. This medicine may be used for other purposes; ask  your health care provider or pharmacist if you have questions. COMMON BRAND NAME(S): [facility] What should I tell my health care provider before I take this medicine? They need to know if you have any of these conditions:  blood disorders or disease  cancer like leukemia or lymphoma  immune  system problems or therapy  an unusual or allergic reaction to vaccines, other medications, foods, dyes, or preservatives  pregnant or trying to get pregnant  breast-feeding How should I use this medicine? This vaccine is for injection in a muscle. It is given by a health care professional. Talk to your pediatrician regarding the use of this medicine in children. This medicine is not approved for use in children. Overdosage: If you think you have taken too much of this medicine contact a poison control center or emergency room at once. NOTE: This medicine is only for you. Do not share this medicine with others. What if I miss a dose? Keep appointments for follow-up (booster) doses as directed. It is important not to miss your dose. Call your doctor or health care professional if you are unable to keep an appointment. What may interact with this medicine?  medicines that suppress your immune system  medicines to treat cancer  steroid medicines like prednisone or cortisone This list may not describe all possible interactions. Give your health care provider a list of all the medicines, herbs, non-prescription drugs, or dietary supplements you use. Also tell them if you smoke, drink alcohol, or use illegal drugs. Some items may interact with your medicine. What should I watch for while using this medicine? Visit your doctor for regular check ups. This vaccine, like all vaccines, may not fully protect everyone. What side effects may I notice from receiving this medicine? Side effects that you should report to your doctor or health care professional as soon as possible:  allergic reactions like skin rash, itching or hives, swelling of the face, lips, or tongue  breathing problems Side effects that usually do not require medical attention (report these to your doctor or health care professional if they continue or are bothersome):  chills  headache  fever  nausea, vomiting  redness,  warmth, pain, swelling or itching at site where injected  tiredness This list may not describe all possible side effects. Call your doctor for medical advice about side effects. You may report side effects to FDA at 1-800-FDA-1088. Where should I keep my medicine? This vaccine is only given in a clinic, pharmacy, doctor's office, or other health care setting and will not be stored at home. NOTE: This sheet is a summary. It may not cover all possible information. If you have questions about this medicine, talk to your doctor, pharmacist, or health care provider.  2020 Elsevier/Gold Standard (2017-02-16 13:20:30)

## 2019-12-09 ENCOUNTER — Telehealth: Payer: Self-pay | Admitting: Neurology

## 2019-12-09 NOTE — Telephone Encounter (Signed)
Please advise 

## 2019-12-09 NOTE — Telephone Encounter (Signed)
Pt states that she is still having headaches and medication is not working. She would like to speak to someone   Last visit was a video visit  11-24-19 and the next appt is 04-03-20 for a in office visit  Please call patient

## 2019-12-09 NOTE — Telephone Encounter (Signed)
Pt advised of Dr.Jaffe note below.  

## 2019-12-09 NOTE — Telephone Encounter (Signed)
Prescribe her gabapentin 600mg  capsule, 1 capsule twice daily.  She just titrated up to this dose and she needs to give it some time.  I recommend giving it 6 weeks on this dose before determining if we need to make changes.

## 2019-12-16 NOTE — Progress Notes (Signed)
I connected with Salena today by telephone and verified that I am speaking with the correct person using two identifiers. Location patient: home Location provider: work Persons participating in the virtual visit: patient, Therapist, sports.   I discussed the limitations, risks, security and privacy concerns of performing an evaluation and management service by telephone and the availability of in person appointments. I also discussed with the patient that there may be a patient responsible charge related to this service. The patient expressed understanding and verbally consented to this telephonic visit.    Interactive audio and video telecommunications were attempted between RN and patient, however failed, due to patient having technical difficulties OR patient did not have access to video capability.  We continued and completed visit with audio only.  Some vital signs may be absent or patient reported.    Subjective:   Amanda Saunders is a 64 y.o. female who presents for Medicare Annual (Subsequent) preventive examination.  Review of Systems:  Home Safety/Smoke Alarms: Feels safe in home. Smoke alarms in place.  Lives in 1 story home. Has custody on 60yo granddaughter.  Female:   Pap-  08/04/19. Mammo- 11/07/19           CCS- 09/04/11.    Objective:     Vitals:Unable to assess. This visit is enabled though telemedicine due to Covid 19.   Advanced Directives 12/20/2019 11/24/2019 07/25/2019 03/21/2019 03/11/2019 12/17/2018 11/15/2018  Does Patient Have a Medical Advance Directive? No No No No No No No  Does patient want to make changes to medical advance directive? - - - - - No - Patient declined -  Would patient like information on creating a medical advance directive? No - Patient declined - - No - Patient declined - - No - Patient declined    Tobacco Social History   Tobacco Use  Smoking Status Former Smoker  . Quit date: 07/21/2004  . Years since quitting: 15.4  Smokeless Tobacco Never  Used     Counseling given: Not Answered   Clinical Intake: Pain : No/denies pain     Past Medical History:  Diagnosis Date  . Cardiomyopathy ([facility])   . Hypertension   . Lupus ([place])   . PUD (peptic ulcer disease)   . Stroke ([place]) 08/13/2018  . Vertigo    Past Surgical History:  Procedure Laterality Date  . BACK SURGERY    . CARPAL TUNNEL RELEASE    . NECK SURGERY     2015   Family History  Problem Relation Age of Onset  . Hypertension Mother   . Arthritis Mother   . Heart failure Mother   . Stroke Mother   . Kidney disease Father   . Hypertension Father    Social History   Socioeconomic History  . Marital status: Single    Spouse name: Not on file  . Number of children: 1  . Years of education: 35  . Highest education level: Not on file  Occupational History  . Occupation: retired    Comment: retired  Tobacco Use  . Smoking status: Former Smoker    Quit date: 07/21/2004    Years since quitting: 15.4  . Smokeless tobacco: Never Used  Substance and Sexual Activity  . Alcohol use: Yes    Comment: occasional  . Drug use: No  . Sexual activity: Not on file  Other Topics Concern  . Not on file  Social History Narrative   Lives one level with son/grandchildren; Left handed; high school grad;  retired; walks for exercise; no caffeine   Social Determinants of Radio broadcast assistant Strain: Low Risk   . Difficulty of Paying Living Expenses: Not very hard  Food Insecurity: No Food Insecurity  . Worried About Charity fundraiser in the Last Year: Never true  . Ran Out of Food in the Last Year: Never true  Transportation Needs: No Transportation Needs  . Lack of Transportation (Medical): No  . Lack of Transportation (Non-Medical): No  Physical Activity:   . Days of Exercise per Week:   . Minutes of Exercise per Session:   Stress:   . Feeling of Stress :   Social Connections:   . Frequency of Communication with Friends and Family:   . Frequency of  Social Gatherings with Friends and Family:   . Attends Religious Services:   . Active Member of Clubs or Organizations:   . Attends Archivist Meetings:   Marland Kitchen Marital Status:     Outpatient Encounter Medications as of 12/20/2019  Medication Sig  . acetaminophen (TYLENOL) 325 MG tablet Take 2 tablets (650 mg total) by mouth every 4 (four) hours as needed for mild pain (or temp > 37.5 C (99.5 F)).  Marland Kitchen apixaban (ELIQUIS) 5 MG TABS tablet Take 1 tablet (5 mg total) by mouth 2 (two) times daily.  Marland Kitchen aspirin EC 81 MG tablet Take 81 mg by mouth daily.  Marland Kitchen atorvastatin (LIPITOR) 20 MG tablet Take 1 tablet (20 mg total) by mouth daily.  . folic acid (FOLVITE) 1 MG tablet Take 1 mg by mouth daily.  Marland Kitchen gabapentin (NEURONTIN) 300 MG capsule Take 1 capsule (300 mg total) by mouth 2 (two) times daily.  . hydrochlorothiazide (HYDRODIURIL) 25 MG tablet Take 1 tablet (25 mg total) by mouth every other day.  . hydroxychloroquine (PLAQUENIL) 200 MG tablet Take 200 mg by mouth 2 (two) times daily.  Marland Kitchen losartan (COZAAR) 50 MG tablet Take 1 tablet (50 mg total) by mouth daily.  . meclizine (ANTIVERT) 25 MG tablet TAKE 1 TABLET BY MOUTH TWICE DAILY AS NEEDED FOR DIZZINESS  . metoprolol succinate (TOPROL-XL) 25 MG 24 hr tablet Take 1 tablet (25 mg total) by mouth daily.  . naproxen sodium (ANAPROX) 550 MG tablet TAKE 1 TABLET BY MOUTH TWICE DAILY WITH MEALS AS NEEDED FOR PAIN- use sparingly  . ORENCIA CLICKJECT 0000000 MG/ML SOAJ Inject 1 Syringe into the skin once a week.  . [DISCONTINUED] gabapentin (NEURONTIN) 100 MG capsule Take 1 capsule twice daily for a week, then 2 capsules twice daily for a week, then 3 capsules twice daily.  Take in addition to 300mg  capsule twice daily.  Contact office for refill.   No facility-administered encounter medications on file as of 12/20/2019.    Activities of Daily Living In your present state of health, do you have any difficulty performing the following activities: 12/20/2019    Hearing? N  Vision? N  Difficulty concentrating or making decisions? N  Walking or climbing stairs? N  Dressing or bathing? N  Doing errands, shopping? N  Preparing Food and eating ? N  Using the Toilet? N  In the past six months, have you accidently leaked urine? N  Do you have problems with loss of bowel control? N  Managing your Medications? N  Managing your Finances? N  Housekeeping or managing your Housekeeping? N  Some recent data might be hidden    Patient Care Team: Copland, Gay Filler, MD as PCP - General (Family Medicine)  Lelon Perla, MD as PCP - Cardiology (Cardiology) Pieter Partridge, DO as Consulting Physician (Neurology) Day, Melvenia Beam, [facility] as Pharmacist (Pharmacist) Rosita Kea, PA-C (Rheumatology)    Assessment:   This is a routine wellness examination for Kamar. Physical assessment deferred to PCP.  Exercise Activities and Dietary recommendations Current Exercise Habits: The patient does not participate in regular exercise at present, Exercise limited by: None identified Diet (meal preparation, eat out, water intake, caffeinated beverages, dairy products, fruits and vegetables): well balanced   Goals    . A1c goal less than 6.5%     -Pre-Diabetes a1c range is 5.7% to 6.4%. Your most recent a1c was 6.1% on 08/04/2019.  -Usually the full diabetes diagnosis is given when a1c reaches 6.5% or higher.     . Blood pressure goal less than 140/90    . Increase physical activity     Walk 20 min 3x/week, (use 5 min rule)    . LDL goal less than 70    . Pharmacy Care Plan     CARE PLAN ENTRY  Current Barriers:  . Chronic Disease Management support, education, and care coordination needs related to PreDM, HTN, Hx of CVA, Noncompaction Cardiomyopathy, Lupus, Gout, Pain   Hypertension . Pharmacist Clinical Goal(s): o Over the next 180 days, patient will work with PharmD and providers to maintain BP goal <140/90 . Current regimen:  o Losartan 50mg  daily, hctz  25mg  every other day, metoprolol succinate 50mg  daily . Interventions: o Coordinating patient placement in adherence packaging to assist with adherence  . Patient self care activities - Over the next 180 days, patient will: o Check BP twice a week, document, and provide at future appointments o Ensure daily salt intake < 2300 mg/day  Hyperlipidemia . Pharmacist Clinical Goal(s): o Over the next 180 days, patient will work with PharmD and providers to achieve LDL goal <70 . Current regimen:  o Atorvastatin 20mg  daily . Interventions: o Collaboration with provider regarding medication management (atorvastatin increase to 20mg  daily) . Patient self care activities - Over the next 180 days, patient will: o Maintain cholesterol medication regimen.   Pre-Diabetes . Pharmacist Clinical Goal(s): o Over the next 180 days, patient will work with PharmD and providers to maintain A1c goal <6.5% . Current regimen:  o Diet and exercise management   . Patient self care activities - Over the next 180 days, patient will: o Continue management with diet and exercise  Health Maintenance  . Pharmacist Clinical Goal(s) o Over the next 180 days, patient will work with PharmD and providers to Remain up to date on health maintenance  . Interventions: o Recommended patient complete Shingrix vaccine series . Patient self care activities - Over the next 180 days, patient will: o Complete COVID Vaccine series o Complete Shingrix vaccine series if agreeable  Medication management . Pharmacist Clinical Goal(s): o Over the next 90 days, patient will work with PharmD and providers to achieve optimal medication adherence . Current pharmacy: [facility] . Interventions o Comprehensive medication review performed. o Utilize [facility] pharmacy for medication synchronization, packaging and delivery . Patient self care activities - Over the next 90 days, patient will: o Focus on medication adherence by filling  medications appropriately  o Take medications as prescribed o Report any questions or concerns to PharmD and/or provider(s)  Please see past updates related to this goal by clicking on the "Past Updates" button in the selected goal      . Take Eliquis 5mg  twice  daily instead of 2 tablets once daily       Fall Risk Fall Risk  12/20/2019 11/24/2019 07/25/2019 03/21/2019 12/17/2018  Falls in the past year? 0 1 0 0 0  Number falls in past yr: 0 0 0 - -  Injury with Fall? 0 0 0 - -  Follow up Education provided;Falls prevention discussed - - - -   Depression Screen PHQ 2/9 Scores 12/20/2019 12/17/2018 06/18/2018 10/22/2016  PHQ - 2 Score 0 0 0 0     Cognitive Function Ad8 score reviewed for issues:  Issues making decisions:no  Less interest in hobbies / activities:no  Repeats questions, stories (family complaining):no  Trouble using ordinary gadgets (microwave, computer, phone):no  Forgets the month or year: no  Mismanaging finances: no  Remembering appts:no  Daily problems with thinking and/or memory:no Ad8 score is=0    MMSE - Mini Mental State Exam 05/14/2016  Orientation to time 5  Orientation to Place 5  Registration 3  Attention/ Calculation 4  Recall 3  Language- name 2 objects 2  Language- repeat 1  Language- follow 3 step command 3  Language- read & follow direction 1  Write a sentence 1  Copy design 1  Total score 29        Immunization History  Administered Date(s) Administered  . Moderna SARS-COVID-2 Vaccination 11/17/2019  . Tdap 03/12/2016    Screening Tests Health Maintenance  Topic Date Due  . COVID-19 Vaccine (2 - Moderna 2-dose series) 12/15/2019  . INFLUENZA VACCINE  02/19/2020  . COLONOSCOPY  09/03/2021  . MAMMOGRAM  11/06/2021  . PAP SMEAR-Modifier  08/03/2022  . TETANUS/TDAP  03/12/2026  . Hepatitis C Screening  Completed  . HIV Screening  Completed      Plan:    Please schedule your next medicare wellness visit with me in 1  yr.  Continue to eat heart healthy diet (full of fruits, vegetables, whole grains, lean protein, water--limit salt, fat, and sugar intake) and increase physical activity as tolerated.  Continue doing brain stimulating activities (puzzles, reading, adult coloring books, staying active) to keep memory sharp.   Bring a copy of your living will and/or healthcare power of attorney to your next office visit.   I have personally reviewed and noted the following in the patient's chart:   . Medical and social history . Use of alcohol, tobacco or illicit drugs  . Current medications and supplements . Functional ability and status . Nutritional status . Physical activity . Advanced directives . List of other physicians . Hospitalizations, surgeries, and ER visits in previous 12 months . Vitals . Screenings to include cognitive, depression, and falls . Referrals and appointments  In addition, I have reviewed and discussed with patient certain preventive protocols, quality metrics, and best practice recommendations. A written personalized care plan for preventive services as well as general preventive health recommendations were provided to patient.     Shela Nevin, [place]  12/20/2019

## 2019-12-20 ENCOUNTER — Ambulatory Visit (INDEPENDENT_AMBULATORY_CARE_PROVIDER_SITE_OTHER): Payer: Medicare Other | Admitting: *Deleted

## 2019-12-20 ENCOUNTER — Encounter: Payer: Self-pay | Admitting: *Deleted

## 2019-12-20 ENCOUNTER — Other Ambulatory Visit: Payer: Self-pay

## 2019-12-20 DIAGNOSIS — Z0000 Encounter for general adult medical examination without abnormal findings: Secondary | ICD-10-CM | POA: Diagnosis not present

## 2019-12-20 NOTE — Patient Instructions (Signed)
Please schedule your next medicare wellness visit with me in 1 yr.  Continue to eat heart healthy diet (full of fruits, vegetables, whole grains, lean protein, water--limit salt, fat, and sugar intake) and increase physical activity as tolerated.  Continue doing brain stimulating activities (puzzles, reading, adult coloring books, staying active) to keep memory sharp.   Bring a copy of your living will and/or healthcare power of attorney to your next office visit.   Amanda Saunders , Thank you for taking time to come for your Medicare Wellness Visit. I appreciate your ongoing commitment to your health goals. Please review the following plan we discussed and let me know if I can assist you in the future.   These are the goals we discussed: Goals    . A1c goal less than 6.5%     -Pre-Diabetes a1c range is 5.7% to 6.4%. Your most recent a1c was 6.1% on 08/04/2019.  -Usually the full diabetes diagnosis is given when a1c reaches 6.5% or higher.     . Blood pressure goal less than 140/90    . Increase physical activity     Walk 20 min 3x/week, (use 5 min rule)    . LDL goal less than 70    . Pharmacy Care Plan     CARE PLAN ENTRY  Current Barriers:  . Chronic Disease Management support, education, and care coordination needs related to PreDM, HTN, Hx of CVA, Noncompaction Cardiomyopathy, Lupus, Gout, Pain   Hypertension . Pharmacist Clinical Goal(s): o Over the next 180 days, patient will work with PharmD and providers to maintain BP goal <140/90 . Current regimen:  o Losartan 33m daily, hctz 270mevery other day, metoprolol succinate 5030maily . Interventions: o Coordinating patient placement in adherence packaging to assist with adherence  . Patient self care activities - Over the next 180 days, patient will: o Check BP twice a week, document, and provide at future appointments o Ensure daily salt intake < 2300 mg/day  Hyperlipidemia . Pharmacist Clinical Goal(s): o Over the  next 180 days, patient will work with PharmD and providers to achieve LDL goal <70 . Current regimen:  o Atorvastatin 19m33mily . Interventions: o Collaboration with provider regarding medication management (atorvastatin increase to 19mg38mly) . Patient self care activities - Over the next 180 days, patient will: o Maintain cholesterol medication regimen.   Pre-Diabetes . Pharmacist Clinical Goal(s): o Over the next 180 days, patient will work with PharmD and providers to maintain A1c goal <6.5% . Current regimen:  o Diet and exercise management   . Patient self care activities - Over the next 180 days, patient will: o Continue management with diet and exercise  Health Maintenance  . Pharmacist Clinical Goal(s) o Over the next 180 days, patient will work with PharmD and providers to Remain up to date on health maintenance  . Interventions: o Recommended patient complete Shingrix vaccine series . Patient self care activities - Over the next 180 days, patient will: o Complete COVID Vaccine series o Complete Shingrix vaccine series if agreeable  Medication management . Pharmacist Clinical Goal(s): o Over the next 90 days, patient will work with PharmD and providers to achieve optimal medication adherence . Current pharmacy: [facility] . Interventions o Comprehensive medication review performed. o Utilize [facility] pharmacy for medication synchronization, packaging and delivery . Patient self care activities - Over the next 90 days, patient will: o Focus on medication adherence by filling medications appropriately  o Take medications as prescribed o Report  any questions or concerns to PharmD and/or provider(s)  Please see past updates related to this goal by clicking on the "Past Updates" button in the selected goal      . Take Eliquis 69m twice daily instead of 2 tablets once daily       This is a list of the screening recommended for you and due dates:  Health Maintenance    Topic Date Due  . COVID-19 Vaccine (2 - Moderna 2-dose series) 12/15/2019  . Flu Shot  02/19/2020  . Colon Cancer Screening  09/03/2021  . Mammogram  11/06/2021  . Pap Smear  08/03/2022  . Tetanus Vaccine  03/12/2026  .  Hepatitis C: One time screening is recommended by Center for Disease Control  (CDC) for  adults born from [birth year]through 1964.   Completed  . HIV Screening  Completed    Preventive Care [age]Years Old, Female Preventive care refers to visits with your health care provider and lifestyle choices that can promote health and wellness. This includes:  A yearly physical exam. This may also be called an annual well check.  Regular dental visits and eye exams.  Immunizations.  Screening for certain conditions.  Healthy lifestyle choices, such as eating a healthy diet, getting regular exercise, not using drugs or products that contain nicotine and tobacco, and limiting alcohol use. What can I expect for my preventive care visit? Physical exam Your health care provider will check your:  Height and weight. This may be used to calculate body mass index (BMI), which tells if you are at a healthy weight.  Heart rate and blood pressure.  Skin for abnormal spots. Counseling Your health care provider may ask you questions about your:  Alcohol, tobacco, and drug use.  Emotional well-being.  Home and relationship well-being.  Sexual activity.  Eating habits.  Work and work eStatistician  Method of birth control.  Menstrual cycle.  Pregnancy history. What immunizations do I need?  Influenza (flu) vaccine  This is recommended every year. Tetanus, diphtheria, and pertussis (Tdap) vaccine  You may need a Td booster every 10 years. Varicella (chickenpox) vaccine  You may need this if you have not been vaccinated. Zoster (shingles) vaccine  You may need this after age 63 Measles, mumps, and rubella (MMR) vaccine  You may need at least one dose of MMR if  you were born in 1957 or later. You may also need a second dose. Pneumococcal conjugate (PCV13) vaccine  You may need this if you have certain conditions and were not previously vaccinated. Pneumococcal polysaccharide (PPSV23) vaccine  You may need one or two doses if you smoke cigarettes or if you have certain conditions. Meningococcal conjugate (MenACWY) vaccine  You may need this if you have certain conditions. Hepatitis A vaccine  You may need this if you have certain conditions or if you travel or work in places where you may be exposed to hepatitis A. Hepatitis B vaccine  You may need this if you have certain conditions or if you travel or work in places where you may be exposed to hepatitis B. Haemophilus influenzae type b (Hib) vaccine  You may need this if you have certain conditions. Human papillomavirus (HPV) vaccine  If recommended by your health care provider, you may need three doses over 6 months. You may receive vaccines as individual doses or as more than one vaccine together in one shot (combination vaccines). Talk with your health care provider about the risks and benefits of combination  vaccines. What tests do I need? Blood tests  Lipid and cholesterol levels. These may be checked every 5 years, or more frequently if you are over 22 years old.  Hepatitis C test.  Hepatitis B test. Screening  Lung cancer screening. You may have this screening every year starting at age 54 if you have a 30-pack-year history of smoking and currently smoke or have quit within the past 15 years.  Colorectal cancer screening. All adults should have this screening starting at age 63 and continuing until age 67. Your health care provider may recommend screening at age 61 if you are at increased risk. You will have tests every 1-10 years, depending on your results and the type of screening test.  Diabetes screening. This is done by checking your blood sugar (glucose) after you have not  eaten for a while (fasting). You may have this done every 1-3 years.  Mammogram. This may be done every 1-2 years. Talk with your health care provider about when you should start having regular mammograms. This may depend on whether you have a family history of breast cancer.  BRCA-related cancer screening. This may be done if you have a family history of breast, ovarian, tubal, or peritoneal cancers.  Pelvic exam and Pap test. This may be done every 3 years starting at age 67. Starting at age 65, this may be done every 5 years if you have a Pap test in combination with an HPV test. Other tests  Sexually transmitted disease (STD) testing.  Bone density scan. This is done to screen for osteoporosis. You may have this scan if you are at high risk for osteoporosis. Follow these instructions at home: Eating and drinking  Eat a diet that includes fresh fruits and vegetables, whole grains, lean protein, and low-fat dairy.  Take vitamin and mineral supplements as recommended by your health care provider.  Do not drink alcohol if: ? Your health care provider tells you not to drink. ? You are pregnant, may be pregnant, or are planning to become pregnant.  If you drink alcohol: ? Limit how much you have to 0-1 drink a day. ? Be aware of how much alcohol is in your drink. In the [place] one drink equals one 12 oz bottle of beer (355 mL), one 5 oz glass of wine (148 mL), or one 1 oz glass of hard liquor (44 mL). Lifestyle  Take daily care of your teeth and gums.  Stay active. Exercise for at least 30 minutes on 5 or more days each week.  Do not use any products that contain nicotine or tobacco, such as cigarettes, e-cigarettes, and chewing tobacco. If you need help quitting, ask your health care provider.  If you are sexually active, practice safe sex. Use a condom or other form of birth control (contraception) in order to prevent pregnancy and STIs (sexually transmitted infections).  If told  by your health care provider, take low-dose aspirin daily starting at age 76. What's next?  Visit your health care provider once a year for a well check visit.  Ask your health care provider how often you should have your eyes and teeth checked.  Stay up to date on all vaccines. This information is not intended to replace advice given to you by your health care provider. Make sure you discuss any questions you have with your health care provider. Document Revised: 03/18/2018 Document Reviewed: 03/18/2018 Elsevier Patient Education  2020 [employer].

## 2019-12-27 ENCOUNTER — Other Ambulatory Visit: Payer: Self-pay | Admitting: Neurology

## 2019-12-27 ENCOUNTER — Telehealth: Payer: Self-pay | Admitting: Neurology

## 2019-12-27 ENCOUNTER — Ambulatory Visit: Payer: Self-pay | Admitting: Pharmacist

## 2019-12-27 MED ORDER — GABAPENTIN 300 MG PO CAPS: 300.0000 mg | ORAL_CAPSULE | Freq: Two times a day (BID) | ORAL | 5 refills | Status: DC

## 2019-12-27 NOTE — Chronic Care Management (AMB) (Signed)
Reviewed chart for medication changes ahead of medication coordination call.  OVs, Consults, or hospital visits since last care coordination call/Pharmacist visit.   12/09/19: Dr. Tomi Likens request for gabapentin 600mg  twice daily be sent to pharmacy to replace patient's current supply of gabapentin 300mg  and 100mg  capsules to total 600mg  twice daily  BP Readings from Last 3 Encounters:  11/07/19 114/76  09/13/19 117/68  08/04/19 118/70    Lab Results  Component Value Date   HGBA1C 6.1 08/04/2019     Patient obtains medications through Adherence Packaging  30 Days   Last adherence delivery included:  Metoprolol 20mg  Er daily-Breakfast Gabapentin 300mg  twice daily- Breakfast, Bedtime  Eliquis 5mg  twice daily -Breakfast, Bedtime  Hydroxychlor 200mg  twice daily -Breakfast, Bedtime  Hydrochlorothiazide 25mg  -Monday, Wednesday, Friday -Breakfast  Atorvastatin 20mg  -Bedtime  Naproxen Sodium 550mg  as needed -Vial   Meclizine 25mg twice daily as needed-Vial   Patient is due for next adherence delivery on: 12/29/19. Called patient and reviewed medications and coordinated delivery.  This delivery to include: Gabapentin 600mg  twice daily - Breakfast, Bedtime Hydroxychlor 200mg  daily-Bedtime  Hydrochlorothiazide 25mg  every other [place] Mon, Wed, Fri -Breakfast  Eliquis 5mg  twice daily- Breakfast, Bedtime  Atorvastatin 20mg  -Bedtime  Metoprolol 25mg  Er- Breakfast  Naproxen Sodium 550mg  -twice daily as needed-Vial  Meclizine 25mg  one tablet as needed -Vial  Tramadol Hcl 50mg  one tablet as needed -Vial    Per Dr. Tomi Likens 11/24/19 visit, pt was titrated up to taking #3 100mg  gabapentin capsules twice daily to her packaged gabapentin 300mg  capsules to total 600mg  twice daily.  Should be receiving script for gabapentin 600mg  twice daily to include in packaging. Called Dr. Georgie Chard office and left a VM to have this prescription sent to [facility].   Called patient and confirmed that she has enough gabapentin  300mg  and 100mg  capsules to last until Friday 12/30/19.  Patient declined filling losartan due to having 30 [place] supply remaining.  Patient needs refills for naproxen and tramadol for next month's fill. Pharmacy has enough for 30DS for this month's fill.  Confirmed delivery date of 12/29/19, advised patient that pharmacy will contact them the morning of delivery.

## 2019-12-27 NOTE — Telephone Encounter (Signed)
Done

## 2019-12-27 NOTE — Telephone Encounter (Signed)
Melvenia Beam from [facility] called in and left a voicemail. She asked for the patient's Gabapentin 600mg  twice a day prescription to be sent to [facility] in the patient's chart.

## 2020-01-01 ENCOUNTER — Encounter: Payer: Self-pay | Admitting: Family Medicine

## 2020-01-10 ENCOUNTER — Telehealth: Payer: Self-pay | Admitting: Family Medicine

## 2020-01-10 DIAGNOSIS — Z1211 Encounter for screening for malignant neoplasm of colon: Secondary | ICD-10-CM

## 2020-01-10 NOTE — Telephone Encounter (Signed)
Referral placed to GI 

## 2020-01-10 NOTE — Telephone Encounter (Signed)
Would you like for me to order a colorguard for this patient ?

## 2020-01-10 NOTE — Telephone Encounter (Signed)
Caller: Amanda Saunders Call back phone number: [phone]   Patient states is time for her to get a colonoscopy. Patient would like for you to set up an appointment.

## 2020-01-20 ENCOUNTER — Ambulatory Visit: Payer: Self-pay | Admitting: Pharmacist

## 2020-01-20 NOTE — Chronic Care Management (AMB) (Signed)
Reviewed chart for medication changes ahead of medication coordination call.  No OVs, Consults, or hospital visits since last care coordination call/Pharmacist visit.  No medication changes indicated OR if recent visit, treatment plan here.  BP Readings from Last 3 Encounters:  11/07/19 114/76  09/13/19 117/68  08/04/19 118/70    Lab Results  Component Value Date   HGBA1C 6.1 08/04/2019     Patient obtains medications through Adherence Packaging  90 Days   Last adherence delivery included:  Gabapentin 600mg  twice daily - Breakfast, Bedtime Hydroxychlor 200mg  twice daily- Breakfast, Bedtime  Hydrochlorothiazide 25mg  every other Amanda Saunders Mon, Wed, Fri -Breakfast  Eliquis 5mg  twice daily- Breakfast, Bedtime  Atorvastatin 20mg  -Bedtime  Metoprolol 25mg  Er- Breakfast  Naproxen Sodium 550mg  -twice daily as needed-Vial  Meclizine 25mg  one tablet as needed -Vial  Tramadol Hcl 50mg  one tablet as needed -Vial    Patient declined losartan last month due to having 30 Amanda Saunders supply on hand.  Patient is due for next adherence delivery on: 01/25/2020. Called patient and reviewed medications and coordinated delivery.  This delivery to include: Losartan Potassium 50mg  daily-Breakfast  Gabapentin 600mg  one twice daily - Breakfast, Bedtime Hydroxychloroquine 200mg  twice daily-Breakfast, Bedtime  Hydrochlorot 25mg  one tablet -Monday, Wednesday, and Friday-Breakfast Eliquis 5mg  twice daily-Breakfast, Bedtime  Atorvastatin 20mg  daily - Bedtime  Metoprolol Succinate 25mg  Er daily-Breakfast  Meclizine 25mg  as needed vial   Confirmed delivery date of 01/25/20, advised patient that pharmacy will contact them the morning of delivery.

## 2020-01-24 ENCOUNTER — Other Ambulatory Visit: Payer: Self-pay | Admitting: Cardiology

## 2020-01-24 MED ORDER — APIXABAN 5 MG PO TABS: 5.0000 mg | ORAL_TABLET | Freq: Two times a day (BID) | ORAL | 1 refills | Status: DC

## 2020-01-24 NOTE — Telephone Encounter (Signed)
New Message   *STAT* If patient is at the pharmacy, call can be transferred to refill team.   1. Which medications need to be refilled? (please list name of each medication and dose if known) apixaban (ELIQUIS) 5 MG TABS tablet  2. Which pharmacy/location (including street and city if local pharmacy) is medication to be sent to? [facility] - [place], [place] - [place] [address]  3. Do they need a 30 day or 90 day supply? 30 day supply with refills

## 2020-01-26 ENCOUNTER — Other Ambulatory Visit: Payer: Self-pay | Admitting: Family Medicine

## 2020-01-26 DIAGNOSIS — R42 Dizziness and giddiness: Secondary | ICD-10-CM

## 2020-02-16 ENCOUNTER — Other Ambulatory Visit: Payer: Self-pay | Admitting: Neurology

## 2020-02-19 NOTE — Progress Notes (Addendum)
[place] [facility] [address], [place], [place] [address] [phone]  Date:  02/23/2020   Name:  Amanda Saunders   DOB:  October 30, 1955   MRN:  [id number]  PCP:  Darreld Mclean, MD    Chief Complaint: Hypertension and Back Pain (left side, 2 weeks, no known injury)   History of Present Illness:  Amanda Saunders is a 64 y.o. very pleasant female patient who presents with the following:  Here today for a follow-up visit History of lupus, HTN, CVA 07/2018, pre-diabetes  Last seen by myself in April of this year   Pt of rheumatology for her SLE- Marella Chimes is her primary provider   She has noted left lower back pain for about 2 weeks NKI, somewhat insidious May radiate into her left lower quadrant She feels fine laying flat on her back- standing up may trigger pain. Walking is sometimes painful She is using naproxen and will lay down when she has a more intense pain, and this helps  The pain is present daily but varies in intensity No rash on her skin, no urinary symptoms.  She reports that Junie Panning checked her urine at yesterday's rheumatology visit to check for blood, we are still waiting on this report  covid series complete Colon UTD Most recent labs in January of this year  Lab Results  Component Value Date   HGBA1C 6.1 08/04/2019   eliquis Asa 81 lipitor Gabapentin hctz Plaquenil Losartan  orencia   She has lost a bit of weight- she is down about 10 lbs  Wt Readings from Last 3 Encounters:  02/23/20 196 lb (88.9 kg)  11/24/19 205 lb (93 kg)  11/07/19 204 lb (92.5 kg)     Patient Active Problem List   Diagnosis Date Noted  . Systemic lupus erythematosus ([place]) 08/04/2019  . Acute ischemic stroke ([place]) 08/14/2018  . HTN (hypertension) 08/14/2018  . Pre-diabetes 04/03/2016  . History of CVA (cerebrovascular accident) 03/12/2016  . Chest pain at rest 03/12/2016  . Peripheral edema 03/12/2016     Past Medical History:  Diagnosis Date  . Cardiomyopathy ([place])   . Hypertension   . Lupus ([place])   . PUD (peptic ulcer disease)   . Stroke ([place]) 08/13/2018  . Vertigo     Past Surgical History:  Procedure Laterality Date  . BACK SURGERY    . CARPAL TUNNEL RELEASE    . NECK SURGERY     2015    Social History   Tobacco Use  . Smoking status: Former Smoker    Quit date: 07/21/2004    Years since quitting: 15.6  . Smokeless tobacco: Never Used  Vaping Use  . Vaping Use: Never used  Substance Use Topics  . Alcohol use: Yes    Comment: occasional  . Drug use: No    Family History  Problem Relation Age of Onset  . Hypertension Mother   . Arthritis Mother   . Heart failure Mother   . Stroke Mother   . Kidney disease Father   . Hypertension Father     Allergies  Allergen Reactions  . Codeine Nausea And Vomiting    Medication list has been reviewed and updated.  Current Outpatient Medications on File Prior to Visit  Medication Sig Dispense Refill  . acetaminophen (TYLENOL) 325 MG tablet Take 2 tablets (650 mg total) by mouth every 4 (four) hours as needed for mild pain (or temp >  37.5 C (99.5 F)).    Marland Kitchen apixaban (ELIQUIS) 5 MG TABS tablet Take 1 tablet (5 mg total) by mouth 2 (two) times daily. 180 tablet 1  . aspirin EC 81 MG tablet Take 81 mg by mouth daily.    Marland Kitchen atorvastatin (LIPITOR) 20 MG tablet Take 1 tablet (20 mg total) by mouth daily. 90 tablet 3  . folic acid (FOLVITE) 1 MG tablet Take 1 mg by mouth daily.    Marland Kitchen gabapentin (NEURONTIN) 300 MG capsule Take 1 capsule (300 mg total) by mouth 2 (two) times daily. 60 capsule 5  . hydrochlorothiazide (HYDRODIURIL) 25 MG tablet Take 1 tablet (25 mg total) by mouth every other day. 45 tablet 3  . hydroxychloroquine (PLAQUENIL) 200 MG tablet Take 200 mg by mouth 2 (two) times daily.    Marland Kitchen losartan (COZAAR) 50 MG tablet Take 1 tablet (50 mg total) by mouth daily. 90 tablet 3  . meclizine (ANTIVERT) 25 MG tablet Take 1  tablet (25 mg total) by mouth 2 (two) times daily as needed for dizziness. 60 tablet 0  . metoprolol succinate (TOPROL-XL) 25 MG 24 hr tablet Take 1 tablet (25 mg total) by mouth daily. 90 tablet 3  . naproxen sodium (ANAPROX) 550 MG tablet TAKE 1 TABLET BY MOUTH TWICE DAILY WITH MEALS AS NEEDED FOR PAIN- use sparingly 90 tablet 0  . ORENCIA CLICKJECT 450 MG/ML SOAJ Inject 1 Syringe into the skin once a week.     No current facility-administered medications on file prior to visit.    Review of Systems:  As per HPI- otherwise negative.   Physical Examination: Vitals:   02/23/20 0923  BP: 118/68  Pulse: 62  Resp: 17  Temp: 98.2 F (36.8 C)  SpO2: 97%   Vitals:   02/23/20 0923  Weight: 196 lb (88.9 kg)  Height: 5\' 3"  (1.6 m)   Body mass index is 34.72 kg/m. Ideal Body Weight: Weight in (lb) to have BMI = 25: 140.8  GEN: no acute distress.  Overweight but is down about 10 pounds, otherwise looks well HEENT: Atraumatic, Normocephalic.  Ears and Nose: No external deformity. CV: RRR, No M/G/R. No JVD. No thrill. No extra heart sounds. PULM: CTA B, no wheezes, crackles, rhonchi. No retractions. No resp. distress. No accessory muscle use. ABD: S, NT, ND, +BS. No rebound. No HSM. EXTR: No c/c/e PSYCH: Normally interactive. Conversant.  Patient has tenderness to palpation in the left lower back, just superior to the left SI joint.  Thoracolumbar flexion extension is normal.  Bilateral lower extremity strength, sensation, DTR is normal.  Negative straight leg raise.  Her abdominal exam is basically normal, to deep palpation I am able to elicit some tenderness in the left lateral abdomen.  I believe this is muscular as opposed to true abdominal pain I also examined the skin, no rash or sign of shingles  Assessment and Plan: Acute left-sided low back pain without sciatica - Plan: DG Lumbar Spine Complete, methocarbamol (ROBAXIN) 500 MG tablet, Comprehensive metabolic panel,  CBC  Pre-diabetes - Plan: Hemoglobin A1c  Patient today with concern of left lower back pain.  This is been present for about 2 weeks, suspect it is muscular pain or perhaps SI joint pain.  She does not have any signs or symptoms consistent with a disc herniation.  Prescribed Robaxin to use as needed, will also obtain plain films of her lower back Her urine has already been checked at rheumatology, if there is any blood present  I am glad to arrange CT renal stone protocol Asked patient to let me know if not feeling better in the next few days, sooner if worse A1c, other basic labs pending as above This visit occurred during the SARS-CoV-2 public health emergency.  Safety protocols were in place, including screening questions prior to the visit, additional usage of staff PPE, and extensive cleaning of exam room while observing appropriate contact time as indicated for disinfecting solutions.    Signed Lamar Blinks, MD   Received her labs as below, message to patient A1c stable in prediabetes range   your metabolic profile looks fine.  Minimal elevation of ALT is not of concern blood counts are normal hemoglobin A1c is stable in the prediabetes range  please keep me posted about your back, otherwise we can plan to visit in 6 months Results for orders placed or performed in visit on 02/23/20  Comprehensive metabolic panel  Result Value Ref Range   Sodium 140 135 - 145 mEq/L   Potassium 4.1 3.5 - 5.1 mEq/L   Chloride 103 96 - 112 mEq/L   CO2 31 19 - 32 mEq/L   Glucose, Bld 96 70 - 99 mg/dL   BUN 15 6 - 23 mg/dL   Creatinine, Ser 0.88 0.40 - 1.20 mg/dL   Total Bilirubin 0.3 0.2 - 1.2 mg/dL   Alkaline Phosphatase 80 39 - 117 U/L   AST 32 0 - 37 U/L   ALT 42 (H) 0 - 35 U/L   Total Protein 7.1 6.0 - 8.3 g/dL   Albumin 4.3 3.5 - 5.2 g/dL   GFR 78.55 >60.00 mL/min   Calcium 9.6 8.4 - 10.5 mg/dL  CBC  Result Value Ref Range   WBC 6.1 4.0 - 10.5 K/uL   RBC 4.55 3.87 - 5.11 Mil/uL    Platelets 208.0 150 - 400 K/uL   Hemoglobin 13.2 12.0 - 15.0 g/dL   HCT 39.7 36 - 46 %   MCV 87.2 78.0 - 100.0 fl   MCHC 33.2 30.0 - 36.0 g/dL   RDW 14.3 11.5 - 15.5 %  Hemoglobin A1c  Result Value Ref Range   Hgb A1c MFr Bld 6.3 4.6 - 6.5 %

## 2020-02-19 NOTE — Patient Instructions (Addendum)
Good to see you again today! I am sorry that your back is bothering you Please go by [facility] on [address] to have films of your back at your convenience I will be in touch with your labs and will also rx robaxin- a muscle relaxer- for you to use as needed.  This may cause you to feel sleepy so do not drive after taking it  Please let me know if your back is not feeling better in the next few days- Sooner if worse.

## 2020-02-20 ENCOUNTER — Telehealth: Payer: Self-pay | Admitting: Pharmacist

## 2020-02-20 NOTE — Progress Notes (Addendum)
Chronic Care Management Pharmacy Assistant   Name: Amanda Saunders  MRN: [id number] DOB: [birth year]  Reason for Encounter: Medication Review   PCP : Darreld Mclean, MD  Allergies:   Allergies  Allergen Reactions  . Codeine Nausea And Vomiting    Medications: Outpatient Encounter Medications as of 02/20/2020  Medication Sig  . acetaminophen (TYLENOL) 325 MG tablet Take 2 tablets (650 mg total) by mouth every 4 (four) hours as needed for mild pain (or temp > 37.5 C (99.5 F)).  Marland Kitchen apixaban (ELIQUIS) 5 MG TABS tablet Take 1 tablet (5 mg total) by mouth 2 (two) times daily.  Marland Kitchen aspirin EC 81 MG tablet Take 81 mg by mouth daily.  Marland Kitchen atorvastatin (LIPITOR) 20 MG tablet Take 1 tablet (20 mg total) by mouth daily.  . folic acid (FOLVITE) 1 MG tablet Take 1 mg by mouth daily.  Marland Kitchen gabapentin (NEURONTIN) 300 MG capsule Take 1 capsule (300 mg total) by mouth 2 (two) times daily.  . hydrochlorothiazide (HYDRODIURIL) 25 MG tablet Take 1 tablet (25 mg total) by mouth every other day.  . hydroxychloroquine (PLAQUENIL) 200 MG tablet Take 200 mg by mouth 2 (two) times daily.  Marland Kitchen losartan (COZAAR) 50 MG tablet Take 1 tablet (50 mg total) by mouth daily.  . meclizine (ANTIVERT) 25 MG tablet Take 1 tablet (25 mg total) by mouth 2 (two) times daily as needed for dizziness.  . metoprolol succinate (TOPROL-XL) 25 MG 24 hr tablet Take 1 tablet (25 mg total) by mouth daily.  . naproxen sodium (ANAPROX) 550 MG tablet TAKE 1 TABLET BY MOUTH TWICE DAILY WITH MEALS AS NEEDED FOR PAIN- use sparingly  . ORENCIA CLICKJECT 235 MG/ML SOAJ Inject 1 Syringe into the skin once a week.   No facility-administered encounter medications on file as of 02/20/2020.    Current Diagnosis: Patient Active Problem List   Diagnosis Date Noted  . Systemic lupus erythematosus ([place]) 08/04/2019  . Acute ischemic stroke ([place]) 08/14/2018  . HTN (hypertension) 08/14/2018  . Pre-diabetes 04/03/2016  . History of CVA  (cerebrovascular accident) 03/12/2016  . Chest pain at rest 03/12/2016  . Peripheral edema 03/12/2016    Goals Addressed   None     Follow-Up:  Coordination of Enhanced Pharmacy Services  Reviewed chart for medication changes ahead of medication coordination call.  No OVs, Consults, or hospital visits since last Pharmacist visit.  No medication changes indicated.  BP Readings from Last 3 Encounters:  11/07/19 114/76  09/13/19 117/68  08/04/19 118/70    Lab Results  Component Value Date   HGBA1C 6.1 08/04/2019     Patient obtains medications through Adherence Packaging  90 Days   Last adherence delivery included:  Losartan Potassium 50mg  daily-Breakfast   Gabapentin 600mg  one twice daily - Breakfast, Bedtime   Hydrochlorot 25mg  one tablet -Monday, Wednesday, and Friday-Breakfast  Metoprolol Succinate 25mg  Er daily-Breakfast   Hydroxychloroquine 200mg  twice daily-Breakfast, Bedtime   Atorvastatin 20mg  daily - Bedtime   Eliquis 5mg  twice daily-Breakfast, Bedtime     Patient is due for next adherence delivery on: 04-20-2020. Called patient and reviewed medications and coordinated delivery.  This delivery to include: Losartan Potassium 50mg  daily-Breakfast   Gabapentin 600mg  daily - Bedtime  (am dosage makes her drowsy) HCTZ 25mg  one tablet -Monday, Wednesday, and Friday-Breakfast  Metoprolol Succinate 25mg  Er daily-Breakfast   Hydroxychloroquine 200mg  twice daily-Breakfast, Bedtime   Atorvastatin 20mg  daily - Bedtime   Eliquis 5mg  twice daily-Breakfast, Bedtime   Naproxen  550mg  tab; twice a day with meals as needed for pain (Need a 30 day supply in September)   Patient declined the following medications: Meclizine 25mg  as needed vial due to this being a PRN medication; not needed.  Patient needs refills for Naproxen 550mg  tab.  Confirmed delivery date of 04-20-2020 advised patient that pharmacy will contact them the morning of delivery.

## 2020-02-21 DIAGNOSIS — M7989 Other specified soft tissue disorders: Secondary | ICD-10-CM | POA: Diagnosis not present

## 2020-02-21 DIAGNOSIS — M2550 Pain in unspecified joint: Secondary | ICD-10-CM | POA: Diagnosis not present

## 2020-02-21 DIAGNOSIS — M329 Systemic lupus erythematosus, unspecified: Secondary | ICD-10-CM | POA: Diagnosis not present

## 2020-02-21 DIAGNOSIS — R109 Unspecified abdominal pain: Secondary | ICD-10-CM | POA: Diagnosis not present

## 2020-02-21 DIAGNOSIS — M0609 Rheumatoid arthritis without rheumatoid factor, multiple sites: Secondary | ICD-10-CM | POA: Diagnosis not present

## 2020-02-22 ENCOUNTER — Other Ambulatory Visit: Payer: Self-pay

## 2020-02-22 ENCOUNTER — Ambulatory Visit (HOSPITAL_COMMUNITY): Payer: Medicare Other | Attending: Cardiovascular Disease

## 2020-02-22 DIAGNOSIS — I428 Other cardiomyopathies: Secondary | ICD-10-CM | POA: Diagnosis not present

## 2020-02-22 LAB — ECHOCARDIOGRAM COMPLETE
Area-P 1/2: 3.99 cm2
S' Lateral: 2.9 cm

## 2020-02-22 MED ORDER — PERFLUTREN LIPID MICROSPHERE
1.0000 mL | INTRAVENOUS | Status: AC | PRN
  Administered 2020-02-22: 2 mL via INTRAVENOUS

## 2020-02-23 ENCOUNTER — Other Ambulatory Visit: Payer: Self-pay

## 2020-02-23 ENCOUNTER — Ambulatory Visit (INDEPENDENT_AMBULATORY_CARE_PROVIDER_SITE_OTHER): Payer: Medicare Other | Admitting: Family Medicine

## 2020-02-23 ENCOUNTER — Encounter: Payer: Self-pay | Admitting: Family Medicine

## 2020-02-23 VITALS — BP 118/68 | HR 62 | Temp 98.2°F | Resp 17 | Ht 63.0 in | Wt 196.0 lb

## 2020-02-23 DIAGNOSIS — M545 Low back pain, unspecified: Secondary | ICD-10-CM

## 2020-02-23 DIAGNOSIS — R7303 Prediabetes: Secondary | ICD-10-CM

## 2020-02-23 LAB — HEMOGLOBIN A1C: Hgb A1c MFr Bld: 6.3 % (ref 4.6–6.5)

## 2020-02-23 LAB — COMPREHENSIVE METABOLIC PANEL
ALT: 42 U/L — ABNORMAL HIGH (ref 0–35)
AST: 32 U/L (ref 0–37)
Albumin: 4.3 g/dL (ref 3.5–5.2)
Alkaline Phosphatase: 80 U/L (ref 39–117)
BUN: 15 mg/dL (ref 6–23)
CO2: 31 mEq/L (ref 19–32)
Calcium: 9.6 mg/dL (ref 8.4–10.5)
Chloride: 103 mEq/L (ref 96–112)
Creatinine, Ser: 0.88 mg/dL (ref 0.40–1.20)
GFR: 78.55 mL/min (ref >60.00)
Glucose, Bld: 96 mg/dL (ref 70–99)
Potassium: 4.1 mEq/L (ref 3.5–5.1)
Sodium: 140 mEq/L (ref 135–145)
Total Bilirubin: 0.3 mg/dL (ref 0.2–1.2)
Total Protein: 7.1 g/dL (ref 6.0–8.3)

## 2020-02-23 LAB — CBC
HCT: 39.7 % (ref 36.0–46.0)
Hemoglobin: 13.2 g/dL (ref 12.0–15.0)
MCHC: 33.2 g/dL (ref 30.0–36.0)
MCV: 87.2 fl (ref 78.0–100.0)
Platelets: 208 K/uL (ref 150.0–400.0)
RBC: 4.55 Mil/uL (ref 3.87–5.11)
RDW: 14.3 % (ref 11.5–15.5)
WBC: 6.1 K/uL (ref 4.0–10.5)

## 2020-02-23 MED ORDER — METHOCARBAMOL 500 MG PO TABS: 500.0000 mg | ORAL_TABLET | Freq: Three times a day (TID) | ORAL | 0 refills | Status: DC | PRN

## 2020-03-07 ENCOUNTER — Encounter: Payer: Self-pay | Admitting: Family Medicine

## 2020-03-07 DIAGNOSIS — Z1211 Encounter for screening for malignant neoplasm of colon: Secondary | ICD-10-CM

## 2020-03-08 ENCOUNTER — Encounter: Payer: Self-pay | Admitting: Gastroenterology

## 2020-03-09 NOTE — Progress Notes (Signed)
Virtual Visit via Video Note   This visit type was conducted due to national recommendations for restrictions regarding the COVID-19 Pandemic (e.g. social distancing) in an effort to limit this patient's exposure and mitigate transmission in our community.  Due to her co-morbid illnesses, this patient is at least at moderate risk for complications without adequate follow up.  This format is felt to be most appropriate for this patient at this time.  All issues noted in this document were discussed and addressed.  A limited physical exam was performed with this format.  Please refer to the patient's chart for her consent to telehealth for [facility].      Date:  03/14/2020   ID:  Amanda Saunders, DOB May 18, 1956, MRN [id number]  Patient Location:Home Provider Location: Home  PCP:  Darreld Mclean, MD  Cardiologist:  Dr Stanford Breed  Evaluation Performed:  Follow-Up Visit  Chief Complaint:  FU CM  History of Present Illness:    FUcardiomyopathy. Nuclear study November 2017 showed no infarct or ischemia. LV function mildly reduced. 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 Had CVA January 2020. Carotid Dopplers January 2020 showed 1 to 39% bilateral stenosis. Cardiac MRI 8/20 showed EF 44 and findings c/w noncompaction, mild LAE, mild AI and MR.  Echocardiogram repeated August 2021.  LV function reduced with ejection fraction 40 to 45%.  Since last seen,she has some dyspnea on exertion unchanged.  Occasional mild pedal edema controlled with diuretics.  Occasional brief sharp pain but no exertional chest pain.  She denies syncope.  The patient does not have symptoms concerning for COVID-19 infection (fever, chills, cough, or new shortness of breath).    Past Medical History:  Diagnosis Date  . Cardiomyopathy ([place])   . Hypertension   . Lupus ([place])   . PUD (peptic  ulcer disease)   . Stroke ([place]) 08/13/2018  . Vertigo    Past Surgical History:  Procedure Laterality Date  . BACK SURGERY    . CARPAL TUNNEL RELEASE    . NECK SURGERY     2015     Current Meds  Medication Sig  . acetaminophen (TYLENOL) 325 MG tablet Take 2 tablets (650 mg total) by mouth every 4 (four) hours as needed for mild pain (or temp > 37.5 C (99.5 F)).  Marland Kitchen apixaban (ELIQUIS) 5 MG TABS tablet Take 1 tablet (5 mg total) by mouth 2 (two) times daily.  Marland Kitchen aspirin EC 81 MG tablet Take 81 mg by mouth daily.  Marland Kitchen atorvastatin (LIPITOR) 20 MG tablet Take 1 tablet (20 mg total) by mouth daily.  . folic acid (FOLVITE) 1 MG tablet Take 1 mg by mouth daily.  Marland Kitchen gabapentin (NEURONTIN) 300 MG capsule Take 1 capsule (300 mg total) by mouth 2 (two) times daily.  . hydrochlorothiazide (HYDRODIURIL) 25 MG tablet Take 1 tablet (25 mg total) by mouth every other day.  . hydroxychloroquine (PLAQUENIL) 200 MG tablet Take 200 mg by mouth 2 (two) times daily.  Marland Kitchen losartan (COZAAR) 50 MG tablet Take 1 tablet (50 mg total) by mouth daily.  . meclizine (ANTIVERT) 25 MG tablet Take 1 tablet (25 mg total) by mouth 2 (two) times daily as needed for dizziness.  . methocarbamol (ROBAXIN) 500 MG tablet Take 1 tablet (500 mg total) by mouth every 8 (eight) hours as needed for muscle spasms.  . metoprolol succinate (TOPROL-XL) 25 MG 24 hr tablet Take 1  tablet (25 mg total) by mouth daily.  . naproxen sodium (ANAPROX) 550 MG tablet TAKE 1 TABLET BY MOUTH TWICE DAILY WITH MEALS AS NEEDED FOR PAIN- use sparingly  . ORENCIA CLICKJECT 119 MG/ML SOAJ Inject 1 Syringe into the skin once a week.     Allergies:   Codeine   Social History   Tobacco Use  . Smoking status: Former Smoker    Quit date: 07/21/2004    Years since quitting: 15.6  . Smokeless tobacco: Never Used  Vaping Use  . Vaping Use: Never used  Substance Use Topics  . Alcohol use: Yes    Comment: occasional  . Drug use: No     Family Hx: The  patient's family history includes Arthritis in her mother; Heart failure in her mother; Hypertension in her father and mother; Kidney disease in her father; Stroke in her mother.  ROS:   Please see the history of present illness.    No Fever, chills  or productive cough All other systems reviewed and are negative.   Recent Labs: 08/04/2019: TSH 2.68 02/23/2020: ALT 42; BUN 15; Creatinine, Ser 0.88; Hemoglobin 13.2; Platelets 208.0; Potassium 4.1; Sodium 140   Recent Lipid Panel Lab Results  Component Value Date/Time   CHOL 153 08/04/2019 11:31 AM   TRIG 68.0 08/04/2019 11:31 AM   HDL 47.50 08/04/2019 11:31 AM   CHOLHDL 3 08/04/2019 11:31 AM   LDLCALC 92 08/04/2019 11:31 AM    Wt Readings from Last 3 Encounters:  03/14/20 195 lb (88.5 kg)  02/23/20 196 lb (88.9 kg)  11/24/19 205 lb (93 kg)     Objective:    Vital Signs:  BP 120/78   Pulse 71   Ht 5\' 3"  (1.6 m)   Wt 195 lb (88.5 kg)   BMI 34.54 kg/m    VITAL SIGNS:  reviewed NAD Answers questions appropriately Normal affect Remainder of physical examination not performed (telehealth visit; coronavirus pandemic)  ASSESSMENT & PLAN:    1. Noncompaction-previous cardiac MRI showed noncompaction.  Most recent echocardiogram shows ejection fraction 40 to 45%.  Continue ARB and beta-blocker.  Patient had a CVA previously which could have been related to intracranial vascular disease but also to noncompaction.  We will therefore continue apixaban at present dose.  Patient does not have a history of syncope and no family history of sudden cardiac death. 2. 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  Previous nuclear study showed no ischemia.  Continue ARB and beta-blocker. 3. Hyperlipidemia-continue statin. 4. Prior CVA-continue apixaban as this may have been related to noncompaction as outlined above.  Given need for apixaban will discontinue aspirin.  COVID-19 Education: The importance of social distancing was discussed  today.  Time:   Today, I have spent 16 minutes with the patient with telehealth technology discussing the above problems.     Medication Adjustments/Labs and Tests Ordered: Current medicines are reviewed at length with the patient today.  Concerns regarding medicines are outlined above.   Tests Ordered: No orders of the defined types were placed in this encounter.   Medication Changes: No orders of the defined types were placed in this encounter.   Follow Up:  In Person in 6 month(s)  Signed, Kirk Ruths, MD  03/14/2020 8:31 AM    [place]

## 2020-03-14 ENCOUNTER — Telehealth (INDEPENDENT_AMBULATORY_CARE_PROVIDER_SITE_OTHER): Payer: Medicare Other | Admitting: Cardiology

## 2020-03-14 ENCOUNTER — Encounter: Payer: Self-pay | Admitting: Cardiology

## 2020-03-14 VITALS — BP 120/78 | HR 71 | Ht 63.0 in | Wt 195.0 lb

## 2020-03-14 DIAGNOSIS — E7800 Pure hypercholesterolemia, unspecified: Secondary | ICD-10-CM

## 2020-03-14 DIAGNOSIS — I428 Other cardiomyopathies: Secondary | ICD-10-CM

## 2020-03-14 DIAGNOSIS — I10 Essential (primary) hypertension: Secondary | ICD-10-CM

## 2020-03-14 NOTE — Patient Instructions (Signed)
Medication Instructions:   STOP ASPIRIN  *If you need a refill on your cardiac medications before your next appointment, please call your pharmacy*   Lab Work: If you have labs (blood work) drawn today and your tests are completely normal, you will receive your results only by: Marland Kitchen MyChart Message (if you have MyChart) OR . A paper copy in the mail If you have any lab test that is abnormal or we need to change your treatment, we will call you to review the results   Follow-Up: At [facility], you and your health needs are our priority.  As part of our continuing mission to provide you with exceptional heart care, we have created designated Provider Care Teams.  These Care Teams include your primary Cardiologist (physician) and Advanced Practice Providers (APPs -  Physician Assistants and Nurse Practitioners) who all work together to provide you with the care you need, when you need it.  We recommend signing up for the patient portal called "MyChart".  Sign up information is provided on this After Visit Summary.  MyChart is used to connect with patients for Virtual Visits (Telemedicine).  Patients are able to view lab/test results, encounter notes, upcoming appointments, etc.  Non-urgent messages can be sent to your provider as well.   To learn more about what you can do with MyChart, go to [email or web address].    Your next appointment:   6 month(s)  The format for your next appointment:   In Person  Provider:   You may see Kirk Ruths, MD or one of the following Advanced Practice Providers on your designated Care Team:    Kerin Ransom, PA-C  [place], [place]  Coletta Memos, [place]

## 2020-03-19 ENCOUNTER — Telehealth: Payer: Self-pay | Admitting: Pharmacist

## 2020-03-19 ENCOUNTER — Other Ambulatory Visit: Payer: Self-pay

## 2020-03-19 ENCOUNTER — Other Ambulatory Visit: Payer: Self-pay | Admitting: Family Medicine

## 2020-03-19 DIAGNOSIS — M25562 Pain in left knee: Secondary | ICD-10-CM

## 2020-03-19 NOTE — Telephone Encounter (Signed)
-----   Message from [place], [facility] sent at 03/19/2020 10:32 AM EDT ----- Regarding: Naproxen refill Amanda Saunders!   Would you be willing to send in a naproxen refill to [facility] for this patient?  Thanks, De Blanch, PharmD Clinical Pharmacist [facility] at [facility] [phone]

## 2020-03-19 NOTE — Progress Notes (Addendum)
Chronic Care Management Pharmacy Assistant   Name: Amanda Saunders  MRN: [id number] DOB: 14-Oct-1955  Reason for Encounter: Medication Review  PCP : Darreld Mclean, MD  Allergies:   Allergies  Allergen Reactions  . Codeine Nausea And Vomiting    Medications: Outpatient Encounter Medications as of 03/19/2020  Medication Sig  . acetaminophen (TYLENOL) 325 MG tablet Take 2 tablets (650 mg total) by mouth every 4 (four) hours as needed for mild pain (or temp > 37.5 C (99.5 F)).  Marland Kitchen apixaban (ELIQUIS) 5 MG TABS tablet Take 1 tablet (5 mg total) by mouth 2 (two) times daily.  Marland Kitchen atorvastatin (LIPITOR) 20 MG tablet Take 1 tablet (20 mg total) by mouth daily.  . folic acid (FOLVITE) 1 MG tablet Take 1 mg by mouth daily.  Marland Kitchen gabapentin (NEURONTIN) 300 MG capsule Take 1 capsule (300 mg total) by mouth 2 (two) times daily.  . hydrochlorothiazide (HYDRODIURIL) 25 MG tablet Take 1 tablet (25 mg total) by mouth every other day.  . hydroxychloroquine (PLAQUENIL) 200 MG tablet Take 200 mg by mouth 2 (two) times daily.  Marland Kitchen losartan (COZAAR) 50 MG tablet Take 1 tablet (50 mg total) by mouth daily.  . meclizine (ANTIVERT) 25 MG tablet Take 1 tablet (25 mg total) by mouth 2 (two) times daily as needed for dizziness.  . methocarbamol (ROBAXIN) 500 MG tablet Take 1 tablet (500 mg total) by mouth every 8 (eight) hours as needed for muscle spasms.  . metoprolol succinate (TOPROL-XL) 25 MG 24 hr tablet Take 1 tablet (25 mg total) by mouth daily.  . naproxen sodium (ANAPROX) 550 MG tablet TAKE 1 TABLET BY MOUTH TWICE DAILY WITH MEALS AS NEEDED FOR PAIN- use sparingly  . ORENCIA CLICKJECT 409 MG/ML SOAJ Inject 1 Syringe into the skin once a week.   No facility-administered encounter medications on file as of 03/19/2020.    Current Diagnosis: Patient Active Problem List   Diagnosis Date Noted  . Systemic lupus erythematosus ([place]) 08/04/2019  . Acute ischemic stroke ([place]) 08/14/2018  . HTN  (hypertension) 08/14/2018  . Pre-diabetes 04/03/2016  . History of CVA (cerebrovascular accident) 03/12/2016  . Chest pain at rest 03/12/2016  . Peripheral edema 03/12/2016    Goals Addressed   None    Reviewed chart for medication changes ahead of medication coordination call.  No hospital visits since last care coordination call.   Office Visits: 02-23-2020 (PCP) Patient presented in the office with Dr. Lorelei Pont c/o of left-sided lower back pain. Was prescribed Methocarbamol 500 prn every eight hours for pain.  Consults: 03-14-2020 (Cardiology) Patient presented via video with Dr.Crenshaw for a follow up. Patient was advised to stop Aspirin 81mg .   BP Readings from Last 3 Encounters:  03/14/20 120/78  02/23/20 118/68  11/07/19 114/76    Lab Results  Component Value Date   HGBA1C 6.3 02/23/2020     Patient obtains medications through Adherence Packaging  90 Days   Last adherence delivery included: Losartan Potassium 50mg  daily-Breakfast  Hydroxychloroquine 200mg  twice daily-Breakfast, Bedtime  HCTZ 25mg  one tablet -Monday, Wednesday, and Friday-Breakfast Eliquis 5mg  twice daily-Breakfast, Bedtime  Atorvastatin 20mg  daily - Bedtime  Metoprolol Succinate 25mg  Er daily-Breakfast  Meclizine 25mg  as needed vial  Gabapentin 600mg  one tab daily -  Bedtime (vial)  Patient declined losartan last month due to having 30 day supply on hand.  Patient is due for next adherence delivery on 04-20-2020 . Called patient and reviewed medications and coordinated delivery.  Coordinated  acute fill for Naproxen 550mg  to be delivered 03-20-2020.  Patient declined the following medications Losartan Potassium 50mg  daily-Breakfast  Hydroxychloroquine 200mg  twice daily-Breakfast, Bedtime  HCTZ 25mg  one tablet -Monday, Wednesday, and Friday-Breakfast Eliquis 5mg  twice daily-Breakfast, Bedtime  Atorvastatin 20mg  daily - Bedtime  Metoprolol Succinate 25mg  Er daily-Breakfast  Meclizine 25mg  as  needed vial  Gabapentin 600mg  one tab daily -  Bedtime (vial) due to enough on hand, patient is in packaging for these medications  Patient needs refills for Naproxen 550mg   Confirmed delivery date of 03-20-2020, advised patient that pharmacy will contact them the morning of delivery. .  Follow-Up:  Coordination of Enhanced Pharmacy [place], [place] Pharmacist Assistant [phone]  Reviewed by: De Blanch, PharmD Clinical Pharmacist [facility] at [facility] [phone]

## 2020-03-19 NOTE — Telephone Encounter (Signed)
Sent in previous encounter. If refilled in sep encounter please deny refill.

## 2020-04-02 NOTE — Progress Notes (Signed)
Virtual Visit via Video Note The purpose of this virtual visit is to provide medical care while limiting exposure to the novel coronavirus.    Consent was obtained for video visit:  Yes Answered questions that patient had about telehealth interaction:  Yes I discussed the limitations, risks, security and privacy concerns of performing an evaluation and management service by telemedicine. I also discussed with the patient that there may be a patient responsible charge related to this service. The patient expressed understanding and agreed to proceed.  Pt location: Home Physician Location: office Name of referring provider:  Cop[place], Gay Filler, MD I connected with Amanda Saunders at patients initiation/request on 04/03/2020 at 10:10 AM EDT by video enabled telemedicine application and verified that I am speaking with the correct person using two identifiers. Pt MRN:  [id number] Pt DOB:  [birth year] Video Participants:  Amanda Saunders   History of Present Illness:  Amanda Saunders is a 64year old left-handed woman with history of stroke who follows up for headache.  UPDATE: Gabapentin was increased in May to 600mg  BID, however she will not take the morning dose if she goes out because it causes drowsiness.  Headaches are not intense, but they are back to being daily again.  They are frontal now.  She takes Tylenol or naproxen daily.  She is under a lot of stress.  Current NSAIDs:ASA 81mg , naproxen 550mg  Current analgesic: Tylenol Current muscle relaxant:  Robaxin 500mg  Current antihypertensive: HCTZ, losartan, Toprol XL Current antiepileptic:gabapentin 600mg  twice daily Current antihistamine: Meclizine Other medication: Eliquis  HISTORY: Headache: On 03/03/19, she developed a sudden severe pounding right sided headache (radiating across forehead) with right facial burning. There was associated nausea, sometimes vomiting, photophobia and phonophobia.No visual  disturbance. She felt that her equilibrium was off. She went to the ED on 03/11/19 for further evaluation. CT head personally reviewed and negative for acute intracranial abnormality such as hemorrhage or infarct. CBC and BMP were unremarkable. She was given a headache cocktail of Reglan and Benadryl and discharged home. Headaches are still present but not as severe. She reported temperature of 99. They lessened on 8/24. Headache is mild to moderate, lasting a couple of hours off and on, daily. Sometimes she feels shaking in her head. Reports a little bit of neck pain.  04/17/2019 MRI w wo and MRA of head:advanced widespread chronic small vessel ischemic changes as well as occlusion of right distal M1 segment and severe stenoses of left M2 branches, A2 branches and mild irregularity of the PCA branches, but no acute intracranial abnormality. 04/13/2019 Sed Rate 11.  History of CVA: She was admitted to [facility] on [facility]/14/18 for increased left arm numbness and weakness with left sided tremor as well as headache, dizziness with nausea and vomiting. CT of head was personally reviewed and showed no acute findings. MRI of brain personally reviewed and demonstrated scattered acute watershed subcortical and small cortical infarcts in the right MCA/ACA and MCA/PCA areas. MRA of head personally reviewed showed severe stenosis or short segment occlusion of the distal right MCA M1 and proximal M2 segments as well as proximal severe stenosis or short segment occlusion of left MCA M2 segment. Carotid doppler showed no hemodynamically significant stenosis. 2D echocardiogram showed EF 45-50%. LDL was 77. Hgb A1c was 6.2. ASA 81mg  daily was switched to ASA 325mg  and Plavix 75mg  daily for 3 months with plan to subsequently continue Plavix alone. She was continued on atorvastatin 40mg  daily.   Past medication: tramadol  Past Medical History: Past Medical History:  Diagnosis Date  .  Cardiomyopathy ([place])   . Hypertension   . Lupus ([place])   . PUD (peptic ulcer disease)   . Stroke ([place]) 08/13/2018  . Vertigo     Medications: Outpatient Encounter Medications as of 04/03/2020  Medication Sig  . acetaminophen (TYLENOL) 325 MG tablet Take 2 tablets (650 mg total) by mouth every 4 (four) hours as needed for mild pain (or temp > 37.5 C (99.5 F)).  Marland Kitchen apixaban (ELIQUIS) 5 MG TABS tablet Take 1 tablet (5 mg total) by mouth 2 (two) times daily.  Marland Kitchen atorvastatin (LIPITOR) 20 MG tablet Take 1 tablet (20 mg total) by mouth daily.  . folic acid (FOLVITE) 1 MG tablet Take 1 mg by mouth daily.  Marland Kitchen gabapentin (NEURONTIN) 300 MG capsule Take 1 capsule (300 mg total) by mouth 2 (two) times daily.  . hydrochlorothiazide (HYDRODIURIL) 25 MG tablet Take 1 tablet (25 mg total) by mouth every other day.  . hydroxychloroquine (PLAQUENIL) 200 MG tablet Take 200 mg by mouth 2 (two) times daily.  Marland Kitchen losartan (COZAAR) 50 MG tablet Take 1 tablet (50 mg total) by mouth daily.  . meclizine (ANTIVERT) 25 MG tablet Take 1 tablet (25 mg total) by mouth 2 (two) times daily as needed for dizziness.  . methocarbamol (ROBAXIN) 500 MG tablet Take 1 tablet (500 mg total) by mouth every 8 (eight) hours as needed for muscle spasms.  . metoprolol succinate (TOPROL-XL) 25 MG 24 hr tablet Take 1 tablet (25 mg total) by mouth daily.  . naproxen sodium (ANAPROX) 550 MG tablet TAKE 1 TABLET BY MOUTH TWICE DAILY WITH MEALS AS NEEDED FOR PAIN- use sparingly  . ORENCIA CLICKJECT 875 MG/ML SOAJ Inject 1 Syringe into the skin once a week.   No facility-administered encounter medications on file as of 04/03/2020.    Allergies: Allergies  Allergen Reactions  . Codeine Nausea And Vomiting    Family History: Family History  Problem Relation Age of Onset  . Hypertension Mother   . Arthritis Mother   . Heart failure Mother   . Stroke Mother   . Kidney disease Father   . Hypertension Father     Social History: Social  History   Socioeconomic History  . Marital status: Single    Spouse name: Not on file  . Number of children: 1  . Years of education: 37  . Highest education level: Not on file  Occupational History  . Occupation: retired    Comment: retired  Tobacco Use  . Smoking status: Former Smoker    Quit date: 07/21/2004    Years since quitting: 15.7  . Smokeless tobacco: Never Used  Vaping Use  . Vaping Use: Never used  Substance and Sexual Activity  . Alcohol use: Yes    Comment: occasional  . Drug use: No  . Sexual activity: Not on file  Other Topics Concern  . Not on file  Social History Narrative   Lives one level with son/grandchildren; Left handed; high school grad; retired; walks for exercise; no caffeine   Social Determinants of Health   Financial Resource Strain: Low Risk   . Difficulty of Paying Living Expenses: Not very hard  Food Insecurity: No Food Insecurity  . Worried About Charity fundraiser in the Last Year: Never true  . Ran Out of Food in the Last Year: Never true  Transportation Needs: No Transportation Needs  . Lack of Transportation (Medical): No  .  Lack of Transportation (Non-Medical): No  Physical Activity:   . Days of Exercise per Week: Not on file  . Minutes of Exercise per Session: Not on file  Stress:   . Feeling of Stress : Not on file  Social Connections:   . Frequency of Communication with Friends and Family: Not on file  . Frequency of Social Gatherings with Friends and Family: Not on file  . Attends Religious Services: Not on file  . Active Member of Clubs or Organizations: Not on file  . Attends Archivist Meetings: Not on file  . Marital Status: Not on file  Intimate Partner Violence:   . Fear of Current or Ex-Partner: Not on file  . Emotionally Abused: Not on file  . Physically Abused: Not on file  . Sexually Abused: Not on file    Observations/Objective:   Height 5\' 3"  (1.6 m), weight 198 lb (89.8 kg). No acute distress.   Alert and oriented.  Speech fluent and not dysarthric.  Language intact.  Assessment and Plan:   1.  She now has medication-overuse headache. 2.  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  Severe intracranial stenosis 4.  HTN   1.  Continue gabapentin 600mg  twice daily. 2.  She was instructed to limit use of pain relievers to no more than 2 days out of week.  I told her that headaches will get worse before they improve.  She will contact us in 2 weeks with update. 3.  Secondary stroke prevention as per PCP 4.  Follow up in 4-6 months.  Follow Up Instructions:    -I discussed the assessment and treatment plan with the patient. The patient was provided an opportunity to ask questions and all were answered. The patient agreed with the plan and demonstrated an understanding of the instructions.   The patient was advised to call back or seek an in-person evaluation if the symptoms worsen or if the condition fails to improve as anticipated.

## 2020-04-03 ENCOUNTER — Encounter: Payer: Self-pay | Admitting: Neurology

## 2020-04-03 ENCOUNTER — Telehealth (INDEPENDENT_AMBULATORY_CARE_PROVIDER_SITE_OTHER): Payer: Medicare Other | Admitting: Neurology

## 2020-04-03 ENCOUNTER — Other Ambulatory Visit: Payer: Self-pay

## 2020-04-03 VITALS — Ht 63.0 in | Wt 198.0 lb

## 2020-04-03 DIAGNOSIS — I6529 Occlusion and stenosis of unspecified carotid artery: Secondary | ICD-10-CM

## 2020-04-03 DIAGNOSIS — G4440 Drug-induced headache, not elsewhere classified, not intractable: Secondary | ICD-10-CM

## 2020-04-03 DIAGNOSIS — I10 Essential (primary) hypertension: Secondary | ICD-10-CM

## 2020-04-03 DIAGNOSIS — Z8673 Personal history of transient ischemic attack (TIA), and cerebral infarction without residual deficits: Secondary | ICD-10-CM | POA: Diagnosis not present

## 2020-04-04 ENCOUNTER — Telehealth: Payer: Self-pay | Admitting: Pharmacist

## 2020-04-04 NOTE — Progress Notes (Addendum)
Chronic Care Management Pharmacy Assistant   Name: Amanda Saunders  MRN: [id number] DOB: 1956/02/19  Reason for Encounter: Returning Patient Call   PCP : Darreld Mclean, MD  Allergies:   Allergies  Allergen Reactions  . Codeine Nausea And Vomiting    Medications: Outpatient Encounter Medications as of 04/04/2020  Medication Sig  . acetaminophen (TYLENOL) 325 MG tablet Take 2 tablets (650 mg total) by mouth every 4 (four) hours as needed for mild pain (or temp > 37.5 C (99.5 F)).  Marland Kitchen apixaban (ELIQUIS) 5 MG TABS tablet Take 1 tablet (5 mg total) by mouth 2 (two) times daily.  Marland Kitchen atorvastatin (LIPITOR) 20 MG tablet Take 1 tablet (20 mg total) by mouth daily.  . folic acid (FOLVITE) 1 MG tablet Take 1 mg by mouth daily.  Marland Kitchen gabapentin (NEURONTIN) 300 MG capsule Take 1 capsule (300 mg total) by mouth 2 (two) times daily.  . hydrochlorothiazide (HYDRODIURIL) 25 MG tablet Take 1 tablet (25 mg total) by mouth every other day.  . hydroxychloroquine (PLAQUENIL) 200 MG tablet Take 200 mg by mouth 2 (two) times daily.  Marland Kitchen losartan (COZAAR) 50 MG tablet Take 1 tablet (50 mg total) by mouth daily.  . meclizine (ANTIVERT) 25 MG tablet Take 1 tablet (25 mg total) by mouth 2 (two) times daily as needed for dizziness.  . methocarbamol (ROBAXIN) 500 MG tablet Take 1 tablet (500 mg total) by mouth every 8 (eight) hours as needed for muscle spasms.  . metoprolol succinate (TOPROL-XL) 25 MG 24 hr tablet Take 1 tablet (25 mg total) by mouth daily.  . naproxen sodium (ANAPROX) 550 MG tablet TAKE 1 TABLET BY MOUTH TWICE DAILY WITH MEALS AS NEEDED FOR PAIN- use sparingly  . ORENCIA CLICKJECT 488 MG/ML SOAJ Inject 1 Syringe into the skin once a week.   No facility-administered encounter medications on file as of 04/04/2020.    Current Diagnosis: Patient Active Problem List   Diagnosis Date Noted  . Systemic lupus erythematosus ([place]) 08/04/2019  . Acute ischemic stroke ([place]) 08/14/2018  . HTN  (hypertension) 08/14/2018  . Pre-diabetes 04/03/2016  . History of CVA (cerebrovascular accident) 03/12/2016  . Chest pain at rest 03/12/2016  . Peripheral edema 03/12/2016    Goals Addressed   None    Reviewed chart for medication changes ahead of medication coordination call.  Consults: 04-03-2020 (Neurology) Patient presented via video visit with Dr. Tomi Likens as a follow up for headaches. Patient has a hx of stroke. Her Gabapentin was increased to 600 mg BID in May, however, she has not been taking the medication as directed due to drowsiness. She has been taking Tylenol or Naproxen daily for pain. Patient reported having headaches daily, but they are not as intense. Patient was advised to continue Gabapentin 600 mg BID and instructed to limit pain relievers to no more than two days out of the week.  No OVs or hospital visits since last care coordination call.  Medication changes: Patient is to resume taking Gabapentin twice a day.  BP Readings from Last 3 Encounters:  03/14/20 120/78  02/23/20 118/68  11/07/19 114/76    Lab Results  Component Value Date   HGBA1C 6.3 02/23/2020     Patient obtains medications through Adherence Packaging  90 Days   Last adherence delivery included:  Losartan Potassium 50mg  daily-Breakfast  Hydroxychloroquine 200mg  twice daily-Breakfast, Bedtime  HCTZ 25mg  one tablet -Monday, Wednesday, and Friday-Breakfast Eliquis 5mg  twice daily-Breakfast, Bedtime  Atorvastatin 20mg  daily -Bedtime  Metoprolol Succinate 25mg  Er daily-Breakfast  Meclizine 25mg  as needed vial Gabapentin 600mg  one tab daily - Bedtime (vial)   Patient is due for next adherence delivery on: 04-20-2020. Called patient and reviewed medications and coordinated delivery.  This delivery to include an acute fill for 16 tabs of Gabapentin 600 mg to be delivered on 04-05-2020.  Confirmed delivery date of 04-05-2020, advised patient that pharmacy will contact them the morning of  delivery.  Follow-Up:  Coordination of Enhanced Pharmacy Services   Fanny Skates, [place] Pharmacist Assistant [phone]  Reviewed by: De Blanch, PharmD Clinical Pharmacist [facility] [facility] at [facility] [phone]

## 2020-04-10 ENCOUNTER — Other Ambulatory Visit: Payer: Self-pay | Admitting: Neurology

## 2020-04-16 ENCOUNTER — Telehealth: Payer: Self-pay | Admitting: Pharmacist

## 2020-04-16 NOTE — Progress Notes (Addendum)
Chronic Care Management Pharmacy Assistant   Name: Amanda Saunders  MRN: [id number] DOB: 10/10/1955  Reason for Encounter: Medication Review   PCP : Darreld Mclean, MD  Allergies:   Allergies  Allergen Reactions  . Codeine Nausea And Vomiting    Medications: Outpatient Encounter Medications as of 04/16/2020  Medication Sig  . acetaminophen (TYLENOL) 325 MG tablet Take 2 tablets (650 mg total) by mouth every 4 (four) hours as needed for mild pain (or temp > 37.5 C (99.5 F)).  Marland Kitchen apixaban (ELIQUIS) 5 MG TABS tablet Take 1 tablet (5 mg total) by mouth 2 (two) times daily.  Marland Kitchen atorvastatin (LIPITOR) 20 MG tablet Take 1 tablet (20 mg total) by mouth daily.  . folic acid (FOLVITE) 1 MG tablet Take 1 mg by mouth daily.  Marland Kitchen gabapentin (NEURONTIN) 600 MG tablet TAKE ONE TABLET BY MOUTH EVERY MORNING and TAKE ONE TABLET BY MOUTH EVERYDAY AT BEDTIME  . hydrochlorothiazide (HYDRODIURIL) 25 MG tablet Take 1 tablet (25 mg total) by mouth every other day.  . hydroxychloroquine (PLAQUENIL) 200 MG tablet Take 200 mg by mouth 2 (two) times daily.  Marland Kitchen losartan (COZAAR) 50 MG tablet Take 1 tablet (50 mg total) by mouth daily.  . meclizine (ANTIVERT) 25 MG tablet Take 1 tablet (25 mg total) by mouth 2 (two) times daily as needed for dizziness.  . methocarbamol (ROBAXIN) 500 MG tablet Take 1 tablet (500 mg total) by mouth every 8 (eight) hours as needed for muscle spasms.  . metoprolol succinate (TOPROL-XL) 25 MG 24 hr tablet Take 1 tablet (25 mg total) by mouth daily.  . naproxen sodium (ANAPROX) 550 MG tablet TAKE 1 TABLET BY MOUTH TWICE DAILY WITH MEALS AS NEEDED FOR PAIN- use sparingly  . ORENCIA CLICKJECT 756 MG/ML SOAJ Inject 1 Syringe into the skin once a week.   No facility-administered encounter medications on file as of 04/16/2020.    Current Diagnosis: Patient Active Problem List   Diagnosis Date Noted  . Systemic lupus erythematosus ([place]) 08/04/2019  . Acute ischemic stroke ([place])  08/14/2018  . HTN (hypertension) 08/14/2018  . Pre-diabetes 04/03/2016  . History of CVA (cerebrovascular accident) 03/12/2016  . Chest pain at rest 03/12/2016  . Peripheral edema 03/12/2016    Goals Addressed   None    Reviewed chart for medication changes ahead of medication coordination call.  No OVs, Consults, or hospital visits since last care coordination call on 04-05-2020.  No medication changes indicated.  BP Readings from Last 3 Encounters:  03/14/20 120/78  02/23/20 118/68  11/07/19 114/76    Lab Results  Component Value Date   HGBA1C 6.3 02/23/2020    Attempted to complee Medication Review for the patient. She asked that I call her back at 5pm.  Patient obtains medications through Adherence Packaging  90 Days   Last adherence delivery included:  Losartan Potassium 50mg  daily-Breakfast  Hydroxychloroquine 200mg  twice daily-Breakfast, Bedtime  HCTZ25mg  one tablet -Monday, Wednesday, and Friday-Breakfast Eliquis 5mg  twice daily-Breakfast, Bedtime  Atorvastatin 20mg  daily -Bedtime  Metoprolol Succinate 25mg  Er daily-Breakfast  Meclizine 25mg  as needed vial Gabapentin 600mg  onetabdaily -Bedtime (vial)   Patient is due for next adherence delivery on: 04-20-2020. Called patient and reviewed medications and coordinated delivery.  This delivery to include: Losartan Potassium 50 mg daily-Breakfast  Hydroxychloroquine 200 mg twice daily-Breakfast, Bedtime  HCTZ25 mg one tablet -Monday, Wednesday, and Friday-Breakfast Eliquis 5 mg twice daily-Breakfast, Bedtime  Atorvastatin 20 mg daily -Bedtime  Metoprolol Succinate 25  mg ER daily-Breakfast  Gabapentin 600 mg onetabtwice a day- Breakfast,Bedtime  Meclizine 25 mg as needed vial   Patient does not need refills at this time.  Confirmed delivery date of 04-20-2020, advised patient that pharmacy will contact them the morning of delivery.  Follow-Up:  Coordination of Enhanced Pharmacy Services   Fanny Skates, [place] Pharmacist Assistant [phone]  Reviewed by: De Blanch, PharmD Clinical Pharmacist [place] [facility] at [facility] [phone]

## 2020-05-08 ENCOUNTER — Ambulatory Visit: Payer: Medicare Other | Admitting: Gastroenterology

## 2020-05-09 ENCOUNTER — Other Ambulatory Visit: Payer: Self-pay | Admitting: Family Medicine

## 2020-05-09 ENCOUNTER — Encounter: Payer: Self-pay | Admitting: Family Medicine

## 2020-05-09 DIAGNOSIS — R42 Dizziness and giddiness: Secondary | ICD-10-CM

## 2020-05-14 ENCOUNTER — Telehealth: Payer: Self-pay | Admitting: Pharmacist

## 2020-05-14 NOTE — Progress Notes (Addendum)
Chronic Care Management Pharmacy Assistant   Name: Amanda Saunders  MRN: [id number] DOB: Dec 01, 1955  Reason for Encounter: Medication Review  Patient Questions:  1.  Have you seen any other providers since your last visit? No  2.  Any changes in your medicines or health? No   PCP : Copland, Gay Filler, MD  Allergies:   Allergies  Allergen Reactions   Codeine Nausea And Vomiting    Medications: Outpatient Encounter Medications as of 05/14/2020  Medication Sig   acetaminophen (TYLENOL) 325 MG tablet Take 2 tablets (650 mg total) by mouth every 4 (four) hours as needed for mild pain (or temp > 37.5 C (99.5 F)).   apixaban (ELIQUIS) 5 MG TABS tablet Take 1 tablet (5 mg total) by mouth 2 (two) times daily.   atorvastatin (LIPITOR) 20 MG tablet Take 1 tablet (20 mg total) by mouth daily.   folic acid (FOLVITE) 1 MG tablet Take 1 mg by mouth daily.   gabapentin (NEURONTIN) 600 MG tablet TAKE ONE TABLET BY MOUTH EVERY MORNING and TAKE ONE TABLET BY MOUTH EVERYDAY AT BEDTIME   hydrochlorothiazide (HYDRODIURIL) 25 MG tablet Take 1 tablet (25 mg total) by mouth every other day.   hydroxychloroquine (PLAQUENIL) 200 MG tablet Take 200 mg by mouth 2 (two) times daily.   losartan (COZAAR) 50 MG tablet Take 1 tablet (50 mg total) by mouth daily.   meclizine (ANTIVERT) 25 MG tablet TAKE ONE TABLET BY MOUTH twice daily AS NEEDED FOR dizziness   methocarbamol (ROBAXIN) 500 MG tablet Take 1 tablet (500 mg total) by mouth every 8 (eight) hours as needed for muscle spasms.   metoprolol succinate (TOPROL-XL) 25 MG 24 hr tablet Take 1 tablet (25 mg total) by mouth daily.   naproxen sodium (ANAPROX) 550 MG tablet TAKE 1 TABLET BY MOUTH TWICE DAILY WITH MEALS AS NEEDED FOR PAIN- use sparingly   ORENCIA CLICKJECT 081 MG/ML SOAJ Inject 1 Syringe into the skin once a week.   No facility-administered encounter medications on file as of 05/14/2020.    Current Diagnosis: Patient Active Problem List     Diagnosis Date Noted   Systemic lupus erythematosus ([place]) 08/04/2019   Acute ischemic stroke ([place]) 08/14/2018   HTN (hypertension) 08/14/2018   Pre-diabetes 04/03/2016   History of CVA (cerebrovascular accident) 03/12/2016   Chest pain at rest 03/12/2016   Peripheral edema 03/12/2016    Goals Addressed   None    Reviewed chart for medication changes ahead of medication coordination call.  No OVs, Consults, or hospital visits since last care coordination call/Pharmacist visit. (If appropriate, list visit date, provider name)  No medication changes indicated OR if recent visit, treatment plan here.  BP Readings from Last 3 Encounters:  03/14/20 120/78  02/23/20 118/68  11/07/19 114/76    Lab Results  Component Value Date   HGBA1C 6.3 02/23/2020     Patient obtains medications through Adherence Packaging  90 Days   Last adherence delivery on 04-20-20 included:  Losartan Potassium 50 mg daily-Breakfast  Hydroxychloroquine 200 mg twice daily-Breakfast, Bedtime  HCTZ 25 mg one tablet -Monday, Wednesday, and Friday-Breakfast Eliquis 5 mg twice daily-Breakfast, Bedtime  Atorvastatin 20 mg daily - Bedtime  Metoprolol Succinate 25 mg ER daily-Breakfast  Gabapentin 600 mg one tab twice a day- Breakfast, Bedtime  Meclizine 25 mg as needed vial    Patient is due for next adherence delivery on: NO FILL NEEDED due to 90 day supply on 04-20-20. Called patient  and reviewed medications and coordinated delivery.  This delivery to include:NO FILL NEEDED   Patient needs no refills.  Follow-Up:  Comptroller and Pharmacist Review   [place] Shannon, [place] [facility] [place] [phone]  Reviewed by: De Blanch, PharmD Clinical Pharmacist [place] [facility] at [facility] [phone]

## 2020-05-30 ENCOUNTER — Telehealth: Payer: Medicare Other

## 2020-05-31 ENCOUNTER — Ambulatory Visit: Payer: Medicare Other | Admitting: Pharmacist

## 2020-05-31 DIAGNOSIS — R7303 Prediabetes: Secondary | ICD-10-CM

## 2020-05-31 DIAGNOSIS — I10 Essential (primary) hypertension: Secondary | ICD-10-CM

## 2020-05-31 DIAGNOSIS — E785 Hyperlipidemia, unspecified: Secondary | ICD-10-CM

## 2020-05-31 NOTE — Patient Instructions (Signed)
Visit Information  Goals Addressed            This Visit's Progress    Chronic Care Management Pharmacy Care Plan       CARE PLAN ENTRY (see longitudinal plan of care for additional care plan information)  Current Barriers:   Chronic Disease Management support, education, and care coordination needs related to Pre-Diabetes, Hypertension, Hx of CVA, Noncompaction Cardiomyopathy, Lupus, Gout, Pain   Hypertension BP Readings from Last 3 Encounters:  03/14/20 120/78  02/23/20 118/68  11/07/19 114/76    Pharmacist Clinical Goal(s): o Over the next 180 days, patient will work with PharmD and providers to maintain BP goal <140/90  Current regimen:   Losartan 50mg  daily  Hctz 25mg  every other Jamielynn Wigley  Metoprolol succinate 50mg  daily  Interventions: o Requested patient to check BP 2-3 times per week and record  Patient self care activities - Over the next 180 days, patient will: o Check BP 2-3 times per week, document, and provide at future appointments o Ensure daily salt intake < 2300 mg/Natsha Guidry  Hyperlipidemia Lab Results  Component Value Date/Time   LDLCALC 92 08/04/2019 11:31 AM    Pharmacist Clinical Goal(s): o Over the next 180 days, patient will work with PharmD and providers to achieve LDL goal < 70  Current regimen:  o Atorvastatin 20mg  daily  Interventions: o Collaboration with provider regarding medication management (atorvastatin 10mg  increased to 20mg  in March 2021)  Patient self care activities - Over the next 180 days, patient will: o Maintain cholesterol medication regimen. o Schedule lab visit for updated lipid panel  Pre-Diabetes Lab Results  Component Value Date/Time   HGBA1C 6.3 02/23/2020 09:48 AM   HGBA1C 6.1 08/04/2019 11:31 AM    Pharmacist Clinical Goal(s): o Over the next 180 days, patient will work with PharmD and providers to maintain A1c goal <6.5%  Current regimen:  o Diet and exercise management    Patient self care activities -  Over the next 180 days, patient will: o Maintain a1c <6.5%  Health Maintenance   Pharmacist Clinical Goal(s) o Over the next 180 days, patient will work with PharmD and providers to Remain up to date on health maintenance   Interventions: o Recommended patient complete Shingrix vaccine series  Patient self care activities - Over the next 180 days, patient will: o Complete Shingrix vaccine series if agreeable  Medication management  Pharmacist Clinical Goal(s): o Over the next 180 days, patient will work with PharmD and providers to maintain optimal medication adherence  Current pharmacy: [facility]  Interventions o Comprehensive medication review performed. o Utilize [facility] pharmacy for medication synchronization, packaging and delivery  Patient self care activities - Over the next 90 days, patient will: o Focus on medication adherence by filling medications appropriately  o Take medications as prescribed o Report any questions or concerns to PharmD and/or provider(s)  Please see past updates related to this goal by clicking on the "Past Updates" button in the selected goal         The patient verbalized understanding of instructions provided today and agreed to receive a mailed copy of patient instruction and/or educational materials.  Telephone follow up appointment with pharmacy team member scheduled for: 11/28/2020  Melvenia Beam Te[place] Cipriani, PharmD Clinical Pharmacist [place] [facility] at [facility] [phone]

## 2020-05-31 NOTE — Chronic Care Management (AMB) (Signed)
Chronic Care Management Pharmacy  Name: Amanda Saunders  MRN: [id number] DOB: 08/28/1955  Chief Complaint/ HPI  Amanda Saunders,  64 y.o. , female presents for their Follow-Up CCM visit with the clinical pharmacist via telephone due to COVID-19 Pandemic.  PCP : Darreld Mclean, MD  Their chronic conditions include: PreDM, HTN, Hx of CVA, Noncompaction Cardiomyopathy, Lupus, Gout, Pain  Office Visits: 02/23/20: Visit w/ Dr. Lorelei Saunders - Left lower back pain. Prescribed methocarbamol 500mg  Q 8 hours   Consult Visit: 04/03/20: Neuro visit w/ Dr. Tomi Saunders - Medication Overuse Headache. Continue gabapentin 600mg  BID. Limit pain relievers to no more than 2 days per week. RTC 4-6 months.  03/14/20: Cardio visit w/ Dr. Stanford Saunders - D/C aspirin   Medications: Outpatient Encounter Medications as of 05/31/2020  Medication Sig  . acetaminophen (TYLENOL) 325 MG tablet Take 2 tablets (650 mg total) by mouth every 4 (four) hours as needed for mild pain (or temp > 37.5 C (99.5 F)).  Marland Kitchen apixaban (ELIQUIS) 5 MG TABS tablet Take 1 tablet (5 mg total) by mouth 2 (two) times daily.  Marland Kitchen atorvastatin (LIPITOR) 20 MG tablet Take 1 tablet (20 mg total) by mouth daily.  . folic acid (FOLVITE) 1 MG tablet Take 1 mg by mouth daily.  Marland Kitchen gabapentin (NEURONTIN) 600 MG tablet TAKE ONE TABLET BY MOUTH EVERY MORNING and TAKE ONE TABLET BY MOUTH EVERYDAY AT BEDTIME  . hydrochlorothiazide (HYDRODIURIL) 25 MG tablet Take 1 tablet (25 mg total) by mouth every other Amanda Saunders.  . hydroxychloroquine (PLAQUENIL) 200 MG tablet Take 200 mg by mouth 2 (two) times daily.  Marland Kitchen losartan (COZAAR) 50 MG tablet Take 1 tablet (50 mg total) by mouth daily.  . meclizine (ANTIVERT) 25 MG tablet TAKE ONE TABLET BY MOUTH twice daily AS NEEDED FOR dizziness  . methocarbamol (ROBAXIN) 500 MG tablet Take 1 tablet (500 mg total) by mouth every 8 (eight) hours as needed for muscle spasms.  . metoprolol succinate (TOPROL-XL) 25 MG 24 hr tablet Take 1  tablet (25 mg total) by mouth daily.  . naproxen sodium (ANAPROX) 550 MG tablet TAKE 1 TABLET BY MOUTH TWICE DAILY WITH MEALS AS NEEDED FOR PAIN- use sparingly  . ORENCIA CLICKJECT 619 MG/ML SOAJ Inject 1 Syringe into the skin once a week.   No facility-administered encounter medications on file as of 05/31/2020.   SDOH Screenings   Alcohol Screen:   . Last Alcohol Screening Score (AUDIT): Not on file  Depression (PHQ2-9): Low Risk   . PHQ-2 Score: 0  Financial Resource Strain: Low Risk   . Difficulty of Paying Living Expenses: Not very hard  Food Insecurity: No Food Insecurity  . Worried About Charity fundraiser in the Last Year: Never true  . Ran Out of Food in the Last Year: Never true  Housing: Low Risk   . Last Housing Risk Score: 0  Physical Activity:   . Days of Exercise per Week: Not on file  . Minutes of Exercise per Session: Not on file  Social Connections:   . Frequency of Communication with Friends and Family: Not on file  . Frequency of Social Gatherings with Friends and Family: Not on file  . Attends Religious Services: Not on file  . Active Member of Clubs or Organizations: Not on file  . Attends Archivist Meetings: Not on file  . Marital Status: Not on file  Stress:   . Feeling of Stress : Not on file  Tobacco Use:  Medium Risk  . Smoking Tobacco Use: Former Smoker  . Smokeless Tobacco Use: Never Used  Transportation Needs: No Transportation Needs  . Lack of Transportation (Medical): No  . Lack of Transportation (Non-Medical): No    Current Diagnosis/Assessment:  Goals Addressed            This Visit's Progress   . Chronic Care Management Pharmacy Care Plan       CARE PLAN ENTRY (see longitudinal plan of care for additional care plan information)  Current Barriers:  . Chronic Disease Management support, education, and care coordination needs related to Pre-Diabetes, Hypertension, Hx of CVA, Noncompaction Cardiomyopathy, Lupus, Gout,  Pain   Hypertension BP Readings from Last 3 Encounters:  03/14/20 120/78  02/23/20 118/68  11/07/19 114/76   . Pharmacist Clinical Goal(s): o Over the next 180 days, patient will work with PharmD and providers to maintain BP goal <140/90 . Current regimen:   Losartan 50mg  daily  Hctz 25mg  every other Amanda Saunders  Metoprolol succinate 50mg  daily . Interventions: o Requested patient to check BP 2-3 times per week and record . Patient self care activities - Over the next 180 days, patient will: o Check BP 2-3 times per week, document, and provide at future appointments o Ensure daily salt intake < 2300 mg/Amanda Saunders  Hyperlipidemia Lab Results  Component Value Date/Time   LDLCALC 92 08/04/2019 11:31 AM   . Pharmacist Clinical Goal(s): o Over the next 180 days, patient will work with PharmD and providers to achieve LDL goal < 70 . Current regimen:  o Atorvastatin 20mg  daily . Interventions: o Collaboration with provider regarding medication management (atorvastatin 10mg  increased to 20mg  in March 2021) . Patient self care activities - Over the next 180 days, patient will: o Maintain cholesterol medication regimen. o Schedule lab visit for updated lipid panel  Pre-Diabetes Lab Results  Component Value Date/Time   HGBA1C 6.3 02/23/2020 09:48 AM   HGBA1C 6.1 08/04/2019 11:31 AM   . Pharmacist Clinical Goal(s): o Over the next 180 days, patient will work with PharmD and providers to maintain A1c goal <6.5% . Current regimen:  o Diet and exercise management   . Patient self care activities - Over the next 180 days, patient will: o Maintain a1c <6.5%  Health Maintenance  . Pharmacist Clinical Goal(s) o Over the next 180 days, patient will work with PharmD and providers to Remain up to date on health maintenance  . Interventions: o Recommended patient complete Shingrix vaccine series . Patient self care activities - Over the next 180 days, patient will: o Complete Shingrix vaccine  series if agreeable  Medication management . Pharmacist Clinical Goal(s): o Over the next 180 days, patient will work with PharmD and providers to maintain optimal medication adherence . Current pharmacy: [facility] . Interventions o Comprehensive medication review performed. o Utilize [facility] pharmacy for medication synchronization, packaging and delivery . Patient self care activities - Over the next 90 days, patient will: o Focus on medication adherence by filling medications appropriately  o Take medications as prescribed o Report any questions or concerns to PharmD and/or provider(s)  Please see past updates related to this goal by clicking on the "Past Updates" button in the selected goal        Social Hx:  Retired from [employer]. One child."A bunch of grandchildren"   Her grand daughter helps her remember to take her meds (64 year old) Has a medicine box Takes bottles out as she needs   Update 05/31/20 She  will be starting a new job at [employer]. She is very excited about this.  Hypertension   BP Goal <140/90  Office blood pressures are  BP Readings from Last 3 Encounters:  03/14/20 120/78  02/23/20 118/68  11/07/19 114/76   Patient has failed these meds in the past: None noted  Patient is currently controlled on the following medications:   Losartan 50mg  daily  Hctz 25mg  every other Acxel Dingee  Metoprolol succinate 50mg  daily  From 09/30/19 Visit Metoprolol last fill date is 05/16/19 for 90 DS.  HCTZ: rx states to take every other Anniebell Bedore, but patient only taking every Tu/Th Patient would benefit from adherence packaging  Update Feels BP is doing ok.   Patient checks BP at home infrequently (when symptomatic)  Patient home BP readings are ranging: States her BP was 117/73 yesterday  Update 05/31/20 Has not been checking BP because she has forgotten, but states she will start checking and recording readings.  Plan -Check blood pressure twice a week and  record readings  -Continue current medications     Hx of CVA (ASCVD)   LDL goal <70  Lipid Panel     Component Value Date/Time   CHOL 153 08/04/2019 1131   TRIG 68.0 08/04/2019 1131   HDL 47.50 08/04/2019 1131   CHOLHDL 3 08/04/2019 1131   VLDL 13.6 08/04/2019 1131   [place] 92 08/04/2019 1131     ASCVD 10-year risk: Hx of ASCVD  Patient has failed these meds in past: None noted Patient is currently uncontrolled on the following medications:   Atorvastatin 20mg  daily  Dr. Lorelei Saunders was agreeable to increasing atorvastatin. Patient has been taking it consistently. Will follow up with lipid panel at next visit  Update 05/31/20 Dr. Stanford Saunders stopped aspirin as patient is also on Eliquis. Pt has not had updated lipid panel since increase to atorvastatin 20mg  in March.  Pt would benefit from updated lipid panel.   Plan -Continue current medications  -Schedule to get lipid panel drawn  Pre-Diabetes   A1c goal <7%  Recent Relevant Labs: Lab Results  Component Value Date/Time   HGBA1C 6.3 02/23/2020 09:48 AM   HGBA1C 6.1 08/04/2019 11:31 AM   GFR 78.55 02/23/2020 09:48 AM   GFR 83.03 08/04/2019 11:31 AM   Patient has failed these meds in past: None noted  Patient is currently controlled on the following medications: . None  A1c relatively stable although trending up. Will continue to monitor.  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  Patient would benefit from adherence packaging.  Update Patient is now taking Eliquis twice daily as prescribed. She remembers to take second dose.  Update 05/31/20 Stopped aspirin per Dr. Stanford Saunders  Plan -Continue current medications   Lupus    Patient has failed these  meds in past: None noted  Patient is currently controlled on the following medications:   Hydroxychloroquine 200mg  twice daily  Orencia 125mg /mL 1 syringe weekly (getting through PAP)  Folic acid 1mg  daily  From 09/30/19 Visit Everything is going well Followed by Marella Chimes, PA-C (Added provider to patient's healthcare team) Next appt in May/June 2021  Update Started Orencia yesterday and was shown how to use it. Coming from [facility]. Taking once weekly on  Tuesdays. Maureen Chatters replaces methotrexate  Update 05/31/20 States she is still in pain, but likes her current regimen  Plan -Continue current medications      Miscellaneous Meclizine 25mg  (last used last week for vertigo)   [facility] Reviewed patient's [facility] medication and Epic medication profile assuring there are no discrepancies or gaps in therapy. Confirmed all fill dates appropriate and verified with patient that there is a sufficient quantity of all prescribed medications at home. Informed patient to call me any time if needing medications before scheduled deliveries. The next anticipated medication sync date is 07/18/20.  De Blanch, PharmD Clinical Pharmacist [facility] [phone]

## 2020-06-01 ENCOUNTER — Other Ambulatory Visit: Payer: Medicare Other

## 2020-06-08 ENCOUNTER — Other Ambulatory Visit (INDEPENDENT_AMBULATORY_CARE_PROVIDER_SITE_OTHER): Payer: Medicare Other

## 2020-06-08 ENCOUNTER — Other Ambulatory Visit: Payer: Self-pay

## 2020-06-08 ENCOUNTER — Encounter: Payer: Self-pay | Admitting: Family Medicine

## 2020-06-08 DIAGNOSIS — E785 Hyperlipidemia, unspecified: Secondary | ICD-10-CM

## 2020-06-08 LAB — LIPID PANEL
Cholesterol: 108 mg/dL (ref 0–200)
HDL: 38.8 mg/dL — ABNORMAL LOW (ref >39.00)
LDL Cholesterol: 54 mg/dL (ref 0–99)
NonHDL: 69.21
Total CHOL/HDL Ratio: 3
Triglycerides: 76 mg/dL (ref 0.0–149.0)
VLDL: 15.2 mg/dL (ref 0.0–40.0)

## 2020-06-08 NOTE — Addendum Note (Signed)
Addended by: Manuela Schwartz on: 06/08/2020 09:23 AM   Modules accepted: Orders

## 2020-06-09 MED ORDER — ROSUVASTATIN CALCIUM 20 MG PO TABS: 20.0000 mg | ORAL_TABLET | Freq: Every day | ORAL | 3 refills | Status: DC

## 2020-06-13 ENCOUNTER — Telehealth: Payer: Self-pay | Admitting: Pharmacist

## 2020-06-13 NOTE — Progress Notes (Addendum)
° ° °  Chronic Care Management Pharmacy Assistant   Name: Amanda Saunders  MRN: [id number] DOB: 1955-11-07  Reason for Encounter: Medication Review  Patient Questions:  1.  Have you seen any other providers since your last visit? No  2.  Any changes in your medicines or health? Yes  PCP : Copland, Gay Filler, MD   Their chronic conditions include: PreDM, HTN, Hx of CVA, Noncompaction Cardiomyopathy, Lupus, Gout, Pain  Allergies:   Allergies  Allergen Reactions   Codeine Nausea And Vomiting    Medications: Outpatient Encounter Medications as of 06/13/2020  Medication Sig   acetaminophen (TYLENOL) 325 MG tablet Take 2 tablets (650 mg total) by mouth every 4 (four) hours as needed for mild pain (or temp > 37.5 C (99.5 F)).   apixaban (ELIQUIS) 5 MG TABS tablet Take 1 tablet (5 mg total) by mouth 2 (two) times daily.   folic acid (FOLVITE) 1 MG tablet Take 1 mg by mouth daily.   gabapentin (NEURONTIN) 600 MG tablet TAKE ONE TABLET BY MOUTH EVERY MORNING and TAKE ONE TABLET BY MOUTH EVERYDAY AT BEDTIME   hydrochlorothiazide (HYDRODIURIL) 25 MG tablet Take 1 tablet (25 mg total) by mouth every other day.   hydroxychloroquine (PLAQUENIL) 200 MG tablet Take 200 mg by mouth 2 (two) times daily.   losartan (COZAAR) 50 MG tablet Take 1 tablet (50 mg total) by mouth daily.   meclizine (ANTIVERT) 25 MG tablet TAKE ONE TABLET BY MOUTH twice daily AS NEEDED FOR dizziness   methocarbamol (ROBAXIN) 500 MG tablet Take 1 tablet (500 mg total) by mouth every 8 (eight) hours as needed for muscle spasms.   metoprolol succinate (TOPROL-XL) 25 MG 24 hr tablet Take 1 tablet (25 mg total) by mouth daily.   naproxen sodium (ANAPROX) 550 MG tablet TAKE 1 TABLET BY MOUTH TWICE DAILY WITH MEALS AS NEEDED FOR PAIN- use sparingly   ORENCIA CLICKJECT 196 MG/ML SOAJ Inject 1 Syringe into the skin once a week.   rosuvastatin (CRESTOR) 20 MG tablet Take 1 tablet (20 mg total) by mouth daily.   No  facility-administered encounter medications on file as of 06/13/2020.    Current Diagnosis: Patient Active Problem List   Diagnosis Date Noted   Systemic lupus erythematosus ([place]) 08/04/2019   Acute ischemic stroke ([place]) 08/14/2018   HTN (hypertension) 08/14/2018   Pre-diabetes 04/03/2016   History of CVA (cerebrovascular accident) 03/12/2016   Chest pain at rest 03/12/2016   Peripheral edema 03/12/2016    Goals Addressed   None    06-12-20 Pharmacy reports receiving script for rosuvastatin 20 mg.  PCP changed Atorvastatin to Rosuvastatin due to patient complaining of body aches.  Follow-Up:  Pharmacist Review   [place] Shannon, [facility] [place] Pharmacist Assistant [phone]  Reviewed by: De Blanch, PharmD Clinical Pharmacist [facility] [phone]

## 2020-06-18 ENCOUNTER — Telehealth: Payer: Self-pay | Admitting: Pharmacist

## 2020-06-18 NOTE — Progress Notes (Addendum)
Chronic Care Management Pharmacy Assistant   Name: PAYTIN RAMAKRISHNAN  MRN: [id number] DOB: 14-Jan-1956  Reason for Encounter: Medication Review  Patient Questions:  1.  Have you seen any other providers since your last visit? No  2.  Any changes in your medicines or health? No    PCP : Copland, Gay Filler, MD  Allergies:   Allergies  Allergen Reactions   Codeine Nausea And Vomiting    Medications: Outpatient Encounter Medications as of 06/18/2020  Medication Sig   acetaminophen (TYLENOL) 325 MG tablet Take 2 tablets (650 mg total) by mouth every 4 (four) hours as needed for mild pain (or temp > 37.5 C (99.5 F)).   apixaban (ELIQUIS) 5 MG TABS tablet Take 1 tablet (5 mg total) by mouth 2 (two) times daily.   folic acid (FOLVITE) 1 MG tablet Take 1 mg by mouth daily.   gabapentin (NEURONTIN) 600 MG tablet TAKE ONE TABLET BY MOUTH EVERY MORNING and TAKE ONE TABLET BY MOUTH EVERYDAY AT BEDTIME   hydrochlorothiazide (HYDRODIURIL) 25 MG tablet Take 1 tablet (25 mg total) by mouth every other day.   hydroxychloroquine (PLAQUENIL) 200 MG tablet Take 200 mg by mouth 2 (two) times daily.   losartan (COZAAR) 50 MG tablet Take 1 tablet (50 mg total) by mouth daily.   meclizine (ANTIVERT) 25 MG tablet TAKE ONE TABLET BY MOUTH twice daily AS NEEDED FOR dizziness   methocarbamol (ROBAXIN) 500 MG tablet Take 1 tablet (500 mg total) by mouth every 8 (eight) hours as needed for muscle spasms.   metoprolol succinate (TOPROL-XL) 25 MG 24 hr tablet Take 1 tablet (25 mg total) by mouth daily.   naproxen sodium (ANAPROX) 550 MG tablet TAKE 1 TABLET BY MOUTH TWICE DAILY WITH MEALS AS NEEDED FOR PAIN- use sparingly   ORENCIA CLICKJECT 465 MG/ML SOAJ Inject 1 Syringe into the skin once a week.   rosuvastatin (CRESTOR) 20 MG tablet Take 1 tablet (20 mg total) by mouth daily.   No facility-administered encounter medications on file as of 06/18/2020.    Current Diagnosis: Patient Active Problem List     Diagnosis Date Noted   Systemic lupus erythematosus ([place]) 08/04/2019   Acute ischemic stroke ([place]) 08/14/2018   HTN (hypertension) 08/14/2018   Pre-diabetes 04/03/2016   History of CVA (cerebrovascular accident) 03/12/2016   Chest pain at rest 03/12/2016   Peripheral edema 03/12/2016    Goals Addressed   None    Reviewed chart for medication changes ahead of medication coordination call.  No OVs, Consults, or hospital visits since last care coordination call.  No medication changes indicated.  BP Readings from Last 3 Encounters:  03/14/20 120/78  02/23/20 118/68  11/07/19 114/76    Lab Results  Component Value Date   HGBA1C 6.3 02/23/2020     Patient obtains medications through Adherence Packaging  90 Days   Last adherence delivery included: Losartan Potassium 50 mg daily-Breakfast  Hydroxychloroquine 200 mg twice daily-Breakfast, Bedtime  HCTZ 25 mg one tablet -Monday, Wednesday, and Friday-Breakfast Eliquis 5 mg twice daily-Breakfast, Bedtime  Atorvastatin 20 mg daily - Bedtime  Metoprolol Succinate 25 mg ER daily-Breakfast  Gabapentin 600 mg one tab twice a day- Breakfast, Bedtime  Meclizine 25 mg as needed vial   Patient did not decline medications last month   Patient is not due for an adherence delivery at this time. Called patient and reviewed medications and coordinated delivery.   Patient declined the following medications medications due to receiving  a 90 DS on 04-20-2020  Losartan Potassium 50 mg daily-Breakfast  Hydroxychloroquine 200 mg twice daily-Breakfast, Bedtime  HCTZ 25 mg one tablet -Monday, Wednesday, and Friday-Breakfast Eliquis 5 mg twice daily-Breakfast, Bedtime  Atorvastatin 20 mg daily - Bedtime  Metoprolol Succinate 25 mg ER daily-Breakfast  Gabapentin 600 mg one tab twice a day- Breakfast, Bedtime  Meclizine 25 mg as needed vial   Patient does not needs refills at this time.   Follow-Up:  Coordination of Enhanced Pharmacy  Services and Pharmacist Review   Fanny Skates, [place] Pharmacist Assistant [phone]  Reviewed by: De Blanch, PharmD Clinical Pharmacist [facility] at [facility] [phone]

## 2020-06-25 DIAGNOSIS — R945 Abnormal results of liver function studies: Secondary | ICD-10-CM | POA: Diagnosis not present

## 2020-06-25 DIAGNOSIS — M329 Systemic lupus erythematosus, unspecified: Secondary | ICD-10-CM | POA: Diagnosis not present

## 2020-06-25 DIAGNOSIS — M0609 Rheumatoid arthritis without rheumatoid factor, multiple sites: Secondary | ICD-10-CM | POA: Diagnosis not present

## 2020-06-25 DIAGNOSIS — M7989 Other specified soft tissue disorders: Secondary | ICD-10-CM | POA: Diagnosis not present

## 2020-06-26 ENCOUNTER — Telehealth: Payer: Self-pay | Admitting: Pharmacist

## 2020-06-26 NOTE — Progress Notes (Addendum)
Patient contacted me to confirm her mailing address. [facility] pharmacy tried to reach this patient to confirm her address for a delivery of Methotrexate Sodium 25 mg/ml injection solution and Folic Acid 1 mg tab. Provided the following address for delivery to [facility]: [address] [place] [phone]. She would also like to cancel the Folic Acid order due to having enough on hand. Ms. Holle was unsure how she would inject herself the medication without needles. Advised that I would contact Dr. Earl Lites office for a prescription on her behalf.  Contacted Dr. Earl Lites office to request a prescription for needles to be sent to [facility] pharmacy in order for the patient to inject Methotrexate. Spoke with Caryl Pina who stated she would sent the script over.  Contacted the patient to inform, left her a message.  Fanny Skates, [place] Pharmacist Assistant [phone]  Reviewed by: De Blanch, PharmD, BCACP Clinical Pharmacist [place] [facility] at [facility] [phone]

## 2020-07-08 ENCOUNTER — Other Ambulatory Visit: Payer: Self-pay | Admitting: Cardiology

## 2020-07-08 ENCOUNTER — Other Ambulatory Visit: Payer: Self-pay | Admitting: Family Medicine

## 2020-07-08 DIAGNOSIS — I10 Essential (primary) hypertension: Secondary | ICD-10-CM

## 2020-07-08 DIAGNOSIS — R072 Precordial pain: Secondary | ICD-10-CM

## 2020-07-10 ENCOUNTER — Encounter: Payer: Self-pay | Admitting: Family Medicine

## 2020-07-11 ENCOUNTER — Ambulatory Visit: Payer: Medicare Other | Admitting: Family Medicine

## 2020-07-11 NOTE — Progress Notes (Deleted)
[facility] at [facility] [address], [address] [place], [name] [address] [phone]  Date:  07/11/2020   Name:  Amanda Saunders   DOB:  Jul 26, 1955   MRN:  [id number]  PCP:  Pearline Cables, MD    Chief Complaint: No chief complaint on file.   History of Present Illness:  Amanda Saunders is a 64 y.o. very pleasant female patient who presents with the following:  Patient contacted me yesterday with concern of abdominal pain History of stroke, hypertension, prediabetes, lupus We had the following my chart evaluation yesterday:  Hi Amanda Saunders- I am so sorry to hear you are not feeling well! If you are having abdominal pain and vomiting you should be seen sooner than next week!  I am not in today, but hopefully one of my partners can see you.  Please call the office when it opens at 7:30- 8am.  If you are not able to get an appt it may be a good time to visit urgent care.  I can also likely get you in tomorrow if you like, but would not necessarily wait even that long.  I am not sure what medication would help you at this point.  It is possible that you are having gastritis or acid reflux- some tums or other acid reducer might help and is unlikely to hurt anything JC  Last read by Con Memos at 7:14 AM on 07/10/2020.  Adara, Kittle to Me    07/10/20 3:18 AM Good morning Dr Dallas Schimke, I would like 2 make an appt to see you prayerfully next wk. I'm having some discomfort just below rib cage center.  The vomiting just started just now, my mouth got watery & there you go vomiting. Now I'm not panicking because the vomiting could be a side effect to the booster shot I got Mon; but the pain isn't. If you can prescribe something (even if over the counter) until I can come in; it would be greatly appreciated.   Flu vaccine COVID-19 booster  Patient Active Problem List   Diagnosis Date Noted  . Systemic lupus erythematosus  (HCC) 08/04/2019  . Acute ischemic stroke (HCC) 08/14/2018  . HTN (hypertension) 08/14/2018  . Pre-diabetes 04/03/2016  . History of CVA (cerebrovascular accident) 03/12/2016  . Chest pain at rest 03/12/2016  . Peripheral edema 03/12/2016    Past Medical History:  Diagnosis Date  . Cardiomyopathy (HCC)   . Hypertension   . Lupus (HCC)   . PUD (peptic ulcer disease)   . Stroke (HCC) 08/13/2018  . Vertigo     Past Surgical History:  Procedure Laterality Date  . BACK SURGERY    . CARPAL TUNNEL RELEASE    . NECK SURGERY     2015    Social History   Tobacco Use  . Smoking status: Former Smoker    Quit date: 07/21/2004    Years since quitting: 15.9  . Smokeless tobacco: Never Used  Vaping Use  . Vaping Use: Never used  Substance Use Topics  . Alcohol use: Yes    Comment: occasional  . Drug use: No    Family History  Problem Relation Age of Onset  . Hypertension Mother   . Arthritis Mother   . Heart failure Mother   . Stroke Mother   . Kidney disease Father   . Hypertension Father     Allergies  Allergen Reactions  . Codeine Nausea And  Vomiting    Medication list has been reviewed and updated.  Current Outpatient Medications on File Prior to Visit  Medication Sig Dispense Refill  . acetaminophen (TYLENOL) 325 MG tablet Take 2 tablets (650 mg total) by mouth every 4 (four) hours as needed for mild pain (or temp > 37.5 C (99.5 F)).    Marland Kitchen ELIQUIS 5 MG TABS tablet TAKE ONE TABLET BY MOUTH AT BREAKFAST AND AT BEDTIME 812 tablet 1  . folic acid (FOLVITE) 1 MG tablet Take 1 mg by mouth daily.    Marland Kitchen gabapentin (NEURONTIN) 600 MG tablet TAKE ONE TABLET BY MOUTH EVERY MORNING and TAKE ONE TABLET BY MOUTH EVERYDAY AT BEDTIME 60 tablet 5  . hydrochlorothiazide (HYDRODIURIL) 25 MG tablet Take 1 tablet (25 mg total) by mouth every other day. 45 tablet 3  . hydroxychloroquine (PLAQUENIL) 200 MG tablet Take 200 mg by mouth 2 (two) times daily.    Marland Kitchen losartan (COZAAR) 50 MG  tablet Take 1 tablet (50 mg total) by mouth daily. 90 tablet 3  . meclizine (ANTIVERT) 25 MG tablet TAKE ONE TABLET BY MOUTH twice daily AS NEEDED FOR dizziness 90 tablet 1  . methocarbamol (ROBAXIN) 500 MG tablet Take 1 tablet (500 mg total) by mouth every 8 (eight) hours as needed for muscle spasms. 40 tablet 0  . metoprolol succinate (TOPROL-XL) 25 MG 24 hr tablet Take 1 tablet (25 mg total) by mouth every morning. 90 tablet 0  . naproxen sodium (ANAPROX) 550 MG tablet TAKE 1 TABLET BY MOUTH TWICE DAILY WITH MEALS AS NEEDED FOR PAIN- use sparingly 90 tablet 0  . ORENCIA CLICKJECT 751 MG/ML SOAJ Inject 1 Syringe into the skin once a week.    . rosuvastatin (CRESTOR) 20 MG tablet Take 1 tablet (20 mg total) by mouth daily. 90 tablet 3   No current facility-administered medications on file prior to visit.    Review of Systems:  As per HPI- otherwise negative.   Physical Examination: There were no vitals filed for this visit. There were no vitals filed for this visit. There is no height or weight on file to calculate BMI. Ideal Body Weight:    GEN: no acute distress. HEENT: Atraumatic, Normocephalic.  Ears and Nose: No external deformity. CV: RRR, No M/G/R. No JVD. No thrill. No extra heart sounds. PULM: CTA B, no wheezes, crackles, rhonchi. No retractions. No resp. distress. No accessory muscle use. ABD: S, NT, ND, +BS. No rebound. No HSM. EXTR: No c/c/e PSYCH: Normally interactive. Conversant.    Assessment and Plan: *** This visit occurred during the SARS-CoV-2 public health emergency.  Safety protocols were in place, including screening questions prior to the visit, additional usage of staff PPE, and extensive cleaning of exam room while observing appropriate contact time as indicated for disinfecting solutions.    Signed Lamar Blinks, MD

## 2020-07-13 NOTE — Progress Notes (Signed)
[place] at [facility] [address], [place], [place] [address] [phone]  Date:  07/16/2020   Name:  Amanda Saunders   DOB:  10-04-1955   MRN:  [id number]  PCP:  Darreld Mclean, MD    Chief Complaint: Abdominal Pain (Upper abdominal pain, one month, some nausea and vomiting/)   History of Present Illness:  Amanda Saunders is a 64 y.o. very pleasant female patient who presents with the following:  Patient with history of stroke, prediabetes, lupus, hypertension She had contacted [place] recently with concern of abdominal pain and had made her an urgent appointment last week-this was canceled and appointment scheduled for today.  Pt states she did not want to cancel the appt but had to due to work  Today she has concern of epigastric pain off and on for about one month She notes that she will get the pain, then her mouth starts watering and she may vomit She is using naprosyn when the pain occurs-this does help, but I advised her NSAIDs may contribute to gastritis or ulcer She has had an ulcer in the past- maybe 10 year ago or so, this was treated by her GI doctor in [place]; he diagnosed her ulcer on upper GI.  She has not been seen by a local GI doctor since moving to this area This does remind her of when she had the ulcer last time No blood in her vomit She did see ?some streaks of blood in her stool recently she thinks last week-not totally sure  She also notes that she lives on the 2nd floor- when she walks up a flight of steps she will feel SOB- no CP.   This is stable however, no change.  She did a Myoview back in 2017, she does not feel her shortness of breath is changed since that time Her last colon was in 2013 in [place] She is using tums but does not think it is helping her with current symptoms  Flu vaccine- declines  COVID-19 booster- done  Patient Active Problem List   Diagnosis Date Noted  .  Systemic lupus erythematosus ([place]) 08/04/2019  . Acute ischemic stroke ([place]) 08/14/2018  . HTN (hypertension) 08/14/2018  . Pre-diabetes 04/03/2016  . History of CVA (cerebrovascular accident) 03/12/2016  . Chest pain at rest 03/12/2016  . Peripheral edema 03/12/2016    Past Medical History:  Diagnosis Date  . Cardiomyopathy ([place])   . Hypertension   . Lupus ([employer])   . PUD (peptic ulcer disease)   . Stroke ([place]) 08/13/2018  . Vertigo     Past Surgical History:  Procedure Laterality Date  . BACK SURGERY    . CARPAL TUNNEL RELEASE    . NECK SURGERY     2015    Social History   Tobacco Use  . Smoking status: Former Smoker    Quit date: 07/21/2004    Years since quitting: 15.9  . Smokeless tobacco: Never Used  Vaping Use  . Vaping Use: Never used  Substance Use Topics  . Alcohol use: Yes    Comment: occasional  . Drug use: No    Family History  Problem Relation Age of Onset  . Hypertension Mother   . Arthritis Mother   . Heart failure Mother   . Stroke Mother   . Kidney disease Father   . Hypertension Father     Allergies  Allergen Reactions  .  Codeine Nausea And Vomiting    Medication list has been reviewed and updated.  Current Outpatient Medications on File Prior to Visit  Medication Sig Dispense Refill  . acetaminophen (TYLENOL) 325 MG tablet Take 2 tablets (650 mg total) by mouth every 4 (four) hours as needed for mild pain (or temp > 37.5 C (99.5 F)).    Marland Kitchen ELIQUIS 5 MG TABS tablet TAKE ONE TABLET BY MOUTH AT BREAKFAST AND AT BEDTIME 875 tablet 1  . folic acid (FOLVITE) 1 MG tablet Take 1 mg by mouth daily.    Marland Kitchen gabapentin (NEURONTIN) 600 MG tablet TAKE ONE TABLET BY MOUTH EVERY MORNING and TAKE ONE TABLET BY MOUTH EVERYDAY AT BEDTIME 60 tablet 5  . hydrochlorothiazide (HYDRODIURIL) 25 MG tablet Take 1 tablet (25 mg total) by mouth every other day. 45 tablet 3  . hydroxychloroquine (PLAQUENIL) 200 MG tablet Take 200 mg by mouth 2 (two) times daily.    .  meclizine (ANTIVERT) 25 MG tablet TAKE ONE TABLET BY MOUTH twice daily AS NEEDED FOR dizziness 90 tablet 1  . methocarbamol (ROBAXIN) 500 MG tablet Take 1 tablet (500 mg total) by mouth every 8 (eight) hours as needed for muscle spasms. 40 tablet 0  . metoprolol succinate (TOPROL-XL) 25 MG 24 hr tablet Take 1 tablet (25 mg total) by mouth every morning. 90 tablet 0  . naproxen sodium (ANAPROX) 550 MG tablet TAKE 1 TABLET BY MOUTH TWICE DAILY WITH MEALS AS NEEDED FOR PAIN- use sparingly 90 tablet 0  . ORENCIA CLICKJECT 643 MG/ML SOAJ Inject 1 Syringe into the skin once a week.    . rosuvastatin (CRESTOR) 20 MG tablet Take 1 tablet (20 mg total) by mouth daily. 90 tablet 3  . losartan (COZAAR) 50 MG tablet Take 1 tablet (50 mg total) by mouth daily. 90 tablet 3   No current facility-administered medications on file prior to visit.    Review of Systems:  As per HPI- otherwise negative.   Physical Examination: Vitals:   07/16/20 0828  BP: 118/88  Pulse: 66  Resp: 16  SpO2: 97%   Vitals:   07/16/20 0828  Weight: 190 lb (86.2 kg)  Height: 5\' 3"  (1.6 m)   Body mass index is 33.66 kg/m. Ideal Body Weight: Weight in (lb) to have BMI = 25: 140.8  GEN: no acute distress.  Obese, looks well  HEENT: Atraumatic, Normocephalic.  Ears and Nose: No external deformity. CV: RRR, No M/G/R. No JVD. No thrill. No extra heart sounds. PULM: CTA B, no wheezes, crackles, rhonchi. No retractions. No resp. distress. No accessory muscle use. ABD: S,  ND, +BS. No rebound. No HSM.  Epigastric tenderness only  EXTR: No c/c/e PSYCH: Normally interactive. Conversant.  Hemoccult test negative for blood  EKG: sinus bradycardia, no ST elevation or depression.  She does have low voltage in precordial leads, mild bradycardia Compared with tracing 07/2018- no significant change noted  Assessment and Plan: Epigastric pain - Plan: POC Hemoccult Bld/Stl (1-Cd Office Dx), sucralfate (CARAFATE) 1 g tablet,  omeprazole (PRILOSEC) 40 MG capsule, CBC, Comprehensive metabolic panel, H. pylori breath test, CANCELED: IFOBT POC (occult bld, rslt in office)  SOB (shortness of breath) - Plan: EKG 12-Lead, Troponin I (High Sensitivity)  History of ulcer disease - Plan: POC Hemoccult Bld/Stl (1-Cd Office Dx), H. pylori breath test, CANCELED: IFOBT POC (occult bld, rslt in office)  Patient today with epigastric pain concerning for ulcer or gastritis.  She has had an ulcer in the  past, has been taking NSAIDs to relieve her pain recently Will start on Carafate and proton pump inhibitor, H pylori pending She will let me know how she feels over the next few days, we can certainly get her in to see GI if necessary I did run a troponin due to shortness of breath This visit occurred during the SARS-CoV-2 public health emergency.  Safety protocols were in place, including screening questions prior to the visit, additional usage of staff PPE, and extensive cleaning of exam room while observing appropriate contact time as indicated for disinfecting solutions.    Signed Abbe Amsterdam, MD  Results for orders placed or performed in visit on 07/16/20  CBC  Result Value Ref Range   WBC 6.8 4.0 - 10.5 K/uL   RBC 4.79 3.87 - 5.11 Mil/uL   Platelets 222.0 150.0 - 400.0 K/uL   Hemoglobin 13.3 12.0 - 15.0 g/dL   HCT 10.1 75.1 - 02.5 %   MCV 84.1 78.0 - 100.0 fl   MCHC 32.9 30.0 - 36.0 g/dL   RDW 85.2 77.8 - 24.2 %  Comprehensive metabolic panel  Result Value Ref Range   Sodium 142 135 - 145 mEq/L   Potassium 4.7 3.5 - 5.1 mEq/L   Chloride 102 96 - 112 mEq/L   CO2 34 (H) 19 - 32 mEq/L   Glucose, Bld 84 70 - 99 mg/dL   BUN 21 6 - 23 mg/dL   Creatinine, Ser 3.53 0.40 - 1.20 mg/dL   Total Bilirubin 0.3 0.2 - 1.2 mg/dL   Alkaline Phosphatase 73 39 - 117 U/L   AST 26 0 - 37 U/L   ALT 40 (H) 0 - 35 U/L   Total Protein 7.0 6.0 - 8.3 g/dL   Albumin 4.2 3.5 - 5.2 g/dL   GFR 61.44 >31.54 mL/min   Calcium 9.9 8.4 -  10.5 mg/dL  POC Hemoccult Bld/Stl (1-Cd Office Dx)  Result Value Ref Range   Card #1 Date 07/16/2020    Fecal Occult Blood, POC Negative Negative  Troponin I (High Sensitivity)  Result Value Ref Range   High Sens Troponin I 5 2 - 17 ng/L

## 2020-07-16 ENCOUNTER — Encounter: Payer: Self-pay | Admitting: Family Medicine

## 2020-07-16 ENCOUNTER — Ambulatory Visit: Payer: Medicare Other | Admitting: Family Medicine

## 2020-07-16 ENCOUNTER — Other Ambulatory Visit: Payer: Self-pay

## 2020-07-16 ENCOUNTER — Telehealth: Payer: Self-pay | Admitting: Pharmacist

## 2020-07-16 ENCOUNTER — Ambulatory Visit (INDEPENDENT_AMBULATORY_CARE_PROVIDER_SITE_OTHER): Payer: Medicare Other | Admitting: Family Medicine

## 2020-07-16 VITALS — BP 118/88 | HR 66 | Resp 16 | Ht 63.0 in | Wt 190.0 lb

## 2020-07-16 DIAGNOSIS — Z87898 Personal history of other specified conditions: Secondary | ICD-10-CM

## 2020-07-16 DIAGNOSIS — R1013 Epigastric pain: Secondary | ICD-10-CM

## 2020-07-16 DIAGNOSIS — R0602 Shortness of breath: Secondary | ICD-10-CM

## 2020-07-16 LAB — COMPREHENSIVE METABOLIC PANEL
ALT: 40 U/L — ABNORMAL HIGH (ref 0–35)
AST: 26 U/L (ref 0–37)
Albumin: 4.2 g/dL (ref 3.5–5.2)
Alkaline Phosphatase: 73 U/L (ref 39–117)
BUN: 21 mg/dL (ref 6–23)
CO2: 34 mEq/L — ABNORMAL HIGH (ref 19–32)
Calcium: 9.9 mg/dL (ref 8.4–10.5)
Chloride: 102 mEq/L (ref 96–112)
Creatinine, Ser: 0.93 mg/dL (ref 0.40–1.20)
GFR: 65.5 mL/min (ref >60.00)
Glucose, Bld: 84 mg/dL (ref 70–99)
Potassium: 4.7 mEq/L (ref 3.5–5.1)
Sodium: 142 mEq/L (ref 135–145)
Total Bilirubin: 0.3 mg/dL (ref 0.2–1.2)
Total Protein: 7 g/dL (ref 6.0–8.3)

## 2020-07-16 LAB — CBC
HCT: 40.3 % (ref 36.0–46.0)
Hemoglobin: 13.3 g/dL (ref 12.0–15.0)
MCHC: 32.9 g/dL (ref 30.0–36.0)
MCV: 84.1 fl (ref 78.0–100.0)
Platelets: 222 10*3/uL (ref 150.0–400.0)
RBC: 4.79 Mil/uL (ref 3.87–5.11)
RDW: 15.4 % (ref 11.5–15.5)
WBC: 6.8 10*3/uL (ref 4.0–10.5)

## 2020-07-16 LAB — POC HEMOCCULT BLD/STL (OFFICE/1-CARD/DIAGNOSTIC): Fecal Occult Blood, POC: NEGATIVE

## 2020-07-16 LAB — TROPONIN I (HIGH SENSITIVITY): High Sens Troponin I: 5 ng/L (ref 2–17)

## 2020-07-16 MED ORDER — OMEPRAZOLE 40 MG PO CPDR: 40.0000 mg | DELAYED_RELEASE_CAPSULE | Freq: Every day | ORAL | 3 refills | Status: DC

## 2020-07-16 MED ORDER — SUCRALFATE 1 G PO TABS: 1.0000 g | ORAL_TABLET | Freq: Three times a day (TID) | ORAL | 0 refills | Status: DC

## 2020-07-16 NOTE — Patient Instructions (Addendum)
It does appear that you have an ulcer- we are going to try a couple of things to get you feeling better!  Please do stop using naprosyn at this time as it may be making you worse  I will be in touch with your blood work and H pylori test asap  We will use prilosec once a day for about 2 months, and carfate for 10 days for your stomach Let me know if getting worse!

## 2020-07-16 NOTE — Progress Notes (Addendum)
Chronic Care Management Pharmacy Assistant   Name: Amanda Saunders  MRN: [id number] DOB: [birth year]  Reason for Encounter: Medication Review  Patient Questions:  1.  Have you seen any other providers since your last visit? Yes  2.  Any changes in your medicines or health? Yes    PCP : Copland, Gay Filler, MD  Allergies:   Allergies  Allergen Reactions   Codeine Nausea And Vomiting    Medications: Outpatient Encounter Medications as of 07/16/2020  Medication Sig   acetaminophen (TYLENOL) 325 MG tablet Take 2 tablets (650 mg total) by mouth every 4 (four) hours as needed for mild pain (or temp > 37.5 C (99.5 F)).   ELIQUIS 5 MG TABS tablet TAKE ONE TABLET BY MOUTH AT BREAKFAST AND AT BEDTIME   folic acid (FOLVITE) 1 MG tablet Take 1 mg by mouth daily.   gabapentin (NEURONTIN) 600 MG tablet TAKE ONE TABLET BY MOUTH EVERY MORNING and TAKE ONE TABLET BY MOUTH EVERYDAY AT BEDTIME   hydrochlorothiazide (HYDRODIURIL) 25 MG tablet Take 1 tablet (25 mg total) by mouth every other day.   hydroxychloroquine (PLAQUENIL) 200 MG tablet Take 200 mg by mouth 2 (two) times daily.   losartan (COZAAR) 50 MG tablet Take 1 tablet (50 mg total) by mouth daily.   meclizine (ANTIVERT) 25 MG tablet TAKE ONE TABLET BY MOUTH twice daily AS NEEDED FOR dizziness   methocarbamol (ROBAXIN) 500 MG tablet Take 1 tablet (500 mg total) by mouth every 8 (eight) hours as needed for muscle spasms.   metoprolol succinate (TOPROL-XL) 25 MG 24 hr tablet Take 1 tablet (25 mg total) by mouth every morning.   naproxen sodium (ANAPROX) 550 MG tablet TAKE 1 TABLET BY MOUTH TWICE DAILY WITH MEALS AS NEEDED FOR PAIN- use sparingly   omeprazole (PRILOSEC) 40 MG capsule Take 1 capsule (40 mg total) by mouth daily.   ORENCIA CLICKJECT 0000000 MG/ML SOAJ Inject 1 Syringe into the skin once a week.   rosuvastatin (CRESTOR) 20 MG tablet Take 1 tablet (20 mg total) by mouth daily.   sucralfate (CARAFATE) 1 g tablet Take 1 tablet (1  g total) by mouth 4 (four) times daily -  with meals and at bedtime.   No facility-administered encounter medications on file as of 07/16/2020.    Current Diagnosis: Patient Active Problem List   Diagnosis Date Noted   Systemic lupus erythematosus ([place]) 08/04/2019   Acute ischemic stroke ([place]) 08/14/2018   HTN (hypertension) 08/14/2018   Pre-diabetes 04/03/2016   History of CVA (cerebrovascular accident) 03/12/2016   Chest pain at rest 03/12/2016   Peripheral edema 03/12/2016    Goals Addressed   None    Reviewed chart for medication changes ahead of medication coordination call.  Office Visits: 07-16-2020 (PCP) Patient presented in the office c/o abdominal pain with nausea and vomiting. Per the notes, patient appears to have an ulcer. Was advised to stop using naprosyn. Lab work and H pylori test was ordered. Medication changes: Omeprazole 40 mg daily. Sucralfate 1 gm four times a day with meals for 10 days.  No  Consults, or hospital visits since last care coordination call  Medication changes indicated. Atorvastatin 20 mg tab was discontinued 06-08-2020. Rosuvastatin 20 mg tab replaced the Atorvastatin due to body aches.  BP Readings from Last 3 Encounters:  07/16/20 118/88  03/14/20 120/78  02/23/20 118/68    Lab Results  Component Value Date   HGBA1C 6.3 02/23/2020     Patient obtains  medications through Adherence Packaging  90 Days   Last adherence delivery included:  Losartan Potassium 50 mg daily-Breakfast  Hydroxychloroquine 200 mg twice daily-Breakfast, Bedtime  HCTZ 25 mg one tablet -Monday, Wednesday, and Friday-Breakfast Eliquis 5 mg twice daily-Breakfast, Bedtime  Atorvastatin 20 mg daily - Bedtime  Metoprolol Succinate 25 mg ER daily-Breakfast  Gabapentin 600 mg one tab twice a day- Breakfast, Bedtime  Meclizine 25 mg as needed vial   Patient is due for next adherence delivery on: 07-18-2020. Called patient and reviewed medications and coordinated  delivery.  This delivery to include: Losartan Potassium 50 mg daily-Breakfast  Hydroxychloroquine 200 mg twice daily-Breakfast, Bedtime  HCTZ 25 mg one tablet -Monday, Wednesday, and Friday-Breakfast Eliquis 5 mg twice daily-Breakfast, Bedtime  Rosuvastatin 20 mg daily - Bedtime  Metoprolol Succinate 25 mg ER daily-Breakfast  Gabapentin 600 mg one tab twice a day- Breakfast, Bedtime  Folic Acid 1 mg; one tab with Breakfast Meclizine 25 mg as needed vial   Patient declined the following medications: Omeprazole 40 mg tab (Picked up from [facility] pharmacy) Sucralfate 1 gm tab: (Picked up from [facility]) Methotrexate 25 mg inj: (Has enough supply) Tb Syringes 27x1/2" G: (Has enough supply)  Patient does not needs refills at this time.  Confirmed delivery date of 07-18-2020, advised patient that pharmacy will contact them the morning of delivery.  Follow-Up:  Coordination of Enhanced Pharmacy Services and Pharmacist Review   Corwin Levins, [facility] Clinical Pharmacist Assistant [phone]  Note that patient was switched from atorvastatin to rosuvastatin.  It was clarified that patient should only take rosuvastatin daily.  Note that patient received acute fills of medications prescribed today from [facility].   17 minutes spent in review, coordination, and documentation.   Reviewed by: Katrinka Blazing, PharmD, BCACP Clinical Pharmacist [facility] [phone]

## 2020-07-17 ENCOUNTER — Telehealth: Payer: Self-pay | Admitting: Pharmacist

## 2020-07-17 LAB — H. PYLORI BREATH TEST: H. pylori Breath Test: NOT DETECTED

## 2020-07-17 NOTE — Progress Notes (Addendum)
Reviewing the patient's chart for medication adherence information specific to Losartan Pot 50 mg tab. Per the patient's pharmacy profile, she is currently adherent with this medication using [facility] for any prescription needs. Her last fill date was 07-16-2020.  Corwin Levins, CMA Clinical Pharmacist Assistant [phone]  Reviewed by: Katrinka Blazing, PharmD, BCACP Clinical Pharmacist [facility] [phone]

## 2020-07-23 ENCOUNTER — Telehealth: Payer: Self-pay | Admitting: Pharmacist

## 2020-07-23 NOTE — Progress Notes (Addendum)
Patient called me while I was out of the office and left a message asking that I call her back. Contacted the patient to review her concerns. She had a question about why her Metoprolol was in a package by itself on some days. While reviewing the patient's pharmacy profile, saw the patient is taking HCTZ on Monday, Wednesday, and Fridays. Advised that medication may not have been able to fit in the package since there is a 5 pill limit per package based upon the size of the medications. Explained this to the patient. She expressed understanding.  Corwin Levins, CMA Clinical Pharmacist Assistant [phone]  Reviewed by: Katrinka Blazing, PharmD, BCACP Clinical Pharmacist [facility] [phone]

## 2020-08-10 ENCOUNTER — Telehealth: Payer: Self-pay | Admitting: Pharmacist

## 2020-08-10 NOTE — Progress Notes (Addendum)
Chronic Care Management Pharmacy Assistant   Name: NICHOLLE FALZON  MRN: [id number] DOB: [birth year]  Reason for Encounter: Medication Review   PCP : Darreld Mclean, MD  Allergies:   Allergies  Allergen Reactions   Codeine Nausea And Vomiting    Medications: Outpatient Encounter Medications as of 08/10/2020  Medication Sig   acetaminophen (TYLENOL) 325 MG tablet Take 2 tablets (650 mg total) by mouth every 4 (four) hours as needed for mild pain (or temp > 37.5 C (99.5 F)).   ELIQUIS 5 MG TABS tablet TAKE ONE TABLET BY MOUTH AT BREAKFAST AND AT BEDTIME   folic acid (FOLVITE) 1 MG tablet Take 1 mg by mouth daily.   gabapentin (NEURONTIN) 600 MG tablet TAKE ONE TABLET BY MOUTH EVERY MORNING and TAKE ONE TABLET BY MOUTH EVERYDAY AT BEDTIME   hydrochlorothiazide (HYDRODIURIL) 25 MG tablet Take 1 tablet (25 mg total) by mouth every other day.   hydroxychloroquine (PLAQUENIL) 200 MG tablet Take 200 mg by mouth 2 (two) times daily.   losartan (COZAAR) 50 MG tablet Take 1 tablet (50 mg total) by mouth daily.   meclizine (ANTIVERT) 25 MG tablet TAKE ONE TABLET BY MOUTH twice daily AS NEEDED FOR dizziness   methocarbamol (ROBAXIN) 500 MG tablet Take 1 tablet (500 mg total) by mouth every 8 (eight) hours as needed for muscle spasms.   metoprolol succinate (TOPROL-XL) 25 MG 24 hr tablet Take 1 tablet (25 mg total) by mouth every morning.   naproxen sodium (ANAPROX) 550 MG tablet TAKE 1 TABLET BY MOUTH TWICE DAILY WITH MEALS AS NEEDED FOR PAIN- use sparingly   omeprazole (PRILOSEC) 40 MG capsule Take 1 capsule (40 mg total) by mouth daily.   ORENCIA CLICKJECT 629 MG/ML SOAJ Inject 1 Syringe into the skin once a week.   rosuvastatin (CRESTOR) 20 MG tablet Take 1 tablet (20 mg total) by mouth daily.   sucralfate (CARAFATE) 1 g tablet Take 1 tablet (1 g total) by mouth 4 (four) times daily -  with meals and at bedtime.   No facility-administered encounter medications on file as of  08/10/2020.    Current Diagnosis: Patient Active Problem List   Diagnosis Date Noted   Systemic lupus erythematosus ([place]) 08/04/2019   Acute ischemic stroke ([place]) 08/14/2018   HTN (hypertension) 08/14/2018   Pre-diabetes 04/03/2016   History of CVA (cerebrovascular accident) 03/12/2016   Chest pain at rest 03/12/2016   Peripheral edema 03/12/2016    Goals Addressed   None    Reviewed chart for medication changes ahead of medication coordination call.  No OVs, Consults, or hospital visits since last care coordination call/Pharmacist visit.   No medication changes indicated  BP Readings from Last 3 Encounters:  07/16/20 118/88  03/14/20 120/78  02/23/20 118/68    Lab Results  Component Value Date   HGBA1C 6.3 02/23/2020     Patient obtains medications through Adherence Packaging  90 Days   Last adherence delivery included:  Losartan Potassium 50 mg daily-Breakfast  Hydroxychloroquine 200 mg twice daily-Breakfast, Bedtime  HCTZ 25 mg one tablet -Monday, Wednesday, and Friday-Breakfast Eliquis 5 mg twice daily-Breakfast, Bedtime  Rosuvastatin 20 mg daily - Bedtime  Metoprolol Succinate 25 mg ER daily-Breakfast  Gabapentin 600 mg one tab twice a day- Breakfast, Bedtime  Folic Acid 1 mg; one tab with Breakfast Meclizine 25 mg as needed vial  Patient declined the following medications last month: Omeprazole 40 mg tab (Picked up from [facility]) Sucralfate 1 gm  tab: (Picked up from [facility]) Methotrexate 25 mg inj: (Has enough supply) Tb Syringes 27x1/2" G: (Has enough supply)  Patient is due for next adherence delivery on: 08-17-2020. Called patient and reviewed medications and coordinated delivery.  This delivery to include: Methotrexate 25 mg inj: weekly Omeprazole 40 mg tab daily (Place in Breakfast packs)   Patient declined the following medications due to receiving a 90 day supply 07-18-2020 : Losartan Potassium 50 mg daily-Breakfast   Hydroxychloroquine 200 mg twice daily-Breakfast, Bedtime  HCTZ 25 mg one tablet -Monday, Wednesday, and Friday-Breakfast Eliquis 5 mg twice daily-Breakfast, Bedtime  Rosuvastatin 20 mg daily - Bedtime  Metoprolol Succinate 25 mg ER daily-Breakfast  Gabapentin 600 mg one tab twice a day- Breakfast, Bedtime  Folic Acid 1 mg; one tab with Breakfast Meclizine 25 mg as needed vial   Patient does not needs refills at this time.  Confirmed delivery date of 08-17-2020, advised patient that pharmacy will contact them the morning of delivery. Follow-Up:  Pharmacist Review   Fanny Skates, [place] Pharmacist Assistant [phone]  4 minutes spent in review, coordination, and documentation.  Reviewed by: Beverly Milch, PharmD Clinical Pharmacist [facility] [phone]

## 2020-08-13 ENCOUNTER — Telehealth: Payer: Self-pay

## 2020-08-13 DIAGNOSIS — R1013 Epigastric pain: Secondary | ICD-10-CM

## 2020-08-13 MED ORDER — OMEPRAZOLE 40 MG PO CPDR: 40.0000 mg | DELAYED_RELEASE_CAPSULE | Freq: Every day | ORAL | 3 refills | Status: DC

## 2020-08-13 NOTE — Telephone Encounter (Signed)
-----   Message from Lafonda Mosses sent at 08/13/2020  2:28 PM EST ----- Regarding: Medication Request Good afternoon. I hope that you are well and staying safe. Please send the following medication to [facility] for the patient mentioned above. We are coordinating a delivery for her this week. omeprazole (PRILOSEC) 40 MG capsule  Thank you! Fanny Skates, [place] Pharmacist Assistant [phone]

## 2020-08-20 ENCOUNTER — Telehealth: Payer: Self-pay | Admitting: Pharmacist

## 2020-08-20 NOTE — Progress Notes (Addendum)
    Chronic Care Management Pharmacy Assistant   Name: Amanda Saunders  MRN: [id number] DOB: October 28, 1955  Reason for Encounter: Adherence Review  PCP : Darreld Mclean, MD  Verified Adherence Gap Information. Per insurance data, the patient is 100% compliant with the CHOL medication. The patient is 70-79% complaint with the HTN medication.The patient has met their annual wellness and wellness bundle screening. Their most recent A1C 6.3 on 02/23/20.Total gaps-all measures is equal to 1.  Follow-Up:  Pharmacist Review   Charlann Lange, [place] Pharmacist Assistant [phone]  2 minutes spent in review, coordination, and documentation.  Reviewed by: Beverly Milch, PharmD Clinical Pharmacist [facility] [phone]

## 2020-09-03 DIAGNOSIS — M0609 Rheumatoid arthritis without rheumatoid factor, multiple sites: Secondary | ICD-10-CM | POA: Diagnosis not present

## 2020-09-03 DIAGNOSIS — M7989 Other specified soft tissue disorders: Secondary | ICD-10-CM | POA: Diagnosis not present

## 2020-09-03 DIAGNOSIS — M329 Systemic lupus erythematosus, unspecified: Secondary | ICD-10-CM | POA: Diagnosis not present

## 2020-09-03 DIAGNOSIS — R945 Abnormal results of liver function studies: Secondary | ICD-10-CM | POA: Diagnosis not present

## 2020-09-11 ENCOUNTER — Telehealth: Payer: Self-pay | Admitting: Pharmacist

## 2020-09-11 NOTE — Progress Notes (Addendum)
Chronic Care Management Pharmacy Assistant   Name: Amanda Saunders  MRN: [id number] DOB: [birth year]  Reason for Encounter: Medication Review  PCP : Darreld Mclean, MD  Allergies:   Allergies  Allergen Reactions   Codeine Nausea And Vomiting    Medications: Outpatient Encounter Medications as of 09/11/2020  Medication Sig   acetaminophen (TYLENOL) 325 MG tablet Take 2 tablets (650 mg total) by mouth every 4 (four) hours as needed for mild pain (or temp > 37.5 C (99.5 F)).   ELIQUIS 5 MG TABS tablet TAKE ONE TABLET BY MOUTH AT BREAKFAST AND AT BEDTIME   folic acid (FOLVITE) 1 MG tablet Take 1 mg by mouth daily.   gabapentin (NEURONTIN) 600 MG tablet TAKE ONE TABLET BY MOUTH EVERY MORNING and TAKE ONE TABLET BY MOUTH EVERYDAY AT BEDTIME   hydrochlorothiazide (HYDRODIURIL) 25 MG tablet Take 1 tablet (25 mg total) by mouth every other day.   hydroxychloroquine (PLAQUENIL) 200 MG tablet Take 200 mg by mouth 2 (two) times daily.   losartan (COZAAR) 50 MG tablet Take 1 tablet (50 mg total) by mouth daily.   meclizine (ANTIVERT) 25 MG tablet TAKE ONE TABLET BY MOUTH twice daily AS NEEDED FOR dizziness   methocarbamol (ROBAXIN) 500 MG tablet Take 1 tablet (500 mg total) by mouth every 8 (eight) hours as needed for muscle spasms.   metoprolol succinate (TOPROL-XL) 25 MG 24 hr tablet Take 1 tablet (25 mg total) by mouth every morning.   naproxen sodium (ANAPROX) 550 MG tablet TAKE 1 TABLET BY MOUTH TWICE DAILY WITH MEALS AS NEEDED FOR PAIN- use sparingly   omeprazole (PRILOSEC) 40 MG capsule Take 1 capsule (40 mg total) by mouth daily.   ORENCIA CLICKJECT 865 MG/ML SOAJ Inject 1 Syringe into the skin once a week.   rosuvastatin (CRESTOR) 20 MG tablet Take 1 tablet (20 mg total) by mouth daily.   sucralfate (CARAFATE) 1 g tablet Take 1 tablet (1 g total) by mouth 4 (four) times daily -  with meals and at bedtime.   No facility-administered encounter medications on file as of 09/11/2020.     Current Diagnosis: Patient Active Problem List   Diagnosis Date Noted   Systemic lupus erythematosus ([place]) 08/04/2019   Acute ischemic stroke ([place]) 08/14/2018   HTN (hypertension) 08/14/2018   Pre-diabetes 04/03/2016   History of CVA (cerebrovascular accident) 03/12/2016   Chest pain at rest 03/12/2016   Peripheral edema 03/12/2016    Goals Addressed   None    Reviewed chart for medication changes ahead of medication coordination call.  No OVs, Consults, or hospital visits since last adherence review.  No medication changes indicated.  BP Readings from Last 3 Encounters:  07/16/20 118/88  03/14/20 120/78  02/23/20 118/68    Lab Results  Component Value Date   HGBA1C 6.3 02/23/2020     Patient obtains medications through Adherence Packaging  90 Days   Last adherence delivery included:  Methotrexate 25 mg inj: weekly Omeprazole 40 mg tab daily (Place in Breakfast packs)  Patient declined meds last month: (90 Ds on 07/18/20) Losartan Potassium 50 mg daily-Breakfast  Hydroxychloroquine 200 mg twice daily-Breakfast, Bedtime  HCTZ 25 mg one tablet -Monday, Wednesday, and Friday-Breakfast Eliquis 5 mg twice daily-Breakfast, Bedtime  Rosuvastatin 20 mg daily - Bedtime  Metoprolol Succinate 25 mg ER daily-Breakfast  Gabapentin 600 mg one tab twice a day- Breakfast, Bedtime  Folic Acid 1 mg; one tab with Breakfast Meclizine 25 mg as needed vial  Patient is not due for a adherence delivery at this time.  Called patient and reviewed medications and coordinated delivery.  This delivery to include: (ACUTE Fill 09/24/20) 1 ml Tb Syrng Mis 27gx1/2  Patient declined the following medications: Losartan Potassium 50 mg daily-Breakfast  Hydroxychloroquine 200 mg twice daily-Breakfast, Bedtime  HCTZ 25 mg one tablet -Monday, Wednesday, and Friday-Breakfast Eliquis 5 mg twice daily-Breakfast, Bedtime  Rosuvastatin 20 mg daily - Bedtime  Metoprolol Succinate 25 mg ER  daily-Breakfast  Gabapentin 600 mg one tab twice a day- Breakfast, Bedtime  Folic Acid 1 mg; one tab with Breakfast Meclizine 25 mg as needed vial Methotrexate 25 mg inj: weekly Omeprazole 40 mg tab daily (Place in Breakfast packs)  Patient does not need any refills at this time.  Follow-Up:  Coordination of Enhanced Pharmacy Services and Pharmacist Review   Charlann Lange, [place] Pharmacist Assistant [phone]  10 minutes spent in review, coordination, and documentation.  Reviewed by: Beverly Milch, PharmD Clinical Pharmacist [facility] [phone]

## 2020-09-14 ENCOUNTER — Ambulatory Visit: Payer: Medicare Other | Admitting: Cardiology

## 2020-09-21 ENCOUNTER — Telehealth: Payer: Self-pay | Admitting: Pharmacist

## 2020-09-21 NOTE — Progress Notes (Addendum)
° ° °  Chronic Care Management Pharmacy Assistant   Name: Amanda Saunders  MRN: [id number] DOB: 08-24-1955  Reason for Encounter: Medication Review   Conditions to be addressed/monitored: Their chronic conditions include: PreDM, HTN, Hx of CVA, Noncompaction Cardiomyopathy, Lupus, Gout, Pain.  Patient reached out to [facility] about when her medication Prednisone 10 mg was going to be delivered and if it was going to be delivered by the end of the day. Reached out the pharmacy and checked [facility]. Patients medication has been processed and is awaiting pick up from a carrier to be delivered to her by the end of the day, so the patient can have her medication for the weekend. Called patient to inform her of this information, no answer so I left a voicemail.    Follow Up: Pharmacist Review  Charlann Lange, RMA Clinical Pharmacist Assistant [phone]  5 minutes spent in review, coordination, and documentation.  Reviewed by: Beverly Milch, PharmD Clinical Pharmacist [facility] [phone]

## 2020-10-01 ENCOUNTER — Ambulatory Visit: Payer: Medicare Other | Admitting: Neurology

## 2020-10-04 ENCOUNTER — Other Ambulatory Visit: Payer: Self-pay | Admitting: Neurology

## 2020-10-10 ENCOUNTER — Telehealth: Payer: Self-pay | Admitting: Pharmacist

## 2020-10-10 ENCOUNTER — Telehealth: Payer: Self-pay | Admitting: Family Medicine

## 2020-10-10 DIAGNOSIS — I428 Other cardiomyopathies: Secondary | ICD-10-CM

## 2020-10-10 DIAGNOSIS — R609 Edema, unspecified: Secondary | ICD-10-CM

## 2020-10-10 DIAGNOSIS — R072 Precordial pain: Secondary | ICD-10-CM

## 2020-10-10 DIAGNOSIS — R42 Dizziness and giddiness: Secondary | ICD-10-CM

## 2020-10-10 DIAGNOSIS — I10 Essential (primary) hypertension: Secondary | ICD-10-CM

## 2020-10-10 DIAGNOSIS — R1013 Epigastric pain: Secondary | ICD-10-CM

## 2020-10-10 MED ORDER — OMEPRAZOLE 40 MG PO CPDR: 40.0000 mg | DELAYED_RELEASE_CAPSULE | Freq: Every day | ORAL | 3 refills | Status: DC

## 2020-10-10 MED ORDER — MECLIZINE HCL 25 MG PO TABS: ORAL_TABLET | ORAL | 1 refills | Status: DC

## 2020-10-10 MED ORDER — METOPROLOL SUCCINATE ER 25 MG PO TB24: 25.0000 mg | ORAL_TABLET | Freq: Every morning | ORAL | 0 refills | Status: DC

## 2020-10-10 MED ORDER — LOSARTAN POTASSIUM 50 MG PO TABS: 50.0000 mg | ORAL_TABLET | Freq: Every day | ORAL | 3 refills | Status: DC

## 2020-10-10 MED ORDER — HYDROCHLOROTHIAZIDE 25 MG PO TABS: 25.0000 mg | ORAL_TABLET | ORAL | 3 refills | Status: DC

## 2020-10-10 MED ORDER — SUCRALFATE 1 G PO TABS: 1.0000 g | ORAL_TABLET | Freq: Three times a day (TID) | ORAL | 0 refills | Status: DC

## 2020-10-10 MED ORDER — ROSUVASTATIN CALCIUM 20 MG PO TABS: 20.0000 mg | ORAL_TABLET | Freq: Every day | ORAL | 3 refills | Status: DC

## 2020-10-10 NOTE — Telephone Encounter (Signed)
Patient states she need refills on all her medication   [place], [place] - [address] Phone:  [phone]  Fax:  [phone]

## 2020-10-10 NOTE — Progress Notes (Addendum)
Chronic Care Management Pharmacy Assistant   Name: ELOWYN RAUPP  MRN: [id number] DOB: 12-14-1955  Reason for Encounter: Medication Review   Medications: Outpatient Encounter Medications as of 10/10/2020  Medication Sig   acetaminophen (TYLENOL) 325 MG tablet Take 2 tablets (650 mg total) by mouth every 4 (four) hours as needed for mild pain (or temp > 37.5 C (99.5 F)).   ELIQUIS 5 MG TABS tablet TAKE ONE TABLET BY MOUTH AT BREAKFAST AND AT BEDTIME   folic acid (FOLVITE) 1 MG tablet Take 1 mg by mouth daily.   gabapentin (NEURONTIN) 600 MG tablet TAKE ONE TABLET BY MOUTH EVERY MORNING and TAKE ONE TABLET BY MOUTH EVERYDAY AT BEDTIME   hydrochlorothiazide (HYDRODIURIL) 25 MG tablet Take 1 tablet (25 mg total) by mouth every other day.   hydroxychloroquine (PLAQUENIL) 200 MG tablet Take 200 mg by mouth 2 (two) times daily.   losartan (COZAAR) 50 MG tablet Take 1 tablet (50 mg total) by mouth daily.   meclizine (ANTIVERT) 25 MG tablet TAKE ONE TABLET BY MOUTH twice daily AS NEEDED FOR dizziness   methocarbamol (ROBAXIN) 500 MG tablet Take 1 tablet (500 mg total) by mouth every 8 (eight) hours as needed for muscle spasms.   metoprolol succinate (TOPROL-XL) 25 MG 24 hr tablet Take 1 tablet (25 mg total) by mouth every morning.   naproxen sodium (ANAPROX) 550 MG tablet TAKE 1 TABLET BY MOUTH TWICE DAILY WITH MEALS AS NEEDED FOR PAIN- use sparingly   omeprazole (PRILOSEC) 40 MG capsule Take 1 capsule (40 mg total) by mouth daily.   ORENCIA CLICKJECT 734 MG/ML SOAJ Inject 1 Syringe into the skin once a week.   rosuvastatin (CRESTOR) 20 MG tablet Take 1 tablet (20 mg total) by mouth daily.   sucralfate (CARAFATE) 1 g tablet Take 1 tablet (1 g total) by mouth 4 (four) times daily -  with meals and at bedtime.   No facility-administered encounter medications on file as of 10/10/2020.   Reviewed chart for medication changes ahead of medication coordination call.  No OVs, Consults, or  hospital visits since last care coordination call.  No medication changes indicated.  BP Readings from Last 3 Encounters:  07/16/20 118/88  03/14/20 120/78  02/23/20 118/68    Lab Results  Component Value Date   HGBA1C 6.3 02/23/2020     Patient obtains medications through Adherence Packaging  90 Days   Last adherence delivery included: (09/24/20) 1 ml Tb Syrng Mis 27gx1/2  Patient declined meds last month: Losartan Potassium 50 mg daily-Breakfast  Hydroxychloroquine 200 mg twice daily-Breakfast, Bedtime  HCTZ 25 mg one tablet -Monday, Wednesday, and Friday-Breakfast Eliquis 5 mg twice daily-Breakfast, Bedtime  Rosuvastatin 20 mg daily - Bedtime  Metoprolol Succinate 25 mg ER daily-Breakfast  Gabapentin 600 mg one tab twice a day- Breakfast, Bedtime  Folic Acid 1 mg; one tab with Breakfast Meclizine 25 mg as needed vial Methotrexate 25 mg inj: weekly Omeprazole 40 mg tab daily (Place in Breakfast packs)  Patients adherence deliveries will restart in May 2022, Per patient.  Called patient and reviewed medications.  This delivery to include: None  Patient declined the following medications: (Patient is out of town till may, she will be getting her medications through a local pharmacy in [place] until May 2022) Losartan Potassium 50 mg daily-Breakfast  Hydroxychloroquine 200 mg twice daily-Breakfast, Bedtime  HCTZ 25 mg one tablet -Monday, Wednesday, and Friday-Breakfast Eliquis 5 mg twice daily-Breakfast, Bedtime  Rosuvastatin 20 mg daily -  Bedtime  Metoprolol Succinate 25 mg ER daily-Breakfast  Gabapentin 600 mg one tab twice a day- Breakfast, Bedtime  Folic Acid 1 mg; one tab with Breakfast Meclizine 25 mg as needed vial Methotrexate 25 mg inj: weekly Omeprazole 40 mg tab daily (Place in Breakfast packs) 1 ml Tb Syrng Mis 27gx1/2( enough on hand)  Patient does not need any refills sent to [facility] at this time.   Follow-Up: Pharmacist Review  Charlann Lange,  RMA Clinical Pharmacist Assistant [phone]  10 minutes spent in review, coordination, and documentation.  Reviewed by: Beverly Milch, PharmD Clinical Pharmacist [facility] [phone]

## 2020-10-10 NOTE — Telephone Encounter (Signed)
Medication refilled

## 2020-10-18 ENCOUNTER — Ambulatory Visit: Payer: Medicare Other | Admitting: Neurology

## 2020-11-08 ENCOUNTER — Other Ambulatory Visit: Payer: Self-pay | Admitting: Family Medicine

## 2020-11-08 DIAGNOSIS — R1013 Epigastric pain: Secondary | ICD-10-CM

## 2020-11-12 ENCOUNTER — Ambulatory Visit: Payer: Medicare Other | Admitting: Cardiology

## 2020-11-12 ENCOUNTER — Telehealth: Payer: Self-pay | Admitting: Pharmacist

## 2020-11-12 NOTE — Progress Notes (Addendum)
Chronic Care Management Pharmacy Assistant   Name: Amanda Saunders  MRN: [id number] DOB: 1955-11-18  Reason for Encounter: Medication Review/Medication Coordination Call.   Medications: Outpatient Encounter Medications as of 11/12/2020  Medication Sig   acetaminophen (TYLENOL) 325 MG tablet Take 2 tablets (650 mg total) by mouth every 4 (four) hours as needed for mild pain (or temp > 37.5 C (99.5 F)).   ELIQUIS 5 MG TABS tablet TAKE ONE TABLET BY MOUTH AT BREAKFAST AND AT BEDTIME   folic acid (FOLVITE) 1 MG tablet Take 1 mg by mouth daily.   gabapentin (NEURONTIN) 600 MG tablet TAKE ONE TABLET BY MOUTH EVERY MORNING and TAKE ONE TABLET BY MOUTH EVERYDAY AT BEDTIME   hydrochlorothiazide (HYDRODIURIL) 25 MG tablet Take 1 tablet (25 mg total) by mouth every other day.   hydroxychloroquine (PLAQUENIL) 200 MG tablet Take 200 mg by mouth 2 (two) times daily.   losartan (COZAAR) 50 MG tablet Take 1 tablet (50 mg total) by mouth daily.   meclizine (ANTIVERT) 25 MG tablet TAKE ONE TABLET BY MOUTH twice daily AS NEEDED FOR dizziness   methocarbamol (ROBAXIN) 500 MG tablet Take 1 tablet (500 mg total) by mouth every 8 (eight) hours as needed for muscle spasms.   metoprolol succinate (TOPROL-XL) 25 MG 24 hr tablet Take 1 tablet (25 mg total) by mouth every morning.   naproxen sodium (ANAPROX) 550 MG tablet TAKE 1 TABLET BY MOUTH TWICE DAILY WITH MEALS AS NEEDED FOR PAIN- use sparingly   omeprazole (PRILOSEC) 40 MG capsule Take 1 capsule (40 mg total) by mouth daily.   ORENCIA CLICKJECT 924 MG/ML SOAJ Inject 1 Syringe into the skin once a week.   rosuvastatin (CRESTOR) 20 MG tablet Take 1 tablet (20 mg total) by mouth daily.   sucralfate (CARAFATE) 1 g tablet TAKE 1 TABLET BY MOUTH 4 TIMES DAILY WITH MEALS AND AT BEDTIME   No facility-administered encounter medications on file as of 11/12/2020.    Reviewed chart for medication changes ahead of medication coordination call.  No OVs, Consults,  or hospital visits since last care coordination call.  No medication changes indicated.  BP Readings from Last 3 Encounters:  07/16/20 118/88  03/14/20 120/78  02/23/20 118/68    Lab Results  Component Value Date   HGBA1C 6.3 02/23/2020     Patient obtains medications through Adherence Packaging  90 Days   Last adherence delivery included: (09/24/20) 1 ml Tb Syrng Mis 27gx1/2  Patient declined meds last month: Losartan Potassium 50 mg daily-Breakfast  Hydroxychloroquine 200 mg twice daily-Breakfast, Bedtime  HCTZ 25 mg one tablet -Monday, Wednesday, and Friday-Breakfast Eliquis 5 mg twice daily-Breakfast, Bedtime  Rosuvastatin 20 mg daily - Bedtime  Metoprolol Succinate 25 mg ER daily-Breakfast  Gabapentin 600 mg one tab twice a day- Breakfast, Bedtime  Folic Acid 1 mg; one tab with Breakfast Meclizine 25 mg as needed vial Methotrexate 25 mg inj: weekly Omeprazole 40 mg tab daily (Place in Breakfast packs) 1 ml Tb Syrng Mis 27gx1/2( enough on hand)  Patient is not due for an adherence delivery at this.  Called patient and reviewed medications and coordinated delivery.  This delivery to include: None  Patient declined the following medications: Losartan Potassium 50 mg daily-Breakfast  Hydroxychloroquine 200 mg twice daily-Breakfast, Bedtime  HCTZ 25 mg one tablet -Monday, Wednesday, and Friday-Breakfast Eliquis 5 mg twice daily-Breakfast, Bedtime  Rosuvastatin 20 mg daily - Bedtime  Metoprolol Succinate 25 mg ER daily-Breakfast  Gabapentin 600 mg one  tab twice a day- Breakfast, Bedtime  Folic Acid 1 mg; one tab with Breakfast Meclizine 25 mg as needed vial Methotrexate 25 mg inj: weekly Omeprazole 40 mg tab daily (Place in Breakfast packs) 1 ml Tb Syrng Mis 27gx1/2( enough on hand)  Patient don't need refills at this time.   (Patient is out of town till may, she will be getting her medications through a local pharmacy in [place] until May  2022)  [place], RMA Clinical Pharmacist Assistant [phone]  7 minutes spent in review, coordination, and documentation.  Reviewed by: Beverly Milch, PharmD Clinical Pharmacist [facility] [phone]

## 2020-11-17 NOTE — Progress Notes (Signed)
Cardiology Office Note:    Date:  11/23/2020   ID:  Amanda Saunders, DOB [birth year], MRN [id number]  PCP:  Darreld Mclean, MD  Cardiologist:  Amanda Ruths, MD  Electrophysiologist:  None   Referring MD: Darreld Mclean, MD   Chief Complaint: follow-up of noncompaction cardiomyopathy   History of Present Illness:    Amanda Saunders is a 65 y.o. 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 Stanford Saunders and presents today for routine follow-up of non-compaction cardiomyopathy.   Patient was referred to Dr. Stanford Saunders in 03/2016 for evaluation of chest pain and dyspnea. ETT and Echo were ordered for further evaluation. ETT was non-diagnostic. Echo showed LVEF of 40-45% with diffuse hypokinesis, grade 1 diastolic dysfunction, mild MR, and mild TR. Also showed possible signs of noncompaction at the LV apex. Nuclear stress test and cardiac MRI were then ordered. Myoview showed in 05/2016 showed no evidence of prior infarct or ischemia. Cardiac MRI in 05/2016 showed prominent mid to apical LV trabeculation concerning for LV noncompaction but no myocardial LGE to suggest prior MI, myocarditis, or infiltrative disease. She had CVA in 07/2018. Carotid dopplers at that time showed mild stenosis (1-39%) of bilateral ICAs. Repeat cardiac MRI in 02/2019 showed mild LV enlargement with diffuse hypokinesis, EF 44%, with evidence of ventricular noncompaction. Most recent Echo in 02/2020 showed LVEF of 40-45% with normal wall motion. No significant valvular disease. She was last seen by Dr. Stanford Saunders for a virtual visit in 02/2020 at which time she continued to have dyspnea on exertion which was stable as well as mild pedal edema well controlled on diuretics. She also continued to note occasional brief sharp episodes of chest pain but no exertional chest pain concerning for angina. No additional  work-up was felt to be needed.   Patient presents today for follow-up. Patient has multiple complaints today including shortness of breath, headaches, hand pain, and leg pain. In regards to her shortness of breath, she notes worsening dyspnea on exertion lately. She has been staying with her sister the last couple of months helping her and notes shortness of breath when walking up the steps. She has stable 3 pillow orthopnea which she has had for years. She reports occasionally waking up feeling short of breath but it sounds more like possible sleep apnea than PND. She has chronic lower extremity edema for which she takes HCTZ every other day. This is stable. She still has occasional brief sharp episodes of chest pain that only last a couple of seconds and then go away. This occurs at rest not with exertional. This is unchanged from previous reports of this. She notes occasional tachycardia if she is exerting herself but no concerning palpitations. She does note random episodes of dizziness with ambulating with associated sweating that occurs. She states this happens when she is shopping in [place] and she has to stop and rest and then dizziness resolves. No palpitations at the time. No chest pain. She states this feels similar to her prior stroke symptoms. She also reports daily headache and some vision changes (not necessarily related to her headaches). She does have a history of migraines. Advised patient to follow-up with Neurologist and PCP. She also notes some tingling of her hands which is chronic and she thinks is related to her lupus/rheumatologic disease as well as occasional leg cramping at rest. She has good distal pulses and  this does not sound like claudication.  Past Medical History:  Diagnosis Date  . Cardiomyopathy ([place])   . Hypertension   . Lupus ([place])   . PUD (peptic ulcer disease)   . Stroke ([place]) 08/13/2018  . Vertigo     Past Surgical History:  Procedure Laterality Date  . BACK  SURGERY    . CARPAL TUNNEL RELEASE    . NECK SURGERY     2015    Current Medications: Current Meds  Medication Sig  . acetaminophen (TYLENOL) 325 MG tablet Take 2 tablets (650 mg total) by mouth every 4 (four) hours as needed for mild pain (or temp > 37.5 C (99.5 F)).  Marland Kitchen ELIQUIS 5 MG TABS tablet TAKE ONE TABLET BY MOUTH AT BREAKFAST AND AT BEDTIME  . folic acid (FOLVITE) 1 MG tablet Take 1 mg by mouth daily.  Marland Kitchen gabapentin (NEURONTIN) 600 MG tablet TAKE ONE TABLET BY MOUTH EVERY MORNING and TAKE ONE TABLET BY MOUTH EVERYDAY AT BEDTIME  . hydroxychloroquine (PLAQUENIL) 200 MG tablet Take 200 mg by mouth 2 (two) times daily.  Marland Kitchen losartan (COZAAR) 50 MG tablet Take 1 tablet (50 mg total) by mouth daily.  . meclizine (ANTIVERT) 25 MG tablet TAKE ONE TABLET BY MOUTH twice daily AS NEEDED FOR dizziness  . methocarbamol (ROBAXIN) 500 MG tablet Take 1 tablet (500 mg total) by mouth every 8 (eight) hours as needed for muscle spasms.  . metoprolol succinate (TOPROL-XL) 25 MG 24 hr tablet Take 1 tablet (25 mg total) by mouth every morning.  Marland Kitchen omeprazole (PRILOSEC) 40 MG capsule Take 1 capsule (40 mg total) by mouth daily.  Marland Kitchen ORENCIA CLICKJECT 0000000 MG/ML SOAJ Inject 1 Syringe into the skin once a week.  . rosuvastatin (CRESTOR) 20 MG tablet Take 1 tablet (20 mg total) by mouth daily.  . sucralfate (CARAFATE) 1 g tablet TAKE 1 TABLET BY MOUTH 4 TIMES DAILY WITH MEALS AND AT BEDTIME  . [DISCONTINUED] hydrochlorothiazide (HYDRODIURIL) 25 MG tablet Take 1 tablet (25 mg total) by mouth every other day.     Allergies:   Codeine   Social History   Socioeconomic History  . Marital status: Single    Spouse name: Not on file  . Number of children: 1  . Years of education: 54  . Highest education level: Not on file  Occupational History  . Occupation: retired    Comment: retired  Tobacco Use  . Smoking status: Former Smoker    Quit date: 07/21/2004    Years since quitting: 16.3  . Smokeless tobacco:  Never Used  Vaping Use  . Vaping Use: Never used  Substance and Sexual Activity  . Alcohol use: Yes    Comment: occasional  . Drug use: No  . Sexual activity: Not on file  Other Topics Concern  . Not on file  Social History Narrative   Lives one level with son/grandchildren; Left handed; high school grad; retired; walks for exercise; no caffeine   Social Determinants of Health   Financial Resource Strain: Low Risk   . Difficulty of Paying Living Expenses: Not very hard  Food Insecurity: No Food Insecurity  . Worried About Charity fundraiser in the Last Year: Never true  . Ran Out of Food in the Last Year: Never true  Transportation Needs: No Transportation Needs  . Lack of Transportation (Medical): No  . Lack of Transportation (Non-Medical): No  Physical Activity: Not on file  Stress: Not on file  Social Connections: Not  on file     Family History: The patient's family history includes Arthritis in her mother; Heart failure in her mother; Hypertension in her father and mother; Kidney disease in her father; Stroke in her mother.  ROS:   Please see the history of present illness.     EKGs/Labs/Other Studies Reviewed:    The following studies were reviewed today:  Myoview 06/19/2016:  There was no ST segment deviation noted during stress.  The study is normal.  This is a low risk study.  The left ventricular ejection fraction is mildly decreased (45-54%).   Normal pharmacologic nuclear stress test with no evidence of prior infarct or ischemia.  Mildly impaired LVEF consistent with non-ischemic cardiomyopathy.  _______________  Cardiac MRI 03/02/2019: Impressions: 1.  Mild LVE with diffuse hypokinesis EF 44% 2.   Evidence for ventricular non compaction 3.  Mild LAE 4.  Mild MR 5.  Mild AR 6.  No delayed hyper-enhancement post gadolinium _______________  Echocardiogram 02/22/2020: Impressions: 1. Left ventricular ejection fraction, by estimation, is 40 to 45%.  The  left ventricle has mildly decreased function. The left ventricle has no  regional wall motion abnormalities. Left ventricular diastolic parameters  are indeterminate.  2. Right ventricular systolic function is normal. The right ventricular  size is normal. There is normal pulmonary artery systolic pressure.  3. The mitral valve is grossly normal. Trivial mitral valve  regurgitation. No evidence of mitral stenosis.  4. The aortic valve is normal in structure. Aortic valve regurgitation is  not visualized. No aortic stenosis is present.  EKG:  EKG not ordered today.   Recent Labs: 07/16/2020: ALT 40; BUN 21; Creatinine, Ser 0.93; Hemoglobin 13.3; Platelets 222.0; Potassium 4.7; Sodium 142  Recent Lipid Panel    Component Value Date/Time   CHOL 108 06/08/2020 0925   TRIG 76.0 06/08/2020 0925   HDL 38.80 (L) 06/08/2020 0925   CHOLHDL 3 06/08/2020 0925   VLDL 15.2 06/08/2020 0925   LDLCALC 54 06/08/2020 0925    Physical Exam:    Vital Signs: BP (!) 142/79   Pulse 62   Ht 5\' 3"  (1.6 m)   Wt 201 lb (91.2 kg)   SpO2 99%   BMI 35.61 kg/m     Wt Readings from Last 3 Encounters:  11/23/20 201 lb (91.2 kg)  07/16/20 190 lb (86.2 kg)  04/03/20 198 lb (89.8 kg)     General: 65 y.o. obese African-American female in no acute distress. HEENT: Normocephalic and atraumatic. Sclera clear.  Neck: Supple. No carotid bruits. No JVD. Heart: RRR. Distinct S1 and S2. No murmurs, gallops, or rubs. Radial and distal pedal pulses 2+ and equal bilaterally. Lungs: No increased work of breathing. Clear to ausculation bilaterally. No wheezes, rhonchi, or rales.  Abdomen: Soft, non-distended, and non-tender to palpation.  Extremities: Trace lower extremity edema bilaterally. Skin: Warm and dry. Neuro: Alert and oriented x3. No focal deficits. Psych: Normal affect. Responds appropriately.  Assessment:    1. Dyspnea on exertion   2. Noncompaction cardiomyopathy ([place])   3. Primary  hypertension   4. Hyperlipidemia, unspecified hyperlipidemia type   5. History of CVA (cerebrovascular accident)   6. Snoring   7. Chronic nonintractable headache, unspecified headache type   8. Dizziness     Plan:    Dyspnea on Exertion Noncompaction Cardiomyopathy - She does not worsening dyspnea with activity such as walking up steps. - Most recent Echo in 02/2020 showed LVEF of 40-45%.. Prior Myoview showed no evidence of  ischemia and cardiac MRI confirmed noncompaction of LV.  - Appears euvolemic on exam.  - Continue Losartan 50mg  daily.  - Continue Toprol-XL 25mg  daily.  - Discussed importance of daily weights and sodium/fluid restrictions. Patient to notify us if she has 3lb weight gain in 1 day or 5lb weight gain in 1 week. - Will check BNP and BMET today.  - Patient has had a CVA which could have been related to intracranial vascular disease but also to noncompaction. Therefore, patient on Eliquis 5mg  twice daily. Will continue.   Hypertension - BP mildly elevated at 142/79. - Continue Losartan and Toprol-XL as above.  - Will increase HCTZ to 25mg  daily (currently taking every other day). - Will repeat BMET today and then again in 1-2 weeks after increasing HCTZ.  Hyperlipidemia - Most recent lipid panel in 05/2020 showed Total Cholesterol of 108, Triglycerides 76, HDL 38.8, LDL 54.  - Continue Crestor 20mg  daily.  - Labs followed by PCP.  History of CVA - Continue Eliquis as CVA may have been related to noncompaction.   Snoring - Patient describes multiple apneic episodes at night. She has been told she snores in the past and reports not feeling well rested in the morning.  - BMI 35.61.  - Suspect she may have sleep apnea. Will order sleep study.  Headaches Dizziness - Patient reports daily headaches as well as occasional dizziness with associated sweating over the last 6 months. She has a history of migraines. These dizzy episodes remind her of her prior stroke. -  She still follows with Neurology. Recommend following up with Neurology and her PCP. I don't think her dizziness is cardiac in nature at this point.  - Reviewed signs of stroke and emphasized the importance of going to the ED if she develops any of these.  Disposition: Follow up in 3-4 months with Dr. Stanford Saunders.   Medication Adjustments/Labs and Tests Ordered: Current medicines are reviewed at length with the patient today.  Concerns regarding medicines are outlined above.  Orders Placed This Encounter  Procedures  . Basic metabolic panel  . Pro b natriuretic peptide (BNP)  . Basic metabolic panel  . Split night study   Meds ordered this encounter  Medications  . hydrochlorothiazide (HYDRODIURIL) 25 MG tablet    Sig: Take 1 tablet (25 mg total) by mouth daily.    Dispense:  90 tablet    Refill:  3    Please consider 90 day supplies to promote better adherence    Patient Instructions  Medication Instructions:  Your physician has recommended you make the following change in your medication:   INCREASE: Hydrochlorothiazide to 25mg  daily  *If you need a refill on your cardiac medications before your next appointment, please call your pharmacy*   Lab Work: TODAY: BMET, BNP BMET in 1-2 weeks  If you have labs (blood work) drawn today and your tests are completely normal, you will receive your results only by: Marland Kitchen MyChart Message (if you have MyChart) OR . A paper copy in the mail If you have any lab test that is abnormal or we need to change your treatment, we will call you to review the results.   Testing/Procedures: Your physician has recommended that you have a sleep study. This test records several body functions during sleep, including: brain activity, eye movement, oxygen and carbon dioxide blood levels, heart rate and rhythm, breathing rate and rhythm, the flow of air through your mouth and nose, snoring, body muscle movements, and chest  and belly movement.  Follow-Up: At  [facility], you and your health needs are our priority.  As part of our continuing mission to provide you with exceptional heart care, we have created designated Provider Care Teams.  These Care Teams include your primary Cardiologist (physician) and Advanced Practice Providers (APPs -  Physician Assistants and Nurse Practitioners) who all work together to provide you with the care you need, when you need it.  We recommend signing up for the patient portal called "MyChart".  Sign up information is provided on this After Visit Summary.  MyChart is used to connect with patients for Virtual Visits (Telemedicine).  Patients are able to view lab/test results, encounter notes, upcoming appointments, etc.  Non-urgent messages can be sent to your provider as well.   To learn more about what you can do with MyChart, go to [email or web address].    Your next appointment:   3-4 month(s)  The format for your next appointment:   In Person  Provider:   You may see Amanda Ruths, MD or one of the following Advanced Practice Providers on your designated Care Team:    [place], PA-C  Coletta Memos, FNP    Other Instructions Heart Failure Education: 1. Weigh yourself EVERY morning after you go to the bathroom but before you eat or drink anything. Write this number down in a weight log/diary. If you gain 3 pounds overnight or 5 pounds in a week, call the office. 2. Take your medicines as prescribed. If you have concerns about your medications, please call us before you stop taking them.  3. Eat low salt foods--Limit salt (sodium) to 2000 mg per day. This will help prevent your body from holding onto fluid. Read food labels as many processed foods have a lot of sodium, especially canned goods and prepackaged meats. If you would like some assistance choosing low sodium foods, we would be happy to set you up with a nutritionist. 4. Limit all fluids for the day to less than 2 liters (64 ounces).  Fluid includes all drinks, coffee, juice, ice chips, soup, jello, and all other liquids. 5. Stay as active as you can everyday. Staying active will give you more energy and make your muscles stronger. Start with 5 minutes at a time and work your way up to 30 minutes a day. Break up your activities--do some in the morning and some in the afternoon. Start with 3 days per week and work your way up to 5 days as you can.  If you have chest pain, feel short of breath, dizzy, or lightheaded, STOP. If you don't feel better after a short rest, call 911. If you do feel better, call the office to let us know you have symptoms with exercise.       Signed, Darreld Mclean, PA-C  11/23/2020 9:56 AM    [facility]

## 2020-11-23 ENCOUNTER — Encounter: Payer: Self-pay | Admitting: Student

## 2020-11-23 ENCOUNTER — Ambulatory Visit (INDEPENDENT_AMBULATORY_CARE_PROVIDER_SITE_OTHER): Payer: Medicare Other | Admitting: Student

## 2020-11-23 ENCOUNTER — Other Ambulatory Visit: Payer: Self-pay

## 2020-11-23 VITALS — BP 142/79 | HR 62 | Ht 63.0 in | Wt 201.0 lb

## 2020-11-23 DIAGNOSIS — R0683 Snoring: Secondary | ICD-10-CM

## 2020-11-23 DIAGNOSIS — R42 Dizziness and giddiness: Secondary | ICD-10-CM | POA: Diagnosis not present

## 2020-11-23 DIAGNOSIS — R519 Headache, unspecified: Secondary | ICD-10-CM | POA: Diagnosis not present

## 2020-11-23 DIAGNOSIS — Z8673 Personal history of transient ischemic attack (TIA), and cerebral infarction without residual deficits: Secondary | ICD-10-CM

## 2020-11-23 DIAGNOSIS — R0609 Other forms of dyspnea: Secondary | ICD-10-CM

## 2020-11-23 DIAGNOSIS — E785 Hyperlipidemia, unspecified: Secondary | ICD-10-CM

## 2020-11-23 DIAGNOSIS — I10 Essential (primary) hypertension: Secondary | ICD-10-CM

## 2020-11-23 DIAGNOSIS — R0600 Dyspnea, unspecified: Secondary | ICD-10-CM

## 2020-11-23 DIAGNOSIS — I428 Other cardiomyopathies: Secondary | ICD-10-CM | POA: Insufficient documentation

## 2020-11-23 DIAGNOSIS — G8929 Other chronic pain: Secondary | ICD-10-CM | POA: Diagnosis not present

## 2020-11-23 MED ORDER — HYDROCHLOROTHIAZIDE 25 MG PO TABS: 25.0000 mg | ORAL_TABLET | Freq: Every day | ORAL | 3 refills | Status: DC

## 2020-11-23 NOTE — Patient Instructions (Addendum)
Medication Instructions:  Your physician has recommended you make the following change in your medication:   INCREASE: Hydrochlorothiazide to 25mg  daily  *If you need a refill on your cardiac medications before your next appointment, please call your pharmacy*   Lab Work: TODAY: BMET, BNP BMET in 1-2 weeks  If you have labs (blood work) drawn today and your tests are completely normal, you will receive your results only by: Marland Kitchen MyChart Message (if you have MyChart) OR . A paper copy in the mail If you have any lab test that is abnormal or we need to change your treatment, we will call you to review the results.   Testing/Procedures: Your physician has recommended that you have a sleep study. This test records several body functions during sleep, including: brain activity, eye movement, oxygen and carbon dioxide blood levels, heart rate and rhythm, breathing rate and rhythm, the flow of air through your mouth and nose, snoring, body muscle movements, and chest and belly movement.  Follow-Up: At [facility], you and your health needs are our priority.  As part of our continuing mission to provide you with exceptional heart care, we have created designated Provider Care Teams.  These Care Teams include your primary Cardiologist (physician) and Advanced Practice Providers (APPs -  Physician Assistants and Nurse Practitioners) who all work together to provide you with the care you need, when you need it.  We recommend signing up for the patient portal called "MyChart".  Sign up information is provided on this After Visit Summary.  MyChart is used to connect with patients for Virtual Visits (Telemedicine).  Patients are able to view lab/test results, encounter notes, upcoming appointments, etc.  Non-urgent messages can be sent to your provider as well.   To learn more about what you can do with MyChart, go to [email or web address].    Your next appointment:   3-4 month(s)  The format for  your next appointment:   In Person  Provider:   You may see Kirk Ruths, MD or one of the following Advanced Practice Providers on your designated Care Team:    [place], PA-C  Coletta Memos, FNP    Other Instructions Heart Failure Education: 1. Weigh yourself EVERY morning after you go to the bathroom but before you eat or drink anything. Write this number down in a weight log/diary. If you gain 3 pounds overnight or 5 pounds in a week, call the office. 2. Take your medicines as prescribed. If you have concerns about your medications, please call us before you stop taking them.  3. Eat low salt foods--Limit salt (sodium) to 2000 mg per day. This will help prevent your body from holding onto fluid. Read food labels as many processed foods have a lot of sodium, especially canned goods and prepackaged meats. If you would like some assistance choosing low sodium foods, we would be happy to set you up with a nutritionist. 4. Limit all fluids for the day to less than 2 liters (64 ounces). Fluid includes all drinks, coffee, juice, ice chips, soup, jello, and all other liquids. 5. Stay as active as you can everyday. Staying active will give you more energy and make your muscles stronger. Start with 5 minutes at a time and work your way up to 30 minutes a day. Break up your activities--do some in the morning and some in the afternoon. Start with 3 days per week and work your way up to 5 days as you can.  If you have  chest pain, feel short of breath, dizzy, or lightheaded, STOP. If you don't feel better after a short rest, call 911. If you do feel better, call the office to let us know you have symptoms with exercise.

## 2020-11-24 LAB — BASIC METABOLIC PANEL
BUN/Creatinine Ratio: 17 (ref 12–28)
BUN: 16 mg/dL (ref 8–27)
CO2: 24 mmol/L (ref 20–29)
Calcium: 9.1 mg/dL (ref 8.7–10.3)
Chloride: 105 mmol/L (ref 96–106)
Creatinine, Ser: 0.96 mg/dL (ref 0.57–1.00)
Glucose: 95 mg/dL (ref 65–99)
Potassium: 4.4 mmol/L (ref 3.5–5.2)
Sodium: 143 mmol/L (ref 134–144)
eGFR: 66 mL/min/{1.73_m2} (ref >59)

## 2020-11-24 LAB — PRO B NATRIURETIC PEPTIDE: NT-Pro BNP: 87 pg/mL (ref 0–287)

## 2020-11-28 ENCOUNTER — Ambulatory Visit (INDEPENDENT_AMBULATORY_CARE_PROVIDER_SITE_OTHER): Payer: Medicare Other | Admitting: Pharmacist

## 2020-11-28 ENCOUNTER — Telehealth: Payer: Self-pay | Admitting: *Deleted

## 2020-11-28 ENCOUNTER — Other Ambulatory Visit: Payer: Self-pay | Admitting: Student

## 2020-11-28 DIAGNOSIS — I428 Other cardiomyopathies: Secondary | ICD-10-CM

## 2020-11-28 DIAGNOSIS — I10 Essential (primary) hypertension: Secondary | ICD-10-CM

## 2020-11-28 DIAGNOSIS — Z8673 Personal history of transient ischemic attack (TIA), and cerebral infarction without residual deficits: Secondary | ICD-10-CM

## 2020-11-28 DIAGNOSIS — R609 Edema, unspecified: Secondary | ICD-10-CM

## 2020-11-28 DIAGNOSIS — M329 Systemic lupus erythematosus, unspecified: Secondary | ICD-10-CM

## 2020-11-28 DIAGNOSIS — Z87898 Personal history of other specified conditions: Secondary | ICD-10-CM

## 2020-11-28 DIAGNOSIS — R0683 Snoring: Secondary | ICD-10-CM

## 2020-11-28 NOTE — Chronic Care Management (AMB) (Signed)
Chronic Care Management Pharmacy Note  11/28/2020 Name:  Amanda Saunders MRN:  [id number] DOB:  [birth year]  Subjective: Amanda Saunders is an 65 y.o. year old female who is a primary patient of Copland, Gay Filler, MD.  The CCM team was consulted for assistance with disease management and care coordination needs.    Engaged with patient by telephone for follow up visit in response to provider referral for pharmacy case management and/or care coordination services.   Consent to Services:  The patient was given information about Chronic Care Management services, agreed to services, and gave verbal consent prior to initiation of services.  Please see initial visit note for detailed documentation.   Patient Care Team: Copland, Gay Filler, MD as PCP - General (Family Medicine) Stanford Breed Denice Bors, MD as PCP - Cardiology (Cardiology) Pieter Partridge, DO as Consulting Physician (Neurology) Marvene Staff (Rheumatology) Cherre Robins, PharmD (Pharmacist)  Recent office visits: 07/16/2020 - PCP (Dr Lorelei Pont) Acute visit for abdominal pain / epigatric pain; Prescribed sucralfate 1 gram qid for 10 days and omeprazole 56m qd for 2 months. Labs checked FOBT, CBC, CMP and h.pylori. Advised to stop NSAID use  Recent consult visits: 11[facility]23/2020 [facility]action [facility] and history of CVA. BP was slightly elevated at visti. HCTZ increased from 278mevery other day to 252maily. Sleep study ordered due to report of snoring and not feeling well rested in AM.  06/25/2020 - [facility] ([facility] SLE and RA; Stopped Orencia; restarted methotrexate 0.6 mL IM weekly and folic acid 1mg43mily; also given Depo Medrol 80mg62min office.   Hospital visits: None in previous 6 months  Objective:  Lab Results  Component Value Date   CREATININE 0.96 11/23/2020   CREATININE 0.93 07/16/2020   CREATININE 0.88 02/23/2020    Lab Results  Component Value Date    HGBA1C 6.3 02/23/2020   Last diabetic Eye exam: No results found for: HMDIABEYEEXA  Last diabetic Foot exam: No results found for: HMDIABFOOTEX      Component Value Date/Time   CHOL 108 06/08/2020 0925   TRIG 76.0 06/08/2020 0925   HDL 38.80 (L) 06/08/2020 0925   CHOLHDL 3 06/08/2020 0925   VLDL 15.2 06/08/2020 0925   LDLCALC 54 06/08/2020 0925    Hepatic Function Latest Ref Rng & Units 07/16/2020 02/23/2020 08/04/2019  Total Protein 6.0 - 8.3 g/dL 7.0 7.1 7.1  Albumin 3.5 - 5.2 g/dL 4.2 4.3 4.4  AST 0 - 37 U/L 26 32 28  ALT 0 - 35 U/L 40(H) 42(H) 40(H)  Alk Phosphatase 39 - 117 U/L 73 80 89  Total Bilirubin 0.2 - 1.2 mg/dL 0.3 0.3 0.3  Bilirubin, Direct 0.0 - 0.3 mg/dL - - -    Lab Results  Component Value Date/Time   TSH 2.68 08/04/2019 11:31 AM   TSH 4.08 02/03/2018 11:20 AM   FREET4 0.75 02/03/2018 11:20 AM    CBC Latest Ref Rng & Units 07/16/2020 02/23/2020 08/04/2019  WBC 4.0 - 10.5 K/uL 6.8 6.1 7.0  Hemoglobin 12.0 - 15.0 g/dL 13.3 13.2 13.2  Hematocrit 36.0 - 46.0 % 40.3 39.7 40.1  Platelets 150.0 - 400.0 K/uL 222.0 208.0 272.0    Lab Results  Component Value Date/Time   VD25OH 25.88 (L) 04/02/2016 12:00 PM    Clinical ASCVD: Yes  The ASCVD Risk score (GoffMikey Bussingr., et al., 2013) failed to calculate for the following reasons:   The  patient has a prior MI or stroke diagnosis     Social History   Tobacco Use  Smoking Status Former Smoker  . Quit date: 07/21/2004  . Years since quitting: 16.3  Smokeless Tobacco Never Used   BP Readings from Last 3 Encounters:  11/23/20 (!) 142/79  07/16/20 118/88  03/14/20 120/78   Pulse Readings from Last 3 Encounters:  11/23/20 62  07/16/20 66  03/14/20 71   Wt Readings from Last 3 Encounters:  11/23/20 201 lb (91.2 kg)  07/16/20 190 lb (86.2 kg)  04/03/20 198 lb (89.8 kg)    Assessment: Review of patient past medical history, allergies, medications, health status, including review of consultants reports,  laboratory and other test data, was performed as part of comprehensive evaluation and provision of chronic care management services.   SDOH:  (Social Determinants of Health) assessments and interventions performed:  SDOH Interventions   Flowsheet Row Most Recent Value  SDOH Interventions   Financial Strain Interventions Intervention Not Indicated  Physical Activity Interventions Other (Comments)  [patient encouraged to increase physical activity as able - limited by RA and cardio condition]      CCM Care Plan  Allergies  Allergen Reactions  . Codeine Nausea And Vomiting    Medications Reviewed Today    Reviewed by Cherre Robins, PharmD (Pharmacist) on 11/28/20 at [place] List Status: <None>  Medication Order Taking? Sig Documenting Provider Last Dose Status Informant  acetaminophen (TYLENOL) 325 MG tablet [id number] Yes Take 2 tablets (650 mg total) by mouth every 4 (four) hours as needed for mild pain (or temp > 37.5 C (99.5 F)). Domenic Polite, MD Taking Active Self  ELIQUIS 5 MG TABS tablet [id number] Yes TAKE ONE TABLET BY MOUTH AT BREAKFAST AND AT BEDTIME Lelon Perla, MD Taking Active   folic acid (FOLVITE) 1 MG tablet [id number] Yes Take 1 mg by mouth daily. [provider] Taking Active Self  gabapentin (NEURONTIN) 600 MG tablet [id number] Yes TAKE ONE TABLET BY MOUTH EVERY MORNING and TAKE ONE TABLET BY MOUTH EVERYDAY AT BEDTIME Jaffe, Adam R, DO Taking Active   hydrochlorothiazide (HYDRODIURIL) 25 MG tablet [id number] Yes Take 1 tablet (25 mg total) by mouth daily. Darreld Mclean, PA-C Taking Active   hydroxychloroquine (PLAQUENIL) 200 MG tablet [id number] Yes Take 200 mg by mouth 2 (two) times daily. [provider] Taking Active Self  losartan (COZAAR) 50 MG tablet [id number] Yes Take 1 tablet (50 mg total) by mouth daily. Copland, Gay Filler, MD Taking Active   meclizine (ANTIVERT) 25 MG tablet [id number] Yes TAKE ONE TABLET BY MOUTH twice daily AS NEEDED  FOR dizziness Copland, Gay Filler, MD Taking Active   methocarbamol (ROBAXIN) 500 MG tablet [id number] Yes Take 1 tablet (500 mg total) by mouth every 8 (eight) hours as needed for muscle spasms. Copland, Gay Filler, MD Taking Active   Methotrexate 2.5 MG/ML SOLN [id number] Yes Inject into the muscle. [provider] Taking Active   metoprolol succinate (TOPROL-XL) 25 MG 24 hr tablet [id number] Yes Take 1 tablet (25 mg total) by mouth every morning. Copland, Gay Filler, MD Taking Active   omeprazole (PRILOSEC) 40 MG capsule [id number] Yes Take 1 capsule (40 mg total) by mouth daily. Copland, Gay Filler, MD Taking Active   [facility] CLICKJECT 458 MG/ML Darden Palmer [id number] No Inject 1 Syringe into the skin once a week.  Patient not taking: Reported on 11/28/2020   [provider] Not Taking Active   rosuvastatin (CRESTOR) 20 MG  tablet [id number] Yes Take 1 tablet (20 mg total) by mouth daily. Copland, Gay Filler, MD Taking Active   sucralfate (CARAFATE) 1 g tablet [id number] Yes TAKE 1 TABLET BY MOUTH 4 TIMES DAILY WITH MEALS AND AT BEDTIME Copland, Gay Filler, MD Taking Active           Patient Active Problem List   Diagnosis Date Noted  . 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 Systemic lupus erythematosus ([place]) 08/04/2019  . Acute ischemic stroke ([place]) 08/14/2018  . HTN (hypertension) 08/14/2018  . Pre-diabetes 04/03/2016  . History of CVA (cerebrovascular accident) 03/12/2016  . Chest pain at rest 03/12/2016  . Peripheral edema 03/12/2016    Immunization History  Administered Date(s) Administered  . Moderna Sars-Covid-2 Vaccination 11/17/2019, 12/15/2019, 07/06/2020  . Tdap 03/12/2016    Conditions to be addressed/monitored: HTN, HLD and cardiomyopathy; h/o ischemic stroke; migraine HA; SLE; RA; h/o fattly liver and increased LFTs  There are no care plans that you recently modified to display for this patient.   Medication Assistance: None required.  Patient affirms  current coverage meets needs.  Patient's preferred pharmacy is:  [facility] - [place], [place] - [address] [place] [address] Phone: [phone] Fax: [phone]  [place], [place] - [address] [place] [address] Phone: [phone] Fax: [phone]  Uses pill box? No - She was using packaging and liked but she is currently helping her sister movedd from [place] and she had to transfer meds to [facility] in [place]. Pt endorses 100% compliance  Follow Up:  Patient agrees to Care Plan and Follow-up.  Plan: Telephone follow up appointment with care management team member scheduled for:  3 months  Cherre Robins, PharmD Clinical Pharmacist [place] [facility]

## 2020-11-28 NOTE — Patient Instructions (Signed)
Visit Information  PATIENT GOALS: Goals Addressed            This Visit's Progress   . A1c goal less than 6.5%   On track    -Pre-Diabetes a1c range is 5.7% to 6.4%. Your most recent a1c was 6.1% on 08/04/2019.  -Usually the full diabetes diagnosis is given when a1c reaches 6.5% or higher.     . Chronic Care Management Pharmacy Care Plan   On track    CARE PLAN ENTRY (see longitudinal plan of care for additional care plan information)  Current Barriers:  . Chronic Disease Management support, education, and care coordination needs related to Pre-Diabetes, Hypertension, Hx of CVA, Noncompaction Cardiomyopathy, Lupus, Gout, Pain   Hypertension / cardiomyopathy / swelling: BP Readings from Last 3 Encounters:  11/23/20 (!) 142/79  07/16/20 118/88  03/14/20 120/78   . Pharmacist Clinical Goal(s): o Over the next 180 days, patient will work with PharmD and providers to maintain BP goal <140/90 . Current regimen:   Losartan 50mg  daily  Hydrochlorothiazide 25mg  every day (dose increased 11/23/2020)  Metoprolol succinate 25mg  daily . Interventions: o Discussed recent change in medications for blood pressure o Coordinated with [facility] to get new prescription for hydrochlorothiazide transferred from [facility] patient to check blood pressure 2 times per week and record o Discussed weighing daily; patient to consider using [facility] quarterly $40 OTC benefits to purchase scale . Patient self care activities - Over the next 180 days, patient will: o Check blood pressure 2 times per week, document, and provide at future appointments o Have labs checked in 1 month per cardiologist o Purchase scale and check weight daily as recommended by cardiology. o Ensure daily salt intake < 2300 mg/day  Hyperlipidemia Lab Results  Component Value Date/Time   LDLCALC 54 06/08/2020 09:25 AM   . Pharmacist Clinical Goal(s): o Over the next 180 days, patient  will work with PharmD and providers to maintain LDL goal < 70 . Current regimen:  o Rosuvastatin 20mg  daily . Interventions: o Review last lipid panel and LDL goal . Patient self care activities - Over the next 180 days, patient will: o Maintain cholesterol medication regimen.  Pre-Diabetes Lab Results  Component Value Date/Time   HGBA1C 6.3 02/23/2020 09:48 AM   HGBA1C 6.1 08/04/2019 11:31 AM   . Pharmacist Clinical Goal(s): o Over the next 180 days, patient will work with PharmD and providers to maintain A1c goal <6.5% . Current regimen:  o Diet and exercise management   . Patient self care activities - Over the next 180 days, patient will: o Maintain a1c <6.5%  Abdominal Pain / history of ulcer: . Pharmacist Clinical Goal(s): o Over the next 180 days, patient will work with PharmD and providers to improved abdominal pain and reduce risk of recurrence of ulcer . Current regimen:  o Omeprazole 40mg  daily  o Sucralfate 1 gram 4 times a day . Interventions: o Discussed medications that can increase risk of ulcer.  o Continue to avoid naprosyn and other over the counter anti-inflammatory medications (Naproxen, ibuprofen, Aleve, Motrin, Advil) . Patient self care activities - Over the next 180 days, patient will: o Avoid over the counter anti-inflammatory medications o Can use Tylenol / acetaminophen for pain, headache, fever if needed (max of 3000mg  / day) o Continue current regimen   Health Maintenance  . Pharmacist Clinical Goal(s) o Over the next 180 days, patient will work with PharmD and providers to [employer]  up to date on health maintenance  . Interventions: o Recommended patient complete Shingrix vaccine series . Patient self care activities - Over the next 180 days, patient will: o Complete Shingrix vaccine series if agreeable  Medication management . Pharmacist Clinical Goal(s): o Over the next 180 days, patient will work with PharmD and providers to maintain optimal  medication adherence . Current pharmacy: [facility] . Interventions o Comprehensive medication review performed. o When you are back home in [place] we had assist in transfer of medications back to you preferred pharmacy and packaging if needed.  . Patient self care activities - Over the next 90 days, patient will: o Focus on medication adherence by filling medications appropriately  o Take medications as prescribed o Report any questions or concerns to PharmD and/or provider(s)  Please see past updates related to this goal by clicking on the "Past Updates" button in the selected goal      . Increase physical activity   Not on track    Walk 20 min 3x/week, (use 5 min rule)    . LDL goal less than 70   On track   . Take Eliquis 5mg  twice daily instead of 2 tablets once daily   On track      Patient verbalizes understanding of instructions provided today and agrees to view in [place].   Telephone follow up appointment with care management team member scheduled for: 3 months  Cherre Robins, PharmD Clinical Pharmacist [facility] [place] [phone]

## 2020-11-28 NOTE — Telephone Encounter (Signed)
Left sleep study appointment details on voice mail. 

## 2020-11-29 NOTE — Addendum Note (Signed)
Addended by: Carylon Perches on: 11/29/2020 05:29 PM   Modules accepted: Orders

## 2020-12-25 ENCOUNTER — Ambulatory Visit (INDEPENDENT_AMBULATORY_CARE_PROVIDER_SITE_OTHER): Payer: Medicare Other

## 2020-12-25 VITALS — Ht 63.0 in | Wt 193.0 lb

## 2020-12-25 DIAGNOSIS — Z0000 Encounter for general adult medical examination without abnormal findings: Secondary | ICD-10-CM

## 2020-12-25 NOTE — Patient Instructions (Signed)
[name] , Thank you for taking time to complete your Medicare Wellness Visit. I appreciate your ongoing commitment to your health goals. Please review the following plan we discussed and let me know if I can assist you in the future.   Screening recommendations/referrals: Colonoscopy: Completed 09/04/2011-Due 09/03/2021 Mammogram: Due. Per our conversation, you will call to schedule. Bone Density: Not yet indicated. Due at age 65. Recommended yearly ophthalmology/optometry visit for glaucoma screening and checkup Recommended yearly dental visit for hygiene and checkup  Vaccinations: Influenza vaccine: Declined Pneumococcal vaccine: Not yet indicated. Due at age 45 Tdap vaccine: Up to date- Due-02/2026 Shingles vaccine: Discuss with pharmacy  Covid-19: Up to date  Advanced directives: Information mailed today  Conditions/risks identified: See problem list  Next appointment: Follow up in one year for your annual wellness visit. 12/30/2021 @ 9:00  Preventive Care 40-64 Years, Female Preventive care refers to lifestyle choices and visits with your health care provider that can promote health and wellness. What does preventive care include?  A yearly physical exam. This is also called an annual well check.  Dental exams once or twice a year.  Routine eye exams. Ask your health care provider how often you should have your eyes checked.  Personal lifestyle choices, including:  Daily care of your teeth and gums.  Regular physical activity.  Eating a healthy diet.  Avoiding tobacco and drug use.  Limiting alcohol use.  Practicing safe sex.  Taking low-dose aspirin daily starting at age 72.  Taking vitamin and mineral supplements as recommended by your health care provider. What happens during an annual well check? The services and screenings done by your health care provider during your annual well check will depend on your age, overall health, lifestyle risk factors, and  family history of disease. Counseling  Your health care provider may ask you questions about your:  Alcohol use.  Tobacco use.  Drug use.  Emotional well-being.  Home and relationship well-being.  Sexual activity.  Eating habits.  Work and work Statistician.  Method of birth control.  Menstrual cycle.  Pregnancy history. Screening  You may have the following tests or measurements:  Height, weight, and BMI.  Blood pressure.  Lipid and cholesterol levels. These may be checked every 5 years, or more frequently if you are over 42 years old.  Skin check.  Lung cancer screening. You may have this screening every year starting at age 48 if you have a 30-pack-year history of smoking and currently smoke or have quit within the past 15 years.  Fecal occult blood test (FOBT) of the stool. You may have this test every year starting at age 58.  Flexible sigmoidoscopy or colonoscopy. You may have a sigmoidoscopy every 5 years or a colonoscopy every 10 years starting at age 14.  Hepatitis C blood test.  Hepatitis B blood test.  Sexually transmitted disease (STD) testing.  Diabetes screening. This is done by checking your blood sugar (glucose) after you have not eaten for a while (fasting). You may have this done every 1-3 years.  Mammogram. This may be done every 1-2 years. Talk to your health care provider about when you should start having regular mammograms. This may depend on whether you have a family history of breast cancer.  BRCA-related cancer screening. This may be done if you have a family history of breast, ovarian, tubal, or peritoneal cancers.  Pelvic exam and Pap test. This may be done every 3 years starting at age 46. Starting at  age 34, this may be done every 5 years if you have a Pap test in combination with an HPV test.  Bone density scan. This is done to screen for osteoporosis. You may have this scan if you are at high risk for osteoporosis. Discuss your  test results, treatment options, and if necessary, the need for more tests with your health care provider. Vaccines  Your health care provider may recommend certain vaccines, such as:  Influenza vaccine. This is recommended every year.  Tetanus, diphtheria, and acellular pertussis (Tdap, Td) vaccine. You may need a Td booster every 10 years.  Zoster vaccine. You may need this after age 79.  Pneumococcal 13-valent conjugate (PCV13) vaccine. You may need this if you have certain conditions and were not previously vaccinated.  Pneumococcal polysaccharide (PPSV23) vaccine. You may need one or two doses if you smoke cigarettes or if you have certain conditions. Talk to your health care provider about which screenings and vaccines you need and how often you need them. This information is not intended to replace advice given to you by your health care provider. Make sure you discuss any questions you have with your health care provider. Document Released: 08/03/2015 Document Revised: 03/26/2016 Document Reviewed: 05/08/2015 Elsevier Interactive Patient Education  2017 [place] Prevention in the Home Falls can cause injuries. They can happen to people of all ages. There are many things you can do to make your home safe and to help prevent falls. What can I do on the outside of my home?  Regularly fix the edges of walkways and driveways and fix any cracks.  Remove anything that might make you trip as you walk through a door, such as a raised step or threshold.  Trim any bushes or trees on the path to your home.  Use bright outdoor lighting.  Clear any walking paths of anything that might make someone trip, such as rocks or tools.  Regularly check to see if handrails are loose or broken. Make sure that both sides of any steps have handrails.  Any raised decks and porches should have guardrails on the edges.  Have any leaves, snow, or ice cleared regularly.  Use sand or  salt on walking paths during winter.  Clean up any spills in your garage right away. This includes oil or grease spills. What can I do in the bathroom?  Use night lights.  Install grab bars by the toilet and in the tub and shower. Do not use towel bars as grab bars.  Use non-skid mats or decals in the tub or shower.  If you need to sit down in the shower, use a plastic, non-slip stool.  Keep the floor dry. Clean up any water that spills on the floor as soon as it happens.  Remove soap buildup in the tub or shower regularly.  Attach bath mats securely with double-sided non-slip rug tape.  Do not have throw rugs and other things on the floor that can make you trip. What can I do in the bedroom?  Use night lights.  Make sure that you have a light by your bed that is easy to reach.  Do not use any sheets or blankets that are too big for your bed. They should not hang down onto the floor.  Have a firm chair that has side arms. You can use this for support while you get dressed.  Do not have throw rugs and other things on the floor that  can make you trip. What can I do in the kitchen?  Clean up any spills right away.  Avoid walking on wet floors.  Keep items that you use a lot in easy-to-reach places.  If you need to reach something above you, use a strong step stool that has a grab bar.  Keep electrical cords out of the way.  Do not use floor polish or wax that makes floors slippery. If you must use wax, use non-skid floor wax.  Do not have throw rugs and other things on the floor that can make you trip. What can I do with my stairs?  Do not leave any items on the stairs.  Make sure that there are handrails on both sides of the stairs and use them. Fix handrails that are broken or loose. Make sure that handrails are as long as the stairways.  Check any carpeting to make sure that it is firmly attached to the stairs. Fix any carpet that is loose or worn.  Avoid having  throw rugs at the top or bottom of the stairs. If you do have throw rugs, attach them to the floor with carpet tape.  Make sure that you have a light switch at the top of the stairs and the bottom of the stairs. If you do not have them, ask someone to add them for you. What else can I do to help prevent falls?  Wear shoes that:  Do not have high heels.  Have rubber bottoms.  Are comfortable and fit you well.  Are closed at the toe. Do not wear sandals.  If you use a stepladder:  Make sure that it is fully opened. Do not climb a closed stepladder.  Make sure that both sides of the stepladder are locked into place.  Ask someone to hold it for you, if possible.  Clearly mark and make sure that you can see:  Any grab bars or handrails.  First and last steps.  Where the edge of each step is.  Use tools that help you move around (mobility aids) if they are needed. These include:  Canes.  Walkers.  Scooters.  Crutches.  Turn on the lights when you go into a dark area. Replace any light bulbs as soon as they burn out.  Set up your furniture so you have a clear path. Avoid moving your furniture around.  If any of your floors are uneven, fix them.  If there are any pets around you, be aware of where they are.  Review your medicines with your doctor. Some medicines can make you feel dizzy. This can increase your chance of falling. Ask your doctor what other things that you can do to help prevent falls. This information is not intended to replace advice given to you by your health care provider. Make sure you discuss any questions you have with your health care provider. Document Released: 05/03/2009 Document Revised: 12/13/2015 Document Reviewed: 08/11/2014 Elsevier Interactive Patient Education  2017 [employer].

## 2020-12-25 NOTE — Progress Notes (Signed)
Subjective:   Amanda Saunders is a 65 y.o. female who presents for Medicare Annual (Subsequent) preventive examination.  I connected with Amanda Saunders today by telephone and verified that I am speaking with the correct person using two identifiers. Location patient: home Location provider: work Persons participating in the virtual visit: patient, Marine scientist.    I discussed the limitations, risks, security and privacy concerns of performing an evaluation and management service by telephone and the availability of in person appointments. I also discussed with the patient that there may be a patient responsible charge related to this service. The patient expressed understanding and verbally consented to this telephonic visit.    Interactive audio and video telecommunications were attempted between this provider and patient, however failed, due to patient having technical difficulties OR patient did not have access to video capability.  We continued and completed visit with audio only.  Some vital signs may be absent or patient reported.   Time Spent with patient on telephone encounter: 25 minutes   Review of Systems     Cardiac Risk Factors include: hypertension;obesity (BMI >30kg/m2);sedentary lifestyle     Objective:    Today's Vitals   12/25/20 0939  Weight: 193 lb (87.5 kg)  Height: 5\' 3"  (1.6 m)   Body mass index is 34.19 kg/m.  Advanced Directives 12/25/2020 04/03/2020 12/20/2019 11/24/2019 07/25/2019 03/21/2019 03/11/2019  Does Patient Have a Medical Advance Directive? No No No No No No No  Does patient want to make changes to medical advance directive? - - - - - - -  Would patient like information on creating a medical advance directive? Yes (MAU/Ambulatory/Procedural Areas - Information given) - No - Patient declined - - No - Patient declined -    Current Medications (verified) Outpatient Encounter Medications as of 12/25/2020  Medication Sig  . acetaminophen (TYLENOL) 325 MG tablet  Take 2 tablets (650 mg total) by mouth every 4 (four) hours as needed for mild pain (or temp > 37.5 C (99.5 F)).  Marland Kitchen ELIQUIS 5 MG TABS tablet TAKE ONE TABLET BY MOUTH AT BREAKFAST AND AT BEDTIME  . folic acid (FOLVITE) 1 MG tablet Take 1 mg by mouth daily.  Marland Kitchen gabapentin (NEURONTIN) 600 MG tablet TAKE ONE TABLET BY MOUTH EVERY MORNING and TAKE ONE TABLET BY MOUTH EVERYDAY AT BEDTIME  . hydrochlorothiazide (HYDRODIURIL) 25 MG tablet Take 1 tablet (25 mg total) by mouth daily.  . hydroxychloroquine (PLAQUENIL) 200 MG tablet Take 200 mg by mouth 2 (two) times daily.  Marland Kitchen losartan (COZAAR) 50 MG tablet Take 1 tablet (50 mg total) by mouth daily.  . meclizine (ANTIVERT) 25 MG tablet TAKE ONE TABLET BY MOUTH twice daily AS NEEDED FOR dizziness  . methocarbamol (ROBAXIN) 500 MG tablet Take 1 tablet (500 mg total) by mouth every 8 (eight) hours as needed for muscle spasms.  . Methotrexate Sodium (METHOTREXATE, PF,) 50 MG/2ML injection Inject 0.6 mLs into the muscle once a week.  . metoprolol succinate (TOPROL-XL) 25 MG 24 hr tablet Take 1 tablet (25 mg total) by mouth every morning.  Marland Kitchen omeprazole (PRILOSEC) 40 MG capsule Take 1 capsule (40 mg total) by mouth daily.  . rosuvastatin (CRESTOR) 20 MG tablet Take 1 tablet (20 mg total) by mouth daily.  . sucralfate (CARAFATE) 1 g tablet TAKE 1 TABLET BY MOUTH 4 TIMES DAILY WITH MEALS AND AT BEDTIME   No facility-administered encounter medications on file as of 12/25/2020.    Allergies (verified) Codeine   History: Past Medical  History:  Diagnosis Date  . Cardiomyopathy ([place])   . Hypertension   . Lupus ([place])   . PUD (peptic ulcer disease)   . Stroke ([place]) 08/13/2018  . Vertigo    Past Surgical History:  Procedure Laterality Date  . BACK SURGERY    . CARPAL TUNNEL RELEASE    . NECK SURGERY     2015   Family History  Problem Relation Age of Onset  . Hypertension Mother   . Arthritis Mother   . Heart failure Mother   . Stroke Mother   . Kidney  disease Father   . Hypertension Father    Social History   Socioeconomic History  . Marital status: Single    Spouse name: Not on file  . Number of children: 1  . Years of education: 45  . Highest education level: Not on file  Occupational History  . Occupation: retired    Comment: retired  Tobacco Use  . Smoking status: Former Smoker    Quit date: 07/21/2004    Years since quitting: 16.4  . Smokeless tobacco: Never Used  Vaping Use  . Vaping Use: Never used  Substance and Sexual Activity  . Alcohol use: Yes    Comment: occasional  . Drug use: No  . Sexual activity: Not on file  Other Topics Concern  . Not on file  Social History Narrative   Lives one level with son/grandchildren; Left handed; high school grad; retired; walks for exercise; no caffeine   Social Determinants of Health   Financial Resource Strain: Low Risk   . Difficulty of Paying Living Expenses: Not hard at all  Food Insecurity: No Food Insecurity  . Worried About Charity fundraiser in the Last Year: Never true  . Ran Out of Food in the Last Year: Never true  Transportation Needs: No Transportation Needs  . Lack of Transportation (Medical): No  . Lack of Transportation (Non-Medical): No  Physical Activity: Insufficiently Active  . Days of Exercise per Week: 3 days  . Minutes of Exercise per Session: 10 min  Stress: No Stress Concern Present  . Feeling of Stress : Not at all  Social Connections: Moderately Isolated  . Frequency of Communication with Friends and Family: More than three times a week  . Frequency of Social Gatherings with Friends and Family: More than three times a week  . Attends Religious Services: More than 4 times per year  . Active Member of Clubs or Organizations: No  . Attends Archivist Meetings: Never  . Marital Status: Never married    Tobacco Counseling Counseling given: Not Answered   Clinical Intake:  Pre-visit preparation completed: Yes  Pain :  No/denies pain     Nutritional Status: BMI > 30  Obese Nutritional Risks: None Diabetes: No  How often do you need to have someone help you when you read instructions, pamphlets, or other written materials from your doctor or pharmacy?: 1 - Never  Diabetic?No  Interpreter Needed?: No  Information entered by :: Caroleen Hamman LPN   Activities of Daily Living In your present state of health, do you have any difficulty performing the following activities: 12/25/2020  Hearing? N  Vision? N  Difficulty concentrating or making decisions? N  Walking or climbing stairs? N  Dressing or bathing? N  Doing errands, shopping? N  Preparing Food and eating ? N  Using the Toilet? N  In the past six months, have you accidently leaked urine? N  Do  you have problems with loss of bowel control? N  Managing your Medications? N  Managing your Finances? N  Housekeeping or managing your Housekeeping? N  Some recent data might be hidden    Patient Care Team: Copland, Gay Filler, MD as PCP - General (Family Medicine) Stanford Breed Denice Bors, MD as PCP - Cardiology (Cardiology) Pieter Partridge, DO as Consulting Physician (Neurology) Rosita Kea, PA-C (Rheumatology) Cherre Robins, PharmD (Pharmacist)  Indicate any recent Medical Services you may have received from other than [facility] providers in the past year (date may be approximate).     Assessment:   This is a routine wellness examination for [place].  Hearing/Vision screen  Hearing Screening   125Hz  250Hz  500Hz  1000Hz  2000Hz  3000Hz  4000Hz  6000Hz  8000Hz   Right ear:           Left ear:           Comments: No issues  Vision Screening Comments: Last eye exam-2017  Dietary issues and exercise activities discussed: Current Exercise Habits: The patient does not participate in regular exercise at present, Exercise limited by: None identified  Goals Addressed            This Visit's Progress   . Increase physical activity   On track    Walk 20  min 3x/week, (use 5 min rule)    . Patient Stated       Drink more water       Depression Screen PHQ 2/9 Scores 12/25/2020 12/20/2019 12/17/2018 06/18/2018 10/22/2016  PHQ - 2 Score 0 0 0 0 0    Fall Risk Fall Risk  12/25/2020 11/28/2020 04/03/2020 12/20/2019 11/24/2019  Falls in the past year? 0 0 0 0 1  Number falls in past yr: 0 - 0 0 0  Injury with Fall? 0 - 0 0 0  Follow up Falls prevention discussed - - Education provided;Falls prevention discussed -    FALL RISK PREVENTION PERTAINING TO THE HOME:  Any stairs in or around the home? No  Home free of loose throw rugs in walkways, pet beds, electrical cords, etc? Yes  Adequate lighting in your home to reduce risk of falls? Yes   ASSISTIVE DEVICES UTILIZED TO PREVENT FALLS:  Life alert? No  Use of a cane, walker or w/c? No  Grab bars in the bathroom? No  Shower chair or bench in shower? No  Elevated toilet seat or a handicapped toilet? No   TIMED UP AND GO:  Was the test performed? No .phone visit    Cognitive Function:Normal cognitive status assessed by  this Nurse Health Advisor. No abnormalities found.   MMSE - Mini Mental State Exam 05/14/2016  Orientation to time 5  Orientation to Place 5  Registration 3  Attention/ Calculation 4  Recall 3  Language- name 2 objects 2  Language- repeat 1  Language- follow 3 step command 3  Language- read & follow direction 1  Write a sentence 1  Copy design 1  Total score 29        Immunizations Immunization History  Administered Date(s) Administered  . Moderna Sars-Covid-2 Vaccination 11/17/2019, 12/15/2019, 07/06/2020  . Tdap 03/12/2016    TDAP status: Up to date  Flu Vaccine status: Declined, Education has been provided regarding the importance of this vaccine but patient still declined. Advised may receive this vaccine at local pharmacy or Health Dept. Aware to provide a copy of the vaccination record if obtained from local pharmacy or Health Dept. Verbalized acceptance  and  understanding.  Pneumococcal vaccine status: Not yet indicated  Covid-19 vaccine status: Completed vaccines  Qualifies for Shingles Vaccine? Yes   Zostavax completed No   Shingrix Completed?: No.    Education has been provided regarding the importance of this vaccine. Patient has been advised to call insurance company to determine out of pocket expense if they have not yet received this vaccine. Advised may also receive vaccine at local pharmacy or Health Dept. Verbalized acceptance and understanding.  Screening Tests Health Maintenance  Topic Date Due  . Zoster Vaccines- Shingrix (1 of 2) Never done  . INFLUENZA VACCINE  02/18/2021  . COLONOSCOPY (Pts 45-72yrs Insurance coverage will need to be confirmed)  09/03/2021  . MAMMOGRAM  11/06/2021  . PAP SMEAR-Modifier  08/03/2022  . TETANUS/TDAP  03/12/2026  . COVID-19 Vaccine  Completed  . Hepatitis C Screening  Completed  . HIV Screening  Completed  . Pneumococcal Vaccine 52-50 Years old  Aged Out  . HPV VACCINES  Aged Out    Health Maintenance  Health Maintenance Due  Topic Date Due  . Zoster Vaccines- Shingrix (1 of 2) Never done    Colorectal cancer screening: Type of screening: Colonoscopy. Completed 09/04/2011. Repeat every 10 years  Mammogram status: Due-Patient states she will call to schedule  Bone Density status: Not yet indicated  Lung Cancer Screening: (Low Dose CT Chest recommended if Age 17-80 years, 30 pack-year currently smoking OR have quit w/in 15years.) does not qualify.     Additional Screening:  Hepatitis C Screening:Completed 08/02/2018  Vision Screening: Recommended annual ophthalmology exams for early detection of glaucoma and other disorders of the eye. Is the patient up to date with their annual eye exam?  No  Who is the provider or what is the name of the office in which the patient attends annual eye exams? unsure Patient advised to schedule an appt  Dental Screening: Recommended annual  dental exams for proper oral hygiene  Community Resource Referral / Chronic Care Management: CRR required this visit?  No   CCM required this visit?  No      Plan:     I have personally reviewed and noted the following in the patient's chart:   . Medical and social history . Use of alcohol, tobacco or illicit drugs  . Current medications and supplements including opioid prescriptions.  . Functional ability and status . Nutritional status . Physical activity . Advanced directives . List of other physicians . Hospitalizations, surgeries, and ER visits in previous 12 months . Vitals . Screenings to include cognitive, depression, and falls . Referrals and appointments  In addition, I have reviewed and discussed with patient certain preventive protocols, quality metrics, and best practice recommendations. A written personalized care plan for preventive services as well as general preventive health recommendations were provided to patient.   Due to this being a telephonic visit, the after visit summary with patients personalized plan was offered to patient via mail or my-chart. Patient would like to access on my-chart.    Marta Antu, LPN   01/23/1024  Nurse Health Advisor  Nurse Notes: None

## 2020-12-28 ENCOUNTER — Encounter: Payer: Self-pay | Admitting: Family Medicine

## 2020-12-28 DIAGNOSIS — R7303 Prediabetes: Secondary | ICD-10-CM

## 2021-01-07 ENCOUNTER — Other Ambulatory Visit: Payer: Self-pay | Admitting: Neurology

## 2021-01-07 ENCOUNTER — Other Ambulatory Visit: Payer: Self-pay

## 2021-01-09 ENCOUNTER — Other Ambulatory Visit: Payer: Self-pay | Admitting: Neurology

## 2021-01-09 MED ORDER — GABAPENTIN 600 MG PO TABS: 600.0000 mg | ORAL_TABLET | Freq: Two times a day (BID) | ORAL | 0 refills | Status: DC

## 2021-01-09 NOTE — Telephone Encounter (Signed)
Amanda Saunders called in regarding her medication refill for gabapentin. Pharmacy told her they faxed [place] 2 days ago. She wants a call back [phone]

## 2021-01-11 ENCOUNTER — Encounter: Payer: Self-pay | Admitting: Family Medicine

## 2021-01-11 ENCOUNTER — Other Ambulatory Visit: Payer: Self-pay | Admitting: Family Medicine

## 2021-01-11 NOTE — Telephone Encounter (Signed)
Patient is wondering if Dr. Lorelei Pont would give refill her Gabapentin. Patient states Dr. Tomi Likens dont have any appt till September and she is completely out  gabapentin (NEURONTIN) 600 MG tablet [[id number]]   [place], [place] - [address]  [place], [place] [address]  Phone:  [phone]  Fax:  [phone]

## 2021-01-11 NOTE — Telephone Encounter (Signed)
I have pended medication please refill if appropriate.

## 2021-01-13 MED ORDER — GABAPENTIN 600 MG PO TABS: ORAL_TABLET | ORAL | 1 refills | Status: DC

## 2021-01-21 NOTE — Progress Notes (Signed)
[place] at [facility] [address], [place], [place] [address] [phone]  Date:  01/28/2021   Name:  Amanda Saunders   DOB:  1955/08/07   MRN:  [id number]  PCP:  Darreld Mclean, MD    Chief Complaint: lab work follow up    History of Present Illness:  Amanda Saunders is a 65 y.o. very pleasant female patient who presents with the following:  Here today for a follow-up visit and to discuss new diagnosis of well-controlled diabetes- history of stroke, prediabetes, lupus, hypertension, noncompaction cardomyopathy  Last seen by myself in December   Seen by cardiology in May-  Dyspnea on Exertion Noncompaction Cardiomyopathy - She does not worsening dyspnea with activity such as walking up steps. - Most recent Echo in 02/2020 showed LVEF of 40-45%.. Prior Myoview showed no evidence of ischemia and cardiac MRI confirmed noncompaction of LV. - Appears euvolemic on exam. - Continue Losartan 50mg  daily. - Continue Toprol-XL 25mg  daily. - Discussed importance of daily weights and sodium/fluid restrictions. Patient to notify us if she has 3lb weight gain in 1 day or 5lb weight gain in 1 week. - Will check BNP and BMET today.  - Patient has had a CVA which could have been related to intracranial vascular disease but also to noncompaction. Therefore, patient on Eliquis 5mg  twice daily. Will continue. Hypertension - BP mildly elevated at 142/79. - Continue Losartan and Toprol-XL as above. - Will increase HCTZ to 25mg  daily (currently taking every other day). - Will repeat BMET today and then again in 1-2 weeks after increasing HCTZ. Hyperlipidemia - Most recent lipid panel in 05/2020 showed Total Cholesterol of 108, Triglycerides 76, HDL 38.8, LDL 54. - Continue Crestor 20mg  daily. - Labs followed by PCP. History of CVA - Continue Eliquis as CVA may have been related to noncompaction.   Shingrix Covid booster Remined her  to get eye exam annually now that she carries diabetes diagnosis Prenvar 20-we will give today  Eliquis Neurontin HCTZ Plaquenil Losartan Toprol Crestor Prilosec  We discussed her glucose recently- she was concerned about her A1c.  We did a recheck, and she had an A1c of 6.5 for the second time-counseled her that this does mean she has diabetes.  We discussed eating a lower carbohydrate diet, and increasing exercise.  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   Systemic lupus erythematosus ([place]) 08/04/2019   Acute ischemic stroke ([place]) 08/14/2018   HTN (hypertension) 08/14/2018   Controlled type 2 diabetes mellitus without complication, without long-term current use of insulin ([place]) 04/03/2016   History of CVA (cerebrovascular accident) 03/12/2016   Chest pain at rest 03/12/2016   Peripheral edema 03/12/2016    Past Medical History:  Diagnosis Date   Cardiomyopathy ([place])    Hypertension    Lupus ([place])    PUD (peptic ulcer disease)    Stroke ([place]) 08/13/2018   Vertigo     Past Surgical History:  Procedure Laterality Date   BACK SURGERY     CARPAL TUNNEL RELEASE     NECK SURGERY     2015    Social History  Tobacco Use   Smoking status: Former    Pack years: 0.00    Types: Cigarettes    Quit date: 07/21/2004    Years since quitting: 16.5   Smokeless tobacco: Never  Vaping Use   Vaping Use: Never used  Substance Use Topics   Alcohol use: Yes    Comment: occasional   Drug use: No    Family History  Problem Relation Age of Onset   Hypertension Mother    Arthritis Mother    Heart failure Mother    Stroke Mother    Kidney disease Father    Hypertension Father     Allergies  Allergen  Reactions   Codeine Nausea And Vomiting    Medication list has been reviewed and updated.  Current Outpatient Medications on File Prior to Visit  Medication Sig Dispense Refill   acetaminophen (TYLENOL) 325 MG tablet Take 2 tablets (650 mg total) by mouth every 4 (four) hours as needed for mild pain (or temp > 37.5 C (99.5 F)).     ELIQUIS 5 MG TABS tablet TAKE ONE TABLET BY MOUTH AT BREAKFAST AND AT BEDTIME 263 tablet 1   folic acid (FOLVITE) 1 MG tablet Take 1 mg by mouth daily.     gabapentin (NEURONTIN) 600 MG tablet TAKE 1 TABLET BY MOUTH IN THE MORNING AND 1 AT BEDTIME 180 tablet 1   hydrochlorothiazide (HYDRODIURIL) 25 MG tablet Take 1 tablet (25 mg total) by mouth daily. 90 tablet 3   hydroxychloroquine (PLAQUENIL) 200 MG tablet Take 200 mg by mouth 2 (two) times daily.     losartan (COZAAR) 50 MG tablet Take 1 tablet (50 mg total) by mouth daily. 90 tablet 3   meclizine (ANTIVERT) 25 MG tablet TAKE ONE TABLET BY MOUTH twice daily AS NEEDED FOR dizziness 90 tablet 1   methocarbamol (ROBAXIN) 500 MG tablet Take 1 tablet (500 mg total) by mouth every 8 (eight) hours as needed for muscle spasms. 40 tablet 0   Methotrexate Sodium (METHOTREXATE, PF,) 50 MG/2ML injection Inject 0.6 mLs into the muscle once a week.     metoprolol succinate (TOPROL-XL) 25 MG 24 hr tablet Take 1 tablet (25 mg total) by mouth every morning. 90 tablet 0   omeprazole (PRILOSEC) 40 MG capsule Take 1 capsule (40 mg total) by mouth daily. 30 capsule 3   rosuvastatin (CRESTOR) 20 MG tablet Take 1 tablet (20 mg total) by mouth daily. 90 tablet 3   sucralfate (CARAFATE) 1 g tablet TAKE 1 TABLET BY MOUTH 4 TIMES DAILY WITH MEALS AND AT BEDTIME 40 tablet 2   No current facility-administered medications on file prior to visit.    Review of Systems:  As per HPI- otherwise negative.   Physical Examination: Vitals:   01/28/21 1508  BP: 130/78  Pulse: 67  Temp: 99 F (37.2 C)  SpO2: 99%   Vitals:   01/28/21  1508  Weight: 204 lb (92.5 kg)  Height: 5\' 3"  (1.6 m)   Body mass index is 36.14 kg/m. Ideal Body Weight: Weight in (lb) to have BMI = 25: 140.8  GEN: no acute distress. HEENT: Atraumatic, Normocephalic.  Ears and Nose: No external deformity. CV: RRR, No M/G/R. No JVD. No thrill. No extra heart sounds. PULM: CTA B, no wheezes, crackles, rhonchi. No retractions. No resp. distress. No accessory muscle use. ABD: S, NT, ND, +BS. No rebound. No HSM. EXTR: No c/c/e PSYCH: Normally interactive. Conversant.    Assessment and Plan: Controlled type 2 diabetes  mellitus without complication, without long-term current use of insulin ([place]) - Plan: metFORMIN (GLUCOPHAGE) 500 MG tablet, Pneumococcal conjugate vaccine 20-valent (Prevnar 20)  Patient seen today to discuss recent diagnosis of diabetes.  Spent time discussing this with patient today, counseled her that this time she does not need to start routinely checking blood sugars. We will have her start on metformin to 50, increase to 500 after 1 to 2 weeks and well-tolerated.  Counseled that metformin oftentimes causes diarrhea.  We discussed eating a diet low in carbohydrates, higher in vegetables and lean protein  Also encouraged regular exercise  Recommend annual eye exam  Gave Prevnar today  Follow-up 4 to 6 months This visit occurred during the SARS-CoV-2 public health emergency.  Safety protocols were in place, including screening questions prior to the visit, additional usage of staff PPE, and extensive cleaning of exam room while observing appropriate contact time as indicated for disinfecting solutions.   Signed Lamar Blinks, MD

## 2021-01-21 NOTE — Patient Instructions (Addendum)
Good to see you again today!  As we discussed, you do have diabetes but it is not out of control.  Please start metformin, half tablet at bedtime.  If he tolerates this well you can increase to a whole tablet after 1 to 2 weeks.  Work on eating fewer carbohydrates, more vegetables and lean proteins.  Also work on exercise within reason  You should get an annual eye exam  Please see me in 4 to 6 months for follow-up

## 2021-01-24 ENCOUNTER — Other Ambulatory Visit: Payer: Medicare Other

## 2021-01-25 ENCOUNTER — Other Ambulatory Visit (INDEPENDENT_AMBULATORY_CARE_PROVIDER_SITE_OTHER): Payer: Medicare Other

## 2021-01-25 ENCOUNTER — Other Ambulatory Visit: Payer: Self-pay

## 2021-01-25 DIAGNOSIS — R7303 Prediabetes: Secondary | ICD-10-CM | POA: Diagnosis not present

## 2021-01-25 LAB — HEMOGLOBIN A1C: Hgb A1c MFr Bld: 6.5 % (ref 4.6–6.5)

## 2021-01-26 ENCOUNTER — Encounter: Payer: Self-pay | Admitting: Family Medicine

## 2021-01-28 ENCOUNTER — Other Ambulatory Visit: Payer: Self-pay | Admitting: Neurology

## 2021-01-28 ENCOUNTER — Encounter: Payer: Self-pay | Admitting: Family Medicine

## 2021-01-28 ENCOUNTER — Encounter (HOSPITAL_BASED_OUTPATIENT_CLINIC_OR_DEPARTMENT_OTHER): Payer: Medicare Other | Admitting: Cardiovascular Disease

## 2021-01-28 ENCOUNTER — Other Ambulatory Visit: Payer: Self-pay | Admitting: Family Medicine

## 2021-01-28 ENCOUNTER — Ambulatory Visit (INDEPENDENT_AMBULATORY_CARE_PROVIDER_SITE_OTHER): Payer: Medicare Other | Admitting: Family Medicine

## 2021-01-28 ENCOUNTER — Other Ambulatory Visit: Payer: Self-pay | Admitting: Cardiology

## 2021-01-28 ENCOUNTER — Other Ambulatory Visit: Payer: Self-pay

## 2021-01-28 VITALS — BP 130/78 | HR 67 | Temp 99.0°F | Ht 63.0 in | Wt 204.0 lb

## 2021-01-28 DIAGNOSIS — I10 Essential (primary) hypertension: Secondary | ICD-10-CM

## 2021-01-28 DIAGNOSIS — I428 Other cardiomyopathies: Secondary | ICD-10-CM

## 2021-01-28 DIAGNOSIS — E119 Type 2 diabetes mellitus without complications: Secondary | ICD-10-CM | POA: Diagnosis not present

## 2021-01-28 DIAGNOSIS — R072 Precordial pain: Secondary | ICD-10-CM

## 2021-01-28 MED ORDER — METFORMIN HCL 500 MG PO TABS: 250.0000 mg | ORAL_TABLET | Freq: Every day | ORAL | 3 refills | Status: DC

## 2021-01-28 NOTE — Telephone Encounter (Signed)
4f, 92.5kg, scr 0.96 11/23/20, lovw/goodrich 11/23/20

## 2021-01-31 NOTE — Progress Notes (Signed)
NEUROLOGY FOLLOW UP OFFICE NOTE  Amanda Saunders [id number]  Assessment/Plan:   Medication-overuse headache - still using Tylenol daily. 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  She wishes to remain on gabapentin.  I told her that she must limit analgesics to no more than 2 days out of the week Gabapentin 600mg  twice daily Limit use of pain relievers to no more than 2 days out of week to prevent risk of rebound or medication-overuse headache. Secondary stroke prevention as per PCP Follow up 6 months    Subjective:  Amanda Saunders is a 65 year old left-handed woman with history of stroke who follows up for headache.   UPDATE: Always with a persistent frontal headache.  She has headaches where it feels like her head is humming, usually mild but sometimes severe, lasting 15-20 minutes and occurring at least 3 times a week.  Takes Tylenol daily.  Taking the gabapentin 600mg  BID.     Current NSAIDs:  ASA 81mg  Current analgesic:  Tylenol Current muscle relaxant:  Robaxin 500mg  Current antihypertensive:  HCTZ, losartan, Toprol XL Current antiepileptic:  gabapentin 600mg  twice daily Current antihistamine:  Meclizine Other medication:  Eliquis   HISTORY: Headache: On 03/03/19, she developed a sudden severe pounding right sided headache (radiating across forehead) with right facial burning.  There was associated nausea, sometimes vomiting, photophobia and phonophobia.  No visual disturbance.  She felt that her equilibrium was off.  She went to the ED on 03/11/19 for further evaluation. CT head personally reviewed and negative for acute intracranial abnormality such as hemorrhage or infarct.  CBC and BMP were unremarkable.  She was given a headache cocktail of Reglan and Benadryl and discharged home.  Headaches are still present but not as  severe.  She reported temperature of 99.  They lessened on 8/24.  Headache is mild to moderate, lasting a couple of hours off and on, daily.  Sometimes she feels shaking in her head.  Reports a little bit of neck pain.     04/17/2019 MRI w wo and MRA of head: advanced widespread chronic small vessel ischemic changes as well as occlusion of right distal M1 segment and severe stenoses of left M2 branches, A2 branches and mild irregularity of the PCA branches, but no acute intracranial abnormality. 04/13/2019 Sed Rate 11.   History of CVA: She was admitted to [facility] on 08/14/18 for increased left arm numbness and weakness with left sided tremor as well as headache, dizziness with nausea and vomiting.  CT of head was personally reviewed and showed no acute findings.  MRI of brain personally reviewed and demonstrated scattered acute watershed subcortical and small cortical infarcts in the right MCA/ACA and MCA/PCA areas.  MRA of head personally reviewed showed severe stenosis or short segment occlusion of the distal right MCA M1 and proximal M2 segments as well as proximal severe stenosis or short segment occlusion of left MCA M2 segment.  Carotid doppler showed no hemodynamically significant stenosis.  2D echocardiogram showed EF 45-50%.  LDL was 77.  Hgb A1c was 6.2.  ASA 81mg  daily was switched to ASA 325mg  and Plavix 75mg  daily for 3 months with plan to subsequently continue Plavix alone.  She was continued on atorvastatin 40mg  daily.     Past medication:  tramadol, naproxen  PAST MEDICAL HISTORY: Past Medical History:  Diagnosis Date   Cardiomyopathy ([place])  Hypertension    Lupus ([facility])    PUD (peptic ulcer disease)    Stroke ([place]) 08/13/2018   Vertigo     MEDICATIONS: Current Outpatient Medications on File Prior to Visit  Medication Sig Dispense Refill   acetaminophen (TYLENOL) 325 MG tablet Take 2 tablets (650 mg total) by mouth every 4 (four) hours as needed for mild pain (or  temp > 37.5 C (99.5 F)).     ELIQUIS 5 MG TABS tablet TAKE ONE TABLET BY MOUTH AT BREAKFAST AND AT BEDTIME 878 tablet 1   folic acid (FOLVITE) 1 MG tablet Take 1 mg by mouth daily.     gabapentin (NEURONTIN) 600 MG tablet TAKE 1 TABLET BY MOUTH IN THE MORNING AND 1 AT BEDTIME 180 tablet 1   hydrochlorothiazide (HYDRODIURIL) 25 MG tablet Take 1 tablet (25 mg total) by mouth daily. 90 tablet 3   hydroxychloroquine (PLAQUENIL) 200 MG tablet Take 200 mg by mouth 2 (two) times daily.     losartan (COZAAR) 50 MG tablet TAKE ONE TABLET BY MOUTH EVERY MORNING 90 tablet 3   meclizine (ANTIVERT) 25 MG tablet TAKE ONE TABLET BY MOUTH twice daily AS NEEDED FOR dizziness 90 tablet 1   metFORMIN (GLUCOPHAGE) 500 MG tablet Take 0.5-1 tablets (250-500 mg total) by mouth at bedtime. 90 tablet 3   methocarbamol (ROBAXIN) 500 MG tablet Take 1 tablet (500 mg total) by mouth every 8 (eight) hours as needed for muscle spasms. 40 tablet 0   Methotrexate Sodium (METHOTREXATE, PF,) 50 MG/2ML injection Inject 0.6 mLs into the muscle once a week.     metoprolol succinate (TOPROL-XL) 25 MG 24 hr tablet TAKE ONE TABLET BY MOUTH EVERY MORNING 90 tablet 3   omeprazole (PRILOSEC) 40 MG capsule Take 1 capsule (40 mg total) by mouth daily. 30 capsule 3   rosuvastatin (CRESTOR) 20 MG tablet Take 1 tablet (20 mg total) by mouth daily. 90 tablet 3   sucralfate (CARAFATE) 1 g tablet TAKE 1 TABLET BY MOUTH 4 TIMES DAILY WITH MEALS AND AT BEDTIME 40 tablet 2   No current facility-administered medications on file prior to visit.    ALLERGIES: Allergies  Allergen Reactions   Codeine Nausea And Vomiting    FAMILY HISTORY: Family History  Problem Relation Age of Onset   Hypertension Mother    Arthritis Mother    Heart failure Mother    Stroke Mother    Kidney disease Father    Hypertension Father       Objective:  Blood pressure 130/76, pulse 88, height 5\' 3"  (1.6 m), weight 204 lb 2 oz (92.6 kg), SpO2 97 %. General: No  acute distress.  Patient appears well-groomed.   Head:  Normocephalic/atraumatic Eyes:  Fundi examined but not visualized Neck: supple, no paraspinal tenderness, full range of motion Heart:  Regular rate and rhythm Lungs:  Clear to auscultation bilaterally Back: No paraspinal tenderness Neurological Exam: alert and oriented to person, place, and time.  Speech fluent and not dysarthric, language intact.  CN II-XII intact. Bulk and tone normal, muscle strength 5/5 throughout.  Sensation to light touch intact.  Deep tendon reflexes 2+ throughout, toes downgoing.  Finger to nose testing intact.  Gait normal, Romberg negative.   Metta Clines, DO  CC: Lamar Blinks, MD

## 2021-02-01 ENCOUNTER — Other Ambulatory Visit: Payer: Self-pay

## 2021-02-01 ENCOUNTER — Encounter: Payer: Self-pay | Admitting: Neurology

## 2021-02-01 ENCOUNTER — Ambulatory Visit: Payer: Medicare Other | Admitting: Neurology

## 2021-02-01 VITALS — BP 130/76 | HR 88 | Ht 63.0 in | Wt 204.1 lb

## 2021-02-01 DIAGNOSIS — Z8673 Personal history of transient ischemic attack (TIA), and cerebral infarction without residual deficits: Secondary | ICD-10-CM

## 2021-02-01 DIAGNOSIS — G4440 Drug-induced headache, not elsewhere classified, not intractable: Secondary | ICD-10-CM

## 2021-02-01 DIAGNOSIS — E785 Hyperlipidemia, unspecified: Secondary | ICD-10-CM | POA: Diagnosis not present

## 2021-02-01 DIAGNOSIS — I10 Essential (primary) hypertension: Secondary | ICD-10-CM

## 2021-02-01 DIAGNOSIS — R519 Headache, unspecified: Secondary | ICD-10-CM

## 2021-02-01 DIAGNOSIS — I6529 Occlusion and stenosis of unspecified carotid artery: Secondary | ICD-10-CM

## 2021-02-01 NOTE — Patient Instructions (Signed)
Continue gabapentin 600mg  twice daily When you get a headache, try taking the muscle relaxer (methocarbamol) Limit use of pain relievers (like Tylenol, ibuprofen, naproxen, Excedrin) to no more than 2 days out of week to prevent risk of rebound or medication-overuse headache.

## 2021-02-11 ENCOUNTER — Other Ambulatory Visit: Payer: Self-pay | Admitting: Family Medicine

## 2021-02-11 DIAGNOSIS — R1013 Epigastric pain: Secondary | ICD-10-CM

## 2021-02-21 ENCOUNTER — Ambulatory Visit (INDEPENDENT_AMBULATORY_CARE_PROVIDER_SITE_OTHER): Payer: Medicare Other | Admitting: Pharmacist

## 2021-02-21 DIAGNOSIS — I10 Essential (primary) hypertension: Secondary | ICD-10-CM

## 2021-02-21 DIAGNOSIS — E785 Hyperlipidemia, unspecified: Secondary | ICD-10-CM | POA: Diagnosis not present

## 2021-02-21 DIAGNOSIS — E119 Type 2 diabetes mellitus without complications: Secondary | ICD-10-CM | POA: Diagnosis not present

## 2021-02-21 DIAGNOSIS — Z8673 Personal history of transient ischemic attack (TIA), and cerebral infarction without residual deficits: Secondary | ICD-10-CM

## 2021-02-21 NOTE — Patient Instructions (Signed)
[name] It was a pleasure speaking with you today.  I have attached a summary of our visit today and information about your health goals.  Also below are addresses and phone numbers to set up mammogram, diabetic eye exam and dental exam.  If you have any questions or concerns, please feel free to contact me either at the phone number below or with a MyChart message.   Keep up the good work!  Amanda Saunders, PharmD Clinical Pharmacist [facility] [phone] (direct line)  [phone] (main office number) [facility] Address: [address], [place], [place] [address] Phone: [phone]  [place] [phone]  Eye Exam: [facility] Address: [address] [place], [place] [address] Phone: [phone]  [facility] Address: [address], [place], [place] [address] Phone: [phone]  [place], [place], [place] [address] Phone: [phone]   Dentists:  [place] Address: [address] [place], [place], [place] [address] Phone: [phone]  [place] in: [facility] Address: [place], [place], [place] [address] Phone: [phone]  [place] Address: [place], [place], [place] [address] Phone: [phone]  Visit Information  PATIENT GOALS:  Goals Addressed             This [place]   On track    CARE PLAN ENTRY (see longitudinal plan of care for additional care plan information)  Current Barriers:  Chronic Disease Management support, education, and care coordination needs related to Pre-Diabetes, Hypertension, Hx of CVA, Noncompaction Cardiomyopathy, Lupus, Gout, Pain   Hypertension / cardiomyopathy / swelling: BP Readings from Last 3 Encounters:   02/01/21 130/76  01/28/21 130/78  11/23/20 (!) 142/79  Pharmacist Clinical Goal(s): Over the next 180 days, patient will work with PharmD and providers to maintain BP goal <140/90 Current regimen:  Losartan '50mg'$  daily Hydrochlorothiazide '25mg'$  every day (dose increased 11/23/2020) Metoprolol succinate '25mg'$  daily Interventions: Requested patient to check blood pressure 2 times per week and record Discussed weighing daily; patient to consider using [facility] quarterly $40 OTC benefits to purchase scale Patient self care activities - Over the next 180 days, patient will: Check blood pressure 2 times per week, document, and provide at future appointments Purchase scale and check weight daily as recommended by cardiology. Ensure daily salt intake < 2300 mg/day  Hyperlipidemia / history of ischemic stroke Lab Results  Component Value Date/Time   LDLCALC 54 06/08/2020 09:25 AM  Pharmacist Clinical Goal(s): Over the next 180 days, patient will work with PharmD and providers to maintain LDL goal < 70 Current regimen:  Rosuvastatin '20mg'$  daily Eliquis '5mg'$  twice a day Interventions: Review last lipid panel and LDL goal Patient self care activities - Over the next 180 days, patient will: Maintain cholesterol medication regimen.  Diabetes Lab Results  Component Value Date/Time   HGBA1C 6.5 01/25/2021 09:46 AM   HGBA1C 6.3 02/23/2020 09:48 AM  Pharmacist Clinical Goal(s): Over the next 180 days, patient will work with PharmD and providers to maintain A1c goal <6.5% Current regimen:  Metformin '500mg'$  - take 1/2 to 1 tablet daily with evening meal Patient self care activities - Over the next 180 days, patient will: Obtain A1c <6.5% Continue to limit sugar and carbohydrate intake.  Continue  to take metformin '500mg'$  daily with evening meal  Continue as planned to join [facility] Monday 02/25/21 and start water aerobics Recommended getting yearly diabetic eye exam. See  list of area eye specialist above.    Abdominal Pain / history of ulcer: Pharmacist Clinical Goal(s): Over the next 180 days, patient will work with PharmD and providers to improved abdominal pain and reduce risk of recurrence of ulcer Current regimen:  Omeprazole '40mg'$  daily  Sucralfate 1 gram 4 times a day Interventions: Discussed medications that can increase risk of ulcer.  Continue to avoid naprosyn and other over the counter anti-inflammatory medications (Naproxen, ibuprofen, Aleve, Motrin, Advil) Patient self care activities - Over the next 180 days, patient will: Avoid over the counter anti-inflammatory medications Can use Tylenol / acetaminophen for pain, headache, fever if needed (max of '3000mg'$  / day) Continue current regimen   Health Maintenance  Pharmacist Clinical Goal(s) Over the next 180 days, patient will work with PharmD and providers to Remain up to date on health maintenance  Interventions: Recommended patient complete Shingrix vaccine series Discussed getting preventative procedure - eye exam, mammogram and dental exam.  Patient self care activities - Over the next 180 days, patient will: Complete Shingrix vaccine series if agreeable Call for mammogram appointment - [facility] [phone] I have also attached numbers for eye specialist and dentist in your area to call for appointments.   Medication management Pharmacist Clinical Goal(s): Over the next 180 days, patient will work with PharmD and providers to maintain optimal medication adherence Current pharmacy: [facility] Comprehensive medication review performed. When you are back home in [place] we had assist in transfer of medications back to you preferred pharmacy and packaging if needed.  Patient self care activities - Over the next 90 days, patient will: Focus on medication adherence by filling medications appropriately  Take medications as prescribed Report any questions or  concerns to PharmD and/or provider(s)   Patient Goals/Self-Care Activities Over the next 90 days, patient will:  -take medications as prescribed,  -check blood pressure 2 times per week, document, and provide at future appointments, and  -weigh daily, and contact provider if weight gain of more than 3lbs in 24 hours or 5 lbs in 1 week. -Start exercise program - water aerobics with goal to work up to at least 150 minutes of exercise per week.   Please see past updates related to this goal by clicking on the "Past Updates" button in the selected goal          Patient verbalizes understanding of instructions provided today and agrees to view in [place].   Telephone follow up appointment with care management team member scheduled for: 3 to 4 months

## 2021-02-21 NOTE — Chronic Care Management (AMB) (Signed)
Chronic Care Management Pharmacy Note  02/21/2021 Name:  Amanda Saunders MRN:  [id number] DOB:  07-21-1956   Subjective: Amanda Saunders is an 65 y.o. year old female who is a primary patient of Copland, Gay Filler, MD.  The CCM team was consulted for assistance with disease management and care coordination needs.    Engaged with patient by telephone for follow up visit in response to provider referral for pharmacy case management and/or care coordination services.   Consent to Services:  The patient was given information about Chronic Care Management services, agreed to services, and gave verbal consent prior to initiation of services.  Please see initial visit note for detailed documentation.   Patient Care Team: Copland, Gay Filler, MD as PCP - General (Family Medicine) Stanford Breed Denice Bors, MD as PCP - Cardiology (Cardiology) Pieter Partridge, DO as Consulting Physician (Neurology) Rosita Kea, PA-C (Inactive) (Rheumatology) Cherre Robins, PharmD (Pharmacist)  Recent office visits: 01/28/2021 - PCP (Dr Lorelei Pont) seen for type 2 DM (new diagnosis); added metformin 250 to 521m daily at bedtime; Increased gabapentin fro 6038mqd to bid.   Recent consult visits: 02/01/2021 - Neuro (Dr JaTomi Likensf/u headaches; suspects HAs due to medication over use - taking Tylenol daily. Recommended changing gabapentin to antidepressant like nortriptyline but patient declined as she wished to continue wiht gabapentin. Recommended liming anagesics to no more than 2 days per week. 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 BP was slightly elevated at visti. HCTZ increased from 2523mvery other day to 45m54mily. Sleep study ordered due to report of snoring and not feeling well rested in AM  Hospital visits: None in previous 6 months  Objective:  Lab Results  Component Value Date   CREATININE 0.96 11/23/2020   CREATININE 0.93 07/16/2020    CREATININE 0.88 02/23/2020    Lab Results  Component Value Date   HGBA1C 6.5 01/25/2021   Last diabetic Eye exam: No results found for: HMDIABEYEEXA  Last diabetic Foot exam: No results found for: HMDIABFOOTEX      Component Value Date/Time   CHOL 108 06/08/2020 0925   TRIG 76.0 06/08/2020 0925   HDL 38.80 (L) 06/08/2020 0925   CHOLHDL 3 06/08/2020 0925   VLDL 15.2 06/08/2020 0925   LDLCALC 54 06/08/2020 0925    Hepatic Function Latest Ref Rng & Units 07/16/2020 02/23/2020 08/04/2019  Total Protein 6.0 - 8.3 g/dL 7.0 7.1 7.1  Albumin 3.5 - 5.2 g/dL 4.2 4.3 4.4  AST 0 - 37 U/L 26 32 28  ALT 0 - 35 U/L 40(H) 42(H) 40(H)  Alk Phosphatase 39 - 117 U/L 73 80 89  Total Bilirubin 0.2 - 1.2 mg/dL 0.3 0.3 0.3  Bilirubin, Direct 0.0 - 0.3 mg/dL - - -    Lab Results  Component Value Date/Time   TSH 2.68 08/04/2019 11:31 AM   TSH 4.08 02/03/2018 11:20 AM   FREET4 0.75 02/03/2018 11:20 AM    CBC Latest Ref Rng & Units 07/16/2020 02/23/2020 08/04/2019  WBC 4.0 - 10.5 K/uL 6.8 6.1 7.0  Hemoglobin 12.0 - 15.0 g/dL 13.3 13.2 13.2  Hematocrit 36.0 - 46.0 % 40.3 39.7 40.1  Platelets 150.0 - 400.0 K/uL 222.0 208.0 272.0    Lab Results  Component Value Date/Time   VD25OH 25.88 (L) 04/02/2016 12:00 PM    Clinical ASCVD: Yes  The ASCVD Risk score (GofMikey BussingJr., et al., 2013) failed to calculate for  the following reasons:   The patient has a prior MI or stroke diagnosis      Social History   Tobacco Use  Smoking Status Former   Types: Cigarettes   Quit date: 07/21/2004   Years since quitting: 16.6  Smokeless Tobacco Never   BP Readings from Last 3 Encounters:  02/01/21 130/76  01/28/21 130/78  11/23/20 (!) 142/79   Pulse Readings from Last 3 Encounters:  02/01/21 88  01/28/21 67  11/23/20 62   Wt Readings from Last 3 Encounters:  02/01/21 204 lb 2 oz (92.6 kg)  01/28/21 204 lb (92.5 kg)  12/25/20 193 lb (87.5 kg)    Assessment: Review of patient past medical history,  allergies, medications, health status, including review of consultants reports, laboratory and other test data, was performed as part of comprehensive evaluation and provision of chronic care management services.   SDOH:  (Social Determinants of Health) assessments and interventions performed:  SDOH Interventions    Flowsheet Row Most Recent Value  SDOH Interventions   Physical Activity Interventions Other (Comments)  [patient states she will start water aerobics this Monday [facility]/21 at [facility]       Ryan  Allergies  Allergen Reactions   Codeine Nausea And Vomiting    Medications Reviewed Today     Reviewed by Cherre Robins, PharmD (Pharmacist) on 02/21/21 at [place] List Status: <None>   Medication Order Taking? Sig Documenting Provider Last Dose Status Informant  acetaminophen (TYLENOL) 325 MG tablet [id number] Yes Take 2 tablets (650 mg total) by mouth every 4 (four) hours as needed for mild pain (or temp > 37.5 C (99.5 F)).  Patient taking differently: Take 325 mg by mouth as needed for mild pain (or temp > 37.5 C (99.5 F)). Limit to no more than twice per week per neurologist   Domenic Polite, MD Taking Active Self  ELIQUIS 5 MG TABS tablet [id number] Yes TAKE ONE TABLET BY MOUTH AT BREAKFAST AND AT BEDTIME Sande Rives E, PA-C Taking Active   folic acid (FOLVITE) 1 MG tablet [id number] Yes Take 1 mg by mouth daily. [provider] Taking Active Self  gabapentin (NEURONTIN) 600 MG tablet [id number] Yes TAKE 1 TABLET BY MOUTH IN THE MORNING AND 1 AT BEDTIME Copland, Gay Filler, MD Taking Active   hydrochlorothiazide (HYDRODIURIL) 25 MG tablet [id number] Yes Take 1 tablet (25 mg total) by mouth daily. Darreld Mclean, PA-C Taking Active   hydroxychloroquine (PLAQUENIL) 200 MG tablet [id number] Yes Take 200 mg by mouth 2 (two) times daily. [provider] Taking Active Self  losartan (COZAAR) 50 MG tablet [id number] Yes TAKE ONE  TABLET BY MOUTH EVERY MORNING Copland, Gay Filler, MD Taking Active   meclizine (ANTIVERT) 25 MG tablet [id number] Yes TAKE ONE TABLET BY MOUTH twice daily AS NEEDED FOR dizziness Copland, Gay Filler, MD Taking Active   metFORMIN (GLUCOPHAGE) 500 MG tablet [id number] Yes Take 0.5-1 tablets (250-500 mg total) by mouth at bedtime. Copland, Gay Filler, MD Taking Active   methocarbamol (ROBAXIN) 500 MG tablet [id number] Yes Take 1 tablet (500 mg total) by mouth every 8 (eight) hours as needed for muscle spasms. Copland, Gay Filler, MD Taking Active   Methotrexate Sodium (METHOTREXATE, PF,) 50 MG/2ML injection [id number] Yes Inject 0.6 mLs into the muscle once a week. [provider] Taking Active   metoprolol succinate (TOPROL-XL) 25 MG 24 hr tablet [id number] Yes TAKE ONE TABLET BY MOUTH EVERY MORNING Copland, Gay Filler, MD  Taking Active   omeprazole (PRILOSEC) 40 MG capsule [id number] Yes TAKE ONE CAPSULE BY MOUTH DAILY Copland, Gay Filler, MD Taking Active   rosuvastatin (CRESTOR) 20 MG tablet [id number] Yes Take 1 tablet (20 mg total) by mouth daily. 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   Systemic lupus erythematosus ([place]) 08/04/2019   Acute ischemic stroke ([place]) 08/14/2018   HTN (hypertension) 08/14/2018   Controlled type 2 diabetes mellitus without complication, without long-term current use of insulin ([place]) 04/03/2016   History of CVA (cerebrovascular accident) 03/12/2016   Chest pain at rest 03/12/2016   Peripheral edema 03/12/2016    Immunization History  Administered Date(s) Administered   Moderna Sars-Covid-2 Vaccination 11/17/2019, 12/15/2019, 07/06/2020   PNEUMOCOCCAL CONJUGATE-20 01/28/2021   Tdap 03/12/2016     Conditions to be addressed/monitored: HTN, HLD, DMII, and cardiomyopathy; ischemic stroke; lupus; chronic headaches  Care Plan : General Pharmacy (Adult)  Updates made by Cherre Robins, PHARMD since 02/21/2021 12:00 AM     Problem: Medication and Chronic Care Management   Priority: Medium  Onset Date: 11/28/2020  Note:   Current Barriers:  Unable to independently monitor therapeutic efficacy Unable to maintain control of hypertension Chronic Disease Management support, education, and care coordination needs related to Pre-Diabetes, Hypertension, Hx of CVA, Noncompaction Cardiomyopathy, Lupus, Gout, Pain  Pharmacist Clinical Goal(s):  Over the next 90 days, patient will achieve adherence to monitoring guidelines and medication adherence to achieve therapeutic efficacy maintain control of HTN as evidenced by BP < 140/90  adhere to prescribed medication regimen as evidenced by fill history  through collaboration with PharmD and provider.   Interventions: 1:1 collaboration with Copland, Gay Filler, MD regarding development and update of comprehensive plan of care as evidenced by provider attestation and co-signature Inter-disciplinary care team collaboration (see longitudinal plan of care) Comprehensive medication review performed; medication list updated in electronic medical record   Hypertension / cardiomyopathy / swelling: BP has improved with increase in HCTZ in May 2022. Last 2 office BP reading have been at goal;  BP goal <140/90 BP Readings from Last 3 Encounters:  02/01/21 130/76  01/28/21 130/78  11/23/20 (!) 142/79  Current regimen:  Losartan 29m daily Hydrochlorothiazide 223mevery day  Metoprolol succinate 25105maily Home BP readings: has not checked recently Denies dizziness or swelling (LEE improved with increase in HCTZ)  Interventions: Requested patient to check blood pressure 2 times per week and record Discussed weighing daily; patient to consider using  [facility] OTC benefits to purchase scale Ensure daily salt intake < 2300 mg/day Continue current regimen for hypertension  Hyperlipidemia / history of ischemic stroke Controlled; LDL goal < 70 Denies sign and symptoms of bleeding.  Patient reports cost of Eliquis is usually $3.95 and not issues with coverage gap Current regimen:  Rosuvastatin 69m68mily Eliquis 5mg 19mce a day Interventions: Review last lipid panel and LDL goal Recommended maintain cholesterol medication regimen.  Type 2 Diabetes Last A1c has increased to 6.5% - new diagnosis of DM; A1c goal <6.5% Current regimen:  Metformin 500mg 89mke 1/2 to 1 tablet daily at bedtime Just started metformin 01/2021 Not currently checking BG at home. Not really  necessary at this time.  Tolerating metformin well. Denies diarrhea. She does report she is taking metformin with evening meal which I endorsed is recommended to decreased GI side effects.  Patient is limiting intake of sugar containing foods and carbohydrates; Has stopped drinking sodas and has increased intake of non starchy vegetable like tomatoes and cucumbers.  Exercise - about 10 minutes a few days per week but she is planning to join [facility] Monday 02/25/21 and start water aerobics.  Interventions Continue to limit sugar and carbohydrate intake.  Continue to take metformin 538m daily with evening meal [facility] and start water aerobics Discussed A1c goal Recommended getting yearly diabetic eye exam. At patient request will send list of optometrist in her area.   Abdominal Pain / history of ulcer: Patient was advised to avoid using naproxen due to GI issues and also by neurologist due to medication overuse headaches.  [place] Current regimen:  Omeprazole 469mdaily  Sucralfate 1 gram 4 times  a day Interventions: Discussed medications that can increase risk of ulcer.  Recommended she continue to avoid naprosyn and other over the counter anti-inflammatory medications (Naproxen, ibuprofen, Aleve, Motrin, Advil) Can use Tylenol / acetaminophen for pain, headache, fever if needed (max of 300052m day) Continue current regimen   Headaches: Improving: Goal: decrease frequency of headaches At last visit Dr JafTomi Likenseuro) recommended limiting Tylenol use to 2 or fewer doses per week to prevent rebound headaches Dr JafTomi Likensso recommended trial of nortriptyline in place of gabapentin for HA prevention but patient declined.  Current regimen:  Tylenol - as needed; limit to no more than 2 doses per week Gabapentin 600m6mch morning and at bedtime Methocarbamol 500mg20make 1 tablet every 8 hours if needed for muscle spasms / headache Interventions: Reviewed recommendation from Dr JaffeGeorgie Chard visit Discussed pros and cons of nortriptyline trial - patient declines at this time as headaches improving. Patient self care activities - Over the next 180 days, patient will: Can use Tylenol / acetaminophen up to 2 times per week per Dr JaffeTomi Likensinue other medications for headache prevention / treatment  SLE / RA:  [facility] and slightly elevated ALT when last CMP checked 12/2/72021 Current regimen:  Hydroxychloroquine 200mg 88me a day Methotrexate 0.6 milliliter intramuscularly once per week Folic acid 1mg da65m  Medications tried in past: Orencia injection - no effective Avoid TFN's due to cardiomyopathy   Avoid JAK inhibitors due to history of ischemic stroke Interventions: (addressed at previous visit)  Updated medication list and verified current therapy Recommended she make follow up with rheumatologist for labs (methotrexate can increase LFTs and cause myelosuppression)  Medication management Pharmacist Clinical  Goal(s): Over the next 180 days, patient will work with PharmD and providers to maintain optimal medication adherence [facility] Comprehensive medication review performed. Continue to use current pharmacy for medication synchronizing and packaging.  Patient Goals/Self-Care Activities Over the next 90 days, patient will:  take medications as prescribed,  check blood pressure 2 times per week, document, and provide at future appointments, and  weigh daily, and contact provider if weight gain of more than 3lbs in 24 hours or 5 lbs in 1 week. Start exercise program - water aerobics with goal to work up to at least 150 minutes of exercise per  week.   Follow Up Plan: Telephone follow up appointment with care management team member scheduled for:  3 months      Medication Assistance: None required.  Patient affirms current coverage meets needs.  Patient's preferred pharmacy is:  [facility] - [place], [place] - [address] [place] [address] Phone: [phone] Fax: [phone]  [place], [place] - [address] [place] [address] Phone: [phone] Fax: [phone]   Follow Up:  Patient agrees to Care Plan and Follow-up.  Plan: Telephone follow up appointment with care management team member scheduled for:  3 to 4 months  Cherre Robins, PharmD Clinical Pharmacist [facility]

## 2021-02-27 NOTE — Progress Notes (Signed)
HPI: FU cardiomyopathy. Nuclear study November 2017 showed no infarct or ischemia.  LV function mildly reduced.  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 Had CVA January 2020. Carotid Dopplers January 2020 showed 1 to 39% bilateral stenosis. Cardiac MRI 8/20 showed EF 44 and findings c/w noncompaction, mild LAE, mild AI and MR.  Echocardiogram repeated August 2021.  LV function reduced with ejection fraction 40 to 45%.  Since last seen, she has dyspnea with more vigorous activities.  No orthopnea, PND, exertional chest pain or syncope.  Occasional minimal pedal edema.  Current Outpatient Medications  Medication Sig Dispense Refill   diclofenac Sodium (VOLTAREN) 1 % GEL Apply 4 g topically 4 (four) times daily as needed. 500 g 6   ELIQUIS 5 MG TABS tablet TAKE ONE TABLET BY MOUTH AT BREAKFAST AND AT BEDTIME [id number] tablet 1   folic acid (FOLVITE) 1 MG tablet Take 1 mg by mouth daily.     gabapentin (NEURONTIN) 600 MG tablet TAKE ONE TABLET BY MOUTH EVERY MORNING and TAKE ONE TABLET BY MOUTH EVERYDAY AT BEDTIME 60 tablet 2   hydrochlorothiazide (HYDRODIURIL) 25 MG tablet Take 1 tablet (25 mg total) by mouth daily. 90 tablet 3   hydroxychloroquine (PLAQUENIL) 200 MG tablet Take 200 mg by mouth 2 (two) times daily.     losartan (COZAAR) 50 MG tablet TAKE ONE TABLET BY MOUTH EVERY MORNING 90 tablet 3   meclizine (ANTIVERT) 25 MG tablet TAKE ONE TABLET BY MOUTH twice daily AS NEEDED FOR dizziness 90 tablet 1   metFORMIN (GLUCOPHAGE) 500 MG tablet Take 0.5-1 tablets (250-500 mg total) by mouth at bedtime. 90 tablet 3   methocarbamol (ROBAXIN) 500 MG tablet Take 1 tablet (500 mg total) by mouth every 8 (eight) hours as needed for muscle spasms. 40 tablet 0   metoprolol succinate (TOPROL-XL) 25 MG 24 hr tablet TAKE ONE TABLET BY MOUTH EVERY MORNING 90 tablet 3   omeprazole (PRILOSEC) 40  MG capsule TAKE ONE CAPSULE BY MOUTH DAILY 30 capsule 3   rosuvastatin (CRESTOR) 20 MG tablet Take 1 tablet (20 mg total) by mouth daily. 90 tablet 3   sucralfate (CARAFATE) 1 g tablet TAKE 1 TABLET BY MOUTH 4 TIMES DAILY WITH MEALS AND AT BEDTIME 40 tablet 2   No current facility-administered medications for this visit.     Past Medical History:  Diagnosis Date   Cardiomyopathy ([place])    Hypertension    Lupus ([place])    PUD (peptic ulcer disease)    Stroke ([place]) 08/13/2018   Vertigo     Past Surgical History:  Procedure Laterality Date   BACK SURGERY     CARPAL TUNNEL RELEASE     NECK SURGERY     2015    Social History   Socioeconomic History   Marital status: Single    Spouse name: Not on file   Number of children: 1   Years of education: 12   Highest education level: Not on file  Occupational History   Occupation: retired    Comment: retired  Tobacco Use   Smoking status: Former    Types: Cigarettes    Quit date: 07/21/2004    Years since quitting: 16.6   Smokeless tobacco: Never  Vaping Use   Vaping Use: Never used  Substance and Sexual Activity   Alcohol use: Yes    Comment: occasional   Drug use: No  Sexual activity: Not on file  Other Topics Concern   Not on file  Social History Narrative   Lives one level with son/grandchildren; Left handed; high school grad; retired; walks for exercise; no caffeine   Social Determinants of Radio broadcast assistant Strain: Low Risk    Difficulty of Paying Living Expenses: Not hard at all  Food Insecurity: No Food Insecurity   Worried About Charity fundraiser in the Last Year: Never true   Arboriculturist in the Last Year: Never true  Transportation Needs: No Transportation Needs   Lack of Transportation (Medical): No   Lack of Transportation (Non-Medical): No  Physical Activity: Insufficiently Active   Days of Exercise per Week: 2 days   Minutes of Exercise per Session: 10 min  Stress: No Stress Concern  Present   Feeling of Stress : Not at all  Social Connections: Moderately Isolated   Frequency of Communication with Friends and Family: More than three times a week   Frequency of Social Gatherings with Friends and Family: More than three times a week   Attends Religious Services: More than 4 times per year   Active Member of [employer] or Organizations: No   Attends Music therapist: Never   Marital Status: Never married  Human resources officer Violence: Not At Risk   Fear of Current or Ex-Partner: No   Emotionally Abused: No   Physically Abused: No   Sexually Abused: No    Family History  Problem Relation Age of Onset   Hypertension Mother    Arthritis Mother    Heart failure Mother    Stroke Mother    Kidney disease Father    Hypertension Father     ROS: Knee pain but no fevers or chills, productive cough, hemoptysis, dysphasia, odynophagia, melena, hematochezia, dysuria, hematuria, rash, seizure activity, orthopnea, PND, pedal edema, claudication. Remaining systems are negative.  Physical Exam: Well-developed well-nourished in no acute distress.  Skin is warm and dry.  HEENT is normal.  Neck is supple.  Chest is clear to auscultation with normal expansion.  Cardiovascular exam is regular rate and rhythm.  Abdominal exam nontender or distended. No masses palpated. Extremities show no edema. neuro grossly intact  A/P  1 noncompaction-LV function has been mildly reduced previously.  We will continue ARB and beta-blocker.  She has some dyspnea on exertion that does not appear to be particularly limiting.  Repeat echocardiogram.  She had a CVA previously which may have been related to intracranial vascular disease but could also have been related to noncompaction.  Continue apixaban.  Check hgb and renal function. Note she does not have a history of syncope or family history of sudden cardiac death.  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  Continue ARB and  beta-blocker.  No evidence of CHF on examination.  Repeat echocardiogram.  3 prior CVA-continue apixaban.  4 hyperlipidemia-continue statin.  Check lipids and liver.  Kirk Ruths, MD

## 2021-03-05 DIAGNOSIS — R945 Abnormal results of liver function studies: Secondary | ICD-10-CM | POA: Diagnosis not present

## 2021-03-05 DIAGNOSIS — M329 Systemic lupus erythematosus, unspecified: Secondary | ICD-10-CM | POA: Diagnosis not present

## 2021-03-05 DIAGNOSIS — M0609 Rheumatoid arthritis without rheumatoid factor, multiple sites: Secondary | ICD-10-CM | POA: Diagnosis not present

## 2021-03-06 ENCOUNTER — Other Ambulatory Visit: Payer: Self-pay | Admitting: Neurology

## 2021-03-06 ENCOUNTER — Encounter: Payer: Self-pay | Admitting: Family Medicine

## 2021-03-06 DIAGNOSIS — M25562 Pain in left knee: Secondary | ICD-10-CM

## 2021-03-06 DIAGNOSIS — G8929 Other chronic pain: Secondary | ICD-10-CM

## 2021-03-08 ENCOUNTER — Encounter: Payer: Self-pay | Admitting: Family Medicine

## 2021-03-08 ENCOUNTER — Ambulatory Visit: Payer: Self-pay

## 2021-03-08 ENCOUNTER — Other Ambulatory Visit: Payer: Self-pay

## 2021-03-08 ENCOUNTER — Ambulatory Visit: Payer: Medicare Other | Admitting: Family Medicine

## 2021-03-08 DIAGNOSIS — M25561 Pain in right knee: Secondary | ICD-10-CM

## 2021-03-08 DIAGNOSIS — G8929 Other chronic pain: Secondary | ICD-10-CM

## 2021-03-08 DIAGNOSIS — M25562 Pain in left knee: Secondary | ICD-10-CM | POA: Diagnosis not present

## 2021-03-08 MED ORDER — DICLOFENAC SODIUM 1 % EX GEL: 4.0000 g | Freq: Four times a day (QID) | CUTANEOUS | 6 refills | Status: DC | PRN

## 2021-03-08 NOTE — Progress Notes (Signed)
Emailed order for bilateral knee OA medial unloading braces, along with ov note, & demographics/insurance info to [employer] with [employer].

## 2021-03-08 NOTE — Progress Notes (Signed)
Pain for a couple of months No known injuries Cant take tylenol Tried sons tramadol No injections No therapy No previous sx Left is worse than right

## 2021-03-08 NOTE — Patient Instructions (Signed)
Glucosamine sulfate:  1,000-1,500 mg twice daily

## 2021-03-08 NOTE — Progress Notes (Signed)
Office Visit Note   Patient: Amanda Saunders           Date of Birth: July 31, 1955           MRN: [id number] Visit Date: 03/08/2021 Requested by: Amanda Mclean, MD [address],  [place] [id number] PCP: Amanda Mclean, MD  Subjective: Chief Complaint  Patient presents with   Right Knee - Pain   Left Knee - Pain    HPI: She is here with bilateral knee pain.  Left 1 hurts more than the right.  Symptoms for the past couple months, no injury.  They have started intermittently giving her sharp stabbing pains especially on the medial aspect of the left knee but all over the right knee.  She has tried tramadol and Tylenol with no improvement.  She purchased an over-the-counter knee sleeve with some improvement.  She is status post arthroscopic debridement of the left knee 4 years ago for a meniscus tear and tricompartmental DJD.  She has done pretty well until now.                ROS:   All other systems were reviewed and are negative.  Objective: Vital Signs: There were no vitals taken for this visit.  Physical Exam:  General:  Alert and oriented, in no acute distress. Pulm:  Breathing unlabored. Psy:  Normal mood, congruent affect.  Knees: She has 1+ patellofemoral crepitus in both knees.  No effusion today.  No warmth or erythema.  She has full active extension, no contracture.  Both knees are tender on the medial joint line and she has pseudolaxity with valgus stress.  No palpable click with McMurray's.    Imaging: XR Knee 1-2 Views Left  Result Date: 03/08/2021 X-rays of the left knee reveal moderate to severe medial compartment joint space narrowing and periarticular spurring.  Moderate patellofemoral spurring.  No obvious loose body, no fracture or neoplasm seen.  XR Knee 1-2 Views Right  Result Date: 03/08/2021 X-rays of the right knee reveal moderate medial compartment degenerative change with joint space narrowing and periarticular  spurring.  There is moderate patellofemoral spurring as well.  No sign of stress fracture or neoplasm.     Assessment & Plan: Bilateral knee osteoarthritis, primarily medial and patellofemoral compartments -We discussed various options.  She cannot take NSAIDs due to chronic kidney disease.  She will try Voltaren gel topically and glucosamine.  Straight leg raises for strengthening.  We will request medial compartment unloading braces.  If symptoms persist, could try cortisone injections or get approval for gel injections.     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   Systemic lupus erythematosus ([place]) 08/04/2019   Acute ischemic stroke ([place]) 08/14/2018   HTN (hypertension) 08/14/2018   Controlled type 2 diabetes mellitus without complication, without long-term current use of insulin ([place]) 04/03/2016   History of CVA (cerebrovascular accident) 03/12/2016   Chest pain at rest 03/12/2016   Peripheral edema 03/12/2016   Past Medical History:  Diagnosis Date   Cardiomyopathy ([place])    Hypertension    Lupus ([place])    PUD (peptic ulcer disease)    Stroke ([place]) 08/13/2018   Vertigo     Family History  Problem Relation Age of Onset   Hypertension Mother    Arthritis Mother    Heart failure  Mother    Stroke Mother    Kidney disease Father    Hypertension Father     Past Surgical History:  Procedure Laterality Date   BACK SURGERY     CARPAL TUNNEL RELEASE     NECK SURGERY     2015   Social History   Occupational History   Occupation: retired    Comment: retired  Tobacco Use   Smoking status: Former    Types: Cigarettes    Quit date: 07/21/2004    Years since quitting: 16.6   Smokeless tobacco: Never  Vaping Use   Vaping Use: Never used  Substance and Sexual Activity   Alcohol use: Yes    Comment: occasional   Drug use: No   Sexual activity: Not  on file

## 2021-03-11 ENCOUNTER — Other Ambulatory Visit: Payer: Self-pay

## 2021-03-11 ENCOUNTER — Ambulatory Visit: Payer: Medicare Other | Admitting: Cardiology

## 2021-03-11 ENCOUNTER — Encounter: Payer: Self-pay | Admitting: Cardiology

## 2021-03-11 VITALS — BP 126/74 | HR 72 | Ht 63.0 in | Wt 200.2 lb

## 2021-03-11 DIAGNOSIS — Z8673 Personal history of transient ischemic attack (TIA), and cerebral infarction without residual deficits: Secondary | ICD-10-CM | POA: Diagnosis not present

## 2021-03-11 DIAGNOSIS — E785 Hyperlipidemia, unspecified: Secondary | ICD-10-CM | POA: Diagnosis not present

## 2021-03-11 DIAGNOSIS — R945 Abnormal results of liver function studies: Secondary | ICD-10-CM | POA: Diagnosis not present

## 2021-03-11 DIAGNOSIS — I428 Other cardiomyopathies: Secondary | ICD-10-CM

## 2021-03-11 DIAGNOSIS — M0609 Rheumatoid arthritis without rheumatoid factor, multiple sites: Secondary | ICD-10-CM | POA: Diagnosis not present

## 2021-03-11 DIAGNOSIS — M329 Systemic lupus erythematosus, unspecified: Secondary | ICD-10-CM | POA: Diagnosis not present

## 2021-03-11 NOTE — Patient Instructions (Signed)
TESTING-   Your physician has requested that you have an echocardiogram. Echocardiography is a painless test that uses sound waves to create images of your heart. It provides your doctor with information about the size and shape of your heart and how well your heart's chambers and valves are working. This procedure takes approximately one hour. There are no restrictions for this procedure. [place]   Follow-Up: At [facility], you and your health needs are our priority.  As part of our continuing mission to provide you with exceptional heart care, we have created designated Provider Care Teams.  These Care Teams include your primary Cardiologist (physician) and Advanced Practice Providers (APPs -  Physician Assistants and Nurse Practitioners) who all work together to provide you with the care you need, when you need it.  We recommend signing up for the patient portal called "MyChart".  Sign up information is provided on this After Visit Summary.  MyChart is used to connect with patients for Virtual Visits (Telemedicine).  Patients are able to view lab/test results, encounter notes, upcoming appointments, etc.  Non-urgent messages can be sent to your provider as well.   To learn more about what you can do with MyChart, go to [email or web address].    Your next appointment:   12 month(s)  The format for your next appointment:   In Person  Provider:   Kirk Ruths, MD

## 2021-03-12 ENCOUNTER — Encounter: Payer: Self-pay | Admitting: *Deleted

## 2021-03-12 ENCOUNTER — Encounter: Payer: Self-pay | Admitting: Cardiology

## 2021-03-12 LAB — CBC
Hematocrit: 40.5 % (ref 34.0–46.6)
Hemoglobin: 13.1 g/dL (ref 11.1–15.9)
MCH: 28 pg (ref 26.6–33.0)
MCHC: 32.3 g/dL (ref 31.5–35.7)
MCV: 87 fL (ref 79–97)
Platelets: 225 10*3/uL (ref 150–450)
RBC: 4.68 x10E6/uL (ref 3.77–5.28)
RDW: 14 % (ref 11.7–15.4)
WBC: 6.8 10*3/uL (ref 3.4–10.8)

## 2021-03-12 LAB — COMPREHENSIVE METABOLIC PANEL
ALT: 41 IU/L — ABNORMAL HIGH (ref 0–32)
AST: 36 IU/L (ref 0–40)
Albumin/Globulin Ratio: 1.6 (ref 1.2–2.2)
Albumin: 4.6 g/dL (ref 3.8–4.8)
Alkaline Phosphatase: 80 IU/L (ref 44–121)
BUN/Creatinine Ratio: 20 (ref 12–28)
BUN: 19 mg/dL (ref 8–27)
Bilirubin Total: <0.2 mg/dL (ref 0.0–1.2)
CO2: 24 mmol/L (ref 20–29)
Calcium: 9.2 mg/dL (ref 8.7–10.3)
Chloride: 102 mmol/L (ref 96–106)
Creatinine, Ser: 0.94 mg/dL (ref 0.57–1.00)
Globulin, Total: 2.9 g/dL (ref 1.5–4.5)
Glucose: 87 mg/dL (ref 65–99)
Potassium: 4.7 mmol/L (ref 3.5–5.2)
Sodium: 144 mmol/L (ref 134–144)
Total Protein: 7.5 g/dL (ref 6.0–8.5)
eGFR: 68 mL/min/{1.73_m2} (ref >59)

## 2021-03-12 LAB — LIPID PANEL
Chol/HDL Ratio: 3 ratio (ref 0.0–4.4)
Cholesterol, Total: 125 mg/dL (ref 100–199)
HDL: 42 mg/dL (ref >39)
LDL Chol Calc (NIH): 66 mg/dL (ref 0–99)
Triglycerides: 88 mg/dL (ref 0–149)
VLDL Cholesterol Cal: 17 mg/dL (ref 5–40)

## 2021-03-12 NOTE — Telephone Encounter (Signed)
Patient returning call for lab results. 

## 2021-03-12 NOTE — Telephone Encounter (Signed)
This encounter was created in error - please disregard.

## 2021-03-26 ENCOUNTER — Encounter (HOSPITAL_BASED_OUTPATIENT_CLINIC_OR_DEPARTMENT_OTHER): Payer: Medicare Other | Admitting: Cardiovascular Disease

## 2021-03-28 ENCOUNTER — Other Ambulatory Visit: Payer: Self-pay | Admitting: Family Medicine

## 2021-03-28 ENCOUNTER — Telehealth: Payer: Self-pay | Admitting: Family Medicine

## 2021-03-28 DIAGNOSIS — R1013 Epigastric pain: Secondary | ICD-10-CM

## 2021-03-28 NOTE — Telephone Encounter (Signed)
Pt calling to get an update on her braces that were ordered for her. She has not heard back from anyone and wanted to know if there was something on her end that she needed to do on her end. The best call back number is [phone].

## 2021-03-28 NOTE — Telephone Encounter (Signed)
I called the patient back after receiving the email from Amanda Saunders -- he and his partner, Amanda Saunders, had both tried contacting the patient by phone and text. The patient said she did not receive these. I gave the patient Amanda Saunders's phone [phone]) per Amanda Saunders, and advised her to give Amanda Saunders a call.

## 2021-03-28 NOTE — Telephone Encounter (Signed)
I called the patient to let her know I have contacted Thurmond Butts (email) with [employer] to check on this. I asked him to either call the patient, or let me know what I need to tell her. Awaiting his response.

## 2021-04-02 ENCOUNTER — Other Ambulatory Visit: Payer: Self-pay

## 2021-04-02 ENCOUNTER — Ambulatory Visit (HOSPITAL_COMMUNITY): Payer: Medicare Other | Attending: Internal Medicine

## 2021-04-02 DIAGNOSIS — I428 Other cardiomyopathies: Secondary | ICD-10-CM | POA: Insufficient documentation

## 2021-04-02 LAB — ECHOCARDIOGRAM COMPLETE
Area-P 1/2: 3.91 cm2
S' Lateral: 3.5 cm

## 2021-04-02 MED ORDER — PERFLUTREN LIPID MICROSPHERE
1.0000 mL | INTRAVENOUS | Status: AC | PRN
  Administered 2021-04-02: 1 mL via INTRAVENOUS

## 2021-04-08 DIAGNOSIS — M329 Systemic lupus erythematosus, unspecified: Secondary | ICD-10-CM | POA: Diagnosis not present

## 2021-04-11 ENCOUNTER — Ambulatory Visit: Payer: Medicare Other | Admitting: Neurology

## 2021-04-12 ENCOUNTER — Encounter: Payer: Self-pay | Admitting: Family Medicine

## 2021-04-12 MED ORDER — TRAMADOL HCL 50 MG PO TABS: 50.0000 mg | ORAL_TABLET | Freq: Three times a day (TID) | ORAL | 0 refills | Status: DC | PRN

## 2021-04-22 DIAGNOSIS — M329 Systemic lupus erythematosus, unspecified: Secondary | ICD-10-CM | POA: Diagnosis not present

## 2021-04-30 ENCOUNTER — Ambulatory Visit: Payer: Medicare Other | Admitting: Orthopaedic Surgery

## 2021-05-01 ENCOUNTER — Telehealth: Payer: Self-pay | Admitting: Pharmacist

## 2021-05-01 ENCOUNTER — Telehealth: Payer: Self-pay

## 2021-05-01 ENCOUNTER — Encounter: Payer: Self-pay | Admitting: Orthopaedic Surgery

## 2021-05-01 ENCOUNTER — Ambulatory Visit (INDEPENDENT_AMBULATORY_CARE_PROVIDER_SITE_OTHER): Payer: Medicare Other | Admitting: Orthopaedic Surgery

## 2021-05-01 ENCOUNTER — Ambulatory Visit: Payer: Medicare Other | Admitting: Family Medicine

## 2021-05-01 ENCOUNTER — Other Ambulatory Visit: Payer: Self-pay

## 2021-05-01 DIAGNOSIS — M1712 Unilateral primary osteoarthritis, left knee: Secondary | ICD-10-CM | POA: Diagnosis not present

## 2021-05-01 DIAGNOSIS — M1711 Unilateral primary osteoarthritis, right knee: Secondary | ICD-10-CM

## 2021-05-01 NOTE — Chronic Care Management (AMB) (Signed)
Note marked as Error

## 2021-05-01 NOTE — Telephone Encounter (Signed)
Please submit for bil knee injs

## 2021-05-01 NOTE — Telephone Encounter (Signed)
Noted  

## 2021-05-01 NOTE — Progress Notes (Signed)
Office Visit Note   Patient: Amanda Saunders           Date of Birth: 12/13/1955           MRN: [id number] Visit Date: 05/01/2021              Requested by: Darreld Mclean, MD [address],  [place] [address] PCP: Darreld Mclean, MD   Assessment & Plan: Visit Diagnoses:  1. Primary osteoarthritis of right knee   2. Primary osteoarthritis of left knee     Plan: Impression is bilateral knee osteoarthritis.  She cannot take NSAIDs due to chronic kidney disease.  She will continue to use Voltaren gel and glucosamine.  She was unable to afford medial unloader braces.  We will get approval for Visco injections.  She has tried cortisone injections in the past without significant relief.  This patient is diagnosed with osteoarthritis of the knee(s).    Radiographs show evidence of joint space narrowing, osteophytes, subchondral sclerosis and/or subchondral cysts.  This patient has knee pain which interferes with functional and activities of daily living.    This patient has experienced inadequate response, adverse effects and/or intolerance with conservative treatments such as acetaminophen, NSAIDS, topical creams, physical therapy or regular exercise, knee bracing and/or weight loss.   This patient has experienced inadequate response or has a contraindication to intra articular steroid injections for at least 3 months.   This patient is not scheduled to have a total knee replacement within 6 months of starting treatment with viscosupplementation.  Follow-Up Instructions: No follow-ups on file.   Orders:  No orders of the defined types were placed in this encounter.  No orders of the defined types were placed in this encounter.     Procedures: No procedures performed   Clinical Data: No additional findings.   Subjective: Chief Complaint  Patient presents with   Left Knee - Pain   Right Knee - Pain    HPI  Amanda Saunders is a very pleasant  65 year old female previous patient of Dr. Junius Roads who comes in for bilateral knee pain due to osteoarthritis.  She is interested in getting approval for Visco injections.  Review of Systems   Objective: Vital Signs: There were no vitals taken for this visit.  Physical Exam  Ortho Exam  Bilateral knee exams are unchanged.  Specialty Comments:  No specialty comments available.  Imaging: No results found.   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   Systemic lupus erythematosus ([place]) 08/04/2019   Acute ischemic stroke ([place]) 08/14/2018   HTN (hypertension) 08/14/2018   Controlled type 2 diabetes mellitus without complication, without long-term current use of insulin ([place]) 04/03/2016   History of CVA (cerebrovascular accident) 03/12/2016   Chest pain at rest 03/12/2016   Peripheral edema 03/12/2016   Past Medical History:  Diagnosis Date   Cardiomyopathy ([place])    Hypertension    Lupus ([place])    PUD (peptic ulcer disease)    Stroke ([place]) 08/13/2018   Vertigo     Family History  Problem Relation Age of Onset   Hypertension Mother    Arthritis Mother    Heart failure Mother    Stroke Mother    Kidney disease Father    Hypertension Father     Past Surgical History:  Procedure Laterality Date   BACK SURGERY     CARPAL TUNNEL RELEASE  NECK SURGERY     2015   Social History   Occupational History   Occupation: retired    Comment: retired  Tobacco Use   Smoking status: Former    Types: Cigarettes    Quit date: 07/21/2004    Years since quitting: 16.7   Smokeless tobacco: Never  Vaping Use   Vaping Use: Never used  Substance and Sexual Activity   Alcohol use: Yes    Comment: occasional   Drug use: No   Sexual activity: Not on file

## 2021-05-03 NOTE — Progress Notes (Deleted)
[facility] [address], [place], [place] [address] [phone]  Date:  05/08/2021   Name:  Amanda Saunders   DOB:  [birth year]   MRN:  [id number]  PCP:  Darreld Mclean, MD    Chief Complaint: No chief complaint on file.   History of Present Illness:  Amanda Saunders is a 65 y.o. 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   Systemic lupus erythematosus ([place]) 08/04/2019   Acute ischemic stroke ([place]) 08/14/2018   HTN (hypertension) 08/14/2018   Controlled type 2 diabetes mellitus without complication, without long-term current use of insulin ([place]) 04/03/2016   History of CVA (cerebrovascular accident) 03/12/2016   Chest pain at rest 03/12/2016   Peripheral edema 03/12/2016    Past Medical History:  Diagnosis Date   Cardiomyopathy ([place])    Hypertension    Lupus ([place])    PUD (peptic ulcer disease)    Stroke ([place]) 08/13/2018   Vertigo     Past Surgical History:  Procedure Laterality Date   BACK SURGERY     CARPAL TUNNEL RELEASE     NECK SURGERY     2015    Social History   Tobacco Use   Smoking status: Former    Types: Cigarettes    Quit date: 07/21/2004    Years since quitting: 16.7   Smokeless tobacco: Never  Vaping Use   Vaping Use: Never used  Substance Use Topics   Alcohol use: Yes    Comment: occasional   Drug use: No    Family History  Problem Relation Age of Onset   Hypertension Mother    Arthritis Mother    Heart failure Mother    Stroke Mother    Kidney disease  Father    Hypertension Father     Allergies  Allergen Reactions   Codeine Nausea And Vomiting    Medication list has been reviewed and updated.  Current Outpatient Medications on File Prior to Visit  Medication Sig Dispense Refill   sucralfate (CARAFATE) 1 g tablet TAKE ONE TABLET BY MOUTH FOUR TIMES DAILY with meals AND AT BEDTIME 40 tablet 3   diclofenac Sodium (VOLTAREN) 1 % GEL Apply 4 g topically 4 (four) times daily as needed. 500 g 6   ELIQUIS 5 MG TABS tablet TAKE ONE TABLET BY MOUTH AT BREAKFAST AND AT BEDTIME 564 tablet 1   folic acid (FOLVITE) 1 MG tablet Take 1 mg by mouth daily.     gabapentin (NEURONTIN) 600 MG tablet TAKE ONE TABLET BY MOUTH EVERY MORNING and TAKE ONE TABLET BY MOUTH EVERYDAY AT BEDTIME 60 tablet 2   hydrochlorothiazide (HYDRODIURIL) 25 MG tablet Take 1 tablet (25 mg total) by mouth daily. 90 tablet 3   hydroxychloroquine (PLAQUENIL) 200 MG tablet Take 200 mg by mouth 2 (two) times daily.     losartan (COZAAR) 50 MG tablet TAKE ONE TABLET BY MOUTH EVERY MORNING 90 tablet 3   meclizine (ANTIVERT) 25 MG tablet TAKE ONE TABLET BY MOUTH twice daily  AS NEEDED FOR dizziness 90 tablet 1   metFORMIN (GLUCOPHAGE) 500 MG tablet Take 0.5-1 tablets (250-500 mg total) by mouth at bedtime. 90 tablet 3   methocarbamol (ROBAXIN) 500 MG tablet Take 1 tablet (500 mg total) by mouth every 8 (eight) hours as needed for muscle spasms. 40 tablet 0   metoprolol succinate (TOPROL-XL) 25 MG 24 hr tablet TAKE ONE TABLET BY MOUTH EVERY MORNING 90 tablet 3   omeprazole (PRILOSEC) 40 MG capsule TAKE ONE CAPSULE BY MOUTH DAILY 30 capsule 3   rosuvastatin (CRESTOR) 20 MG tablet Take 1 tablet (20 mg total) by mouth daily. 90 tablet 3   traMADol (ULTRAM) 50 MG tablet Take 1 tablet (50 mg total) by mouth every 8 (eight) hours as needed. 30 tablet 0   No current facility-administered medications on file prior to visit.    Review of Systems:  As per HPI- otherwise  negative.   Physical Examination: There were no vitals filed for this visit. There were no vitals filed for this visit. There is no height or weight on file to calculate BMI. Ideal Body Weight:    GEN: no acute distress. HEENT: Atraumatic, Normocephalic.  Ears and Nose: No external deformity. CV: RRR, No M/G/R. No JVD. No thrill. No extra heart sounds. PULM: CTA B, no wheezes, crackles, rhonchi. No retractions. No resp. distress. No accessory muscle use. ABD: S, NT, ND, +BS. No rebound. No HSM. EXTR: No c/c/e PSYCH: Normally interactive. Conversant.    Assessment and Plan: ***  Signed Lamar Blinks, MD

## 2021-05-06 DIAGNOSIS — M329 Systemic lupus erythematosus, unspecified: Secondary | ICD-10-CM | POA: Diagnosis not present

## 2021-05-08 ENCOUNTER — Ambulatory Visit: Payer: Medicare Other | Admitting: Family Medicine

## 2021-05-08 DIAGNOSIS — E119 Type 2 diabetes mellitus without complications: Secondary | ICD-10-CM

## 2021-05-08 DIAGNOSIS — I10 Essential (primary) hypertension: Secondary | ICD-10-CM

## 2021-05-08 DIAGNOSIS — E785 Hyperlipidemia, unspecified: Secondary | ICD-10-CM

## 2021-05-08 DIAGNOSIS — Z8673 Personal history of transient ischemic attack (TIA), and cerebral infarction without residual deficits: Secondary | ICD-10-CM

## 2021-05-17 ENCOUNTER — Telehealth: Payer: Self-pay | Admitting: Orthopaedic Surgery

## 2021-05-17 NOTE — Telephone Encounter (Signed)
Pt called asking if she was approved for the gel injections? She would like a CB to update her please.   [phone]

## 2021-05-20 ENCOUNTER — Telehealth: Payer: Self-pay

## 2021-05-20 NOTE — Telephone Encounter (Signed)
VOB submitted for Durolane, bilateral knee. Pending BV.

## 2021-05-20 NOTE — Telephone Encounter (Signed)
VOB has been submitted  

## 2021-05-21 ENCOUNTER — Telehealth: Payer: Self-pay

## 2021-05-21 NOTE — Telephone Encounter (Signed)
Approved for Durolane, bilateral knee. [place] Patient will be responsible for 20% OOP. Co-pay of $30.00 No PA required  Appt. 05/29/2021 with Dr. Erlinda Hong

## 2021-05-29 ENCOUNTER — Encounter: Payer: Self-pay | Admitting: Orthopaedic Surgery

## 2021-05-29 ENCOUNTER — Other Ambulatory Visit: Payer: Self-pay

## 2021-05-29 ENCOUNTER — Ambulatory Visit (INDEPENDENT_AMBULATORY_CARE_PROVIDER_SITE_OTHER): Payer: Medicare Other | Admitting: Orthopaedic Surgery

## 2021-05-29 DIAGNOSIS — M170 Bilateral primary osteoarthritis of knee: Secondary | ICD-10-CM

## 2021-05-29 DIAGNOSIS — M1712 Unilateral primary osteoarthritis, left knee: Secondary | ICD-10-CM | POA: Diagnosis not present

## 2021-05-29 DIAGNOSIS — M1711 Unilateral primary osteoarthritis, right knee: Secondary | ICD-10-CM

## 2021-05-29 NOTE — Progress Notes (Signed)
Office Visit Note   Patient: Amanda Saunders           Date of Birth: 10/23/1955           MRN: [id number] Visit Date: 05/29/2021              Requested by: Darreld Mclean, MD [address] [place],  [place] [address] PCP: Darreld Mclean, MD   Assessment & Plan: Visit Diagnoses:  1. Bilateral primary osteoarthritis of knee     Plan: Impression is bilateral knee degenerative joint disease.  Today, we proceeded with bilateral knee Durolane injections.  She tolerated these well.  She will follow-up with [place] as needed.  Call with concerns or questions.  Follow-Up Instructions: Return if symptoms worsen or fail to improve.   Orders:  Orders Placed This Encounter  Procedures   Large Joint Inj: bilateral knee    No orders of the defined types were placed in this encounter.     Procedures: Large Joint Inj: bilateral knee on 05/29/2021 8:28 AM Indications: pain Details: 22 G needle, anterolateral approach Medications (Right): 0.66 mL bupivacaine 0.25 %; 3 mL lidocaine 1 %; 60 mg Sodium Hyaluronate 60 MG/3ML Medications (Left): 0.66 mL bupivacaine 0.25 %; 3 mL lidocaine 1 %; 60 mg Sodium Hyaluronate 60 MG/3ML     Clinical Data: No additional findings.   Subjective: Chief Complaint  Patient presents with   Right Knee - Follow-up   Left Knee - Follow-up    HPI patient is a pleasant 65 year old female with underlying bilateral knee DJD who comes in today for bilateral knee Durolane injections.  She has not previously undergone these injections in the past.     Objective: Vital Signs: There were no vitals taken for this visit.    Ortho Exam stable bilateral knee exam  Specialty Comments:  No specialty comments available.  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   Systemic lupus erythematosus ([place]) 08/04/2019   Acute ischemic stroke  ([place]) 08/14/2018   HTN (hypertension) 08/14/2018   Controlled type 2 diabetes mellitus without complication, without long-term current use of insulin ([place]) 04/03/2016   History of CVA (cerebrovascular accident) 03/12/2016   Chest pain at rest 03/12/2016   Peripheral edema 03/12/2016   Past Medical History:  Diagnosis Date   Cardiomyopathy ([place])    Hypertension    Lupus ([place])    PUD (peptic ulcer disease)    Stroke ([place]) 08/13/2018   Vertigo     Family History  Problem Relation Age of Onset   Hypertension Mother    Arthritis Mother    Heart failure Mother    Stroke Mother    Kidney disease Father    Hypertension Father     Past Surgical History:  Procedure Laterality Date   BACK SURGERY     CARPAL TUNNEL RELEASE     NECK SURGERY     2015   Social History   Occupational History   Occupation: retired    Comment: retired  Tobacco Use   Smoking status: Former    Types: Cigarettes    Quit date: 07/21/2004    Years since quitting: 16.8   Smokeless tobacco: Never  Vaping Use   Vaping Use: Never used  Substance and Sexual Activity   Alcohol use: Yes    Comment: occasional   Drug use: No   Sexual activity:  Not on file

## 2021-05-31 MED ORDER — SODIUM HYALURONATE 60 MG/3ML IX PRSY
60.0000 mg | PREFILLED_SYRINGE | INTRA_ARTICULAR | Status: AC | PRN
  Administered 2021-05-29: 60 mg via INTRA_ARTICULAR

## 2021-05-31 MED ORDER — BUPIVACAINE HCL 0.25 % IJ SOLN
0.6600 mL | INTRAMUSCULAR | Status: AC | PRN
  Administered 2021-05-29: .66 mL via INTRA_ARTICULAR

## 2021-05-31 MED ORDER — LIDOCAINE HCL 1 % IJ SOLN
3.0000 mL | INTRAMUSCULAR | Status: AC | PRN
  Administered 2021-05-29: 3 mL

## 2021-06-01 ENCOUNTER — Encounter (HOSPITAL_BASED_OUTPATIENT_CLINIC_OR_DEPARTMENT_OTHER): Payer: Self-pay | Admitting: Emergency Medicine

## 2021-06-01 ENCOUNTER — Other Ambulatory Visit: Payer: Self-pay

## 2021-06-01 ENCOUNTER — Emergency Department (HOSPITAL_BASED_OUTPATIENT_CLINIC_OR_DEPARTMENT_OTHER)
Admission: EM | Admit: 2021-06-01 | Discharge: 2021-06-01 | Disposition: A | Discharge status: Home / Self Care | Payer: Medicare Other | Attending: Emergency Medicine | Admitting: Emergency Medicine

## 2021-06-01 DIAGNOSIS — U071 COVID-19: Secondary | ICD-10-CM | POA: Insufficient documentation

## 2021-06-01 DIAGNOSIS — Z7901 Long term (current) use of anticoagulants: Secondary | ICD-10-CM | POA: Insufficient documentation

## 2021-06-01 DIAGNOSIS — R509 Fever, unspecified: Secondary | ICD-10-CM | POA: Diagnosis present

## 2021-06-01 DIAGNOSIS — R112 Nausea with vomiting, unspecified: Secondary | ICD-10-CM | POA: Diagnosis not present

## 2021-06-01 DIAGNOSIS — I10 Essential (primary) hypertension: Secondary | ICD-10-CM | POA: Diagnosis not present

## 2021-06-01 DIAGNOSIS — Z87891 Personal history of nicotine dependence: Secondary | ICD-10-CM | POA: Insufficient documentation

## 2021-06-01 DIAGNOSIS — Z79899 Other long term (current) drug therapy: Secondary | ICD-10-CM | POA: Diagnosis not present

## 2021-06-01 LAB — BASIC METABOLIC PANEL
Anion gap: 11 (ref 5–15)
BUN: 13 mg/dL (ref 8–23)
CO2: 28 mmol/L (ref 22–32)
Calcium: 8.9 mg/dL (ref 8.9–10.3)
Chloride: 98 mmol/L (ref 98–111)
Creatinine, Ser: 1.04 mg/dL — ABNORMAL HIGH (ref 0.44–1.00)
GFR, Estimated: >60 mL/min (ref >60)
Glucose, Bld: 110 mg/dL — ABNORMAL HIGH (ref 70–99)
Potassium: 3.5 mmol/L (ref 3.5–5.1)
Sodium: 137 mmol/L (ref 135–145)

## 2021-06-01 LAB — CBC WITH DIFFERENTIAL/PLATELET
Abs Immature Granulocytes: 0.02 10*3/uL (ref 0.00–0.07)
Basophils Absolute: 0 10*3/uL (ref 0.0–0.1)
Basophils Relative: 0 %
Eosinophils Absolute: 0 10*3/uL (ref 0.0–0.5)
Eosinophils Relative: 1 %
HCT: 39.1 % (ref 36.0–46.0)
Hemoglobin: 13 g/dL (ref 12.0–15.0)
Immature Granulocytes: 0 %
Lymphocytes Relative: 6 %
Lymphs Abs: 0.4 10*3/uL — ABNORMAL LOW (ref 0.7–4.0)
MCH: 28.3 pg (ref 26.0–34.0)
MCHC: 33.2 g/dL (ref 30.0–36.0)
MCV: 85 fL (ref 80.0–100.0)
Monocytes Absolute: 0.6 10*3/uL (ref 0.1–1.0)
Monocytes Relative: 10 %
Neutro Abs: 4.9 10*3/uL (ref 1.7–7.7)
Neutrophils Relative %: 83 %
Platelets: 240 10*3/uL (ref 150–400)
RBC: 4.6 MIL/uL (ref 3.87–5.11)
RDW: 14.1 % (ref 11.5–15.5)
WBC: 5.9 10*3/uL (ref 4.0–10.5)
nRBC: 0 % (ref 0.0–0.2)

## 2021-06-01 LAB — RESP PANEL BY RT-PCR (FLU A&B, COVID) ARPGX2
Influenza A by PCR: NEGATIVE
Influenza B by PCR: NEGATIVE
SARS Coronavirus 2 by RT PCR: POSITIVE — AB

## 2021-06-01 MED ORDER — LACTATED RINGERS IV BOLUS
1000.0000 mL | Freq: Once | INTRAVENOUS | Status: AC
  Administered 2021-06-01: 1000 mL via INTRAVENOUS

## 2021-06-01 MED ORDER — METOCLOPRAMIDE HCL 5 MG/ML IJ SOLN
10.0000 mg | Freq: Once | INTRAMUSCULAR | Status: DC | PRN
  Filled 2021-06-01: qty 2

## 2021-06-01 MED ORDER — ONDANSETRON 8 MG PO TBDP: 8.0000 mg | ORAL_TABLET | Freq: Three times a day (TID) | ORAL | 0 refills | Status: DC | PRN

## 2021-06-01 MED ORDER — LAGEVRIO 200 MG PO CAPS: 4.0000 | ORAL_CAPSULE | Freq: Two times a day (BID) | ORAL | 0 refills | Status: AC

## 2021-06-01 MED ORDER — ONDANSETRON HCL 4 MG/2ML IJ SOLN
4.0000 mg | Freq: Once | INTRAMUSCULAR | Status: AC
  Administered 2021-06-01: 4 mg via INTRAVENOUS
  Filled 2021-06-01: qty 2

## 2021-06-01 MED ORDER — ACETAMINOPHEN 650 MG RE SUPP
650.0000 mg | Freq: Once | RECTAL | Status: AC
  Administered 2021-06-01: 650 mg via RECTAL
  Filled 2021-06-01: qty 1

## 2021-06-01 MED ORDER — KETOROLAC TROMETHAMINE 15 MG/ML IJ SOLN
15.0000 mg | Freq: Once | INTRAMUSCULAR | Status: AC
  Administered 2021-06-01: 15 mg via INTRAVENOUS
  Filled 2021-06-01: qty 1

## 2021-06-01 MED ORDER — ACETAMINOPHEN 500 MG PO TABS
1000.0000 mg | ORAL_TABLET | Freq: Once | ORAL | Status: DC | PRN
  Filled 2021-06-01: qty 2

## 2021-06-01 MED ORDER — ACETAMINOPHEN 325 MG PO TABS: 650.0000 mg | ORAL_TABLET | Freq: Once | ORAL | Status: DC | PRN

## 2021-06-01 NOTE — ED Provider Notes (Signed)
[facility] Provider Note: Georgena Spurling, MD, FACEP  CSN: [id number] MRN: [id number] ARRIVAL: 06/01/21 at [place]: [place]  flu like illness   HISTORY OF PRESENT ILLNESS  06/01/21 3:39 AM Amanda Saunders is a 65 y.o. female who recently returned from [place] where she was exposed posed to a family member with flulike symptoms.  She is here with 2 days of symptoms.  Specifically she has had fever (103.1 on arrival), headache, body aches, general malaise, cough, shortness of breath, nasal congestion, nausea and vomiting.  She has not had diarrhea or abdominal pain.  She has not had loss of taste or smell.  She rates her pain level as an 8 out of 10.   Past Medical History:  Diagnosis Date   Cardiomyopathy ([place])    Hypertension    Lupus ([place])    PUD (peptic ulcer disease)    Stroke ([place]) 08/13/2018   Vertigo     Past Surgical History:  Procedure Laterality Date   BACK SURGERY     CARPAL TUNNEL RELEASE     KNEE CARTILAGE SURGERY     NECK SURGERY     2015    Family History  Problem Relation Age of Onset   Hypertension Mother    Arthritis Mother    Heart failure Mother    Stroke Mother    Kidney disease Father    Hypertension Father     Social History   Tobacco Use   Smoking status: Former    Types: Cigarettes    Quit date: 07/21/2004    Years since quitting: 16.8   Smokeless tobacco: Never  Vaping Use   Vaping Use: Never used  Substance Use Topics   Alcohol use: Yes    Comment: occasional   Drug use: No    Prior to Admission medications   Medication Sig Start Date End Date Taking? Authorizing Provider  sucralfate (CARAFATE) 1 g tablet TAKE ONE TABLET BY MOUTH FOUR TIMES DAILY with meals AND AT BEDTIME 03/28/21   Copland, Gay Filler, MD  diclofenac Sodium (VOLTAREN) 1 % GEL Apply 4 g topically 4 (four) times daily as needed. 03/08/21   Hilts, Michael, MD  ELIQUIS 5 MG TABS tablet TAKE ONE TABLET BY MOUTH AT [facility] AND  AT BEDTIME 01/28/21   Sande Rives E, PA-C  folic acid (FOLVITE) 1 MG tablet Take 1 mg by mouth daily. 02/24/19   [provider]  gabapentin (NEURONTIN) 600 MG tablet TAKE ONE TABLET BY MOUTH EVERY MORNING and TAKE ONE TABLET BY MOUTH EVERYDAY AT BEDTIME 03/06/21   Tomi Likens, Adam R, DO  hydrochlorothiazide (HYDRODIURIL) 25 MG tablet Take 1 tablet (25 mg total) by mouth daily. 11/23/20   Darreld Mclean, PA-C  hydroxychloroquine (PLAQUENIL) 200 MG tablet Take 200 mg by mouth 2 (two) times daily. 05/21/18   [provider]  losartan (COZAAR) 50 MG tablet TAKE ONE TABLET BY MOUTH EVERY MORNING 01/28/21   Copland, Gay Filler, MD  meclizine (ANTIVERT) 25 MG tablet TAKE ONE TABLET BY MOUTH twice daily AS NEEDED FOR dizziness 10/10/20   Copland, Gay Filler, MD  metFORMIN (GLUCOPHAGE) 500 MG tablet Take 0.5-1 tablets (250-500 mg total) by mouth at bedtime. 01/28/21   Copland, Gay Filler, MD  methocarbamol (ROBAXIN) 500 MG tablet Take 1 tablet (500 mg total) by mouth every 8 (eight) hours as needed for muscle spasms. 02/23/20   Copland, Gay Filler, MD  metoprolol succinate (TOPROL-XL) 25 MG 24 hr  tablet TAKE ONE TABLET BY MOUTH EVERY MORNING 01/28/21   Copland, Gay Filler, MD  omeprazole (PRILOSEC) 40 MG capsule TAKE ONE CAPSULE BY MOUTH DAILY 02/11/21   Copland, Gay Filler, MD  rosuvastatin (CRESTOR) 20 MG tablet Take 1 tablet (20 mg total) by mouth daily. 10/10/20   Copland, Gay Filler, MD  traMADol (ULTRAM) 50 MG tablet Take 1 tablet (50 mg total) by mouth every 8 (eight) hours as needed. 04/12/21   Copland, Gay Filler, MD    Allergies Codeine   REVIEW OF SYSTEMS  Negative except as noted here or in the History of Present Illness.   PHYSICAL EXAMINATION  Initial Vital Signs Blood pressure 132/67, pulse 97, temperature (!) 103.1 F (39.5 C), temperature source Oral, resp. rate (!) 22, height 5\' 3"  (1.6 m), weight 99 kg, SpO2 95 %.  Examination General: Well-developed, well-nourished female in no  acute distress; appearance consistent with age of record HENT: normocephalic; atraumatic; no pharyngeal erythema or exudate Eyes: pupils equal, round and reactive to light; extraocular muscles intact Neck: supple Heart: regular rate and rhythm Lungs: clear to auscultation bilaterally Abdomen: soft; nondistended; nontender; bowel sounds present Extremities: No deformity; full range of motion; pulses normal Neurologic: Awake, alert and oriented; motor function intact in all extremities and symmetric; no facial droop Skin: Warm and dry Psychiatric: Flat affect   RESULTS  Summary of this visit's results, reviewed and interpreted by myself:   EKG Interpretation  Date/Time:    Ventricular Rate:    PR Interval:    QRS Duration:   QT Interval:    QTC Calculation:   R Axis:     Text Interpretation:         Laboratory Studies: Results for orders placed or performed during the hospital encounter of 06/01/21 (from the past 24 hour(s))  Resp Panel by RT-PCR (Flu A&B, Covid) Nasopharyngeal Swab     Status: Abnormal   Collection Time: 06/01/21  3:47 AM   Specimen: Nasopharyngeal Swab; Nasopharyngeal(NP) swabs in vial transport medium  Result Value Ref Range   SARS Coronavirus 2 by RT PCR POSITIVE (A) NEGATIVE   Influenza A by PCR NEGATIVE NEGATIVE   Influenza B by PCR NEGATIVE NEGATIVE  CBC with Differential/Platelet     Status: Abnormal   Collection Time: 06/01/21  4:03 AM  Result Value Ref Range   WBC 5.9 4.0 - 10.5 K/uL   RBC 4.60 3.87 - 5.11 MIL/uL   Hemoglobin 13.0 12.0 - 15.0 g/dL   HCT 39.1 36.0 - 46.0 %   MCV 85.0 80.0 - 100.0 fL   MCH 28.3 26.0 - 34.0 pg   MCHC 33.2 30.0 - 36.0 g/dL   RDW 14.1 11.5 - 15.5 %   Platelets 240 150 - 400 K/uL   nRBC 0.0 0.0 - 0.2 %   Neutrophils Relative % 83 %   Neutro Abs 4.9 1.7 - 7.7 K/uL   Lymphocytes Relative 6 %   Lymphs Abs 0.4 (L) 0.7 - 4.0 K/uL   Monocytes Relative 10 %   Monocytes Absolute 0.6 0.1 - 1.0 K/uL   Eosinophils  Relative 1 %   Eosinophils Absolute 0.0 0.0 - 0.5 K/uL   Basophils Relative 0 %   Basophils Absolute 0.0 0.0 - 0.1 K/uL   Immature Granulocytes 0 %   Abs Immature Granulocytes 0.02 0.00 - 0.07 K/uL  Basic metabolic panel     Status: Abnormal   Collection Time: 06/01/21  4:03 AM  Result Value Ref Range  Sodium 137 135 - 145 mmol/L   Potassium 3.5 3.5 - 5.1 mmol/L   Chloride 98 98 - 111 mmol/L   CO2 28 22 - 32 mmol/L   Glucose, Bld 110 (H) 70 - 99 mg/dL   BUN 13 8 - 23 mg/dL   Creatinine, Ser 1.04 (H) 0.44 - 1.00 mg/dL   Calcium 8.9 8.9 - 10.3 mg/dL   GFR, Estimated >60 >60 mL/min   Anion gap 11 5 - 15   Imaging Studies: No results found.  ED COURSE and MDM  Nursing notes, initial and subsequent vitals signs, including pulse oximetry, reviewed and interpreted by myself.  Vitals:   06/01/21 0313 06/01/21 0508  BP: 132/67 112/61  Pulse: 97 83  Resp: (!) 22 20  Temp: (!) 103.1 F (39.5 C) (!) 101.1 F (38.4 C)  TempSrc: Oral Oral  SpO2: 95% 94%  Weight: 99 kg   Height: 5\' 3"  (1.6 m)    Medications  metoCLOPramide (REGLAN) injection 10 mg (has no administration in time range)  acetaminophen (TYLENOL) suppository 650 mg (650 mg Rectal Given 06/01/21 0411)  lactated ringers bolus 1,000 mL (1,000 mLs Intravenous New Bag/Given 06/01/21 0400)  ondansetron (ZOFRAN) injection 4 mg (4 mg Intravenous Given 06/01/21 0405)  ketorolac (TORADOL) 15 MG/ML injection 15 mg (15 mg Intravenous Given 06/01/21 0407)   5:07 AM Patient feeling better after IV fluids and medications and would like to go home.  We will her on Lagevrio to avoid drug drug interactions associated with Paxlovid.  PROCEDURES  Procedures   ED DIAGNOSES     ICD-10-CM   1. COVID-19 virus infection  U07.1          Arad Burston, MD 06/01/21 [phone]

## 2021-06-01 NOTE — ED Triage Notes (Signed)
Pt states congestion fever emesis X 2 days. Just got back from [place] and exposed to family member who was sick.

## 2021-06-10 ENCOUNTER — Telehealth: Payer: Self-pay | Admitting: Family Medicine

## 2021-06-10 MED ORDER — HYDROCODONE BIT-HOMATROP MBR 5-1.5 MG/5ML PO SOLN: 5.0000 mL | Freq: Three times a day (TID) | ORAL | 0 refills | Status: AC | PRN

## 2021-06-10 NOTE — Telephone Encounter (Signed)
PT sent this to scheduling pool by mistake   "Good evening Dt. Copeland, I was diagnosed with Covid19 on 06/01/2021; I have taken all the medication that was prescribed for 5 days; I can't take the coughing/chest congestion/slight wheezing; I have coughed so much my chest & shoulders hurt; would you please  call in a prescription for coughing/chest congestion/wheezing ([facility].  Thank you."

## 2021-06-10 NOTE — Telephone Encounter (Signed)
Called pt- her main issue right now is cough She has n/v with codeine but not true allergy She is ok with trying hydrocodone Will call in hycodan for her- she will let me know if not improving soon  Meds ordered this encounter  Medications   HYDROcodone bit-homatropine (HYCODAN) 5-1.5 MG/5ML syrup    Sig: Take 5 mLs by mouth every 8 (eight) hours as needed for up to 5 days for cough.    Dispense:  90 mL    Refill:  0

## 2021-06-10 NOTE — Telephone Encounter (Signed)
Pt was dx at the hospital, does pt need OV?

## 2021-06-11 NOTE — Progress Notes (Deleted)
[facility] at [facility] [address], [place], [place] [address] [phone]  Date:  06/17/2021   Name:  Amanda Saunders   DOB:  [birth year]   MRN:  [id number]  PCP:  Darreld Mclean, MD    Chief Complaint: No chief complaint on file.   History of Present Illness:  Amanda Saunders is a 65 y.o. 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   Systemic lupus erythematosus ([place]) 08/04/2019   Acute ischemic stroke ([place]) 08/14/2018   HTN (hypertension) 08/14/2018   Controlled type 2 diabetes mellitus without complication, without long-term current use of insulin ([place]) 04/03/2016   History of CVA (cerebrovascular accident) 03/12/2016   Chest pain at rest 03/12/2016   Peripheral edema 03/12/2016    Past Medical History:  Diagnosis Date   Cardiomyopathy ([place])    Hypertension    Lupus ([place])    PUD (peptic ulcer disease)    Stroke ([place]) 08/13/2018   Vertigo     Past Surgical History:  Procedure Laterality Date   BACK SURGERY     CARPAL TUNNEL RELEASE     KNEE CARTILAGE SURGERY     NECK SURGERY     2015    Social History   Tobacco Use   Smoking status: Former    Types: Cigarettes    Quit date: 07/21/2004    Years since quitting: 16.9   Smokeless tobacco: Never  Vaping Use   Vaping Use: Never used  Substance Use Topics   Alcohol use: Yes     Comment: occasional   Drug use: No    Family History  Problem Relation Age of Onset   Hypertension Mother    Arthritis Mother    Heart failure Mother    Stroke Mother    Kidney disease Father    Hypertension Father     Allergies  Allergen Reactions   Codeine Nausea And Vomiting    Medication list has been reviewed and updated.  Current Outpatient Medications on File Prior to Visit  Medication Sig Dispense Refill   sucralfate (CARAFATE) 1 g tablet TAKE ONE TABLET BY MOUTH FOUR TIMES DAILY with meals AND AT BEDTIME 40 tablet 3   diclofenac Sodium (VOLTAREN) 1 % GEL Apply 4 g topically 4 (four) times daily as needed. 500 g 6   ELIQUIS 5 MG TABS tablet TAKE ONE TABLET BY MOUTH AT BREAKFAST AND AT BEDTIME 680 tablet 1   folic acid (FOLVITE) 1 MG tablet Take 1 mg by mouth daily.     gabapentin (NEURONTIN) 600 MG tablet TAKE ONE TABLET BY MOUTH EVERY MORNING and TAKE ONE TABLET BY MOUTH EVERYDAY AT BEDTIME 60 tablet 2   hydrochlorothiazide (HYDRODIURIL) 25 MG tablet Take 1 tablet (25 mg total) by mouth daily. 90 tablet 3   HYDROcodone bit-homatropine (HYCODAN) 5-1.5 MG/5ML syrup Take 5 mLs by mouth every 8 (eight)  hours as needed for up to 5 days for cough. 90 mL 0   hydroxychloroquine (PLAQUENIL) 200 MG tablet Take 200 mg by mouth 2 (two) times daily.     losartan (COZAAR) 50 MG tablet TAKE ONE TABLET BY MOUTH EVERY MORNING 90 tablet 3   meclizine (ANTIVERT) 25 MG tablet TAKE ONE TABLET BY MOUTH twice daily AS NEEDED FOR dizziness 90 tablet 1   metFORMIN (GLUCOPHAGE) 500 MG tablet Take 0.5-1 tablets (250-500 mg total) by mouth at bedtime. 90 tablet 3   methocarbamol (ROBAXIN) 500 MG tablet Take 1 tablet (500 mg total) by mouth every 8 (eight) hours as needed for muscle spasms. 40 tablet 0   metoprolol succinate (TOPROL-XL) 25 MG 24 hr tablet TAKE ONE TABLET BY MOUTH EVERY MORNING 90 tablet 3   omeprazole (PRILOSEC) 40 MG capsule TAKE ONE CAPSULE BY MOUTH DAILY 30 capsule 3    ondansetron (ZOFRAN ODT) 8 MG disintegrating tablet Take 1 tablet (8 mg total) by mouth every 8 (eight) hours as needed for nausea or vomiting. 10 tablet 0   rosuvastatin (CRESTOR) 20 MG tablet Take 1 tablet (20 mg total) by mouth daily. 90 tablet 3   traMADol (ULTRAM) 50 MG tablet Take 1 tablet (50 mg total) by mouth every 8 (eight) hours as needed. 30 tablet 0   No current facility-administered medications on file prior to visit.    Review of Systems:  As per HPI- otherwise negative.   Physical Examination: There were no vitals filed for this visit. There were no vitals filed for this visit. There is no height or weight on file to calculate BMI. Ideal Body Weight:    GEN: no acute distress. HEENT: Atraumatic, Normocephalic.  Ears and Nose: No external deformity. CV: RRR, No M/G/R. No JVD. No thrill. No extra heart sounds. PULM: CTA B, no wheezes, crackles, rhonchi. No retractions. No resp. distress. No accessory muscle use. ABD: S, NT, ND, +BS. No rebound. No HSM. EXTR: No c/c/e PSYCH: Normally interactive. Conversant.    Assessment and Plan: ***  Signed Lamar Blinks, MD

## 2021-06-12 ENCOUNTER — Encounter: Payer: Self-pay | Admitting: Family Medicine

## 2021-06-12 ENCOUNTER — Telehealth: Payer: Self-pay

## 2021-06-12 NOTE — Telephone Encounter (Signed)
Please advise 

## 2021-06-12 NOTE — Telephone Encounter (Signed)
Caller states how long does being diagnosed with covid 10 days ago. and wants to know when can booster shot? Location confirmed. caller is having coughing and chest congestion.  Telephone: [phone]

## 2021-06-12 NOTE — Telephone Encounter (Signed)
Pt aware and voices understanding. -she is taking Mucinex and is starting to turn the corner.   She does mention that her IV infusion for Lupous is in December and asks is she okay to get that as planned? Will her having covid push this off at all?

## 2021-06-12 NOTE — Telephone Encounter (Signed)
Ok-I did not quite understand the first message.  Please give her a call -   If I understand correctly, she was diagnosed with COVID 19 10 days ago?  And she is having some cough and chest congestion still?  As long as she is making progress and getting better the cough and congestion should clear up soon  She can get the booster shot whenever she would like, although I would wait until she is completely well  Exception would be if she was treated with IV antibody infusion for COVID-19, in that case she would want to wait 3 months before her booster  However I believe she was seen in the ER on 11/12 and was given oral antivirals for COVID-19  Thank you!

## 2021-06-17 ENCOUNTER — Ambulatory Visit: Payer: Medicare Other | Admitting: Family Medicine

## 2021-06-17 DIAGNOSIS — E785 Hyperlipidemia, unspecified: Secondary | ICD-10-CM

## 2021-06-17 DIAGNOSIS — E119 Type 2 diabetes mellitus without complications: Secondary | ICD-10-CM

## 2021-06-17 DIAGNOSIS — Z8673 Personal history of transient ischemic attack (TIA), and cerebral infarction without residual deficits: Secondary | ICD-10-CM

## 2021-06-17 DIAGNOSIS — I10 Essential (primary) hypertension: Secondary | ICD-10-CM

## 2021-06-20 DIAGNOSIS — Z79899 Other long term (current) drug therapy: Secondary | ICD-10-CM | POA: Diagnosis not present

## 2021-06-20 DIAGNOSIS — M329 Systemic lupus erythematosus, unspecified: Secondary | ICD-10-CM | POA: Diagnosis not present

## 2021-06-25 ENCOUNTER — Telehealth: Payer: Medicare Other

## 2021-06-25 NOTE — Progress Notes (Addendum)
[facility] [address], [place], [place] [address] [phone]  Date:  06/26/2021   Name:  Amanda Saunders   DOB:  1955/12/05   MRN:  [id number]  PCP:  Darreld Mclean, MD    Chief Complaint: Fever (Pt c/o running a low grade fever/ other personal issue x 1 Friday. Pain with urination. No nausea, low abd pain or back pain. Pt would like STD testing.)   History of Present Illness:  Amanda Saunders is a 65 y.o. 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  We will continue ARB and beta-blocker.  She has some dyspnea on exertion that does not appear to be particularly limiting.  Repeat echocardiogram.  She had a CVA previously which may have been related to intracranial vascular disease but could also have been related to noncompaction.  Continue apixaban.  Check hgb and renal function. Note she does not have a history of syncope or family history of sudden cardiac death. 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  Continue ARB and beta-blocker.  No evidence of CHF on examination.  Repeat echocardiogram. 3 prior CVA-continue apixaban. 4 hyperlipidemia-continue statin.  Check lipids and liver  She is seeing Dr Erlinda Hong for knee OA  Rheumatology: she does have both lupus and RA. 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 Keep headache diary Secondary stroke prevention as managed by PCP: Follow up 7-8 months  Today Zykiria notes onset of vaginal soreness about one week ago She noted burning with urination a couple of days ago; however, no other urinary symptoms.  It seems as though the issue may be vaginal as opposed to a bladder infection She tried some monistat OTC but this possibly made her worse Some discharge noted- bad odor She has noted itching as well No change in her urinary frequency No hematuria No new sexual partners   She has noted some temps in the 99s-over the last few weeks Her max temp was 100 She has no other signs of illness such as fatigue or cough.    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   Systemic lupus erythematosus ([place]) 08/04/2019   Acute ischemic stroke ([place]) 08/14/2018   HTN (hypertension) 08/14/2018   Controlled type 2 diabetes mellitus without complication, without long-term current use of insulin ([place]) 04/03/2016   History of CVA (cerebrovascular accident) 03/12/2016   Chest pain at rest 03/12/2016   Peripheral edema 03/12/2016    Past Medical History:  Diagnosis Date   Cardiomyopathy ([place])    Hypertension    Lupus ([place])    PUD (peptic ulcer disease)    Stroke ([place]) 08/13/2018   Vertigo  Past Surgical History:  Procedure Laterality Date   BACK SURGERY     CARPAL TUNNEL RELEASE     KNEE CARTILAGE SURGERY     NECK SURGERY     2015    Social History   Tobacco Use   Smoking status: Former    Types: Cigarettes    Quit date: 07/21/2004    Years since quitting: 16.9   Smokeless tobacco: Never  Vaping Use   Vaping Use: Never used  Substance Use Topics   Alcohol use: Yes    Comment: occasional    Drug use: No    Family History  Problem Relation Age of Onset   Hypertension Mother    Arthritis Mother    Heart failure Mother    Stroke Mother    Kidney disease Father    Hypertension Father     Allergies  Allergen Reactions   Codeine Nausea And Vomiting    Medication list has been reviewed and updated.  Current Outpatient Medications on File Prior to Visit  Medication Sig Dispense Refill   diclofenac Sodium (VOLTAREN) 1 % GEL Apply 4 g topically 4 (four) times daily as needed. 500 g 6   ELIQUIS 5 MG TABS tablet TAKE ONE TABLET BY MOUTH AT BREAKFAST AND AT BEDTIME 762 tablet 1   folic acid (FOLVITE) 1 MG tablet Take 1 mg by mouth daily.     gabapentin (NEURONTIN) 600 MG tablet TAKE ONE TABLET BY MOUTH EVERY MORNING and TAKE ONE TABLET BY MOUTH EVERYDAY AT BEDTIME 60 tablet 2   hydrochlorothiazide (HYDRODIURIL) 25 MG tablet Take 1 tablet (25 mg total) by mouth daily. 90 tablet 3   losartan (COZAAR) 50 MG tablet TAKE ONE TABLET BY MOUTH EVERY MORNING 90 tablet 3   meclizine (ANTIVERT) 25 MG tablet TAKE ONE TABLET BY MOUTH twice daily AS NEEDED FOR dizziness 90 tablet 1   metFORMIN (GLUCOPHAGE) 500 MG tablet Take 0.5-1 tablets (250-500 mg total) by mouth at bedtime. 90 tablet 3   methocarbamol (ROBAXIN) 500 MG tablet Take 1 tablet (500 mg total) by mouth every 8 (eight) hours as needed for muscle spasms. 40 tablet 0   metoprolol succinate (TOPROL-XL) 25 MG 24 hr tablet TAKE ONE TABLET BY MOUTH EVERY MORNING 90 tablet 3   omeprazole (PRILOSEC) 40 MG capsule TAKE ONE CAPSULE BY MOUTH DAILY 30 capsule 3   ondansetron (ZOFRAN ODT) 8 MG disintegrating tablet Take 1 tablet (8 mg total) by mouth every 8 (eight) hours as needed for nausea or vomiting. 10 tablet 0   rosuvastatin (CRESTOR) 20 MG tablet Take 1 tablet (20 mg total) by mouth daily. 90 tablet 3   sucralfate (CARAFATE) 1 g tablet TAKE ONE TABLET BY MOUTH FOUR TIMES DAILY with meals AND AT BEDTIME 40 tablet 3   traMADol  (ULTRAM) 50 MG tablet Take 1 tablet (50 mg total) by mouth every 8 (eight) hours as needed. 30 tablet 0   No current facility-administered medications on file prior to visit.    Review of Systems:  As per HPI- otherwise negative.   Physical Examination: Vitals:   06/26/21 1437  BP: 112/60  Pulse: 72  Resp: 18  Temp: 98.2 F (36.8 C)  SpO2: 97%   Vitals:   06/26/21 1437  Weight: 189 lb 12.8 oz (86.1 kg)  Height: 5\' 3"  (1.6 m)   Body mass index is 33.62 kg/m. Ideal Body Weight: Weight in (lb) to have BMI = 25: 140.8  GEN: no acute distress.  Mildly  obese, looks well HEENT: Atraumatic, Normocephalic.  Ears and Nose: No external deformity. CV: RRR, No M/G/R. No JVD. No thrill. No extra heart sounds. PULM: CTA B, no wheezes, crackles, rhonchi. No retractions. No resp. distress. No accessory muscle use. ABD: S, NT, ND, +BS. No rebound. No HSM. EXTR: No c/c/e PSYCH: Normally interactive. Conversant.  There is thick white discharge present in the vaginal canal, some erythema and irritation is present.  No cervical motion tenderness, no masses  Results for orders placed or performed in visit on 06/26/21  POCT URINALYSIS DIP (CLINITEK)  Result Value Ref Range   Color, UA yellow yellow   Clarity, UA cloudy (A) clear   Glucose, UA negative negative mg/dL   Bilirubin, UA negative negative   Ketones, POC UA negative negative mg/dL   Spec Grav, UA >=1.030 (A) 1.010 - 1.025   Blood, UA negative negative   pH, UA 6.0 5.0 - 8.0   POC PROTEIN,UA trace negative, trace   Urobilinogen, UA 0.2 0.2 or 1.0 E.U./dL   Nitrite, UA Negative Negative   Leukocytes, UA Trace (A) Negative    Assessment and Plan: Vaginal discharge - Plan: Cervicovaginal ancillary only( [place]), fluconazole (DIFLUCAN) 150 MG tablet  Pain with urination - Plan: POCT URINALYSIS DIP (CLINITEK), Urine Culture  Vaginal pain - Plan: Cervicovaginal ancillary only( [place])  Low grade fever  Patient  seen today with concern of vaginal discharge.  On exam, yeast infection is most likely.  We will have her start on Diflucan Other labs are pending as above Urine sent for culture, UA is borderline Patient has noticed possible low-grade fever at home with no other symptoms for the last 2 to 3 weeks.  Her temperature in the office today is normal Advised her that her thermometer may not be completely accurate.  Encouraged her to try a second thermometer for confirmation.  However, if she is running temperatures greater than 99.5 on a regular basis there could be a problem; please let me know in this case especially if any other symptoms of illness  Signed Lamar Blinks, MD Addendum 12/9, received urine culture Vaginal swab is still pending Results for orders placed or performed in visit on 06/26/21  Urine Culture   Specimen: Urine  Result Value Ref Range   MICRO NUMBER: [id number]    SPECIMEN QUALITY: Adequate    Sample Source NOT GIVEN    STATUS: FINAL    ISOLATE 1:      Mixed genital flora isolated. These superficial bacteria are not indicative of a urinary tract infection. No further organism identification is warranted on this specimen. If clinically indicated, recollect clean-catch, mid-stream urine and transfer  immediately to Urine Culture Transport Tube.   POCT URINALYSIS DIP (CLINITEK)  Result Value Ref Range   Color, UA yellow yellow   Clarity, UA cloudy (A) clear   Glucose, UA negative negative mg/dL   Bilirubin, UA negative negative   Ketones, POC UA negative negative mg/dL   Spec Grav, UA >=1.030 (A) 1.010 - 1.025   Blood, UA negative negative   pH, UA 6.0 5.0 - 8.0   POC PROTEIN,UA trace negative, trace   Urobilinogen, UA 0.2 0.2 or 1.0 E.U./dL   Nitrite, UA Negative Negative   Leukocytes, UA Trace (A) Negative

## 2021-06-25 NOTE — Progress Notes (Signed)
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 Keep headache diary Secondary stroke prevention as managed by PCP: Follow up 7-8 months   Subjective:  Amanda Saunders is a 65 year old left-handed woman with history of stroke who follows up for headache.   UPDATE: She hasn't had a headache in several months.  Doing well.     Current NSAIDs:  ASA 81mg  Current analgesic:  Tylenol Current muscle relaxant:  Robaxin 500mg  Current antihypertensive:  HCTZ, losartan, Toprol XL Current antiepileptic:  gabapentin 600mg  twice daily Current antihistamine:  Meclizine Other medication:  Eliquis   HISTORY: Headache: On 03/03/19, she developed a sudden severe pounding right sided headache (radiating across forehead) with right facial burning.  There was associated nausea, sometimes vomiting, photophobia and phonophobia.  No visual disturbance.  She felt that her equilibrium was off.  She went to the ED on 03/11/19 for further evaluation. CT head personally reviewed and negative for acute intracranial abnormality such as hemorrhage or infarct.  CBC and BMP were unremarkable.  She was given a headache cocktail of Reglan and Benadryl and discharged home.  Headaches are still present but not as severe.  She reported temperature of 99.  They lessened on 8/24.  Headache is mild to moderate, lasting a couple of hours off and on, daily.  Sometimes she feels shaking in her head.  Reports a little bit of neck pain.     04/17/2019 MRI w wo and MRA of head: advanced widespread chronic small vessel ischemic changes as well as occlusion of right distal M1 segment and severe  stenoses of left M2 branches, A2 branches and mild irregularity of the PCA branches, but no acute intracranial abnormality. 04/13/2019 Sed Rate 11.   History of CVA: She was admitted to [facility] on 08/14/18 for increased left arm numbness and weakness with left sided tremor as well as headache, dizziness with nausea and vomiting.  CT of head was personally reviewed and showed no acute findings.  MRI of brain personally reviewed and demonstrated scattered acute watershed subcortical and small cortical infarcts in the right MCA/ACA and MCA/PCA areas.  MRA of head personally reviewed showed severe stenosis or short segment occlusion of the distal right MCA M1 and proximal M2 segments as well as proximal severe stenosis or short segment occlusion of left MCA M2 segment.  Carotid doppler showed no hemodynamically significant stenosis.  2D echocardiogram showed EF 45-50%.  LDL was 77.  Hgb A1c was 6.2.  ASA 81mg  daily was switched to ASA 325mg  and Plavix 75mg  daily for 3 months with plan to subsequently continue Plavix alone.  She was continued on atorvastatin 40mg  daily.     Past medication:  tramadol, naproxen  PAST MEDICAL HISTORY: Past Medical History:  Diagnosis Date   Cardiomyopathy ([place])    Hypertension    Lupus ([place])    PUD (peptic ulcer disease)    Stroke ([place]) 08/13/2018   Vertigo     MEDICATIONS: Current Outpatient Medications on File Prior to Visit  Medication Sig Dispense Refill   sucralfate (CARAFATE) 1 g tablet TAKE ONE TABLET BY MOUTH FOUR TIMES DAILY with meals AND AT BEDTIME 40 tablet 3   diclofenac Sodium (VOLTAREN) 1 %  GEL Apply 4 g topically 4 (four) times daily as needed. 500 g 6   ELIQUIS 5 MG TABS tablet TAKE ONE TABLET BY MOUTH AT BREAKFAST AND AT BEDTIME 924 tablet 1   folic acid (FOLVITE) 1 MG tablet Take 1 mg by mouth daily.     gabapentin (NEURONTIN) 600 MG tablet TAKE ONE TABLET BY MOUTH EVERY MORNING and TAKE ONE TABLET BY MOUTH EVERYDAY AT BEDTIME 60  tablet 2   hydrochlorothiazide (HYDRODIURIL) 25 MG tablet Take 1 tablet (25 mg total) by mouth daily. 90 tablet 3   hydroxychloroquine (PLAQUENIL) 200 MG tablet Take 200 mg by mouth 2 (two) times daily.     losartan (COZAAR) 50 MG tablet TAKE ONE TABLET BY MOUTH EVERY MORNING 90 tablet 3   meclizine (ANTIVERT) 25 MG tablet TAKE ONE TABLET BY MOUTH twice daily AS NEEDED FOR dizziness 90 tablet 1   metFORMIN (GLUCOPHAGE) 500 MG tablet Take 0.5-1 tablets (250-500 mg total) by mouth at bedtime. 90 tablet 3   methocarbamol (ROBAXIN) 500 MG tablet Take 1 tablet (500 mg total) by mouth every 8 (eight) hours as needed for muscle spasms. 40 tablet 0   metoprolol succinate (TOPROL-XL) 25 MG 24 hr tablet TAKE ONE TABLET BY MOUTH EVERY MORNING 90 tablet 3   omeprazole (PRILOSEC) 40 MG capsule TAKE ONE CAPSULE BY MOUTH DAILY 30 capsule 3   ondansetron (ZOFRAN ODT) 8 MG disintegrating tablet Take 1 tablet (8 mg total) by mouth every 8 (eight) hours as needed for nausea or vomiting. 10 tablet 0   rosuvastatin (CRESTOR) 20 MG tablet Take 1 tablet (20 mg total) by mouth daily. 90 tablet 3   traMADol (ULTRAM) 50 MG tablet Take 1 tablet (50 mg total) by mouth every 8 (eight) hours as needed. 30 tablet 0   No current facility-administered medications on file prior to visit.    ALLERGIES: Allergies  Allergen Reactions   Codeine Nausea And Vomiting    FAMILY HISTORY: Family History  Problem Relation Age of Onset   Hypertension Mother    Arthritis Mother    Heart failure Mother    Stroke Mother    Kidney disease Father    Hypertension Father       Objective:  Blood pressure 120/75, pulse 77, height 5\' 3"  (1.6 m), weight 189 lb 9.6 oz (86 kg), SpO2 99 %. General: No acute distress.  Patient appears well-groomed.    Amanda Clines, DO  CC: Lamar Blinks, MD

## 2021-06-26 ENCOUNTER — Ambulatory Visit (INDEPENDENT_AMBULATORY_CARE_PROVIDER_SITE_OTHER): Payer: Medicare Other | Admitting: Neurology

## 2021-06-26 ENCOUNTER — Other Ambulatory Visit (HOSPITAL_COMMUNITY)
Admission: RE | Admit: 2021-06-26 | Discharge: 2021-06-26 | Disposition: A | Discharge status: Home / Self Care | Payer: Medicare Other | Source: Ambulatory Visit | Attending: Family Medicine | Admitting: Family Medicine

## 2021-06-26 ENCOUNTER — Encounter: Payer: Self-pay | Admitting: Neurology

## 2021-06-26 ENCOUNTER — Other Ambulatory Visit: Payer: Self-pay

## 2021-06-26 ENCOUNTER — Ambulatory Visit (INDEPENDENT_AMBULATORY_CARE_PROVIDER_SITE_OTHER): Payer: Medicare Other | Admitting: Family Medicine

## 2021-06-26 ENCOUNTER — Telehealth: Payer: Medicare Other

## 2021-06-26 VITALS — BP 120/75 | HR 77 | Ht 63.0 in | Wt 189.6 lb

## 2021-06-26 VITALS — BP 112/60 | HR 72 | Temp 98.2°F | Resp 18 | Ht 63.0 in | Wt 189.8 lb

## 2021-06-26 DIAGNOSIS — N898 Other specified noninflammatory disorders of vagina: Secondary | ICD-10-CM | POA: Insufficient documentation

## 2021-06-26 DIAGNOSIS — R102 Pelvic and perineal pain: Secondary | ICD-10-CM

## 2021-06-26 DIAGNOSIS — R509 Fever, unspecified: Secondary | ICD-10-CM | POA: Diagnosis not present

## 2021-06-26 DIAGNOSIS — E785 Hyperlipidemia, unspecified: Secondary | ICD-10-CM

## 2021-06-26 DIAGNOSIS — I6529 Occlusion and stenosis of unspecified carotid artery: Secondary | ICD-10-CM | POA: Diagnosis not present

## 2021-06-26 DIAGNOSIS — R309 Painful micturition, unspecified: Secondary | ICD-10-CM

## 2021-06-26 DIAGNOSIS — Z8673 Personal history of transient ischemic attack (TIA), and cerebral infarction without residual deficits: Secondary | ICD-10-CM

## 2021-06-26 DIAGNOSIS — I10 Essential (primary) hypertension: Secondary | ICD-10-CM

## 2021-06-26 DIAGNOSIS — R519 Headache, unspecified: Secondary | ICD-10-CM | POA: Diagnosis not present

## 2021-06-26 DIAGNOSIS — I63511 Cerebral infarction due to unspecified occlusion or stenosis of right middle cerebral artery: Secondary | ICD-10-CM | POA: Diagnosis not present

## 2021-06-26 LAB — POCT URINALYSIS DIP (CLINITEK)
Bilirubin, UA: NEGATIVE
Blood, UA: NEGATIVE
Glucose, UA: NEGATIVE mg/dL
Ketones, POC UA: NEGATIVE mg/dL
Nitrite, UA: NEGATIVE
Spec Grav, UA: >=1.03 — AB (ref 1.010–1.025)
Urobilinogen, UA: 0.2 E.U./dL
pH, UA: 6 (ref 5.0–8.0)

## 2021-06-26 MED ORDER — HYDROXYCHLOROQUINE SULFATE 200 MG PO TABS: 200.0000 mg | ORAL_TABLET | Freq: Two times a day (BID) | ORAL | 0 refills | Status: DC

## 2021-06-26 MED ORDER — FLUCONAZOLE 150 MG PO TABS: 150.0000 mg | ORAL_TABLET | Freq: Once | ORAL | 0 refills | Status: AC

## 2021-06-26 NOTE — Patient Instructions (Addendum)
It appears that you have a vaginal yeast infection- treat with oral diflucan weekly until cleared up  I will be in touch with your swab result and your urine culture as well   Let me know if you are running temps over 99.5 on a consistent basis at home

## 2021-06-27 LAB — URINE CULTURE
MICRO NUMBER:: 12726251
SPECIMEN QUALITY:: ADEQUATE

## 2021-06-28 ENCOUNTER — Encounter: Payer: Self-pay | Admitting: Family Medicine

## 2021-06-28 DIAGNOSIS — R052 Subacute cough: Secondary | ICD-10-CM

## 2021-06-28 LAB — CERVICOVAGINAL ANCILLARY ONLY
Bacterial Vaginitis (gardnerella): NEGATIVE
Candida Glabrata: NEGATIVE
Candida Vaginitis: NEGATIVE
Chlamydia: NEGATIVE
Comment: NEGATIVE
Comment: NEGATIVE
Comment: NEGATIVE
Comment: NEGATIVE
Comment: NEGATIVE
Comment: NORMAL
Neisseria Gonorrhea: NEGATIVE
Trichomonas: NEGATIVE

## 2021-07-01 ENCOUNTER — Telehealth: Payer: Self-pay

## 2021-07-01 NOTE — Telephone Encounter (Signed)
Letter has been sent to patient instructing them to call us if they are still interested in completing their sleep study. If we have not received a response from the patient within 30 days of this notice, the order will be cancelled and they will need to discuss the need for a sleep study at their next office visit.  ° °

## 2021-07-08 ENCOUNTER — Telehealth: Payer: Self-pay | Admitting: Family Medicine

## 2021-07-08 NOTE — Telephone Encounter (Signed)
Patient states she has not gotten any better and her cough is still there. She would like a cough medicine prescribed to her since all the otc medications have not helped. Please advice   [facility] on [address]

## 2021-07-09 MED ORDER — PROMETHAZINE-DM 6.25-15 MG/5ML PO SYRP: 5.0000 mL | ORAL_SOLUTION | Freq: Four times a day (QID) | ORAL | 0 refills | Status: DC | PRN

## 2021-07-09 NOTE — Telephone Encounter (Signed)
She sent me a mychart message this am and I responded to her and placed an rx order.  Please ask her to check her message

## 2021-07-09 NOTE — Telephone Encounter (Signed)
Please advise 

## 2021-07-10 NOTE — Telephone Encounter (Signed)
Pt aware and voices understanding.  Follow up is scheduled for 07/29/21 since she has her God-sisters funeral to attend after christmas.

## 2021-07-23 ENCOUNTER — Other Ambulatory Visit: Payer: Self-pay | Admitting: Family Medicine

## 2021-07-23 ENCOUNTER — Telehealth: Payer: Self-pay | Admitting: Orthopaedic Surgery

## 2021-07-23 DIAGNOSIS — R052 Subacute cough: Secondary | ICD-10-CM

## 2021-07-23 NOTE — Telephone Encounter (Signed)
Pt called requesting a call back to set appt for [place] bilateral knee injections. Last injection 11/22. Please call pt at [phone].

## 2021-07-23 NOTE — Telephone Encounter (Signed)
Called and left a VM advising patient that next available gel injection would need to be after Nov 26, 2021 due to receiving last gel injection on 06/11/2021.

## 2021-07-24 DIAGNOSIS — M329 Systemic lupus erythematosus, unspecified: Secondary | ICD-10-CM | POA: Diagnosis not present

## 2021-07-25 ENCOUNTER — Ambulatory Visit (INDEPENDENT_AMBULATORY_CARE_PROVIDER_SITE_OTHER): Payer: Commercial Managed Care - HMO | Admitting: Orthopaedic Surgery

## 2021-07-25 ENCOUNTER — Encounter: Payer: Self-pay | Admitting: Orthopaedic Surgery

## 2021-07-25 ENCOUNTER — Other Ambulatory Visit: Payer: Self-pay

## 2021-07-25 DIAGNOSIS — M170 Bilateral primary osteoarthritis of knee: Secondary | ICD-10-CM

## 2021-07-25 MED ORDER — LIDOCAINE HCL 1 % IJ SOLN
3.0000 mL | INTRAMUSCULAR | Status: AC | PRN
  Administered 2021-07-25: 3 mL

## 2021-07-25 MED ORDER — BUPIVACAINE HCL 0.25 % IJ SOLN
0.6600 mL | INTRAMUSCULAR | Status: AC | PRN
  Administered 2021-07-25: .66 mL via INTRA_ARTICULAR

## 2021-07-25 MED ORDER — METHYLPREDNISOLONE ACETATE 40 MG/ML IJ SUSP
13.3300 mg | INTRAMUSCULAR | Status: AC | PRN
  Administered 2021-07-25: 13.33 mg via INTRA_ARTICULAR

## 2021-07-25 NOTE — Progress Notes (Addendum)
Office Visit Note   Patient: Amanda Saunders           Date of Birth: 07-03-1956           MRN: [id number] Visit Date: 07/25/2021              Requested by: Darreld Mclean, MD [address] [place],  [place] [address] PCP: Darreld Mclean, MD   Assessment & Plan: Visit Diagnoses:  1. Bilateral primary osteoarthritis of knee     Plan: Impression is bilateral knee degenerative joint disease.  Today, we discussed proceeding with intra-articular cortisone injections to provide her some relief until she is able to get approval for repeat viscosupplementation injections.  She is agreeable to this plan.  I would also like to get her fitted for a DJ O medial compartment unloader braces due to the degree of arthritis in the medial compartment.  She will follow-up with [place] as needed.  Instability to the knees requiring special braces.  Follow-Up Instructions: Return if symptoms worsen or fail to improve.   Orders:  Orders Placed This Encounter  Procedures   Large Joint Inj: bilateral knee   No orders of the defined types were placed in this encounter.     Procedures: Large Joint Inj: bilateral knee on 07/25/2021 9:56 AM Indications: pain Details: 22 G needle, anterolateral approach Medications (Right): 0.66 mL bupivacaine 0.25 %; 3 mL lidocaine 1 %; 13.33 mg methylPREDNISolone acetate 40 MG/ML Medications (Left): 0.66 mL bupivacaine 0.25 %; 3 mL lidocaine 1 %; 13.33 mg methylPREDNISolone acetate 40 MG/ML     Clinical Data: No additional findings.   Subjective: Chief Complaint  Patient presents with   Left Knee - Pain   Right Knee - Pain    HPI patient is a pleasant 66 year old female who comes in today with bilateral knee pain right greater than left.  She has a history of primarily medial compartment DJD and she has been dealing with this for a while.  She has been seen by [place] as well as Dr. Junius Roads in the past.  She underwent viscosupplementation  injection in October which did seem to help until recently.  The pain she has is to the entire aspect of both knees.  Pain is worse going from a seated to standing position as well as occasionally at night.  She is unable to take oral NSAIDs due to chronic kidney disease but does take Tylenol use Voltaren gel without significant relief.  She denies any previous cortisone injection to either knee.  She has tried wearing a knee sleeve in the past without relief.  Review of Systems as detailed in HPI.  All others reviewed and are negative.   Objective: Vital Signs: There were no vitals taken for this visit.  Physical Exam well-developed well-nourished female no acute distress.  Alert and oriented x3.  Ortho Exam bilateral knee exam shows trace effusion.  Range of motion 0 to 125 degrees.  Medial and lateral joint line tenderness.  Moderate patellofemoral crepitus.  She is neurovascular tact distally.  Mild varus valgus laxity to both knees.  Specialty Comments:  No specialty comments available.  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   Systemic lupus erythematosus ([place]) 08/04/2019   Acute ischemic stroke ([place]) 08/14/2018   HTN (hypertension) 08/14/2018   Controlled type 2 diabetes mellitus without complication, without long-term current use  of insulin ([place]) 04/03/2016   History of CVA (cerebrovascular accident) 03/12/2016   Chest pain at rest 03/12/2016   Peripheral edema 03/12/2016   Past Medical History:  Diagnosis Date   Cardiomyopathy ([place])    Hypertension    Lupus ([place])    PUD (peptic ulcer disease)    Stroke ([place]) 08/13/2018   Vertigo     Family History  Problem Relation Age of Onset   Hypertension Mother    Arthritis Mother    Heart failure Mother    Stroke Mother    Kidney disease Father    Hypertension Father     Past Surgical History:  Procedure Laterality Date    BACK SURGERY     CARPAL TUNNEL RELEASE     KNEE CARTILAGE SURGERY     NECK SURGERY     2015   Social History   Occupational History   Occupation: retired    Comment: retired  Tobacco Use   Smoking status: Former    Types: Cigarettes    Quit date: 07/21/2004    Years since quitting: 17.0   Smokeless tobacco: Never  Vaping Use   Vaping Use: Never used  Substance and Sexual Activity   Alcohol use: Yes    Comment: occasional   Drug use: No   Sexual activity: Not on file

## 2021-07-27 NOTE — Progress Notes (Addendum)
[facility] [address] [place], [place] [address] [phone]  Date:  07/29/2021   Name:  Amanda Saunders   DOB:  1956/02/07   MRN:  [id number]  PCP:  Darreld Mclean, MD    Chief Complaint: Follow-up (Pt says he cough still gives her a HA, but no more fevers )   History of Present Illness:  Amanda Saunders is a 66 y.o. very pleasant female patient who presents with the following:  Patient seen today for follow-up of recent illness-history of recently diagnosed controlled diabetes, stroke, lupus and RA, hypertension, noncompaction cardiomyopathy  She is seen by rheumatology for lupus and rheumatoid arthritis-Plaquenil, Benlysta infusion Most recent visit with myself in December for concern of vaginal discharge At that time her wet prep was negative for yeast or BV-we did treat her with Diflucan  She contacted me again on December 19 with concern of low-grade fevers and cough, request a prescription for cough syrup She notes her fevers are gone but she continues to have some cough which may cause a HA The cough is about the same- has been present for about one month now The cough may be productive of clear or yellowish mucus   Former smoker- quit years ago We did not yet use any abx for this illness  She plans to get the last covid booster soon-as soon as she is over her current illness    Patient Active Problem List   Diagnosis Date Noted   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   Systemic lupus erythematosus ([place]) 08/04/2019   Acute ischemic stroke ([place]) 08/14/2018   HTN (hypertension) 08/14/2018   Controlled type 2 diabetes mellitus without complication, without long-term current use of insulin ([place]) 04/03/2016   History of CVA (cerebrovascular accident) 03/12/2016   Chest pain at rest 03/12/2016   Peripheral edema 03/12/2016    Past Medical History:  Diagnosis Date   Cardiomyopathy  ([place])    Hypertension    Lupus ([place])    PUD (peptic ulcer disease)    Stroke ([place]) 08/13/2018   Vertigo     Past Surgical History:  Procedure Laterality Date   BACK SURGERY     CARPAL TUNNEL RELEASE     KNEE CARTILAGE SURGERY     NECK SURGERY     2015    Social History   Tobacco Use   Smoking status: Former    Types: Cigarettes    Quit date: 07/21/2004    Years since quitting: 17.0   Smokeless tobacco: Never  Vaping Use   Vaping Use: Never used  Substance Use Topics   Alcohol use: Yes    Comment: occasional   Drug use: No    Family History  Problem Relation Age of Onset   Hypertension Mother    Arthritis Mother    Heart failure Mother    Stroke Mother    Kidney disease Father    Hypertension Father     Allergies  Allergen Reactions   Codeine Nausea And Vomiting    Medication list has been reviewed and updated.  Current Outpatient Medications on File Prior to Visit  Medication Sig Dispense Refill   diclofenac Sodium (VOLTAREN) 1 % GEL Apply 4 g topically 4 (four) times daily as needed. 500 g 6   ELIQUIS 5 MG TABS tablet TAKE ONE TABLET BY MOUTH AT BREAKFAST AND AT BEDTIME 606 tablet 1   folic acid (FOLVITE) 1 MG  tablet Take 1 mg by mouth daily.     gabapentin (NEURONTIN) 600 MG tablet TAKE ONE TABLET BY MOUTH EVERY MORNING and TAKE ONE TABLET BY MOUTH EVERYDAY AT BEDTIME 60 tablet 2   hydrochlorothiazide (HYDRODIURIL) 25 MG tablet Take 1 tablet (25 mg total) by mouth daily. 90 tablet 3   hydroxychloroquine (PLAQUENIL) 200 MG tablet Take 1 tablet (200 mg total) by mouth 2 (two) times daily. 30 tablet 0   losartan (COZAAR) 50 MG tablet TAKE ONE TABLET BY MOUTH EVERY MORNING 90 tablet 3   meclizine (ANTIVERT) 25 MG tablet TAKE ONE TABLET BY MOUTH twice daily AS NEEDED FOR dizziness 90 tablet 1   metFORMIN (GLUCOPHAGE) 500 MG tablet Take 0.5-1 tablets (250-500 mg total) by mouth at bedtime. 90 tablet 3   methocarbamol (ROBAXIN) 500 MG tablet Take 1 tablet (500 mg  total) by mouth every 8 (eight) hours as needed for muscle spasms. 40 tablet 0   metoprolol succinate (TOPROL-XL) 25 MG 24 hr tablet TAKE ONE TABLET BY MOUTH EVERY MORNING 90 tablet 3   omeprazole (PRILOSEC) 40 MG capsule TAKE ONE CAPSULE BY MOUTH DAILY 30 capsule 3   ondansetron (ZOFRAN ODT) 8 MG disintegrating tablet Take 1 tablet (8 mg total) by mouth every 8 (eight) hours as needed for nausea or vomiting. 10 tablet 0   promethazine-dextromethorphan (PROMETHAZINE-DM) 6.25-15 MG/5ML syrup TAKE 5 ML BY MOUTH  4 TIMES DAILY AS NEEDED FOR COUGH 60 mL 0   rosuvastatin (CRESTOR) 20 MG tablet Take 1 tablet (20 mg total) by mouth daily. 90 tablet 3   sucralfate (CARAFATE) 1 g tablet TAKE ONE TABLET BY MOUTH FOUR TIMES DAILY with meals AND AT BEDTIME 40 tablet 3   traMADol (ULTRAM) 50 MG tablet Take 1 tablet (50 mg total) by mouth every 8 (eight) hours as needed. 30 tablet 0   No current facility-administered medications on file prior to visit.    Review of Systems:  As per HPI- otherwise negative.  Wt Readings from Last 3 Encounters:  07/29/21 189 lb 3.2 oz (85.8 kg)  06/26/21 189 lb 12.8 oz (86.1 kg)  06/26/21 189 lb 9.6 oz (86 kg)      Physical Examination: Vitals:   07/29/21 1108  BP: 110/65  Pulse: 63  Resp: 18  Temp: 98.1 F (36.7 C)  SpO2: 100%   Vitals:   07/29/21 1108  Weight: 189 lb 3.2 oz (85.8 kg)  Height: 5\' 3"  (1.6 m)   Body mass index is 33.52 kg/m. Ideal Body Weight: Weight in (lb) to have BMI = 25: 140.8  GEN: no acute distress.  Mild obesity, looks well HEENT: Atraumatic, Normocephalic.  Net IO: No IO data has been entered for this period [07/29/21 1214] Ears and Nose: No external deformity. CV: RRR, No M/G/R. No JVD. No thrill. No extra heart sounds. PULM: CTA B, no wheezes, crackles, rhonchi. No retractions. No resp. distress. No accessory muscle use. ABD: S, NT, ND, +BS. No rebound. No HSM. EXTR: No c/c/e PSYCH: Normally interactive. Conversant.     Assessment and Plan: Systemic lupus erythematosus, unspecified SLE type, unspecified organ involvement status ([place])  Primary hypertension  Controlled type 2 diabetes mellitus without complication, without long-term current use of insulin ([facility]) - Plan: Hemoglobin A1c  Acute bronchitis, unspecified organism - Plan: doxycycline (VIBRAMYCIN) 100 MG capsule, DG Chest 2 View, fluconazole (DIFLUCAN) 150 MG tablet  Encounter for screening mammogram for malignant neoplasm of breast - Plan: MM 3D SCREEN BREAST BILATERAL  Following up  today for couple of concerns.  We will check on her A1c to monitor diabetes Ordered mammogram Blood pressures under good control on current regimen She has noted cough for about 3 weeks, sometimes productive.  Ordered a chest x-ray and will treat with a course of doxycycline.  Also provided prescription for Diflucan in case of secondary yeast infection Will plan further follow- up pending labs.   Signed Lamar Blinks, MD  Received her labs as below, message to patient  Results for orders placed or performed in visit on 07/29/21  Hemoglobin A1c  Result Value Ref Range   Hgb A1c MFr Bld 6.4 4.6 - 6.5 %

## 2021-07-29 ENCOUNTER — Encounter: Payer: Self-pay | Admitting: Family Medicine

## 2021-07-29 ENCOUNTER — Ambulatory Visit (INDEPENDENT_AMBULATORY_CARE_PROVIDER_SITE_OTHER): Payer: 59 | Admitting: Family Medicine

## 2021-07-29 VITALS — BP 110/65 | HR 63 | Temp 98.1°F | Resp 18 | Ht 63.0 in | Wt 189.2 lb

## 2021-07-29 DIAGNOSIS — I10 Essential (primary) hypertension: Secondary | ICD-10-CM | POA: Diagnosis not present

## 2021-07-29 DIAGNOSIS — E119 Type 2 diabetes mellitus without complications: Secondary | ICD-10-CM

## 2021-07-29 DIAGNOSIS — Z1231 Encounter for screening mammogram for malignant neoplasm of breast: Secondary | ICD-10-CM | POA: Diagnosis not present

## 2021-07-29 DIAGNOSIS — J209 Acute bronchitis, unspecified: Secondary | ICD-10-CM | POA: Diagnosis not present

## 2021-07-29 DIAGNOSIS — M329 Systemic lupus erythematosus, unspecified: Secondary | ICD-10-CM

## 2021-07-29 LAB — HEMOGLOBIN A1C: Hgb A1c MFr Bld: 6.4 % (ref 4.6–6.5)

## 2021-07-29 MED ORDER — FLUCONAZOLE 150 MG PO TABS: 150.0000 mg | ORAL_TABLET | Freq: Once | ORAL | 0 refills | Status: AC

## 2021-07-29 MED ORDER — DOXYCYCLINE HYCLATE 100 MG PO CAPS: 100.0000 mg | ORAL_CAPSULE | Freq: Two times a day (BID) | ORAL | 0 refills | Status: DC

## 2021-07-29 NOTE — Patient Instructions (Addendum)
Good to see you today!    I will be in touch with your A1c, chest x-ray and mammogram report We will use a course of doxycycline for bronchitis- let me know if not effective for you

## 2021-07-31 ENCOUNTER — Ambulatory Visit (HOSPITAL_BASED_OUTPATIENT_CLINIC_OR_DEPARTMENT_OTHER): Payer: Commercial Managed Care - HMO

## 2021-08-01 ENCOUNTER — Ambulatory Visit (HOSPITAL_BASED_OUTPATIENT_CLINIC_OR_DEPARTMENT_OTHER): Payer: Commercial Managed Care - HMO

## 2021-08-08 LAB — HM DIABETES EYE EXAM

## 2021-08-12 ENCOUNTER — Ambulatory Visit: Payer: Medicare Other | Admitting: Neurology

## 2021-08-14 ENCOUNTER — Telehealth: Payer: Self-pay | Admitting: Pharmacist

## 2021-08-14 ENCOUNTER — Ambulatory Visit: Payer: Self-pay | Admitting: Pharmacist

## 2021-08-14 DIAGNOSIS — M170 Bilateral primary osteoarthritis of knee: Secondary | ICD-10-CM | POA: Diagnosis not present

## 2021-08-14 NOTE — Telephone Encounter (Signed)
°  Care Management   Follow Up Note   08/14/2021 Name: Amanda Saunders MRN: [id number] DOB: [birth year]   Referred by: Darreld Mclean, MD Reason for referral : No chief complaint on file.  Initially reached patient at 10:49 am but patient stated she had a headache and asked if I would call back in the afternoon.   Called back at 2:52pm but : An unsuccessful telephone outreach was attempted today. The patient was referred to the case management team for assistance with care management and care coordination.   Follow Up Plan: The care management team will reach out to the patient again over the next 30 days.   Cherre Robins, PharmD Clinical Pharmacist [facility]

## 2021-08-15 NOTE — Chronic Care Management (AMB) (Signed)
Patient was contacted but was unable to complete appointment due to having a headache. Patient asked for return call in afternoon. Tried to call back but no answer. LM on VM.

## 2021-08-17 ENCOUNTER — Other Ambulatory Visit: Payer: Self-pay | Admitting: Student

## 2021-08-17 ENCOUNTER — Other Ambulatory Visit: Payer: Self-pay | Admitting: Family Medicine

## 2021-08-17 ENCOUNTER — Other Ambulatory Visit: Payer: Self-pay | Admitting: Neurology

## 2021-08-17 DIAGNOSIS — R1013 Epigastric pain: Secondary | ICD-10-CM

## 2021-08-19 NOTE — Telephone Encounter (Signed)
Eliquis 5 mg refill request received. Patient is 66 years old, weight- 85.8 kg, Crea- 1.04 on 06/01/21, Diagnosis- h/o CVA, and last seen by Dr. Stanford Breed on 03/11/21. Dose is appropriate based on dosing criteria. Will send in refill to requested pharmacy.

## 2021-08-20 ENCOUNTER — Telehealth: Payer: Self-pay

## 2021-08-20 NOTE — Telephone Encounter (Signed)
3 attempts to reach requesting provider's office, no answer no machine.

## 2021-08-20 NOTE — Telephone Encounter (Signed)
Patient called stating she was returning a phone call from the office. I advised her it appears our office has been trying to contact her dentist office about this clearance, but we have been unsuccessful with getting through to them. Patient advised she will call the dentist office and have them call our office.

## 2021-08-20 NOTE — Telephone Encounter (Signed)
° °  Primary Cardiologist: Kirk Ruths, MD  Chart reviewed as part of pre-operative protocol coverage. Simple dental extractions are considered low risk procedures per guidelines and generally do not require any specific cardiac clearance. It is also generally accepted that for simple extractions and dental cleanings, there is no need to interrupt blood thinner therapy.   SBE prophylaxis is not required for the patient.  I will route this recommendation to the requesting party via Epic fax function and remove from pre-op pool.  Please call with questions.  Deberah Pelton, NP 08/20/2021, 3:46 PM

## 2021-08-20 NOTE — Telephone Encounter (Signed)
Preoperative team, please contact requesting office and let them know that we are not able to provide blanket coverage.  Once details surrounding number of teeth to be pulled is known we will be able to provide recommendations.  Please ask for details surrounding dental extraction.  Thank you for your help.  Jossie Ng. Masen Luallen NP-C    08/20/2021, 12:00 PM [address] Office [phone] Fax [phone]

## 2021-08-20 NOTE — Telephone Encounter (Signed)
Pt is having 2 teeth extractions with Propofol

## 2021-08-20 NOTE — Telephone Encounter (Signed)
° °  Pre-operative Risk Assessment    Patient Name: Amanda Saunders  DOB: 20-Dec-1955 MRN: [id number]     Request for Surgical Clearance    Procedure:  Dental Extraction - Amount of Teeth to be Pulled:  Not listed  Date of Surgery:  Clearance TBD                                 Surgeon:  Dr.Paul Lannie Fields Surgeon's Group or Practice Name:  [facility] Phone number:  [phone] Fax number:  [phone]   Type of Clearance Requested:   - Medical  - Pharmacy:  Hold Apixaban (Eliquis)     Type of Anesthesia:  Local   Additional requests/questions:  Please advise surgeon/provider what medications should be held. Please fax a copy of clearance to the surgeon's office.  Evalee Mutton   08/20/2021, 11:22 AM

## 2021-08-21 ENCOUNTER — Other Ambulatory Visit (HOSPITAL_BASED_OUTPATIENT_CLINIC_OR_DEPARTMENT_OTHER): Payer: 59

## 2021-08-21 ENCOUNTER — Ambulatory Visit (HOSPITAL_BASED_OUTPATIENT_CLINIC_OR_DEPARTMENT_OTHER): Payer: 59

## 2021-08-21 DIAGNOSIS — M329 Systemic lupus erythematosus, unspecified: Secondary | ICD-10-CM | POA: Diagnosis not present

## 2021-08-22 ENCOUNTER — Other Ambulatory Visit: Payer: Self-pay | Admitting: Family Medicine

## 2021-08-26 ENCOUNTER — Other Ambulatory Visit: Payer: Self-pay

## 2021-08-26 ENCOUNTER — Encounter (HOSPITAL_BASED_OUTPATIENT_CLINIC_OR_DEPARTMENT_OTHER): Payer: Self-pay

## 2021-08-26 ENCOUNTER — Ambulatory Visit (HOSPITAL_BASED_OUTPATIENT_CLINIC_OR_DEPARTMENT_OTHER)
Admission: RE | Admit: 2021-08-26 | Discharge: 2021-08-26 | Disposition: A | Discharge status: Home / Self Care | Payer: Medicare Other | Source: Ambulatory Visit | Attending: Family Medicine | Admitting: Family Medicine

## 2021-08-26 DIAGNOSIS — Z1231 Encounter for screening mammogram for malignant neoplasm of breast: Secondary | ICD-10-CM | POA: Diagnosis not present

## 2021-08-26 NOTE — Telephone Encounter (Signed)
Requesting office is calling back stating they are performing two teeth extractions on tooth 3 and 21.

## 2021-08-26 NOTE — Telephone Encounter (Signed)
Returned the call to Janett Billow, she has been made aware that pt has already been cleared.  She had the clearance in her hand, she read it to me.

## 2021-09-06 ENCOUNTER — Ambulatory Visit (INDEPENDENT_AMBULATORY_CARE_PROVIDER_SITE_OTHER): Payer: Medicare Other | Admitting: Pharmacist

## 2021-09-06 DIAGNOSIS — M329 Systemic lupus erythematosus, unspecified: Secondary | ICD-10-CM

## 2021-09-06 DIAGNOSIS — E785 Hyperlipidemia, unspecified: Secondary | ICD-10-CM

## 2021-09-06 DIAGNOSIS — I10 Essential (primary) hypertension: Secondary | ICD-10-CM

## 2021-09-06 DIAGNOSIS — E119 Type 2 diabetes mellitus without complications: Secondary | ICD-10-CM

## 2021-09-06 DIAGNOSIS — Z8673 Personal history of transient ischemic attack (TIA), and cerebral infarction without residual deficits: Secondary | ICD-10-CM

## 2021-09-08 NOTE — Chronic Care Management (AMB) (Signed)
Chronic Care Management Pharmacy Note  09/08/2021 Name:  Amanda Saunders MRN:  [id number] DOB:  1956-06-28   Subjective: Amanda Saunders is an 66 y.o. year old female who is a primary patient of Copland, Gay Filler, MD.  The CCM team was consulted for assistance with disease management and care coordination needs.    Engaged with patient by telephone for follow up visit in response to provider referral for pharmacy case management and/or care coordination services.   Consent to Services:  The patient was given information about Chronic Care Management services, agreed to services, and gave verbal consent prior to initiation of services.  Please see initial visit note for detailed documentation.   Patient Care Team: Copland, Gay Filler, MD as PCP - General (Family Medicine) Stanford Breed Denice Bors, MD as PCP - Cardiology (Cardiology) Pieter Partridge, DO as Consulting Physician (Neurology) Rosita Kea, PA-C (Inactive) (Rheumatology) Cherre Robins, RPH-CPP (Pharmacist)  Recent office visits: 07/29/2021 - PCP (Dr Lorelei Pont) seen for acute visit for acute bronchitis. Prescribed doxycycline and fluconazole 01/28/2021 - PCP (Dr Lorelei Pont) seen for type 2 DM (new diagnosis); added metformin 250 to 516m daily at bedtime; Increased gabapentin fro 6068mqd to bid.   Recent consult visits: 05/29/2021 - Ortho (Dr XuErlinda HongBilateral OA of knee. Received joint injection of hyaluronate in both knees.   05/01/2021 - Ortho (Dr XuErlinda HongSeen for bilateral OA of knee. Plan to administer VIsco / hyaluronate to knee joint if approved. Continued Voltaren gel and glucosamine supplement 03/11/2021 - Cardio (Dr CrStanford BreedF/U cardiomyopathy. No med changes. Ordered repeat ECHO and labs 03/08/2021 - Ortho (Dr HiJunius RoadsKnee pain. Recommended VOltaren gel and glucosamine. Leg strengthening exercises and medial compartment uploading braces (patient ultimately was unable to afford these braces) 02/01/2021 - Neuro (Dr JaTomi Likensf/u  headaches; suspects HAs due to medication over use - taking Tylenol daily. Recommended changing gabapentin to antidepressant like nortriptyline but patient declined as she wished to continue wiht gabapentin. Recommended liming anagesics to no more than 2 days per week. 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 BP was slightly elevated at visti. HCTZ increased from 2554mvery other day to 41m42mily. Sleep study ordered due to report of snoring and not feeling well rested in AM  Hospital visits: [facility]01/2021 - [facility]. Prescribed molnupiravir 800mg24mce a day for 5 days and ondansetron to use as needed for nausea  Objective:  Lab Results  Component Value Date   CREATININE 1.04 (H) 06/01/2021   CREATININE 0.94 03/11/2021   CREATININE 0.96 11/23/2020    Lab Results  Component Value Date   HGBA1C 6.4 07/29/2021   Last diabetic Eye exam: No results found for: HMDIABEYEEXA  Last diabetic Foot exam: No results found for: HMDIABFOOTEX      Component Value Date/Time   CHOL 125 03/11/2021 0929   TRIG 88 03/11/2021 0929   HDL 42 03/11/2021 0929   CHOLHDL 3.0 03/11/2021 0929   CHOLHDL 3 06/08/2020 0925   VLDL 15.2 06/08/2020 0925   LDLCALC 66 03/11/2021 0929    Hepatic Function Latest Ref Rng & Units 03/11/2021 07/16/2020 02/23/2020  Total Protein 6.0 - 8.5 g/dL 7.5 7.0 7.1  Albumin 3.8 - 4.8 g/dL 4.6 4.2 4.3  AST 0 - 40 IU/L 36 26 32  ALT 0 - 32 IU/L 41(H) 40(H) 42(H)  Alk Phosphatase 44 - 121 IU/L 80 73 80  Total Bilirubin 0.0 - 1.2 mg/dL <0.2 0.3 0.3  Bilirubin, Direct 0.0 - 0.3 mg/dL - - -    Lab Results  Component Value Date/Time   TSH 2.68 08/04/2019 11:31 AM   TSH 4.08 02/03/2018 11:20 AM   FREET4 0.75 02/03/2018 11:20 AM    CBC Latest Ref Rng & Units 06/01/2021 03/11/2021 07/16/2020  WBC 4.0 - 10.5 K/uL 5.9 6.8 6.8  Hemoglobin 12.0 - 15.0 g/dL 13.0 13.1 13.3   Hematocrit 36.0 - 46.0 % 39.1 40.5 40.3  Platelets 150 - 400 K/uL 240 225 222.0    Lab Results  Component Value Date/Time   VD25OH 25.88 (L) 04/02/2016 12:00 PM    Clinical ASCVD: Yes  The ASCVD Risk score (Arnett DK, et al., 2019) failed to calculate for the following reasons:   The patient has a prior MI or stroke diagnosis      Social History   Tobacco Use  Smoking Status Former   Types: Cigarettes   Quit date: 07/21/2004   Years since quitting: 17.1  Smokeless Tobacco Never   BP Readings from Last 3 Encounters:  07/29/21 110/65  06/26/21 112/60  06/26/21 120/75   Pulse Readings from Last 3 Encounters:  07/29/21 63  06/26/21 72  06/26/21 77   Wt Readings from Last 3 Encounters:  07/29/21 189 lb 3.2 oz (85.8 kg)  06/26/21 189 lb 12.8 oz (86.1 kg)  06/26/21 189 lb 9.6 oz (86 kg)    Assessment: Review of patient past medical history, allergies, medications, health status, including review of consultants reports, laboratory and other test data, was performed as part of comprehensive evaluation and provision of chronic care management services.   SDOH:  (Social Determinants of Health) assessments and interventions performed:  SDOH Interventions    Flowsheet Row Most Recent Value  SDOH Interventions   Financial Strain Interventions Intervention Not Indicated       CCM Care Plan  Allergies  Allergen Reactions   Codeine Nausea And Vomiting    Medications Reviewed Today     Reviewed by Cherre Robins, RPH-CPP (Pharmacist) on 09/06/21 at 1344  Med List Status: <None>   Medication Order Taking? Sig Documenting Provider Last Dose Status Informant  diclofenac Sodium (VOLTAREN) 1 % GEL [id number] Yes Apply 4 g topically 4 (four) times daily as needed. Hilts, Legrand Como, MD Taking Active   ELIQUIS 5 MG TABS tablet [id number] Yes TAKE ONE TABLET BY MOUTH EVERY MORNING and TAKE ONE TABLET BY MOUTH AT BEDTIME Lelon Perla, MD Taking Active   gabapentin (NEURONTIN)  600 MG tablet [id number] Yes TAKE ONE TABLET BY MOUTH EVERY MORNING and TAKE ONE TABLET BY MOUTH EVERYDAY AT BEDTIME Jaffe, Adam R, DO Taking Active   hydrochlorothiazide (HYDRODIURIL) 25 MG tablet [id number] Yes Take 1 tablet (25 mg total) by mouth daily. Darreld Mclean, PA-C Taking Active   hydroxychloroquine (PLAQUENIL) 200 MG tablet [id number] Yes Take 1 tablet (200 mg total) by mouth 2 (two) times daily. Copland, Gay Filler, MD Taking Active   losartan (COZAAR) 50 MG tablet [id number] Yes TAKE ONE TABLET BY MOUTH EVERY MORNING Copland, Gay Filler, MD Taking Active   meclizine (ANTIVERT) 25 MG tablet [id number] Yes TAKE ONE TABLET BY MOUTH twice daily AS NEEDED FOR dizziness Copland, Gay Filler, MD Taking Active   metFORMIN (GLUCOPHAGE) 500 MG tablet [id number] Yes Take 0.5-1 tablets (250-500 mg total) by mouth at bedtime.  Patient taking differently: Take 500 mg by mouth at bedtime.   Copland, Gay Filler, MD Taking  Active   metoprolol succinate (TOPROL-XL) 25 MG 24 hr tablet [id number] Yes TAKE ONE TABLET BY MOUTH EVERY MORNING Copland, Gay Filler, MD Taking Active   omeprazole (PRILOSEC) 40 MG capsule [id number] Yes TAKE ONE CAPSULE BY MOUTH ONCE DAILY Copland, Gay Filler, MD Taking Active   ondansetron (ZOFRAN ODT) 8 MG disintegrating tablet [id number] Yes Take 1 tablet (8 mg total) by mouth every 8 (eight) hours as needed for nausea or vomiting. Molpus, John, MD Taking Active   rosuvastatin (CRESTOR) 20 MG tablet [id number] Yes TAKE ONE TABLET BY MOUTH EVERYDAY AT BEDTIME Copland, Gay Filler, MD Taking Active   sucralfate (CARAFATE) 1 g tablet [id number] Yes TAKE ONE TABLET BY MOUTH FOUR TIMES DAILY with meals AND AT BEDTIME Copland, Gay Filler, MD Taking Active            Med Note Cherre Robins B   Fri Sep 06, 2021  1:44 PM) Takes as needed.  traMADol (ULTRAM) 50 MG tablet [id number] No Take 1 tablet (50 mg total) by mouth every 8 (eight) hours as needed.  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   Systemic lupus erythematosus ([place]) 08/04/2019   Acute ischemic stroke ([place]) 08/14/2018   HTN (hypertension) 08/14/2018   Controlled type 2 diabetes mellitus without complication, without long-term current use of insulin ([place]) 04/03/2016   History of CVA (cerebrovascular accident) 03/12/2016   Chest pain at rest 03/12/2016   Peripheral edema 03/12/2016    Immunization History  Administered Date(s) Administered   Moderna Sars-Covid-2 Vaccination 11/17/2019, 12/15/2019, 07/06/2020   PNEUMOCOCCAL CONJUGATE-20 01/28/2021   Tdap 03/12/2016    Conditions to be addressed/monitored: HTN, HLD, DMII, and cardiomyopathy; ischemic stroke; lupus; chronic headaches  Care Plan : General Pharmacy (Adult)  Updates made by Cherre Robins, RPH-CPP since 09/08/2021 12:00 AM     Problem: Medication and Chronic Care Management   Priority: Medium  Onset Date: 11/28/2020  Note:   Current Barriers:  Unable to independently monitor therapeutic efficacy Unable to maintain control of hypertension Chronic Disease Management support, education, and care coordination needs related to Pre-Diabetes, Hypertension, Hx of CVA, Noncompaction Cardiomyopathy, Lupus, Gout, Pain  Pharmacist Clinical Goal(s):  Over the next 90 days, patient will achieve adherence to monitoring guidelines and medication adherence to achieve therapeutic efficacy maintain control of HTN as evidenced by BP < 140/90  adhere to prescribed medication regimen as evidenced by fill history  through collaboration with PharmD and provider.   Interventions: 1:1 collaboration with Copland, Gay Filler, MD regarding development and update of comprehensive plan of care as evidenced by provider attestation and co-signature Inter-disciplinary care team collaboration (see longitudinal plan of  care) Comprehensive medication review performed; medication list updated in electronic medical record   Hypertension / cardiomyopathy / swelling: BP has improved with increase in HCTZ in May 2022. Last 2 office BP reading have been at goal;  BP goal <140/90 BP Readings from Last 3 Encounters:  07/29/21 110/65  06/26/21 112/60  06/26/21 120/75  Current regimen:  Losartan 5m daily Hydrochlorothiazide 281mevery day  Metoprolol succinate 2561maily Home BP readings: 120 to 130 / 70's but states she does not check regularly.  LEE improved with increase in hydrochlorothiazide Patient reports occasional dizziness.   Interventions: Requested patient to check blood pressure 2 times per  week and also to start checking when she feels dizzy to make sure not having low blood pressure.  Discussed weighing daily; patient to consider using [facility] quarterly $40 OTC benefits to purchase scale Ensure daily salt intake < 2300 mg/day Continue current regimen for hypertension  Hyperlipidemia / history of ischemic stroke Controlled; LDL goal < 70 Denies sign and symptoms of bleeding.  Patient reports cost of Eliquis is usually $3.95 and not issues with coverage gap Current regimen:  Rosuvastatin 42m daily Eliquis 5109mtwice a day Interventions: Review last lipid panel and LDL goal Recommended maintain cholesterol medication regimen.  Type 2 Diabetes Last A1c was 6.4%; A1c goal <6.5% Current regimen:  Metformin 50045m take 1 tablet daily at bedtime Not currently checking BG at home. Not really necessary at this time.  Tolerating metformin well. Denies diarrhea.   Patient is limiting intake of sugar containing foods and carbohydrates; Has stopped drinking sodas and has increased intake of non starchy vegetable like tomatoes and cucumbers.  Exercise - about 10 minutes a few days per week Interventions Continue to limit sugar and carbohydrate intake.  Continue to take metformin 500m89maily with evening meal Recommended increase physical activity - goal is 150 minutes per week.  Discussed A1c goal Discussed yearly [facility] to be faxed to our office.  Abdominal Pain / history of ulcer: Patient was advised to avoid using naproxen due to GI issues and also by neurologist due to medication overuse headaches.  [place]2020 Current regimen:  Omeprazole 40mg55mly  Sucralfate 1 gram 4 times a day Interventions:  Recommended she continue to avoid naprosyn and other over the counter anti-inflammatory medications (Naproxen, ibuprofen, Aleve, Motrin, Advil) Can use Tylenol / acetaminophen for pain, headache, fever if needed (max of 3000mg 80my) Continue current regimen   Headaches: Improving: Goal: decrease frequency of headaches At last visit Dr Jaffe Tomi Likenso) recommended limiting Tylenol use to 2 or fewer doses per week to prevent rebound headaches Dr Jaffe Tomi Likensrecommended trial of nortriptyline in place of gabapentin for HA prevention but patient declined.  Current regimen:  Tylenol - as needed; limit to no more than 2 doses per week Gabapentin 600mg e42mmorning and at bedtime Methocarbamol 500mg - 33m 1 tablet every 8 hours if needed for muscle spasms / headache Interventions: Reviewed recommendation from Dr Jaffe's Georgie Chardsit Discussed pros and cons of nortriptyline trial - patient declines at this time as headaches improving. Patient self care activities - Over the next 180 days, patient will: Can use Tylenol / acetaminophen up to 2 times per week per Dr Jaffe CoTomi Likense other medications for headache prevention / treatment  SLE / RA:  Managed by rheumatology practice - Dr Beekman Amil Amenn [facility] and slightly elevated ALT when last CMP checked 03/11/2021 - AST was WNL; ALT was slightly elevated at 41  CMP Latest  Ref Rng & Units 06/01/2021 03/11/2021 11/23/2020  Glucose 70 - 99 mg/dL 110(H) 87 95  BUN 8 - 23 mg/dL _0 Creatinine 0.44 - 1.00 mg/dL 1.04(H) 0.94 0.96  Sodium 135 - 145 mmol/L 137 144 143  Potassium 3.5 - 5.1 mmol/L 3.5 4.7 4.4  Chloride 98 - 111 mmol/L 98 102 105  CO2 22 - 32 mmol/L _1 Calcium 8.9 - 10.3 mg/dL 8.9  9.2 9.1  Total Protein 6.0 - 8.5 g/dL - 7.5 -  Total Bilirubin 0.0 - 1.2 mg/dL - <0.2 -  Alkaline Phos 44 - 121 IU/L - 80 -  AST 0 - 40 IU/L - 36 -  ALT 0 - 32 IU/L - 41(H) -   Current regimen:  Hydroxychloroquine 269m twice a day Benlysta infusion  Medications tried in past: Orencia injection - no effective Avoid TFN's due to cardiomyopathy   Avoid JAK inhibitors due to history of ischemic stroke Interventions:  Updated medication list and verified current therapy Continue to follow up with rheumatology  Medication management Pharmacist Clinical Goal(s): Over the next 180 days, patient will work with PharmD and providers to maintain optimal medication adherence [facility] Comprehensive medication review performed. Continue to use current pharmacy for medication synchronizing and packaging.  Patient Goals/Self-Care Activities Over the next 90 days, patient will:  take medications as prescribed,  check blood pressure 2 times per week and when you experience dizziness. Document, and provide at future appointments. Contact office if blood pressure is less than 100/60 or above 140/90.  weigh daily, and contact provider if weight gain of more than 3lbs in 24 hours or 5 lbs in 1 week. Start exercise program with goal to work up to at least 150 minutes of exercise per week.   Follow Up Plan: Telephone follow up appointment with care management team member scheduled for:  3 months      Medication Assistance: None required.  Patient affirms current coverage meets needs.  Patient's preferred pharmacy is:  [facility] [place]  [place] [address] [place]: [phone]: [phone] [place] [address] [place] [address]Phone: [phone]: [phone] [place]Phone: [phone]: [phone]   Follow Up:  Patient agrees to Care Plan and Follow-up.  Plan: Telephone follow up appointment with care management team member scheduled for:  3 to 4 months  TCherre Robins PharmD Clinical Pharmacist [facility]

## 2021-09-08 NOTE — Patient Instructions (Signed)
Amanda Saunders, It was a pleasure speaking with you today.  I have attached a summary of our visit today and information about your health goals.    Patient Goals/Self-Care Activities Over the next 90 days, patient will:  take medications as prescribed,  check blood pressure 2 times per week and when you experience dizziness. Document, and provide at future appointments. Contact office if blood pressure is less than 100/60 or above 140/90.  weigh daily, and contact provider if weight gain of more than 3lbs in 24 hours or 5 lbs in 1 week. Start exercise program with goal to work up to at least 150 minutes of exercise per week.    If you have any questions or concerns, please feel free to contact me either at the phone number below or with a MyChart message.   Keep up the good work!  Cherre Robins, PharmD Clinical Pharmacist [place] [phone] (direct line)  [phone] (main office number)   Patient verbalizes understanding of instructions and care plan provided today and agrees to view in [place]. Active MyChart status confirmed with patient.

## 2021-09-16 DIAGNOSIS — M329 Systemic lupus erythematosus, unspecified: Secondary | ICD-10-CM | POA: Diagnosis not present

## 2021-09-16 DIAGNOSIS — M0609 Rheumatoid arthritis without rheumatoid factor, multiple sites: Secondary | ICD-10-CM | POA: Diagnosis not present

## 2021-09-16 DIAGNOSIS — R945 Abnormal results of liver function studies: Secondary | ICD-10-CM | POA: Diagnosis not present

## 2021-09-17 DIAGNOSIS — E119 Type 2 diabetes mellitus without complications: Secondary | ICD-10-CM | POA: Diagnosis not present

## 2021-09-17 DIAGNOSIS — I10 Essential (primary) hypertension: Secondary | ICD-10-CM | POA: Diagnosis not present

## 2021-09-17 DIAGNOSIS — E785 Hyperlipidemia, unspecified: Secondary | ICD-10-CM

## 2021-09-18 DIAGNOSIS — M329 Systemic lupus erythematosus, unspecified: Secondary | ICD-10-CM | POA: Diagnosis not present

## 2021-09-21 ENCOUNTER — Encounter: Payer: Self-pay | Admitting: Family Medicine

## 2021-09-23 ENCOUNTER — Encounter: Payer: Self-pay | Admitting: Family Medicine

## 2021-09-23 ENCOUNTER — Ambulatory Visit (INDEPENDENT_AMBULATORY_CARE_PROVIDER_SITE_OTHER): Payer: Medicare Other | Admitting: Family Medicine

## 2021-09-23 VITALS — BP 110/60 | HR 66 | Temp 98.0°F | Ht 63.0 in | Wt 197.6 lb

## 2021-09-23 DIAGNOSIS — I7300 Raynaud's syndrome without gangrene: Secondary | ICD-10-CM | POA: Diagnosis not present

## 2021-09-23 LAB — TSH: TSH: 2.45 u[IU]/mL (ref 0.35–5.50)

## 2021-09-23 LAB — BASIC METABOLIC PANEL
BUN: 18 mg/dL (ref 6–23)
CO2: 32 mEq/L (ref 19–32)
Calcium: 9.1 mg/dL (ref 8.4–10.5)
Chloride: 103 mEq/L (ref 96–112)
Creatinine, Ser: 1.01 mg/dL (ref 0.40–1.20)
GFR: 58.83 mL/min — ABNORMAL LOW (ref >60.00)
Glucose, Bld: 93 mg/dL (ref 70–99)
Potassium: 4.4 mEq/L (ref 3.5–5.1)
Sodium: 143 mEq/L (ref 135–145)

## 2021-09-23 NOTE — Progress Notes (Addendum)
Therapist, music at [facility] ?[address], [address] ?[place], [place] [address] ?336 [phone] ?Fax [phone] ? ?Date:  09/23/2021  ? ?Name:  Amanda Saunders   DOB:  [birth year]   MRN:  [id number] ? ?PCP:  Darreld Mclean, MD  ? ? ?Chief Complaint: right hand turned pain  (Fingers numb and tingling ) ? ? ?History of Present Illness: ? ?Amanda Saunders is a 66 y.o. very pleasant female patient who presents with the following: ? ?Pt seen today with concern of hand sx ?She contacted me over the weekend with the following concern: ?I apologize for texting you on a Sat but I have a concern; my hands especially my fingers have been getting  numb & tingling lately and the other day my right hand was pale & my finger tips looked like they were turning blue; ?this started about a week ago; I'm concerned as to what's going on.  ? ? ?She first noticed the above-mentioned symptoms in her right hand while she was donating plasma about a week ago- her fingertips were blue.  She also has noted some numbness and tinging in the fingertips of both hands-this will come and go ?She noted it was quite cold in the plasma donation canter ?She noted a sharp line of demarcation where her fingertips turn blue ?She may get some tingling of her toes as well  ?She has noticed this tingling for perhaps 2 - 3 weeks ?Hands are not weak or painful ?No other neurological symptoms, no other numbness or weakness in any part of her body, no slurred speech or unusual headache ? ?History of lupus and RA, cardiomyopathy, HTN, DM, Stroke  ?Last seen by myself in January for follow-up  ? 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  We will continue ARB and beta-blocker.  She has some dyspnea on exertion that does not appear to be particularly limiting.  Repeat echocardiogram.  She had a CVA previously which may have been related to intracranial vascular disease but could also have been  related to noncompaction.  Continue apixaban.  Check hgb and renal function. Note she does not have a history of syncope or family history of sudden cardiac death. 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  Continue ARB and beta-blocker.  No evidence of CHF on examination.  Repeat echocardiogram. ?3 prior CVA-continue apixaban. ?4 hyperlipidemia-continue statin.  Check lipids and liver. ? ? ?She is seeing GI later on this month  ?Lab Results  ?Component Value Date  ? HGBA1C 6.4 07/29/2021  ? ? ? ?Patient Active Problem List  ? Diagnosis Date Noted  ? 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 Systemic lupus erythematosus ([place]) 08/04/2019  ? Acute ischemic stroke ([place]) 08/14/2018  ? HTN (hypertension) 08/14/2018  ? Controlled type 2 diabetes mellitus without complication, without long-term current use of insulin ([facility]) 04/03/2016  ? History of CVA (cerebrovascular accident) 03/12/2016  ? Chest pain at rest 03/12/2016  ? Peripheral edema 03/12/2016  ? ? ?Past Medical History:  ?Diagnosis Date  ? Cardiomyopathy ([place])   ? Hypertension   ? Lupus ([place])   ? PUD (peptic ulcer disease)   ? Stroke [facility]) 08/13/2018  ? Vertigo   ? ? ?Past Surgical History:  ?Procedure Laterality Date  ? BACK SURGERY    ? CARPAL TUNNEL RELEASE    ? KNEE CARTILAGE SURGERY    ? NECK SURGERY    ? 2015  ? ? ?Social  History  ? ?Tobacco Use  ? Smoking status: Former  ?  Types: Cigarettes  ?  Quit date: 07/21/2004  ?  Years since quitting: 17.1  ? Smokeless tobacco: Never  ?Vaping Use  ? Vaping Use: Never used  ?Substance Use Topics  ? Alcohol use: Yes  ?  Comment: occasional  ? Drug use: No  ? ? ?Family History  ?Problem Relation Age of Onset  ? Hypertension Mother   ? Arthritis Mother   ? Heart failure Mother   ? Stroke Mother   ? Kidney disease Father   ? Hypertension Father   ? ? ?Allergies  ?Allergen Reactions  ? Codeine Nausea And Vomiting  ? ? ?Medication list has been reviewed and updated. ? ?Current Outpatient Medications on File  Prior to Visit  ?Medication Sig Dispense Refill  ? diclofenac Sodium (VOLTAREN) 1 % GEL Apply 4 g topically 4 (four) times daily as needed. 500 g 6  ? ELIQUIS 5 MG TABS tablet TAKE ONE TABLET BY MOUTH EVERY MORNING and TAKE ONE TABLET BY MOUTH AT BEDTIME 180 tablet 1  ? gabapentin (NEURONTIN) 600 MG tablet TAKE ONE TABLET BY MOUTH EVERY MORNING and TAKE ONE TABLET BY MOUTH EVERYDAY AT BEDTIME 60 tablet 2  ? hydrochlorothiazide (HYDRODIURIL) 25 MG tablet Take 1 tablet (25 mg total) by mouth daily. 90 tablet 3  ? hydroxychloroquine (PLAQUENIL) 200 MG tablet Take 1 tablet (200 mg total) by mouth 2 (two) times daily. 30 tablet 0  ? losartan (COZAAR) 50 MG tablet TAKE ONE TABLET BY MOUTH EVERY MORNING 90 tablet 3  ? metFORMIN (GLUCOPHAGE) 500 MG tablet Take 0.5-1 tablets (250-500 mg total) by mouth at bedtime. 90 tablet 3  ? metoprolol succinate (TOPROL-XL) 25 MG 24 hr tablet TAKE ONE TABLET BY MOUTH EVERY MORNING 90 tablet 3  ? omeprazole (PRILOSEC) 40 MG capsule TAKE ONE CAPSULE BY MOUTH ONCE DAILY 90 capsule 1  ? ondansetron (ZOFRAN ODT) 8 MG disintegrating tablet Take 1 tablet (8 mg total) by mouth every 8 (eight) hours as needed for nausea or vomiting. 10 tablet 0  ? rosuvastatin (CRESTOR) 20 MG tablet TAKE ONE TABLET BY MOUTH EVERYDAY AT BEDTIME 90 tablet 3  ? sucralfate (CARAFATE) 1 g tablet TAKE ONE TABLET BY MOUTH FOUR TIMES DAILY with meals AND AT BEDTIME 40 tablet 3  ? traMADol (ULTRAM) 50 MG tablet Take 1 tablet (50 mg total) by mouth every 8 (eight) hours as needed. 30 tablet 0  ? meclizine (ANTIVERT) 25 MG tablet TAKE ONE TABLET BY MOUTH twice daily AS NEEDED FOR dizziness (Patient not taking: Reported on 09/23/2021) 90 tablet 1  ? ?No current facility-administered medications on file prior to visit.  ? ? ?Review of Systems: ? ?As per HPI- otherwise negative. ? ? ?Physical Examination: ?Vitals:  ? 09/23/21 0951  ?BP: 110/60  ?Pulse: 66  ?Temp: 98 ?F (36.7 ?C)  ?SpO2: 99%  ? ?Vitals:  ? 09/23/21 0951  ?Weight:  197 lb 9.6 oz (89.6 kg)  ?Height: '5\' 3"'$  (1.6 m)  ? ?Body mass index is 35 kg/m?. ?Ideal Body Weight: Weight in (lb) to have BMI = 25: 140.8 ? ?GEN: no acute distress.  Obese, looks well ?HEENT: Atraumatic, Normocephalic.  ?Ears and Nose: No external deformity. ?CV: RRR, No M/G/R. No JVD. No thrill. No extra heart sounds. ?PULM: CTA B, no wheezes, crackles, rhonchi. No retractions. No resp. distress. No accessory muscle use. ?EXTR: No c/c/e ?PSYCH: Normally interactive. Conversant.  ?Hands and feet are warm  and well perfused with normal pulses.  No evidence of ischemia, gangrene or other changes.  Normal strength and range of motion of her hands ? ?Assessment and Plan: ?Raynaud's phenomenon without gangrene - Plan: TSH, Basic metabolic panel ? ?Patient seen today with likely Raynaud's phenomenon, she does have history of lupus which is a risk factor ?We will check a TSH to rule out any contribution of thyroid abnormality ?It may help her to add a calcium channel blocker.  Message sent to her cardiologist regarding possibly changing one of her medicines to amlodipine ?Counseled patient about preventing cold exposure, wear gloves or mittens as needed ?Patient states  understanding and feels reassured ? ?Signed ?Lamar Blinks, MD ? ?Received patient labs as below, message to patient ? ?Results for orders placed or performed in visit on 09/23/21  ?TSH  ?Result Value Ref Range  ? TSH 2.45 0.35 - 5.50 uIU/mL  ?Basic metabolic panel  ?Result Value Ref Range  ? Sodium 143 135 - 145 mEq/L  ? Potassium 4.4 3.5 - 5.1 mEq/L  ? Chloride 103 96 - 112 mEq/L  ? CO2 32 19 - 32 mEq/L  ? Glucose, Bld 93 70 - 99 mg/dL  ? BUN 18 6 - 23 mg/dL  ? Creatinine, Ser 1.01 0.40 - 1.20 mg/dL  ? GFR 58.83 (L) >60.00 mL/min  ? Calcium 9.1 8.4 - 10.5 mg/dL  ? ?I did hear back from her cardiologist, Dr. Stanford Breed.  He would like patient to continue her ARB, could potentially add a small dose of amlodipine if needed.  I will pass this along to  patient ? ?

## 2021-09-23 NOTE — Patient Instructions (Addendum)
It was good to see you again today ?I think you likely have Raynaud's phenomenon which is a common disorder especially in people who have autoimmune disease ?Taking a different type of BP med may help- let me touch base with Dr Stanford Breed about this ?Avoid letting your hands get cold!  I will check your thyroid today as well  ? ?Please let me know if getting worse or if any changes ?Please consider getting the shingles vaccine series and covid booster at your pharmacy at your convenience  ?

## 2021-09-23 NOTE — Progress Notes (Deleted)
Therapist, music at [facility] [employer] ?[address], [address] ?[place], [place] [address] ?336 [phone] ?Fax [phone] ? ?Date:  09/23/2021  ? ?Name:  Amanda Saunders   DOB:  06-03-1956   MRN:  [id number] ? ?PCP:  Darreld Mclean, MD  ? ? ?Chief Complaint: No chief complaint on file. ? ? ?History of Present Illness: ? ?Amanda Saunders is a 66 y.o. very pleasant female patient who presents with the following: ? ?Pt seen today with concern of hand sx ?She contacted me over the weekend with the following concern: ?I apologize for texting you on a Sat but I have a concern; my hands especially my fingers have been getting  numb & tingling lately and the other day my right hand was pale & my finger tips looked like they were turning blue; ?this started about a week ago; I'm concerned as to what's going on.  ? ?History of lupus, cardiomyopathy, HTN, DM, Stroke  ? ?Lab Results  ?Component Value Date  ? HGBA1C 6.4 07/29/2021  ? ? ? ?Patient Active Problem List  ? Diagnosis Date Noted  ? 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 Systemic lupus erythematosus ([place]) 08/04/2019  ? Acute ischemic stroke ([place]) 08/14/2018  ? HTN (hypertension) 08/14/2018  ? Controlled type 2 diabetes mellitus without complication, without long-term current use of insulin ([place]) 04/03/2016  ? History of CVA (cerebrovascular accident) 03/12/2016  ? Chest pain at rest 03/12/2016  ? Peripheral edema 03/12/2016  ? ? ?Past Medical History:  ?Diagnosis Date  ? Cardiomyopathy ([place])   ? Hypertension   ? Lupus ([place])   ? PUD (peptic ulcer disease)   ? Stroke [facility]) 08/13/2018  ? Vertigo   ? ? ?Past Surgical History:  ?Procedure Laterality Date  ? BACK SURGERY    ? CARPAL TUNNEL RELEASE    ? KNEE CARTILAGE SURGERY    ? NECK SURGERY    ? 2015  ? ? ?Social History  ? ?Tobacco Use  ? Smoking status: Former  ?  Types: Cigarettes  ?  Quit date: 07/21/2004  ?  Years since quitting: 17.1  ? Smokeless tobacco: Never  ?Vaping Use  ? Vaping Use:  Never used  ?Substance Use Topics  ? Alcohol use: Yes  ?  Comment: occasional  ? Drug use: No  ? ? ?Family History  ?Problem Relation Age of Onset  ? Hypertension Mother   ? Arthritis Mother   ? Heart failure Mother   ? Stroke Mother   ? Kidney disease Father   ? Hypertension Father   ? ? ?Allergies  ?Allergen Reactions  ? Codeine Nausea And Vomiting  ? ? ?Medication list has been reviewed and updated. ? ?Current Outpatient Medications on File Prior to Visit  ?Medication Sig Dispense Refill  ? diclofenac Sodium (VOLTAREN) 1 % GEL Apply 4 g topically 4 (four) times daily as needed. 500 g 6  ? ELIQUIS 5 MG TABS tablet TAKE ONE TABLET BY MOUTH EVERY MORNING and TAKE ONE TABLET BY MOUTH AT BEDTIME 180 tablet 1  ? gabapentin (NEURONTIN) 600 MG tablet TAKE ONE TABLET BY MOUTH EVERY MORNING and TAKE ONE TABLET BY MOUTH EVERYDAY AT BEDTIME 60 tablet 2  ? hydrochlorothiazide (HYDRODIURIL) 25 MG tablet Take 1 tablet (25 mg total) by mouth daily. 90 tablet 3  ? hydroxychloroquine (PLAQUENIL) 200 MG tablet Take 1 tablet (200 mg total) by mouth 2 (two) times daily. 30 tablet 0  ? losartan (COZAAR) 50 MG  tablet TAKE ONE TABLET BY MOUTH EVERY MORNING 90 tablet 3  ? meclizine (ANTIVERT) 25 MG tablet TAKE ONE TABLET BY MOUTH twice daily AS NEEDED FOR dizziness 90 tablet 1  ? metFORMIN (GLUCOPHAGE) 500 MG tablet Take 0.5-1 tablets (250-500 mg total) by mouth at bedtime. (Patient taking differently: Take 500 mg by mouth at bedtime.) 90 tablet 3  ? metoprolol succinate (TOPROL-XL) 25 MG 24 hr tablet TAKE ONE TABLET BY MOUTH EVERY MORNING 90 tablet 3  ? omeprazole (PRILOSEC) 40 MG capsule TAKE ONE CAPSULE BY MOUTH ONCE DAILY 90 capsule 1  ? ondansetron (ZOFRAN ODT) 8 MG disintegrating tablet Take 1 tablet (8 mg total) by mouth every 8 (eight) hours as needed for nausea or vomiting. 10 tablet 0  ? rosuvastatin (CRESTOR) 20 MG tablet TAKE ONE TABLET BY MOUTH EVERYDAY AT BEDTIME 90 tablet 3  ? sucralfate (CARAFATE) 1 g tablet TAKE ONE  TABLET BY MOUTH FOUR TIMES DAILY with meals AND AT BEDTIME 40 tablet 3  ? traMADol (ULTRAM) 50 MG tablet Take 1 tablet (50 mg total) by mouth every 8 (eight) hours as needed. (Patient not taking: Reported on 09/06/2021) 30 tablet 0  ? ?No current facility-administered medications on file prior to visit.  ? ? ?Review of Systems: ? ?As per HPI- otherwise negative. ? ? ?Physical Examination: ?There were no vitals filed for this visit. ?There were no vitals filed for this visit. ?There is no height or weight on file to calculate BMI. ?Ideal Body Weight:   ? ?GEN: no acute distress. ?HEENT: Atraumatic, Normocephalic.  ?Ears and Nose: No external deformity. ?CV: RRR, No M/G/R. No JVD. No thrill. No extra heart sounds. ?PULM: CTA B, no wheezes, crackles, rhonchi. No retractions. No resp. distress. No accessory muscle use. ?ABD: S, NT, ND, +BS. No rebound. No HSM. ?EXTR: No c/c/e ?PSYCH: Normally interactive. Conversant.  ? ? ?Assessment and Plan: ?*** ? ?Signed ?Lamar Blinks, MD ? ?

## 2021-09-24 ENCOUNTER — Encounter: Payer: Self-pay | Admitting: Family Medicine

## 2021-09-29 ENCOUNTER — Encounter: Payer: Self-pay | Admitting: Neurology

## 2021-10-14 ENCOUNTER — Encounter: Payer: Self-pay | Admitting: Family Medicine

## 2021-10-14 NOTE — Progress Notes (Signed)
? ?NEUROLOGY FOLLOW UP OFFICE NOTE ? ?Amanda Saunders ?[id number] ? ?Assessment/Plan:  ? ?Headache with disequilibrium - recurrence of habitual headache.   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  ?Titrate gabapentin to '800mg'$  twice daily ?Limit use of pain relievers to no more than 2 days out of week to prevent risk of rebound or medication-overuse headache. ?Keep headache diary ?Secondary stroke prevention as managed by PCP: ?Follow up 5 months ?  ?  ?Subjective:  ?Amanda Saunders is a 66 year old left-handed woman with history of stroke who follows up for headache. ?  ?UPDATE: ?Last seen in December.  She was doing well at that time.  Had not had a headache in months.    Last month she started getting headaches again. It sounds like her habitual headaches with disequilibrium.  Unaware of any trigger.  They are daily.  Treating with Tylenol daily.  They last most of the day.  Had 2 teeth pulled yesterday.   ?  ?Current NSAIDs:  ASA '81mg'$ , Motrin ?Current analgesic:  Tylenol ?Current muscle relaxant:  none ?Current antihypertensive:  HCTZ, losartan, Toprol XL ?Current antiepileptic:  gabapentin '600mg'$  twice daily ?Current antihistamine:  Meclizine ?Other medication:  Eliquis ?  ?HISTORY: ?Headache: ?On 03/03/19, she developed a sudden severe pounding right sided headache (radiating across forehead) with right facial burning.  There was associated nausea, sometimes vomiting, photophobia and phonophobia.  No visual disturbance.  She felt that her equilibrium was off.  She went to the ED on 03/11/19 for further evaluation. CT head personally reviewed and negative for acute intracranial abnormality such as hemorrhage or infarct.  CBC and BMP were unremarkable.  She was given a headache cocktail of Reglan and Benadryl and discharged home.  Headaches are still present but not as severe.  She reported temperature of 99.  They lessened on  8/24.  Headache is mild to moderate, lasting a couple of hours off and on, daily.  Sometimes she feels shaking in her head.  Reports a little bit of neck pain.   ?  ?04/17/2019 MRI w wo and MRA of head: advanced widespread chronic small vessel ischemic changes as well as occlusion of right distal M1 segment and severe stenoses of left M2 branches, A2 branches and mild irregularity of the PCA branches, but no acute intracranial abnormality. ?04/13/2019 Sed Rate 11. ?  ?History of CVA: ?She was admitted to [facility] on 08/14/18 for increased left arm numbness and weakness with left sided tremor as well as headache, dizziness with nausea and vomiting.  CT of head was personally reviewed and showed no acute findings.  MRI of brain personally reviewed and demonstrated scattered acute watershed subcortical and small cortical infarcts in the right MCA/ACA and MCA/PCA areas.  MRA of head personally reviewed showed severe stenosis or short segment occlusion of the distal right MCA M1 and proximal M2 segments as well as proximal severe stenosis or short segment occlusion of left MCA M2 segment.  Carotid doppler showed no hemodynamically significant stenosis.  2D echocardiogram showed EF 45-50%.  LDL was 77.  Hgb A1c was 6.2.  ASA '81mg'$  daily was switched to ASA '325mg'$  and Plavix '75mg'$  daily for 3 months with plan to subsequently continue Plavix alone.  She was continued on atorvastatin '40mg'$  daily.   ?  ?Past medication:  tramadol, naproxen, Robaxin ? ?PAST MEDICAL HISTORY: ?Past Medical History:  ?Diagnosis Date  ? Cardiomyopathy ([place])   ? Hypertension   ?  Lupus ([place])   ? PUD (peptic ulcer disease)   ? Stroke [facility]) 08/13/2018  ? Vertigo   ? ? ?MEDICATIONS: ?Current Outpatient Medications on File Prior to Visit  ?Medication Sig Dispense Refill  ? diclofenac Sodium (VOLTAREN) 1 % GEL Apply 4 g topically 4 (four) times daily as needed. 500 g 6  ? ELIQUIS 5 MG TABS tablet TAKE ONE TABLET BY MOUTH EVERY MORNING and TAKE ONE  TABLET BY MOUTH AT BEDTIME 180 tablet 1  ? gabapentin (NEURONTIN) 600 MG tablet TAKE ONE TABLET BY MOUTH EVERY MORNING and TAKE ONE TABLET BY MOUTH EVERYDAY AT BEDTIME 60 tablet 2  ? hydrochlorothiazide (HYDRODIURIL) 25 MG tablet Take 1 tablet (25 mg total) by mouth daily. 90 tablet 3  ? hydroxychloroquine (PLAQUENIL) 200 MG tablet Take 1 tablet (200 mg total) by mouth 2 (two) times daily. 30 tablet 0  ? losartan (COZAAR) 50 MG tablet TAKE ONE TABLET BY MOUTH EVERY MORNING 90 tablet 3  ? meclizine (ANTIVERT) 25 MG tablet TAKE ONE TABLET BY MOUTH twice daily AS NEEDED FOR dizziness (Patient not taking: Reported on 09/23/2021) 90 tablet 1  ? metFORMIN (GLUCOPHAGE) 500 MG tablet Take 0.5-1 tablets (250-500 mg total) by mouth at bedtime. 90 tablet 3  ? metoprolol succinate (TOPROL-XL) 25 MG 24 hr tablet TAKE ONE TABLET BY MOUTH EVERY MORNING 90 tablet 3  ? omeprazole (PRILOSEC) 40 MG capsule TAKE ONE CAPSULE BY MOUTH ONCE DAILY 90 capsule 1  ? ondansetron (ZOFRAN ODT) 8 MG disintegrating tablet Take 1 tablet (8 mg total) by mouth every 8 (eight) hours as needed for nausea or vomiting. 10 tablet 0  ? rosuvastatin (CRESTOR) 20 MG tablet TAKE ONE TABLET BY MOUTH EVERYDAY AT BEDTIME 90 tablet 3  ? sucralfate (CARAFATE) 1 g tablet TAKE ONE TABLET BY MOUTH FOUR TIMES DAILY with meals AND AT BEDTIME 40 tablet 3  ? traMADol (ULTRAM) 50 MG tablet Take 1 tablet (50 mg total) by mouth every 8 (eight) hours as needed. 30 tablet 0  ? ?No current facility-administered medications on file prior to visit.  ? ? ?ALLERGIES: ?Allergies  ?Allergen Reactions  ? Codeine Nausea And Vomiting  ? ? ?FAMILY HISTORY: ?Family History  ?Problem Relation Age of Onset  ? Hypertension Mother   ? Arthritis Mother   ? Heart failure Mother   ? Stroke Mother   ? Kidney disease Father   ? Hypertension Father   ? ? ?  ?Objective:  ?Blood pressure 120/74, pulse 73, resp. rate 18, height '5\' 3"'$  (1.6 m), weight 195 lb (88.5 kg), SpO2 95 %. ?General: No acute  distress.  Patient appears well-groomed.   ?Head:  Normocephalic/atraumatic ?Eyes:  Fundi examined but not visualized ?Neck: supple, no paraspinal tenderness, full range of motion ?Heart:  Regular rate and rhythm ?Lungs:  Clear to auscultation bilaterally ?Back: No paraspinal tenderness ?Neurological Exam: alert and oriented to person, place, and time.  Speech fluent and not dysarthric, language intact.  CN II-XII intact. Bulk and tone normal, muscle strength 5/5 throughout.  Sensation to light touch intact.  Deep tendon reflexes 2+ throughout.  Finger to nose testing intact.  Gait normal, Romberg with sway. ? ? ?Metta Clines, DO ? ?CC: Lamar Blinks, MD ? ? ? ? ? ? ?

## 2021-10-15 ENCOUNTER — Encounter: Payer: Self-pay | Admitting: Neurology

## 2021-10-15 ENCOUNTER — Ambulatory Visit (INDEPENDENT_AMBULATORY_CARE_PROVIDER_SITE_OTHER): Payer: Medicare Other | Admitting: Neurology

## 2021-10-15 ENCOUNTER — Ambulatory Visit (INDEPENDENT_AMBULATORY_CARE_PROVIDER_SITE_OTHER): Payer: Medicare Other | Admitting: Gastroenterology

## 2021-10-15 ENCOUNTER — Encounter: Payer: Self-pay | Admitting: Gastroenterology

## 2021-10-15 ENCOUNTER — Telehealth: Payer: Self-pay

## 2021-10-15 VITALS — BP 118/60 | HR 57 | Ht 63.0 in | Wt 195.0 lb

## 2021-10-15 VITALS — BP 120/74 | HR 73 | Resp 18 | Ht 63.0 in | Wt 195.0 lb

## 2021-10-15 DIAGNOSIS — Z7901 Long term (current) use of anticoagulants: Secondary | ICD-10-CM | POA: Diagnosis not present

## 2021-10-15 DIAGNOSIS — I10 Essential (primary) hypertension: Secondary | ICD-10-CM | POA: Diagnosis not present

## 2021-10-15 DIAGNOSIS — Z8673 Personal history of transient ischemic attack (TIA), and cerebral infarction without residual deficits: Secondary | ICD-10-CM | POA: Diagnosis not present

## 2021-10-15 DIAGNOSIS — Z1211 Encounter for screening for malignant neoplasm of colon: Secondary | ICD-10-CM

## 2021-10-15 DIAGNOSIS — R519 Headache, unspecified: Secondary | ICD-10-CM | POA: Diagnosis not present

## 2021-10-15 DIAGNOSIS — I6529 Occlusion and stenosis of unspecified carotid artery: Secondary | ICD-10-CM | POA: Diagnosis not present

## 2021-10-15 DIAGNOSIS — Z01818 Encounter for other preprocedural examination: Secondary | ICD-10-CM | POA: Diagnosis not present

## 2021-10-15 DIAGNOSIS — Z1212 Encounter for screening for malignant neoplasm of rectum: Secondary | ICD-10-CM

## 2021-10-15 MED ORDER — GABAPENTIN 800 MG PO TABS: 800.0000 mg | ORAL_TABLET | Freq: Two times a day (BID) | ORAL | 5 refills | Status: DC

## 2021-10-15 MED ORDER — NA SULFATE-K SULFATE-MG SULF 17.5-3.13-1.6 GM/177ML PO SOLN: 1.0000 | Freq: Once | ORAL | 0 refills | Status: AC

## 2021-10-15 NOTE — Patient Instructions (Signed)
Increase gabapentin to '800mg'$  twice daily.  Take '600mg'$  pill in morning and '800mg'$  pill at night for one week, then switch to the '800mg'$  pill twice daily ?Follow up 5 months ?

## 2021-10-15 NOTE — Patient Instructions (Signed)
You have been scheduled for a colonoscopy. Please follow written instructions given to you at your visit today.  °Please pick up your prep supplies at the pharmacy within the next 1-3 days. °If you use inhalers (even only as needed), please bring them with you on the day of your procedure. ° °Due to recent changes in healthcare laws, you may see the results of your imaging and laboratory studies on MyChart before your provider has had a chance to review them.  We understand that in some cases there may be results that are confusing or concerning to you. Not all laboratory results come back in the same time frame and the provider may be waiting for multiple results in order to interpret others.  Please give us 48 hours in order for your provider to thoroughly review all the results before contacting the office for clarification of your results.  ° °The [facility] providers would like to encourage you to use MYCHART to communicate with providers for non-urgent requests or questions.  Due to long hold times on the telephone, sending your provider a message by MYCHART may be a faster and more efficient way to get a response.  Please allow 48 business hours for a response.  Please remember that this is for non-urgent requests.  ° °Thank you for choosing me and  Gastroenterology. ° °Malcolm T. Stark, Jr., MD., FACG ° °

## 2021-10-15 NOTE — Telephone Encounter (Signed)
Will route to pharm for input. Last OV 02/2021, Eliquis continued for CVA/noncompaction. ?

## 2021-10-15 NOTE — Telephone Encounter (Signed)
[facility] Pre-operative Risk Assessment  ?   ?Request for surgical clearance:     Endoscopy Procedure ? ?What type of surgery is being performed?     colonoscopy ? ?When is this surgery scheduled?     11/25/21 ? ?What type of clearance is required ?   Pharmacy ? ?Are there any medications that need to be held prior to surgery and how long? Eliquis x 2 days ? ?Practice name and name of physician performing surgery?      [facility] ? ?What is your office phone and fax number?      Phone- [phone]  Fax- [phone] ? ?Anesthesia type (None, local, MAC, general) ?       MAC ? ? ?

## 2021-10-15 NOTE — Progress Notes (Signed)
? ? ?History of Present Illness: This is a 66 year old female referred by Copland, Gay Filler, MD for the evaluation of CRC screening, average risk.  She relates she underwent colonoscopy in [place] about 15 years ago and she recalls the exam was normal.  She relates a history of an ulcer diagnosed in [place] around the same time.  She is maintained on omeprazole and sucralfate for GERD symptoms, dyspeptic symptoms, history of ulcer disease.  She currently has no gastrointestinal complaints. Denies weight loss, abdominal pain, constipation, diarrhea, change in stool caliber, melena, hematochezia, nausea, vomiting, dysphagia, reflux symptoms, chest pain. ? ? ? ?Allergies  ?Allergen Reactions  ? Codeine Nausea And Vomiting  ? ?Outpatient Medications Prior to Visit  ?Medication Sig Dispense Refill  ? diclofenac Sodium (VOLTAREN) 1 % GEL Apply 4 g topically 4 (four) times daily as needed. 500 g 6  ? ELIQUIS 5 MG TABS tablet TAKE ONE TABLET BY MOUTH EVERY MORNING and TAKE ONE TABLET BY MOUTH AT BEDTIME 180 tablet 1  ? gabapentin (NEURONTIN) 800 MG tablet Take 1 tablet (800 mg total) by mouth 2 (two) times daily. 60 tablet 5  ? hydrochlorothiazide (HYDRODIURIL) 25 MG tablet Take 1 tablet (25 mg total) by mouth daily. 90 tablet 3  ? hydroxychloroquine (PLAQUENIL) 200 MG tablet Take 1 tablet (200 mg total) by mouth 2 (two) times daily. 30 tablet 0  ? losartan (COZAAR) 50 MG tablet TAKE ONE TABLET BY MOUTH EVERY MORNING 90 tablet 3  ? meclizine (ANTIVERT) 25 MG tablet TAKE ONE TABLET BY MOUTH twice daily AS NEEDED FOR dizziness 90 tablet 1  ? metFORMIN (GLUCOPHAGE) 500 MG tablet Take 0.5-1 tablets (250-500 mg total) by mouth at bedtime. 90 tablet 3  ? metoprolol succinate (TOPROL-XL) 25 MG 24 hr tablet TAKE ONE TABLET BY MOUTH EVERY MORNING 90 tablet 3  ? omeprazole (PRILOSEC) 40 MG capsule TAKE ONE CAPSULE BY MOUTH ONCE DAILY 90 capsule 1  ? ondansetron (ZOFRAN ODT) 8 MG disintegrating tablet Take 1 tablet (8 mg  total) by mouth every 8 (eight) hours as needed for nausea or vomiting. 10 tablet 0  ? rosuvastatin (CRESTOR) 20 MG tablet TAKE ONE TABLET BY MOUTH EVERYDAY AT BEDTIME 90 tablet 3  ? sucralfate (CARAFATE) 1 g tablet TAKE ONE TABLET BY MOUTH FOUR TIMES DAILY with meals AND AT BEDTIME 40 tablet 3  ? traMADol (ULTRAM) 50 MG tablet Take 1 tablet (50 mg total) by mouth every 8 (eight) hours as needed. 30 tablet 0  ? ?No facility-administered medications prior to visit.  ? ?Past Medical History:  ?Diagnosis Date  ? Cardiomyopathy ([place])   ? Hypertension   ? Lupus ([place])   ? PUD (peptic ulcer disease)   ? Stroke [facility]) 08/13/2018  ? Vertigo   ? ?Past Surgical History:  ?Procedure Laterality Date  ? BACK SURGERY    ? CARPAL TUNNEL RELEASE    ? KNEE CARTILAGE SURGERY    ? NECK SURGERY    ? 2015  ? ?Social History  ? ?Socioeconomic History  ? Marital status: Single  ?  Spouse name: Not on file  ? Number of children: 1  ? Years of education: 104  ? Highest education level: Not on file  ?Occupational History  ? Occupation: retired  ?  Comment: retired  ?Tobacco Use  ? Smoking status: Former  ?  Types: Cigarettes  ?  Quit date: 07/21/2004  ?  Years since quitting: 17.2  ? Smokeless tobacco: Never  ?  Vaping Use  ? Vaping Use: Never used  ?Substance and Sexual Activity  ? Alcohol use: Yes  ?  Comment: occasional  ? Drug use: No  ? Sexual activity: Not on file  ?Other Topics Concern  ? Not on file  ?Social History Narrative  ? Lives one level with son/grandchildren; Left handed; high school grad; retired; walks for exercise; no caffeine  ? ?Social Determinants of Health  ? ?Financial Resource Strain: Low Risk   ? Difficulty of Paying Living Expenses: Not hard at all  ?Food Insecurity: No Food Insecurity  ? Worried About Charity fundraiser in the Last Year: Never true  ? Ran Out of Food in the Last Year: Never true  ?Transportation Needs: No Transportation Needs  ? Lack of Transportation (Medical): No  ? Lack of Transportation  (Non-Medical): No  ?Physical Activity: Insufficiently Active  ? Days of Exercise per Week: 2 days  ? Minutes of Exercise per Session: 10 min  ?Stress: No Stress Concern Present  ? Feeling of Stress : Not at all  ?Social Connections: Moderately Isolated  ? Frequency of Communication with Friends and Family: More than three times a week  ? Frequency of Social Gatherings with Friends and Family: More than three times a week  ? Attends Religious Services: More than 4 times per year  ? Active Member of Clubs or Organizations: No  ? Attends Archivist Meetings: Never  ? Marital Status: Never married  ? ?Family History  ?Problem Relation Age of Onset  ? Hypertension Mother   ? Arthritis Mother   ? Heart failure Mother   ? Stroke Mother   ? Kidney disease Father   ? Hypertension Father   ? ?   ? ?Review of Systems: Pertinent positive and negative review of systems were noted in the above HPI section. All other review of systems were otherwise negative ? ? ?Physical Exam: ?General: Well developed, well nourished, no acute distress ?Head: Normocephalic and atraumatic ?Eyes: Sclerae anicteric, EOMI ?Ears: Normal auditory acuity ?Mouth: Not examined, mask on during Covid-19 pandemic ?Neck: Supple, no masses or thyromegaly ?Lungs: Clear throughout to auscultation ?Heart: Regular rate and rhythm; no murmurs, rubs or bruits ?Abdomen: Soft, non tender and non distended. No masses, hepatosplenomegaly or hernias noted. Normal Bowel sounds ?Rectal: Deferred to colonoscopy  ?Musculoskeletal: Symmetrical with no gross deformities  ?Skin: No lesions on visible extremities ?Pulses:  Normal pulses noted ?Extremities: No clubbing, cyanosis, edema or deformities noted ?Neurological: Alert oriented x 4, grossly nonfocal ?Cervical Nodes:  No significant cervical adenopathy ?Inguinal Nodes: No significant inguinal adenopathy ?Psychological:  Alert and cooperative. Normal mood and affect ? ? ?Assessment and Recommendations: ? ?CRC  screening, average risk.  She is overdue for screening colonoscopy.  Schedule colonoscopy. The risks (including bleeding, perforation, infection, missed lesions, medication reactions and possible hospitalization or surgery if complications occur), benefits, and alternatives to colonoscopy with possible biopsy and possible polypectomy were discussed with the patient and they consent to proceed.   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 Hold Eliquis 2 days before procedure - will instruct when and how to resume after procedure. Low but real risk of cardiovascular event such as heart attack, stroke, embolism, thrombosis or ischemia/infarct of other organs off Eliquis explained and need to seek urgent help if this occurs. The patient consents to proceed. Will communicate by phone or EMR with patient's prescribing provider to confirm that holding Eliquis is reasonable in this case.   ? ? ?  cc: Copland, Gay Filler, MD ?[facility] ?[address] ?[place],  [place] [address] ?

## 2021-10-16 NOTE — Telephone Encounter (Signed)
Per Dr. Stanford Breed, okay to hold Eliquis for 2 days prior to the procedure.  She will need to restart the medication as soon as possible afterward at GI doctor's discretion.  Please inform the patient how long to hold Eliquis.  I was unable to reach her. ?

## 2021-10-16 NOTE — Telephone Encounter (Signed)
Patient with diagnosis of CVA on Eliquis for anticoagulation.   ? ?Procedure: colonoscopy ?Date of procedure: 11/25/21 ? ?CrCl 59 ?Platelet count 240 ? 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 ?Patient undergoing screening colonoscopy.  Could probably do 1 day hold but GI asking for 2 day.  Will defer to cardiologist for clearance and whether a 2 day hold should be bridged.   ? ? ? ? ? ?

## 2021-10-17 DIAGNOSIS — M329 Systemic lupus erythematosus, unspecified: Secondary | ICD-10-CM | POA: Diagnosis not present

## 2021-10-17 DIAGNOSIS — R5383 Other fatigue: Secondary | ICD-10-CM | POA: Diagnosis not present

## 2021-10-17 NOTE — Telephone Encounter (Signed)
Patient informed per Cardiology that she can hold Eliquis 2 days before her procedure. Patient verbalized understanding. ?

## 2021-10-31 ENCOUNTER — Emergency Department (HOSPITAL_BASED_OUTPATIENT_CLINIC_OR_DEPARTMENT_OTHER)
Admission: EM | Admit: 2021-10-31 | Discharge: 2021-10-31 | Disposition: A | Discharge status: Home / Self Care | Payer: Medicare Other | Attending: Emergency Medicine | Admitting: Emergency Medicine

## 2021-10-31 ENCOUNTER — Encounter (HOSPITAL_BASED_OUTPATIENT_CLINIC_OR_DEPARTMENT_OTHER): Payer: Self-pay | Admitting: Urology

## 2021-10-31 ENCOUNTER — Other Ambulatory Visit: Payer: Self-pay

## 2021-10-31 DIAGNOSIS — Z7901 Long term (current) use of anticoagulants: Secondary | ICD-10-CM | POA: Diagnosis not present

## 2021-10-31 DIAGNOSIS — Z20822 Contact with and (suspected) exposure to covid-19: Secondary | ICD-10-CM | POA: Insufficient documentation

## 2021-10-31 DIAGNOSIS — J029 Acute pharyngitis, unspecified: Secondary | ICD-10-CM | POA: Diagnosis not present

## 2021-10-31 LAB — RESP PANEL BY RT-PCR (FLU A&B, COVID) ARPGX2
Influenza A by PCR: NEGATIVE
Influenza B by PCR: NEGATIVE
SARS Coronavirus 2 by RT PCR: NEGATIVE

## 2021-10-31 LAB — GROUP A STREP BY PCR: Group A Strep by PCR: NOT DETECTED

## 2021-10-31 MED ORDER — DEXAMETHASONE 4 MG PO TABS
10.0000 mg | ORAL_TABLET | Freq: Once | ORAL | Status: AC
  Administered 2021-10-31: 10 mg via ORAL
  Filled 2021-10-31: qty 3

## 2021-10-31 NOTE — ED Provider Notes (Signed)
?[facility] EMERGENCY DEPARTMENT ?Provider Note ? ? ?CSN: [id number] ?Arrival date & time: 10/31/21  2043 ? ?  ? ?History ? ?Chief Complaint  ?Patient presents with  ? Sore Throat  ? ? ?Amanda Saunders is a 66 y.o. female. ? ?The history is provided by the patient.  ?Sore Throat ?This is a new problem. Episode onset: 1 day. The problem occurs constantly. The problem has not changed since onset.Pertinent negatives include no chest pain, no abdominal pain, no headaches and no shortness of breath. Nothing aggravates the symptoms. Nothing relieves the symptoms. She has tried nothing for the symptoms. The treatment provided no relief.  ? ?  ? ?Home Medications ?Prior to Admission medications   ?Medication Sig Start Date End Date Taking? Authorizing Provider  ?diclofenac Sodium (VOLTAREN) 1 % GEL Apply 4 g topically 4 (four) times daily as needed. 03/08/21   Hilts, Legrand Como, MD  ?Arne Cleveland 5 MG TABS tablet TAKE ONE TABLET BY MOUTH EVERY MORNING and TAKE ONE TABLET BY MOUTH AT BEDTIME 08/19/21   Lelon Perla, MD  ?gabapentin (NEURONTIN) 800 MG tablet Take 1 tablet (800 mg total) by mouth 2 (two) times daily. 10/15/21   Pieter Partridge, DO  ?hydrochlorothiazide (HYDRODIURIL) 25 MG tablet Take 1 tablet (25 mg total) by mouth daily. 11/23/20   Sande Rives E, PA-C  ?hydroxychloroquine (PLAQUENIL) 200 MG tablet Take 1 tablet (200 mg total) by mouth 2 (two) times daily. 06/26/21   Copland, Gay Filler, MD  ?losartan (COZAAR) 50 MG tablet TAKE ONE TABLET BY MOUTH EVERY MORNING 01/28/21   Copland, Gay Filler, MD  ?meclizine (ANTIVERT) 25 MG tablet TAKE ONE TABLET BY MOUTH twice daily AS NEEDED FOR dizziness 10/10/20   Copland, Gay Filler, MD  ?metFORMIN (GLUCOPHAGE) 500 MG tablet Take 0.5-1 tablets (250-500 mg total) by mouth at bedtime. 01/28/21   Copland, Gay Filler, MD  ?metoprolol succinate (TOPROL-XL) 25 MG 24 hr tablet TAKE ONE TABLET BY MOUTH EVERY MORNING 01/28/21   Copland, Gay Filler, MD  ?omeprazole (PRILOSEC) 40 MG  capsule TAKE ONE CAPSULE BY MOUTH ONCE DAILY 08/19/21   Copland, Gay Filler, MD  ?ondansetron (ZOFRAN ODT) 8 MG disintegrating tablet Take 1 tablet (8 mg total) by mouth every 8 (eight) hours as needed for nausea or vomiting. 06/01/21   Molpus, John, MD  ?rosuvastatin (CRESTOR) 20 MG tablet TAKE ONE TABLET BY MOUTH EVERYDAY AT BEDTIME 08/22/21   Copland, Gay Filler, MD  ?sucralfate (CARAFATE) 1 g tablet TAKE ONE TABLET BY MOUTH FOUR TIMES DAILY with meals AND AT BEDTIME 03/28/21   Copland, Gay Filler, MD  ?traMADol (ULTRAM) 50 MG tablet Take 1 tablet (50 mg total) by mouth every 8 (eight) hours as needed. 04/12/21   Copland, Gay Filler, MD  ?   ? ?Allergies    ?Codeine   ? ?Review of Systems   ?Review of Systems  ?Respiratory:  Negative for shortness of breath.   ?Cardiovascular:  Negative for chest pain.  ?Gastrointestinal:  Negative for abdominal pain.  ?Neurological:  Negative for headaches.  ? ?Physical Exam ?Updated Vital Signs ?BP 136/60 (BP Location: Right Arm)   Pulse 66   Temp 98.2 ?F (36.8 ?C) (Oral)   Resp 18   Ht '5\' 3"'$  (1.6 m)   Wt 90.3 kg   SpO2 95%   BMI 35.25 kg/m?  ?Physical Exam ?Vitals and nursing note reviewed.  ?Constitutional:   ?   General: She is not in acute distress. ?   Appearance: She  is well-developed. She is not ill-appearing.  ?HENT:  ?   Head: Normocephalic and atraumatic.  ?   Right Ear: Tympanic membrane normal.  ?   Left Ear: Tympanic membrane normal.  ?   Mouth/Throat:  ?   Mouth: Mucous membranes are moist.  ?   Pharynx: Posterior oropharyngeal erythema present. No oropharyngeal exudate.  ?   Tonsils: No tonsillar exudate.  ?Eyes:  ?   Conjunctiva/sclera: Conjunctivae normal.  ?Cardiovascular:  ?   Rate and Rhythm: Normal rate and regular rhythm.  ?   Heart sounds: Normal heart sounds. No murmur heard. ?Pulmonary:  ?   Effort: Pulmonary effort is normal. No respiratory distress.  ?   Breath sounds: Normal breath sounds.  ?Abdominal:  ?   Palpations: Abdomen is soft.  ?   Tenderness:  There is no abdominal tenderness.  ?Musculoskeletal:     ?   General: No swelling.  ?   Cervical back: Neck supple.  ?Skin: ?   General: Skin is warm and dry.  ?   Capillary Refill: Capillary refill takes less than 2 seconds.  ?Neurological:  ?   Mental Status: She is alert.  ?Psychiatric:     ?   Mood and Affect: Mood normal.  ? ? ?ED Results / Procedures / Treatments   ?Labs ?(all labs ordered are listed, but only abnormal results are displayed) ?Labs Reviewed  ?GROUP A STREP BY PCR  ?RESP PANEL BY RT-PCR (FLU A&B, COVID) [id number]  ? ? ?EKG ?None ? ?Radiology ?No results found. ? ?Procedures ?Procedures  ? ? ?Medications Ordered in ED ?Medications  ?dexamethasone (DECADRON) tablet 10 mg (has no administration in time range)  ? ? ?ED Course/ Medical Decision Making/ A&P ?  ?                        ?Medical Decision Making ?Risk ?Prescription drug management. ? ? ?[name] is here with sore throat.  Normal vitals.  No fever.  Strep test negative.  Viral panel pending.  Overall suspect allergy versus viral process.  Will treat with Decadron.  No concern for deep infectious process.  No submandibular swelling, no trismus, no drooling.  Discharged in good condition. ? ?This chart was dictated using voice recognition software.  Despite best efforts to proofread,  errors can occur which can change the documentation meaning.  ? ? ? ? ? ? ? ?Final Clinical Impression(s) / ED Diagnoses ?Final diagnoses:  ?Sore throat  ? ? ?Rx / DC Orders ?ED Discharge Orders   ? ? None  ? ?  ? ? ?  ?Lennice Sites, DO ?10/31/21 2133 ? ?

## 2021-10-31 NOTE — Discharge Instructions (Signed)
Strep test is negative.  Follow-up your viral panel on your MyChart.  Overall suspect this is allergy related.  You have been treated with a long-acting steroid. ?

## 2021-10-31 NOTE — ED Triage Notes (Addendum)
Reports productive cough with yellow sputum x 3 days. Also sts sore throat and difficulty swallowing. Denies fevers.  ? ?Also admits to bilateral ear pain and lack of balance x 3 days. Ambulatory with no distress.  ?

## 2021-10-31 NOTE — ED Notes (Signed)
Discharge paperwork given and understood. 

## 2021-11-04 ENCOUNTER — Encounter: Payer: Self-pay | Admitting: Family Medicine

## 2021-11-05 MED ORDER — AMOXICILLIN 500 MG PO CAPS: 1000.0000 mg | ORAL_CAPSULE | Freq: Two times a day (BID) | ORAL | 0 refills | Status: DC

## 2021-11-10 ENCOUNTER — Other Ambulatory Visit: Payer: Self-pay | Admitting: Family Medicine

## 2021-11-10 DIAGNOSIS — E119 Type 2 diabetes mellitus without complications: Secondary | ICD-10-CM

## 2021-11-18 ENCOUNTER — Ambulatory Visit: Payer: Medicare Other | Admitting: Neurology

## 2021-11-18 DIAGNOSIS — M329 Systemic lupus erythematosus, unspecified: Secondary | ICD-10-CM | POA: Diagnosis not present

## 2021-11-20 ENCOUNTER — Emergency Department (HOSPITAL_COMMUNITY): Payer: Medicare Other

## 2021-11-20 ENCOUNTER — Emergency Department (HOSPITAL_COMMUNITY)
Admission: EM | Admit: 2021-11-20 | Discharge: 2021-11-20 | Disposition: A | Discharge status: Home / Self Care | Payer: Medicare Other | Attending: Emergency Medicine | Admitting: Emergency Medicine

## 2021-11-20 ENCOUNTER — Encounter (HOSPITAL_COMMUNITY): Payer: Self-pay | Admitting: Radiology

## 2021-11-20 DIAGNOSIS — M545 Low back pain, unspecified: Secondary | ICD-10-CM | POA: Diagnosis not present

## 2021-11-20 DIAGNOSIS — S3992XA Unspecified injury of lower back, initial encounter: Secondary | ICD-10-CM | POA: Diagnosis not present

## 2021-11-20 DIAGNOSIS — G4489 Other headache syndrome: Secondary | ICD-10-CM | POA: Diagnosis not present

## 2021-11-20 DIAGNOSIS — Y92410 Unspecified street and highway as the place of occurrence of the external cause: Secondary | ICD-10-CM | POA: Insufficient documentation

## 2021-11-20 DIAGNOSIS — R519 Headache, unspecified: Secondary | ICD-10-CM | POA: Diagnosis not present

## 2021-11-20 DIAGNOSIS — Z7901 Long term (current) use of anticoagulants: Secondary | ICD-10-CM | POA: Insufficient documentation

## 2021-11-20 DIAGNOSIS — M47817 Spondylosis without myelopathy or radiculopathy, lumbosacral region: Secondary | ICD-10-CM | POA: Diagnosis not present

## 2021-11-20 DIAGNOSIS — M549 Dorsalgia, unspecified: Secondary | ICD-10-CM | POA: Diagnosis not present

## 2021-11-20 DIAGNOSIS — S0990XA Unspecified injury of head, initial encounter: Secondary | ICD-10-CM | POA: Diagnosis not present

## 2021-11-20 DIAGNOSIS — M47816 Spondylosis without myelopathy or radiculopathy, lumbar region: Secondary | ICD-10-CM | POA: Diagnosis not present

## 2021-11-20 DIAGNOSIS — R040 Epistaxis: Secondary | ICD-10-CM | POA: Diagnosis not present

## 2021-11-20 MED ORDER — TRAMADOL HCL 50 MG PO TABS
50.0000 mg | ORAL_TABLET | Freq: Four times a day (QID) | ORAL | 0 refills | Status: DC | PRN
Start: 2021-11-20 — End: 2021-11-25

## 2021-11-20 MED ORDER — OXYCODONE-ACETAMINOPHEN 5-325 MG PO TABS
1.0000 | ORAL_TABLET | Freq: Once | ORAL | Status: AC
  Administered 2021-11-20: 1 via ORAL
  Filled 2021-11-20: qty 1
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
## 2021-11-20 NOTE — Discharge Instructions (Signed)
The CAT scan of your brain shows an old stroke in the right frontal part of your brain.  Follow-up with your primary care doctor for this ?

## 2021-11-20 NOTE — ED Notes (Signed)
Patient Alert and oriented to baseline. Stable and ambulatory to baseline. Patient verbalized understanding of the discharge instructions.  Patient belongings were taken by the patient.   
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
## 2021-11-22 ENCOUNTER — Emergency Department (HOSPITAL_COMMUNITY)
Admission: EM | Admit: 2021-11-22 | Discharge: 2021-11-22 | Disposition: A | Discharge status: Home / Self Care | Payer: Medicare Other | Attending: Emergency Medicine | Admitting: Emergency Medicine

## 2021-11-22 ENCOUNTER — Other Ambulatory Visit: Payer: Self-pay

## 2021-11-22 ENCOUNTER — Encounter (HOSPITAL_COMMUNITY): Payer: Self-pay | Admitting: Emergency Medicine

## 2021-11-22 DIAGNOSIS — Z7901 Long term (current) use of anticoagulants: Secondary | ICD-10-CM | POA: Insufficient documentation

## 2021-11-22 DIAGNOSIS — R519 Headache, unspecified: Secondary | ICD-10-CM | POA: Insufficient documentation

## 2021-11-22 DIAGNOSIS — I10 Essential (primary) hypertension: Secondary | ICD-10-CM | POA: Diagnosis not present

## 2021-11-22 DIAGNOSIS — Z79899 Other long term (current) drug therapy: Secondary | ICD-10-CM | POA: Insufficient documentation

## 2021-11-22 DIAGNOSIS — R42 Dizziness and giddiness: Secondary | ICD-10-CM | POA: Diagnosis not present

## 2021-11-22 DIAGNOSIS — R040 Epistaxis: Secondary | ICD-10-CM | POA: Insufficient documentation

## 2021-11-22 DIAGNOSIS — R1111 Vomiting without nausea: Secondary | ICD-10-CM | POA: Diagnosis not present

## 2021-11-22 DIAGNOSIS — R58 Hemorrhage, not elsewhere classified: Secondary | ICD-10-CM | POA: Diagnosis not present

## 2021-11-22 DIAGNOSIS — G4489 Other headache syndrome: Secondary | ICD-10-CM | POA: Diagnosis not present

## 2021-11-22 LAB — CBC
HCT: 36.3 % (ref 36.0–46.0)
Hemoglobin: 12.3 g/dL (ref 12.0–15.0)
MCH: 29.4 pg (ref 26.0–34.0)
MCHC: 33.9 g/dL (ref 30.0–36.0)
MCV: 86.6 fL (ref 80.0–100.0)
Platelets: 240 10*3/uL (ref 150–400)
RBC: 4.19 MIL/uL (ref 3.87–5.11)
RDW: 13.9 % (ref 11.5–15.5)
WBC: 6 10*3/uL (ref 4.0–10.5)
nRBC: 0 % (ref 0.0–0.2)

## 2021-11-22 LAB — BASIC METABOLIC PANEL
Anion gap: 8 (ref 5–15)
BUN: 21 mg/dL (ref 8–23)
CO2: 28 mmol/L (ref 22–32)
Calcium: 9 mg/dL (ref 8.9–10.3)
Chloride: 106 mmol/L (ref 98–111)
Creatinine, Ser: 1.22 mg/dL — ABNORMAL HIGH (ref 0.44–1.00)
GFR, Estimated: 50 mL/min — ABNORMAL LOW (ref >60)
Glucose, Bld: 115 mg/dL — ABNORMAL HIGH (ref 70–99)
Potassium: 3.6 mmol/L (ref 3.5–5.1)
Sodium: 142 mmol/L (ref 135–145)

## 2021-11-22 LAB — I-STAT CHEM 8, ED
BUN: 19 mg/dL (ref 8–23)
Calcium, Ion: 1.2 mmol/L (ref 1.15–1.40)
Chloride: 102 mmol/L (ref 98–111)
Creatinine, Ser: 1.2 mg/dL — ABNORMAL HIGH (ref 0.44–1.00)
Glucose, Bld: 113 mg/dL — ABNORMAL HIGH (ref 70–99)
HCT: 37 % (ref 36.0–46.0)
Hemoglobin: 12.6 g/dL (ref 12.0–15.0)
Potassium: 3.6 mmol/L (ref 3.5–5.1)
Sodium: 143 mmol/L (ref 135–145)
TCO2: 29 mmol/L (ref 22–32)

## 2021-11-22 LAB — CBG MONITORING, ED: Glucose-Capillary: 126 mg/dL — ABNORMAL HIGH (ref 70–99)

## 2021-11-22 MED ORDER — OXYMETAZOLINE HCL 0.05 % NA SOLN
1.0000 | Freq: Once | NASAL | Status: AC
  Administered 2021-11-22: 1 via NASAL
  Filled 2021-11-22: qty 30

## 2021-11-22 MED ORDER — ACETAMINOPHEN 325 MG PO TABS
650.0000 mg | ORAL_TABLET | Freq: Once | ORAL | Status: AC
  Administered 2021-11-22: 650 mg via ORAL
  Filled 2021-11-22: qty 2

## 2021-11-22 MED ORDER — ONDANSETRON HCL 4 MG/2ML IJ SOLN
4.0000 mg | Freq: Once | INTRAMUSCULAR | Status: AC
  Administered 2021-11-22: 4 mg via INTRAVENOUS
  Filled 2021-11-22: qty 2

## 2021-11-22 NOTE — Discharge Instructions (Signed)
If you start bleeding again, you should spray the Afrin in the nose and hold your nose closed.  If after 15 minutes you are still bleeding then come back to the emergency department. ?
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
## 2021-11-22 NOTE — ED Provider Notes (Signed)
?[facility] DEPT ?Provider Note ? ? ?CSN: [id number] ?Arrival date & time: 11/22/21  1729 ? ?  ? ?History ? ?Chief Complaint  ?Patient presents with  ? Epistaxis  ? Hematemesis  ? Dizziness  ? Headache  ? ? ?Amanda Saunders is a 66 y.o. female. ? ?Patient has a history of cardiomyopathy and hypertension.  She was on Eliquis but stopped it Wednesday because she gets a colonoscopy soon.  She is complaining that she had nosebleed today but it stopped ? ?The history is provided by the patient and medical records. No language interpreter was used.  ?Epistaxis ?Location:  Bilateral ?Severity:  Mild ?Timing:  Intermittent ?Progression:  Resolved ?Chronicity:  New ?Context: anticoagulants   ?Relieved by:  Nothing ?Worsened by:  Nothing ?Associated symptoms: dizziness and headaches   ?Associated symptoms: no blood in oropharynx, no congestion and no cough   ?Dizziness ?Associated symptoms: headaches   ?Associated symptoms: no chest pain and no diarrhea   ?Headache ?Associated symptoms: dizziness   ?Associated symptoms: no abdominal pain, no back pain, no congestion, no cough, no diarrhea, no fatigue, no seizures and no sinus pressure   ? ?  ? ?Home Medications ?Prior to Admission medications   ?Medication Sig Start Date End Date Taking? Authorizing Provider  ?amoxicillin (AMOXIL) 500 MG capsule Take 2 capsules (1,000 mg total) by mouth 2 (two) times daily. 11/05/21   Copland, Gay Filler, MD  ?diclofenac Sodium (VOLTAREN) 1 % GEL Apply 4 g topically 4 (four) times daily as needed. 03/08/21   Hilts, Legrand Como, MD  ?Arne [place] 5 MG TABS tablet TAKE ONE TABLET BY MOUTH EVERY MORNING and TAKE ONE TABLET BY MOUTH AT BEDTIME 08/19/21   Lelon Perla, MD  ?gabapentin (NEURONTIN) 800 MG tablet Take 1 tablet (800 mg total) by mouth 2 (two) times daily. 10/15/21   Pieter Partridge, DO  ?hydrochlorothiazide (HYDRODIURIL) 25 MG tablet Take 1 tablet (25 mg total) by mouth daily. 11/23/20   Sande Rives E, PA-C   ?hydroxychloroquine (PLAQUENIL) 200 MG tablet Take 1 tablet (200 mg total) by mouth 2 (two) times daily. 06/26/21   Copland, Gay Filler, MD  ?losartan (COZAAR) 50 MG tablet TAKE ONE TABLET BY MOUTH EVERY MORNING 01/28/21   Copland, Gay Filler, MD  ?meclizine (ANTIVERT) 25 MG tablet TAKE ONE TABLET BY MOUTH twice daily AS NEEDED FOR dizziness 10/10/20   Copland, Gay Filler, MD  ?metFORMIN (GLUCOPHAGE) 500 MG tablet TAKE ONE TABLET BY MOUTH EVERYDAY AT BEDTIME 11/11/21   Copland, Gay Filler, MD  ?metoprolol succinate (TOPROL-XL) 25 MG 24 hr tablet TAKE ONE TABLET BY MOUTH EVERY MORNING 01/28/21   Copland, Gay Filler, MD  ?omeprazole (PRILOSEC) 40 MG capsule TAKE ONE CAPSULE BY MOUTH ONCE DAILY 08/19/21   Copland, Gay Filler, MD  ?ondansetron (ZOFRAN ODT) 8 MG disintegrating tablet Take 1 tablet (8 mg total) by mouth every 8 (eight) hours as needed for nausea or vomiting. 06/01/21   Molpus, John, MD  ?rosuvastatin (CRESTOR) 20 MG tablet TAKE ONE TABLET BY MOUTH EVERYDAY AT BEDTIME 08/22/21   Copland, Gay Filler, MD  ?sucralfate (CARAFATE) 1 g tablet TAKE ONE TABLET BY MOUTH FOUR TIMES DAILY with meals AND AT BEDTIME 03/28/21   Copland, Gay Filler, MD  ?traMADol (ULTRAM) 50 MG tablet Take 1 tablet (50 mg total) by mouth every 8 (eight) hours as needed. 04/12/21   Copland, Gay Filler, MD  ?traMADol (ULTRAM) 50 MG tablet Take 1 tablet (50 mg total) by mouth every 6 (  six) hours as needed. 11/20/21   Lacretia Leigh, MD  ?   ? ?Allergies    ?Codeine   ? ?Review of Systems   ?Review of Systems  ?Constitutional:  Negative for appetite change and fatigue.  ?HENT:  Positive for nosebleeds. Negative for congestion, ear discharge and sinus pressure.   ?Eyes:  Negative for discharge.  ?Respiratory:  Negative for cough.   ?Cardiovascular:  Negative for chest pain.  ?Gastrointestinal:  Negative for abdominal pain and diarrhea.  ?Genitourinary:  Negative for frequency and hematuria.  ?Musculoskeletal:  Negative for back pain.  ?Skin:  Negative for rash.   ?Neurological:  Positive for dizziness and headaches. Negative for seizures.  ?Psychiatric/Behavioral:  Negative for hallucinations.   ? ?Physical Exam ?Updated Vital Signs ?BP 106/72   Pulse 63   Temp 98 ?F (36.7 ?C) (Oral)   Resp 15   SpO2 98%  ?Physical Exam ?Vitals and nursing note reviewed.  ?Constitutional:   ?   Appearance: She is well-developed.  ?HENT:  ?   Head: Normocephalic.  ?   Nose:  ?   Comments: Dried blood in nostrils ?Eyes:  ?   General: No scleral icterus. ?   Conjunctiva/sclera: Conjunctivae normal.  ?Neck:  ?   Thyroid: No thyromegaly.  ?Cardiovascular:  ?   Rate and Rhythm: Normal rate and regular rhythm.  ?   Heart sounds: No murmur heard. ?  No friction rub. No gallop.  ?Pulmonary:  ?   Breath sounds: No stridor. No wheezing or rales.  ?Chest:  ?   Chest wall: No tenderness.  ?Abdominal:  ?   General: There is no distension.  ?   Tenderness: There is no abdominal tenderness. There is no rebound.  ?Musculoskeletal:     ?   General: Normal range of motion.  ?   Cervical back: Neck supple.  ?Lymphadenopathy:  ?   Cervical: No cervical adenopathy.  ?Skin: ?   Findings: No erythema or rash.  ?Neurological:  ?   Mental Status: She is alert and oriented to person, place, and time.  ?   Motor: No abnormal muscle tone.  ?   Coordination: Coordination normal.  ?Psychiatric:     ?   Behavior: Behavior normal.  ? ? ?ED Results / Procedures / Treatments   ?Labs ?(all labs ordered are listed, but only abnormal results are displayed) ?Labs Reviewed  ?BASIC METABOLIC PANEL - Abnormal; Notable for the following components:  ?    Result Value  ? Glucose, Bld 115 (*)   ? Creatinine, Ser 1.22 (*)   ? GFR, Estimated 50 (*)   ? All other components within normal limits  ?CBG MONITORING, ED - Abnormal; Notable for the following components:  ? Glucose-Capillary 126 (*)   ? All other components within normal limits  ?I-STAT CHEM 8, ED - Abnormal; Notable for the following components:  ? Creatinine, Ser 1.20 (*)    ? Glucose, Bld 113 (*)   ? All other components within normal limits  ?CBC  ? ? ?EKG ?None ? ?Radiology ?No results found. ? ?Procedures ?Procedures  ? ? ?Medications Ordered in ED ?Medications  ?oxymetazoline (AFRIN) 0.05 % nasal spray 1 spray (has no administration in time range)  ?ondansetron [facility]) injection 4 mg (4 mg Intravenous Given 11/22/21 1758)  ?acetaminophen (TYLENOL) tablet 650 mg (650 mg Oral Given 11/22/21 1820)  ? ? ?ED Course/ Medical Decision Making/ A&P ?  ?                        ?  Medical Decision Making ?Amount and/or Complexity of Data Reviewed ?Labs: ordered. ? ?Risk ?OTC drugs. ?Prescription drug management. ? ?This patient presents to the ED for concern of, this involves an extensive number of treatment options, and is a complaint that carries with it a high risk of complications and morbidity.  The differential diagnosis includes is bleeding from being on Eliquis ? ? ?Co morbidities that complicate the patient evaluation ? ?Code.  Hypertension ? ? ?Additional history obtained: ? ?Additional history obtained from patient ?External records from outside source obtained and reviewed including hospital records ? ? ?Lab Tests: ? ?I Ordered, and personally interpreted labs.  The pertinent results include: Chemistries and hemoglobin which were unremarkable ? ? ?Imaging Studies ordered: ? ?No imaging ? ?Cardiac Monitoring: / EKG: ? ?The patient was maintained on a cardiac monitor.  I personally viewed and interpreted the cardiac monitored which showed an underlying rhythm of: Normal sinus rhythm ? ? ?Consultations Obtained: ?No consult ? ?Problem List / ED Course / Critical interventions / Medication management ? ?Cardiomyopathy and nosebleed ?I ordered medication including Afrin for nosebleed ?Reevaluation of the patient after these medicines showed that the patient improved ?I have reviewed the patients home medicines and have made adjustments as needed ? ? ?Social Determinants of  Health: ? ?None ? ? ?Test / Admission - Considered: ? ?None ? ?Patient with a nosebleed that has resolved.  She is sent home with Afrin and will follow-up as needed.  Hemoglobin normal ? ? ? ? ? ? ? ?Final Clinical Impress

## 2021-11-25 ENCOUNTER — Encounter: Payer: Self-pay | Admitting: Gastroenterology

## 2021-11-25 ENCOUNTER — Ambulatory Visit (AMBULATORY_SURGERY_CENTER): Payer: Medicare Other | Admitting: Gastroenterology

## 2021-11-25 VITALS — BP 124/67 | HR 84 | Temp 97.3°F | Resp 15 | Ht 63.0 in | Wt 195.0 lb

## 2021-11-25 DIAGNOSIS — K635 Polyp of colon: Secondary | ICD-10-CM | POA: Diagnosis not present

## 2021-11-25 DIAGNOSIS — Z1212 Encounter for screening for malignant neoplasm of rectum: Secondary | ICD-10-CM

## 2021-11-25 DIAGNOSIS — Z1211 Encounter for screening for malignant neoplasm of colon: Secondary | ICD-10-CM | POA: Diagnosis not present

## 2021-11-25 DIAGNOSIS — D125 Benign neoplasm of sigmoid colon: Secondary | ICD-10-CM

## 2021-11-25 MED ORDER — SODIUM CHLORIDE 0.9 % IV SOLN: 500.0000 mL | Freq: Once | INTRAVENOUS | Status: DC

## 2021-11-25 NOTE — Progress Notes (Signed)
VS by CW  Pt's states no medical or surgical changes since previsit or office visit.  

## 2021-11-25 NOTE — Patient Instructions (Signed)
Handout on polyps hemorrhoids and diverticulosis provided  ? ?Await pathology results.  ? ?Continue current medications.  ? ?Resume Eliquis (apixaban) in 2 days at prior dose. Refer to managing physician for further adjustment of therapy. ? ? No aspirin, ibuprofen, naproxen, or other non-steroidal anti-inflammatory drugs for 2 weeks after polyp removal. ? ?YOU HAD AN ENDOSCOPIC PROCEDURE TODAY AT [facility]:   Refer to the procedure report that was given to you for any specific questions about what was found during the examination.  If the procedure report does not answer your questions, please call your gastroenterologist to clarify.  If you requested that your care partner not be given the details of your procedure findings, then the procedure report has been included in a sealed envelope for you to review at your convenience later. ? ?YOU SHOULD EXPECT: Some feelings of bloating in the abdomen. Passage of more gas than usual.  Walking can help get rid of the air that was put into your GI tract during the procedure and reduce the bloating. If you had a lower endoscopy (such as a colonoscopy or flexible sigmoidoscopy) you may notice spotting of blood in your stool or on the toilet paper. If you underwent a bowel prep for your procedure, you may not have a normal bowel movement for a few days. ? ?Please Note:  You might notice some irritation and congestion in your nose or some drainage.  This is from the oxygen used during your procedure.  There is no need for concern and it should clear up in a day or so. ? ?SYMPTOMS TO REPORT IMMEDIATELY: ? ?Following lower endoscopy (colonoscopy or flexible sigmoidoscopy): ? Excessive amounts of blood in the stool ? Significant tenderness or worsening of abdominal pains ? Swelling of the abdomen that is new, acute ? Fever of 100?F or higher ? ? ?For urgent or emergent issues, a gastroenterologist can be reached at any hour by calling [phone]. ?Do not use  MyChart messaging for urgent concerns.  ? ? ?DIET:  We do recommend a small meal at first, but then you may proceed to your regular diet.  Drink plenty of fluids but you should avoid alcoholic beverages for 24 hours. ? ?ACTIVITY:  You should plan to take it easy for the rest of today and you should NOT DRIVE or use heavy machinery until tomorrow (because of the sedation medicines used during the test).   ? ?FOLLOW UP: ?Our staff will call the number listed on your records 48-72 hours following your procedure to check on you and address any questions or concerns that you may have regarding the information given to you following your procedure. If we do not reach you, we will leave a message.  We will attempt to reach you two times.  During this call, we will ask if you have developed any symptoms of COVID 19. If you develop any symptoms (ie: fever, flu-like symptoms, shortness of breath, cough etc.) before then, please call [phone].  If you test positive for Covid 19 in the 2 weeks post procedure, please call and report this information to [place].   ? ?If any biopsies were taken you will be contacted by phone or by letter within the next 1-3 weeks.  Please call us at [phone] if you have not heard about the biopsies in 3 weeks.  ? ? ?SIGNATURES/CONFIDENTIALITY: ?You and/or your care partner have signed paperwork which will be entered into your electronic medical record.  These signatures  attest to the fact that that the information above on your After Visit Summary has been reviewed and is understood.  Full responsibility of the confidentiality of this discharge information lies with you and/or your care-partner. ? ? ?

## 2021-11-25 NOTE — Progress Notes (Signed)
? ?History & Physical ? ?Primary Care Physician:  Darreld Mclean, MD ?Primary Gastroenterologist: Lucio Edward, MD ? ?CHIEF COMPLAINT:  CRC screening ? ?HPI: Amanda Saunders is a 66 y.o. female for CRC screening, average risk, with colonoscopy.  Eliquis held for 2 days prior to procedure. ? ? ?Past Medical History:  ?Diagnosis Date  ? Cardiomyopathy ([place])   ? Hypertension   ? Lupus ([place])   ? PUD (peptic ulcer disease)   ? Stroke [facility]) 08/13/2018  ? Vertigo   ? ? ?Past Surgical History:  ?Procedure Laterality Date  ? BACK SURGERY    ? CARPAL TUNNEL RELEASE    ? COLONOSCOPY    ? KNEE CARTILAGE SURGERY    ? NECK SURGERY    ? 2015  ? UPPER GASTROINTESTINAL ENDOSCOPY    ? ? ?Prior to Admission medications   ?Medication Sig Start Date End Date Taking? Authorizing Provider  ?ELIQUIS 5 MG TABS tablet TAKE ONE TABLET BY MOUTH EVERY MORNING and TAKE ONE TABLET BY MOUTH AT BEDTIME 08/19/21  Yes Lelon Perla, MD  ?gabapentin (NEURONTIN) 800 MG tablet Take 1 tablet (800 mg total) by mouth 2 (two) times daily. 10/15/21  Yes Tomi Likens, Adam R, DO  ?hydrochlorothiazide (HYDRODIURIL) 25 MG tablet Take 1 tablet (25 mg total) by mouth daily. 11/23/20  Yes Sande Rives E, PA-C  ?hydroxychloroquine (PLAQUENIL) 200 MG tablet Take 1 tablet (200 mg total) by mouth 2 (two) times daily. 06/26/21  Yes Copland, Gay Filler, MD  ?losartan (COZAAR) 50 MG tablet TAKE ONE TABLET BY MOUTH EVERY MORNING 01/28/21  Yes Copland, Gay Filler, MD  ?metFORMIN (GLUCOPHAGE) 500 MG tablet TAKE ONE TABLET BY MOUTH EVERYDAY AT BEDTIME 11/11/21  Yes Copland, Gay Filler, MD  ?metoprolol succinate (TOPROL-XL) 25 MG 24 hr tablet TAKE ONE TABLET BY MOUTH EVERY MORNING 01/28/21  Yes Copland, Gay Filler, MD  ?omeprazole (PRILOSEC) 40 MG capsule TAKE ONE CAPSULE BY MOUTH ONCE DAILY 08/19/21  Yes Copland, Gay Filler, MD  ?rosuvastatin (CRESTOR) 20 MG tablet TAKE ONE TABLET BY MOUTH EVERYDAY AT BEDTIME 08/22/21  Yes Copland, Gay Filler, MD  ?sucralfate (CARAFATE) 1 g tablet  TAKE ONE TABLET BY MOUTH FOUR TIMES DAILY with meals AND AT BEDTIME 03/28/21  Yes Copland, Gay Filler, MD  ?atorvastatin (LIPITOR) 10 MG tablet (Prior Auth: Rx HUT#:[id number]) ?Patient not taking: Reported on 11/25/2021    [provider]  ?belimumab (BENLYSTA) 400 MG SOLR injection '10mg'$ /kg    [provider]  ?diclofenac Sodium (VOLTAREN) 1 % GEL Apply 4 g topically 4 (four) times daily as needed. 03/08/21   Hilts, Legrand Como, MD  ?meclizine (ANTIVERT) 25 MG tablet TAKE ONE TABLET BY MOUTH twice daily AS NEEDED FOR dizziness 10/10/20   Copland, Gay Filler, MD  ?ondansetron (ZOFRAN ODT) 8 MG disintegrating tablet Take 1 tablet (8 mg total) by mouth every 8 (eight) hours as needed for nausea or vomiting. 06/01/21   Molpus, John, MD  ?traMADol (ULTRAM) 50 MG tablet Take 1 tablet (50 mg total) by mouth every 8 (eight) hours as needed. 04/12/21   Copland, Gay Filler, MD  ? ? ?Current Outpatient Medications  ?Medication Sig Dispense Refill  ? ELIQUIS 5 MG TABS tablet TAKE ONE TABLET BY MOUTH EVERY MORNING and TAKE ONE TABLET BY MOUTH AT BEDTIME 180 tablet 1  ? gabapentin (NEURONTIN) 800 MG tablet Take 1 tablet (800 mg total) by mouth 2 (two) times daily. 60 tablet 5  ? hydrochlorothiazide (HYDRODIURIL) 25 MG tablet Take 1 tablet (25  mg total) by mouth daily. 90 tablet 3  ? hydroxychloroquine (PLAQUENIL) 200 MG tablet Take 1 tablet (200 mg total) by mouth 2 (two) times daily. 30 tablet 0  ? losartan (COZAAR) 50 MG tablet TAKE ONE TABLET BY MOUTH EVERY MORNING 90 tablet 3  ? metFORMIN (GLUCOPHAGE) 500 MG tablet TAKE ONE TABLET BY MOUTH EVERYDAY AT BEDTIME 90 tablet 3  ? metoprolol succinate (TOPROL-XL) 25 MG 24 hr tablet TAKE ONE TABLET BY MOUTH EVERY MORNING 90 tablet 3  ? omeprazole (PRILOSEC) 40 MG capsule TAKE ONE CAPSULE BY MOUTH ONCE DAILY 90 capsule 1  ? rosuvastatin (CRESTOR) 20 MG tablet TAKE ONE TABLET BY MOUTH EVERYDAY AT BEDTIME 90 tablet 3  ? sucralfate (CARAFATE) 1 g tablet TAKE ONE TABLET BY MOUTH FOUR  TIMES DAILY with meals AND AT BEDTIME 40 tablet 3  ? atorvastatin (LIPITOR) 10 MG tablet (Prior Auth: Rx [id number]) (Patient not taking: Reported on 11/25/2021)    ? belimumab (BENLYSTA) 400 MG SOLR injection '10mg'$ /kg    ? diclofenac Sodium (VOLTAREN) 1 % GEL Apply 4 g topically 4 (four) times daily as needed. 500 g 6  ? meclizine (ANTIVERT) 25 MG tablet TAKE ONE TABLET BY MOUTH twice daily AS NEEDED FOR dizziness 90 tablet 1  ? ondansetron (ZOFRAN ODT) 8 MG disintegrating tablet Take 1 tablet (8 mg total) by mouth every 8 (eight) hours as needed for nausea or vomiting. 10 tablet 0  ? traMADol (ULTRAM) 50 MG tablet Take 1 tablet (50 mg total) by mouth every 8 (eight) hours as needed. 30 tablet 0  ? ?Current Facility-Administered Medications  ?Medication Dose Route Frequency Provider Last Rate Last Admin  ? 0.9 %  sodium chloride infusion  500 mL Intravenous Once Ladene Artist, MD      ? ? ?Allergies as of 11/25/2021 - Review Complete 11/25/2021  ?Allergen Reaction Noted  ? Codeine Nausea And Vomiting and Other (See Comments) 03/11/2016  ? ? ?Family History  ?Problem Relation Age of Onset  ? Hypertension Mother   ? Arthritis Mother   ? Heart failure Mother   ? Stroke Mother   ? Kidney disease Father   ? Hypertension Father   ? Colon cancer Neg Hx   ? Esophageal cancer Neg Hx   ? Rectal cancer Neg Hx   ? Stomach cancer Neg Hx   ? ? ?Social History  ? ?Socioeconomic History  ? Marital status: Single  ?  Spouse name: Not on file  ? Number of children: 1  ? Years of education: 36  ? Highest education level: Not on file  ?Occupational History  ? Occupation: retired  ?  Comment: retired  ?Tobacco Use  ? Smoking status: Former  ?  Types: Cigarettes  ?  Quit date: 07/21/2004  ?  Years since quitting: 17.3  ? Smokeless tobacco: Never  ?Vaping Use  ? Vaping Use: Never used  ?Substance and Sexual Activity  ? Alcohol use: Yes  ?  Comment: occasional  ? Drug use: No  ? Sexual activity: Not on file  ?Other Topics Concern  ? Not  on file  ?Social History Narrative  ? Lives one level with son/grandchildren; Left handed; high school grad; retired; walks for exercise; no caffeine  ? ?Social Determinants of Health  ? ?Financial Resource Strain: Low Risk   ? Difficulty of Paying Living Expenses: Not hard at all  ?Food Insecurity: No Food Insecurity  ? Worried About Charity fundraiser in the Last Year: Never  true  ? Ran Out of Food in the Last Year: Never true  ?Transportation Needs: No Transportation Needs  ? Lack of Transportation (Medical): No  ? Lack of Transportation (Non-Medical): No  ?Physical Activity: Insufficiently Active  ? Days of Exercise per Week: 2 days  ? Minutes of Exercise per Session: 10 min  ?Stress: No Stress Concern Present  ? Feeling of Stress : Not at all  ?Social Connections: Moderately Isolated  ? Frequency of Communication with Friends and Family: More than three times a week  ? Frequency of Social Gatherings with Friends and Family: More than three times a week  ? Attends Religious Services: More than 4 times per year  ? Active Member of Clubs or Organizations: No  ? Attends Archivist Meetings: Never  ? Marital Status: Never married  ?Intimate Partner Violence: Not At Risk  ? Fear of Current or Ex-Partner: No  ? Emotionally Abused: No  ? Physically Abused: No  ? Sexually Abused: No  ? ? ?Review of Systems: ? ?All systems reviewed an negative except where noted in HPI. ? ?Gen: Denies any fever, chills, sweats, anorexia, fatigue, weakness, malaise, weight loss, and sleep disorder ?CV: Denies chest pain, angina, palpitations, syncope, orthopnea, PND, peripheral edema, and claudication. ?Resp: Denies dyspnea at rest, dyspnea with exercise, cough, sputum, wheezing, coughing up blood, and pleurisy. ?GI: Denies vomiting blood, jaundice, and fecal incontinence.   Denies dysphagia or odynophagia. ?GU : Denies urinary burning, blood in urine, urinary frequency, urinary hesitancy, nocturnal urination, and urinary  incontinence. ?MS: Denies joint pain, limitation of movement, and swelling, stiffness, low back pain, extremity pain. Denies muscle weakness, cramps, atrophy.  ?Derm: Denies rash, itching, dry skin, hives, m

## 2021-11-25 NOTE — Op Note (Signed)
[place] ?Patient Name: Amanda Saunders ?Procedure Date: 11/25/2021 1:37 PM ?MRN: [id number] ?Endoscopist: Ladene Artist , MD ?Age: 66 ?Referring MD:  ?Date of Birth: 10/13/1955 ?Gender: Female ?Account #: [id number] ?Procedure:                Colonoscopy ?Indications:              Screening for colorectal malignant neoplasm ?Medicines:                Monitored Anesthesia Care ?Procedure:                Pre-Anesthesia Assessment: ?                          - Prior to the procedure, a History and Physical  ?                          was performed, and patient medications and  ?                          allergies were reviewed. The patient's tolerance of  ?                          previous anesthesia was also reviewed. The risks  ?                          and benefits of the procedure and the sedation  ?                          options and risks were discussed with the patient.  ?                          All questions were answered, and informed consent  ?                          was obtained. Prior Anticoagulants: The patient has  ?                          taken Eliquis (apixaban), last dose was 2 days  ?                          prior to procedure. ASA Grade Assessment: III - A  ?                          patient with severe systemic disease. After  ?                          reviewing the risks and benefits, the patient was  ?                          deemed in satisfactory condition to undergo the  ?                          procedure. ?  After obtaining informed consent, the colonoscope  ?                          was passed under direct vision. Throughout the  ?                          procedure, the patient's blood pressure, pulse, and  ?                          oxygen saturations were monitored continuously. The  ?                          CF [id number] #[id number] was introduced through the anus  ?                          and advanced to the the cecum, identified by  ?                           appendiceal orifice and ileocecal valve. The  ?                          ileocecal valve, appendiceal orifice, and rectum  ?                          were photographed. The quality of the bowel  ?                          preparation was good. The colonoscopy was performed  ?                          without difficulty. The patient tolerated the  ?                          procedure well. ?Scope In: 1:44:37 PM ?Scope Out: 1:59:33 PM ?Scope Withdrawal Time: 0 hours 12 minutes 29 seconds  ?Total Procedure Duration: 0 hours 14 minutes 56 seconds  ?Findings:                 The perianal and digital rectal examinations were  ?                          normal. ?                          A 7 mm polyp was found in the sigmoid colon. The  ?                          polyp was sessile. The polyp was removed with a  ?                          cold snare. Resection and retrieval were complete. ?                          Multiple small-mouthed diverticula were found in  ?  the left colon. There was evidence of diverticular  ?                          spasm. Peri-diverticular erythema was seen. There  ?                          was no evidence of diverticular bleeding. ?                          Anal papilla(e) were hypertrophied. ?                          Internal hemorrhoids were found during  ?                          retroflexion. The hemorrhoids were small and Grade  ?                          I (internal hemorrhoids that do not prolapse). ?                          The exam was otherwise without abnormality on  ?                          direct and retroflexion views. ?Complications:            No immediate complications. Estimated blood loss:  ?                          None. ?Estimated Blood Loss:     Estimated blood loss: none. ?Impression:               - One 7 mm polyp in the sigmoid colon, removed with  ?                          a cold snare. Resected and retrieved. ?                           - Moderate diverticulosis in the left colon. ?                          - Anal papilla(e) were hypertrophied. ?                          - Internal hemorrhoids. ?                          - The examination was otherwise normal on direct  ?                          and retroflexion views. ?Recommendation:           - Repeat colonoscopy after studies are complete for  ?                          surveillance based on pathology results. ?                          -  Resume Eliquis (apixaban) in 2 days at prior  ?                          dose. Refer to managing physician for further  ?                          adjustment of therapy. ?                          - Patient has a contact number available for  ?                          emergencies. The signs and symptoms of potential  ?                          delayed complications were discussed with the  ?                          patient. Return to normal activities tomorrow.  ?                          Written discharge instructions were provided to the  ?                          patient. ?                          - High fiber diet. ?                          - Continue present medications. ?                          - Await pathology results. ?                          - No aspirin, ibuprofen, naproxen, or other  ?                          non-steroidal anti-inflammatory drugs for 2 weeks  ?                          after polyp removal. ?Ladene Artist, MD ?11/25/2021 2:04:17 PM ?This report has been signed electronically. ?

## 2021-11-25 NOTE — Progress Notes (Signed)
Called to room to assist during endoscopic procedure.  Patient ID and intended procedure confirmed with present staff. Received instructions for my participation in the procedure from the performing physician.  

## 2021-11-25 NOTE — Progress Notes (Signed)
Report to PACU, RN, vss, BBS= Clear.  

## 2021-11-27 ENCOUNTER — Telehealth: Payer: Self-pay

## 2021-11-27 ENCOUNTER — Ambulatory Visit: Payer: Medicare Other | Admitting: Family Medicine

## 2021-11-27 NOTE — Telephone Encounter (Signed)
VOB submitted for Durolane, bilateral knee. BV pending. 

## 2021-11-29 ENCOUNTER — Telehealth: Payer: Medicare Other
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
## 2021-12-03 ENCOUNTER — Encounter: Payer: Self-pay | Admitting: Gastroenterology

## 2021-12-04 ENCOUNTER — Encounter: Payer: Self-pay | Admitting: Family Medicine

## 2021-12-04 ENCOUNTER — Ambulatory Visit (HOSPITAL_BASED_OUTPATIENT_CLINIC_OR_DEPARTMENT_OTHER)
Admission: RE | Admit: 2021-12-04 | Discharge: 2021-12-04 | Disposition: A | Discharge status: Home / Self Care | Payer: Medicare Other | Source: Ambulatory Visit | Attending: Family Medicine | Admitting: Family Medicine

## 2021-12-04 ENCOUNTER — Ambulatory Visit (INDEPENDENT_AMBULATORY_CARE_PROVIDER_SITE_OTHER): Payer: Medicare Other | Admitting: Family Medicine

## 2021-12-04 VITALS — BP 122/70 | HR 61 | Temp 98.1°F | Resp 18 | Wt 200.6 lb

## 2021-12-04 DIAGNOSIS — E119 Type 2 diabetes mellitus without complications: Secondary | ICD-10-CM | POA: Diagnosis not present

## 2021-12-04 DIAGNOSIS — E66812 Obesity, class 2: Secondary | ICD-10-CM

## 2021-12-04 DIAGNOSIS — M1711 Unilateral primary osteoarthritis, right knee: Secondary | ICD-10-CM | POA: Diagnosis not present

## 2021-12-04 DIAGNOSIS — I10 Essential (primary) hypertension: Secondary | ICD-10-CM | POA: Diagnosis not present

## 2021-12-04 DIAGNOSIS — M542 Cervicalgia: Secondary | ICD-10-CM

## 2021-12-04 DIAGNOSIS — M549 Dorsalgia, unspecified: Secondary | ICD-10-CM | POA: Diagnosis not present

## 2021-12-04 DIAGNOSIS — M25561 Pain in right knee: Secondary | ICD-10-CM | POA: Diagnosis not present

## 2021-12-04 DIAGNOSIS — M25562 Pain in left knee: Secondary | ICD-10-CM | POA: Diagnosis not present

## 2021-12-04 DIAGNOSIS — M170 Bilateral primary osteoarthritis of knee: Secondary | ICD-10-CM | POA: Diagnosis not present

## 2021-12-04 DIAGNOSIS — S8991XA Unspecified injury of right lower leg, initial encounter: Secondary | ICD-10-CM | POA: Diagnosis not present

## 2021-12-04 DIAGNOSIS — M1712 Unilateral primary osteoarthritis, left knee: Secondary | ICD-10-CM | POA: Diagnosis not present

## 2021-12-04 DIAGNOSIS — S8992XA Unspecified injury of left lower leg, initial encounter: Secondary | ICD-10-CM | POA: Diagnosis not present

## 2021-12-04 DIAGNOSIS — M545 Low back pain, unspecified: Secondary | ICD-10-CM

## 2021-12-04 LAB — HEMOGLOBIN A1C: Hgb A1c MFr Bld: 6.3 % (ref 4.6–6.5)

## 2021-12-04 MED ORDER — METHOCARBAMOL 500 MG PO TABS: 500.0000 mg | ORAL_TABLET | Freq: Three times a day (TID) | ORAL | 0 refills | Status: DC | PRN

## 2021-12-05 ENCOUNTER — Other Ambulatory Visit: Payer: Self-pay

## 2021-12-05 ENCOUNTER — Ambulatory Visit (HOSPITAL_BASED_OUTPATIENT_CLINIC_OR_DEPARTMENT_OTHER): Payer: Medicare Other

## 2021-12-05 DIAGNOSIS — M170 Bilateral primary osteoarthritis of knee: Secondary | ICD-10-CM

## 2021-12-05 MED ORDER — OZEMPIC (0.25 OR 0.5 MG/DOSE) 2 MG/1.5ML ~~LOC~~ SOPN: 0.2500 mg | PEN_INJECTOR | SUBCUTANEOUS | 1 refills | Status: DC

## 2021-12-06 ENCOUNTER — Encounter: Payer: Self-pay | Admitting: Family Medicine

## 2021-12-06 ENCOUNTER — Ambulatory Visit (HOSPITAL_BASED_OUTPATIENT_CLINIC_OR_DEPARTMENT_OTHER)
Admission: RE | Admit: 2021-12-06 | Discharge: 2021-12-06 | Disposition: A | Discharge status: Home / Self Care | Payer: Medicare Other | Source: Ambulatory Visit | Attending: Family Medicine | Admitting: Family Medicine

## 2021-12-06 DIAGNOSIS — I739 Peripheral vascular disease, unspecified: Secondary | ICD-10-CM | POA: Insufficient documentation

## 2021-12-06 DIAGNOSIS — Y929 Unspecified place or not applicable: Secondary | ICD-10-CM | POA: Diagnosis not present

## 2021-12-06 DIAGNOSIS — R42 Dizziness and giddiness: Secondary | ICD-10-CM | POA: Diagnosis not present

## 2021-12-06 DIAGNOSIS — M545 Low back pain, unspecified: Secondary | ICD-10-CM | POA: Diagnosis not present

## 2021-12-06 DIAGNOSIS — Y939 Activity, unspecified: Secondary | ICD-10-CM | POA: Diagnosis not present

## 2021-12-06 DIAGNOSIS — M47819 Spondylosis without myelopathy or radiculopathy, site unspecified: Secondary | ICD-10-CM | POA: Diagnosis not present

## 2021-12-06 DIAGNOSIS — R1110 Vomiting, unspecified: Secondary | ICD-10-CM | POA: Insufficient documentation

## 2021-12-06 DIAGNOSIS — R519 Headache, unspecified: Secondary | ICD-10-CM | POA: Insufficient documentation

## 2021-12-06 DIAGNOSIS — M4316 Spondylolisthesis, lumbar region: Secondary | ICD-10-CM | POA: Insufficient documentation

## 2021-12-09 ENCOUNTER — Encounter: Payer: Self-pay | Admitting: Neurology

## 2021-12-12 ENCOUNTER — Ambulatory Visit (INDEPENDENT_AMBULATORY_CARE_PROVIDER_SITE_OTHER): Payer: Medicare Other | Admitting: Orthopaedic Surgery

## 2021-12-12 DIAGNOSIS — M170 Bilateral primary osteoarthritis of knee: Secondary | ICD-10-CM

## 2021-12-12 NOTE — Progress Notes (Signed)
Office Visit Note   Patient: Amanda Saunders           Date of Birth: 1955-10-01           MRN: [id number] Visit Date: 12/12/2021              Requested by: Darreld Mclean, MD [address] [place],  [place] [address] PCP: Darreld Mclean, MD   Assessment & Plan: Visit Diagnoses:  1. Bilateral primary osteoarthritis of knee     Plan: Impression is bilateral knee osteoarthritis.  Today, we proceeded with bilateral knee Durolane injections.  She tolerated these well.  She will follow-up with [place] as needed.  Follow-Up Instructions: Return if symptoms worsen or fail to improve.   Orders:  Orders Placed This Encounter  Procedures   Large Joint Inj: bilateral knee   No orders of the defined types were placed in this encounter.     Procedures: Large Joint Inj: bilateral knee on 12/12/2021 8:09 AM Indications: pain Details: 22 G needle, anterolateral approach Medications (Right): 0.66 mL bupivacaine 0.25 %; 3 mL lidocaine 1 %; 60 mg Sodium Hyaluronate 60 MG/3ML Medications (Left): 0.66 mL bupivacaine 0.25 %; 3 mL lidocaine 1 %; 60 mg Sodium Hyaluronate 60 MG/3ML     Clinical Data: No additional findings.   Subjective: Chief Complaint  Patient presents with   Left Knee - Pain   Right Knee - Pain    HPI patient is a pleasant 66 year old female with underlying bilateral knee osteoarthritis who comes in today for bilateral knee Durolane injections.  She has had these in the past which provided a few months relief.     Objective: Vital Signs: There were no vitals taken for this visit.    Ortho Exam stable bilateral knee exam  Specialty Comments:  No specialty comments available.  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   Systemic lupus erythematosus ([place]) 08/04/2019   Acute ischemic stroke ([place]) 08/14/2018   HTN (hypertension)  08/14/2018   Controlled type 2 diabetes mellitus without complication, without long-term current use of insulin ([place]) 04/03/2016   History of CVA (cerebrovascular accident) 03/12/2016   Chest pain at rest 03/12/2016   Peripheral edema 03/12/2016   Past Medical History:  Diagnosis Date   Cardiomyopathy ([place])    Hypertension    Lupus ([place])    PUD (peptic ulcer disease)    Stroke ([place]) 08/13/2018   Vertigo     Family History  Problem Relation Age of Onset   Hypertension Mother    Arthritis Mother    Heart failure Mother    Stroke Mother    Kidney disease Father    Hypertension Father    Colon cancer Neg Hx    Esophageal cancer Neg Hx    Rectal cancer Neg Hx    Stomach cancer Neg Hx     Past Surgical History:  Procedure Laterality Date   BACK SURGERY     CARPAL TUNNEL RELEASE     COLONOSCOPY     KNEE CARTILAGE SURGERY     NECK SURGERY     2015   UPPER GASTROINTESTINAL ENDOSCOPY     Social History   Occupational History   Occupation: retired    Comment: retired  Tobacco Use   Smoking status: Former    Types: Cigarettes    Quit date: 07/21/2004    Years  since quitting: 17.4   Smokeless tobacco: Never  Vaping Use   Vaping Use: Never used  Substance and Sexual Activity   Alcohol use: Yes    Comment: occasional   Drug use: No   Sexual activity: Not on file

## 2021-12-14 MED ORDER — SODIUM HYALURONATE 60 MG/3ML IX PRSY
60.0000 mg | PREFILLED_SYRINGE | INTRA_ARTICULAR | Status: AC | PRN
  Administered 2021-12-12: 60 mg via INTRA_ARTICULAR

## 2021-12-14 MED ORDER — BUPIVACAINE HCL 0.25 % IJ SOLN
0.6600 mL | INTRAMUSCULAR | Status: AC | PRN
  Administered 2021-12-12: .66 mL via INTRA_ARTICULAR

## 2021-12-14 MED ORDER — LIDOCAINE HCL 1 % IJ SOLN
3.0000 mL | INTRAMUSCULAR | Status: AC | PRN
  Administered 2021-12-12: 3 mL

## 2021-12-30 ENCOUNTER — Ambulatory Visit: Payer: Medicare Other

## 2021-12-30 DIAGNOSIS — M329 Systemic lupus erythematosus, unspecified: Secondary | ICD-10-CM | POA: Diagnosis not present

## 2021-12-30 DIAGNOSIS — Z79899 Other long term (current) drug therapy: Secondary | ICD-10-CM | POA: Diagnosis not present

## 2022-01-02 ENCOUNTER — Ambulatory Visit (INDEPENDENT_AMBULATORY_CARE_PROVIDER_SITE_OTHER): Payer: Medicare Other

## 2022-01-02 VITALS — Ht 63.0 in | Wt 200.0 lb

## 2022-01-02 DIAGNOSIS — Z0000 Encounter for general adult medical examination without abnormal findings: Secondary | ICD-10-CM | POA: Diagnosis not present

## 2022-01-02 MED ORDER — BLOOD GLUCOSE METER KIT: PACK | 0 refills | Status: DC

## 2022-01-02 NOTE — Progress Notes (Signed)
Subjective:   Amanda Saunders is a 66 y.o. female who presents for Medicare Annual (Subsequent) preventive examination.  I connected with  Amanda Saunders on 01/02/22 by a audio enabled telemedicine application and verified that I am speaking with the correct person using two identifiers.  Patient Location: Home  Provider Location: Office/Clinic  I discussed the limitations of evaluation and management by telemedicine. The patient expressed understanding and agreed to proceed.   Review of Systems     Cardiac Risk Factors include: diabetes mellitus;hypertension     Objective:    Today's Vitals   01/02/22 1311  Weight: 200 lb (90.7 kg)  Height: '5\' 3"'$  (1.6 m)   Body mass index is 35.43 kg/m.     01/02/2022    1:10 PM 11/22/2021    5:40 PM 10/31/2021    8:54 PM 10/15/2021   11:05 AM 06/01/2021    3:17 AM 12/25/2020    9:43 AM 04/03/2020    9:05 AM  Advanced Directives  Does Patient Have a Medical Advance Directive? No No No No No No No  Would patient like information on creating a medical advance directive? No - Patient declined No - Patient declined No - Patient declined   Yes (MAU/Ambulatory/Procedural Areas - Information given)     Current Medications (verified) Outpatient Encounter Medications as of 01/02/2022  Medication Sig   atorvastatin (LIPITOR) 10 MG tablet    belimumab (BENLYSTA) 400 MG SOLR injection '10mg'$ /kg   diclofenac Sodium (VOLTAREN) 1 % GEL Apply 4 g topically 4 (four) times daily as needed.   ELIQUIS 5 MG TABS tablet TAKE ONE TABLET BY MOUTH EVERY MORNING and TAKE ONE TABLET BY MOUTH AT BEDTIME   gabapentin (NEURONTIN) 800 MG tablet Take 1 tablet (800 mg total) by mouth 2 (two) times daily.   hydrochlorothiazide (HYDRODIURIL) 25 MG tablet Take 1 tablet (25 mg total) by mouth daily.   hydroxychloroquine (PLAQUENIL) 200 MG tablet Take 1 tablet (200 mg total) by mouth 2 (two) times daily.   losartan (COZAAR) 50 MG tablet TAKE ONE TABLET BY MOUTH EVERY  MORNING   meclizine (ANTIVERT) 25 MG tablet TAKE ONE TABLET BY MOUTH twice daily AS NEEDED FOR dizziness   metFORMIN (GLUCOPHAGE) 500 MG tablet TAKE ONE TABLET BY MOUTH EVERYDAY AT BEDTIME   methocarbamol (ROBAXIN) 500 MG tablet Take 1 tablet (500 mg total) by mouth every 8 (eight) hours as needed for muscle spasms.   metoprolol succinate (TOPROL-XL) 25 MG 24 hr tablet TAKE ONE TABLET BY MOUTH EVERY MORNING   omeprazole (PRILOSEC) 40 MG capsule TAKE ONE CAPSULE BY MOUTH ONCE DAILY   ondansetron (ZOFRAN ODT) 8 MG disintegrating tablet Take 1 tablet (8 mg total) by mouth every 8 (eight) hours as needed for nausea or vomiting.   rosuvastatin (CRESTOR) 20 MG tablet TAKE ONE TABLET BY MOUTH EVERYDAY AT BEDTIME   Semaglutide,0.25 or 0.'5MG'$ /DOS, (OZEMPIC, 0.25 OR 0.5 MG/DOSE,) 2 MG/1.5ML SOPN Inject 0.25 mg into the skin once a week. May increase to 0.5 mg weekly after 4 weeks   sucralfate (CARAFATE) 1 g tablet TAKE ONE TABLET BY MOUTH FOUR TIMES DAILY with meals AND AT BEDTIME   No facility-administered encounter medications on file as of 01/02/2022.    Allergies (verified) Codeine   History: Past Medical History:  Diagnosis Date   Cardiomyopathy ([place])    Hypertension    Lupus ([place])    PUD (peptic ulcer disease)    Stroke ([place]) 08/13/2018   Vertigo  Past Surgical History:  Procedure Laterality Date   BACK SURGERY     CARPAL TUNNEL RELEASE     COLONOSCOPY     KNEE CARTILAGE SURGERY     NECK SURGERY     2015   UPPER GASTROINTESTINAL ENDOSCOPY     Family History  Problem Relation Age of Onset   Hypertension Mother    Arthritis Mother    Heart failure Mother    Stroke Mother    Kidney disease Father    Hypertension Father    Colon cancer Neg Hx    Esophageal cancer Neg Hx    Rectal cancer Neg Hx    Stomach cancer Neg Hx    Social History   Socioeconomic History   Marital status: Single    Spouse name: Not on file   Number of children: 1   Years of education: 12    Highest education level: Not on file  Occupational History   Occupation: retired    Comment: retired  Tobacco Use   Smoking status: Former    Types: Cigarettes    Quit date: 07/21/2004    Years since quitting: 17.4   Smokeless tobacco: Never  Vaping Use   Vaping Use: Never used  Substance and Sexual Activity   Alcohol use: Yes    Comment: occasional   Drug use: No   Sexual activity: Not on file  Other Topics Concern   Not on file  Social History Narrative   Lives one level with son/grandchildren; Left handed; high school grad; retired; walks for exercise; no caffeine   Social Determinants of Health   Financial Resource Strain: Low Risk  (09/06/2021)   Overall Financial Resource Strain (CARDIA)    Difficulty of Paying Living Expenses: Not hard at all  Food Insecurity: No Food Insecurity (12/25/2020)   Hunger Vital Sign    Worried About Running Out of Food in the Last Year: Never true    [place] in the Last Year: Never true  Transportation Needs: No Transportation Needs (12/25/2020)   PRAPARE - Hydrologist (Medical): No    Lack of Transportation (Non-Medical): No  Physical Activity: Insufficiently Active (02/21/2021)   Exercise Vital Sign    Days of Exercise per Week: 2 days    Minutes of Exercise per Session: 10 min  Stress: No Stress Concern Present (12/25/2020)   [place]    Feeling of Stress : Not at all  Social Connections: Moderately Isolated (12/25/2020)   Social Connection and Isolation Panel [NHANES]    Frequency of Communication with Friends and Family: More than three times a week    Frequency of Social Gatherings with Friends and Family: More than three times a week    Attends Religious Services: More than 4 times per year    Active Member of [employer] or Organizations: No    Attends Music therapist: Never    Marital Status: Never married    Tobacco  Counseling Counseling given: Not Answered   Clinical Intake:  Pre-visit preparation completed: Yes  Pain : No/denies pain     BMI - recorded: 35.43 Nutritional Status: BMI > 30  Obese Nutritional Risks: None Diabetes: Yes CBG done?: No Did pt. bring in CBG monitor from home?: No  How often do you need to have someone help you when you read instructions, pamphlets, or other written materials from your doctor or pharmacy?: 1 - Never  Diabetic?yes Nutrition Risk Assessment:  Has the patient had any N/V/D within the last 2 months?  No  Does the patient have any non-healing wounds?  No  Has the patient had any unintentional weight loss or weight gain?  No   Diabetes:  Is the patient diabetic?  Yes  If diabetic, was a CBG obtained today?  No  Did the patient bring in their glucometer from home?  No  How often do you monitor your CBG's? Once a week.   Financial Strains and Diabetes Management:  Are you having any financial strains with the device, your supplies or your medication? No .  Does the patient want to be seen by Chronic Care Management for management of their diabetes?  No  Would the patient like to be referred to a Nutritionist or for Diabetic Management?  No   Diabetic Exams:  Diabetic Eye Exam: Completed 08/08/21 Diabetic Foot Exam: Overdue, Pt has been advised about the importance in completing this exam. Pt is scheduled for diabetic foot exam on N/A.   Interpreter Needed?: No  Information entered by :: [place] [facility] Daily [facility]    01/02/2022    1:13 PM  In your present state of health, do you have any difficulty performing the following activities:  Hearing? 0  Vision? 0  Difficulty concentrating or making decisions? 0  Walking or climbing stairs? 1  Dressing or bathing? 0  Doing errands, shopping? 0  Preparing Food and eating ? N  Using the Toilet? N  In the past six months, have you accidently leaked urine? N  Do you have  problems with loss of bowel control? N  Managing your Medications? N  Managing your Finances? N  Housekeeping or managing your Housekeeping? N    Patient Care Team: Copland, Gay Filler, MD as PCP - General (Family Medicine) Stanford Breed Denice Bors, MD as PCP - Cardiology (Cardiology) Pieter Partridge, DO as Consulting Physician (Neurology) Rosita Kea, PA-C (Inactive) (Rheumatology) Cherre Robins, RPH-CPP (Pharmacist)  Indicate any recent Medical Services you may have received from other than [facility] providers in the past year (date may be approximate).     Assessment:   This is a routine wellness examination for Amanda Saunders.  Hearing/Vision screen No results found.  Dietary issues and exercise activities discussed: Current Exercise Habits: Home exercise routine, Type of exercise: walking, Time (Minutes): 30, Frequency (Times/Week): 7, Weekly Exercise (Minutes/Week): 210, Intensity: Mild   Goals Addressed             This Visit's Progress    Increase physical activity   On track    Walk 20 min 3x/week, (use 5 min rule)     Patient Stated   On track    Drink more water        Depression Screen    01/02/2022    1:10 PM 09/23/2021    9:53 AM 07/29/2021   11:11 AM 01/28/2021    3:10 PM 12/25/2020    9:45 AM 12/20/2019   10:58 AM 12/17/2018    9:05 AM  PHQ 2/9 Scores  PHQ - 2 Score 0 0 0 0 0 0 0    Fall Risk    01/02/2022    1:10 PM 10/15/2021   11:05 AM 09/23/2021    9:52 AM 07/29/2021   11:11 AM 02/01/2021    2:28 PM  Fall Risk   Falls in the past year? 0 0 0 0 0  Number falls in past  yr: 0 0 0 0 0  Injury with Fall? 0 0 0 0   Risk for fall due to : No Fall Risks  No Fall Risks    Follow up Falls evaluation completed  Falls evaluation completed      [place]:  Any stairs in or around the home? Yes  If so, are there any without handrails? No  Home free of loose throw rugs in walkways, pet beds, electrical cords, etc? Yes  Adequate lighting in  your home to reduce risk of falls? Yes   ASSISTIVE DEVICES UTILIZED TO PREVENT FALLS:  Life alert? No  Use of a cane, walker or w/c? No  Grab bars in the bathroom? No  Shower chair or bench in shower? No  Elevated toilet seat or a handicapped toilet? No   TIMED UP AND GO:  Was the test performed? No .    Cognitive Function:    05/14/2016   10:47 AM  MMSE - Mini Mental State Exam  Orientation to time 5  Orientation to Place 5  Registration 3  Attention/ Calculation 4  Recall 3  Language- name 2 objects 2  Language- repeat 1  Language- follow 3 step command 3  Language- read & follow direction 1  Write a sentence 1  Copy design 1  Total score 29        01/02/2022    1:15 PM  6CIT Screen  What Year? 0 points  What month? 0 points  What time? 0 points  Count back from 20 0 points  Months in reverse 0 points  Repeat phrase 0 points  Total Score 0 points    Immunizations Immunization History  Administered Date(s) Administered   Moderna Sars-Covid-2 Vaccination 11/17/2019, 12/15/2019, 07/06/2020   PNEUMOCOCCAL CONJUGATE-20 01/28/2021   Tdap 03/12/2016    TDAP status: Up to date  Flu Vaccine status: Declined, Education has been provided regarding the importance of this vaccine but patient still declined. Advised may receive this vaccine at local pharmacy or Health Dept. Aware to provide a copy of the vaccination record if obtained from local pharmacy or Health Dept. Verbalized acceptance and understanding.  Pneumococcal vaccine status: Up to date  Covid-19 vaccine status: Information provided on how to obtain vaccines.   Qualifies for Shingles Vaccine? No   Zostavax completed No   Shingrix Completed?: No.    Education has been provided regarding the importance of this vaccine. Patient has been advised to call insurance company to determine out of pocket expense if they have not yet received this vaccine. Advised may also receive vaccine at local pharmacy or  Health Dept. Verbalized acceptance and understanding.  Screening Tests Health Maintenance  Topic Date Due   FOOT EXAM  Never done   Zoster Vaccines- Shingrix (1 of 2) Never done   COVID-19 Vaccine (4 - Booster for Moderna series) 08/31/2020   INFLUENZA VACCINE  02/18/2022   HEMOGLOBIN A1C  06/06/2022   PAP SMEAR-Modifier  08/03/2022   OPHTHALMOLOGY EXAM  08/08/2022   MAMMOGRAM  08/27/2023   TETANUS/TDAP  03/12/2026   COLONOSCOPY (Pts 45-8yr Insurance coverage will need to be confirmed)  11/26/2031   Hepatitis C Screening  Completed   HIV Screening  Completed   HPV VACCINES  Aged Out    Health Maintenance  Health Maintenance Due  Topic Date Due   FOOT EXAM  Never done   Zoster Vaccines- Shingrix (1 of 2) Never done   COVID-19 Vaccine (4 -  Booster for Moderna series) 08/31/2020    Colorectal cancer screening: Type of screening: Colonoscopy. Completed 11/25/21. Repeat every 10 years  Mammogram status: Completed 08/26/21. Repeat every year    Lung Cancer Screening: (Low Dose CT Chest recommended if Age 26-80 years, 30 pack-year currently smoking OR have quit w/in 15years.) does not qualify.   Lung Cancer Screening Referral: N/A  Additional Screening:  Hepatitis C Screening: does qualify; Completed 08/02/18  Vision Screening: Recommended annual ophthalmology exams for early detection of glaucoma and other disorders of the eye. Is the patient up to date with their annual eye exam?  Yes  Who is the provider or what is the name of the office in which the patient attends annual eye exams? Dr. Herschel [place] If pt is not established with a provider, would they like to be referred to a provider to establish care? No .   Dental Screening: Recommended annual dental exams for proper oral hygiene  Community Resource Referral / Chronic Care Management: CRR required this visit?  No   CCM required this visit?  No      Plan:     I have personally reviewed and noted the following in the  patient's chart:   Medical and social history Use of alcohol, tobacco or illicit drugs  Current medications and supplements including opioid prescriptions.  Functional ability and status Nutritional status Physical activity Advanced directives List of other physicians Hospitalizations, surgeries, and ER visits in previous 12 months Vitals Screenings to include cognitive, depression, and falls Referrals and appointments  In addition, I have reviewed and discussed with patient certain preventive protocols, quality metrics, and best practice recommendations. A written personalized care plan for preventive services as well as general preventive health recommendations were provided to patient.     Duard Brady Marypat Kimmet, [place]   01/02/2022   Nurse Notes: none

## 2022-01-02 NOTE — Patient Instructions (Signed)
Amanda Saunders , Thank you for taking time to come for your Medicare Wellness Visit. I appreciate your ongoing commitment to your health goals. Please review the following plan we discussed and let me know if I can assist you in the future.   Screening recommendations/referrals: Colonoscopy: 11/25/21 due 11/26/31 Mammogram: 08/26/21 due 08/26/22 Bone Density: N/A Recommended yearly ophthalmology/optometry visit for glaucoma screening and checkup Recommended yearly dental visit for hygiene and checkup  Vaccinations: Influenza vaccine: Due-May obtain vaccine at our office or your local pharmacy.  Pneumococcal vaccine: up to date Tdap vaccine: up to date Shingles vaccine: Due-May obtain vaccine at your local pharmacy.   Covid-19: Due-May obtain vaccine at your local pharmacy.   Advanced directives: no, packet mailed  Conditions/risks identified: see problem list  Next appointment: Follow up in one year for your annual wellness visit. 01/06/23  Preventive Care 40-64 Years, Female Preventive care refers to lifestyle choices and visits with your health care provider that can promote health and wellness. What does preventive care include? A yearly physical exam. This is also called an annual well check. Dental exams once or twice a year. Routine eye exams. Ask your health care provider how often you should have your eyes checked. Personal lifestyle choices, including: Daily care of your teeth and gums. Regular physical activity. Eating a healthy diet. Avoiding tobacco and drug use. Limiting alcohol use. Practicing safe sex. Taking low-dose aspirin daily starting at age 57. Taking vitamin and mineral supplements as recommended by your health care provider. What happens during an annual well check? The services and screenings done by your health care provider during your annual well check will depend on your age, overall health, lifestyle risk factors, and family history of disease. Counseling   Your health care provider may ask you questions about your: Alcohol use. Tobacco use. Drug use. Emotional well-being. Home and relationship well-being. Sexual activity. Eating habits. Work and work Statistician. Method of birth control. Menstrual cycle. Pregnancy history. Screening  You may have the following tests or measurements: Height, weight, and BMI. Blood pressure. Lipid and cholesterol levels. These may be checked every 5 years, or more frequently if you are over 56 years old. Skin check. Lung cancer screening. You may have this screening every year starting at age 36 if you have a 30-pack-year history of smoking and currently smoke or have quit within the past 15 years. Fecal occult blood test (FOBT) of the stool. You may have this test every year starting at age 17. Flexible sigmoidoscopy or colonoscopy. You may have a sigmoidoscopy every 5 years or a colonoscopy every 10 years starting at age 63. Hepatitis C blood test. Hepatitis B blood test. Sexually transmitted disease (STD) testing. Diabetes screening. This is done by checking your blood sugar (glucose) after you have not eaten for a while (fasting). You may have this done every 1-3 years. Mammogram. This may be done every 1-2 years. Talk to your health care provider about when you should start having regular mammograms. This may depend on whether you have a family history of breast cancer. BRCA-related cancer screening. This may be done if you have a family history of breast, ovarian, tubal, or peritoneal cancers. Pelvic exam and Pap test. This may be done every 3 years starting at age 26. Starting at age 63, this may be done every 5 years if you have a Pap test in combination with an HPV test. Bone density scan. This is done to screen for osteoporosis. You may have this  scan if you are at high risk for osteoporosis. Discuss your test results, treatment options, and if necessary, the need for more tests with your health  care provider. Vaccines  Your health care provider may recommend certain vaccines, such as: Influenza vaccine. This is recommended every year. Tetanus, diphtheria, and acellular pertussis (Tdap, Td) vaccine. You may need a Td booster every 10 years. Zoster vaccine. You may need this after age 73. Pneumococcal 13-valent conjugate (PCV13) vaccine. You may need this if you have certain conditions and were not previously vaccinated. Pneumococcal polysaccharide (PPSV23) vaccine. You may need one or two doses if you smoke cigarettes or if you have certain conditions. Talk to your health care provider about which screenings and vaccines you need and how often you need them. This information is not intended to replace advice given to you by your health care provider. Make sure you discuss any questions you have with your health care provider. Document Released: 08/03/2015 Document Revised: 03/26/2016 Document Reviewed: 05/08/2015 Elsevier Interactive Patient Education  2017 [place] Prevention in the Home Falls can cause injuries. They can happen to people of all ages. There are many things you can do to make your home safe and to help prevent falls. What can I do on the outside of my home? Regularly fix the edges of walkways and driveways and fix any cracks. Remove anything that might make you trip as you walk through a door, such as a raised step or threshold. Trim any bushes or trees on the path to your home. Use bright outdoor lighting. Clear any walking paths of anything that might make someone trip, such as rocks or tools. Regularly check to see if handrails are loose or broken. Make sure that both sides of any steps have handrails. Any raised decks and porches should have guardrails on the edges. Have any leaves, snow, or ice cleared regularly. Use sand or salt on walking paths during winter. Clean up any spills in your garage right away. This includes oil or grease  spills. What can I do in the bathroom? Use night lights. Install grab bars by the toilet and in the tub and shower. Do not use towel bars as grab bars. Use non-skid mats or decals in the tub or shower. If you need to sit down in the shower, use a plastic, non-slip stool. Keep the floor dry. Clean up any water that spills on the floor as soon as it happens. Remove soap buildup in the tub or shower regularly. Attach bath mats securely with double-sided non-slip rug tape. Do not have throw rugs and other things on the floor that can make you trip. What can I do in the bedroom? Use night lights. Make sure that you have a light by your bed that is easy to reach. Do not use any sheets or blankets that are too big for your bed. They should not hang down onto the floor. Have a firm chair that has side arms. You can use this for support while you get dressed. Do not have throw rugs and other things on the floor that can make you trip. What can I do in the kitchen? Clean up any spills right away. Avoid walking on wet floors. Keep items that you use a lot in easy-to-reach places. If you need to reach something above you, use a strong step stool that has a grab bar. Keep electrical cords out of the way. Do not use floor polish or wax  that makes floors slippery. If you must use wax, use non-skid floor wax. Do not have throw rugs and other things on the floor that can make you trip. What can I do with my stairs? Do not leave any items on the stairs. Make sure that there are handrails on both sides of the stairs and use them. Fix handrails that are broken or loose. Make sure that handrails are as long as the stairways. Check any carpeting to make sure that it is firmly attached to the stairs. Fix any carpet that is loose or worn. Avoid having throw rugs at the top or bottom of the stairs. If you do have throw rugs, attach them to the floor with carpet tape. Make sure that you have a light switch at the  top of the stairs and the bottom of the stairs. If you do not have them, ask someone to add them for you. What else can I do to help prevent falls? Wear shoes that: Do not have high heels. Have rubber bottoms. Are comfortable and fit you well. Are closed at the toe. Do not wear sandals. If you use a stepladder: Make sure that it is fully opened. Do not climb a closed stepladder. Make sure that both sides of the stepladder are locked into place. Ask someone to hold it for you, if possible. Clearly mark and make sure that you can see: Any grab bars or handrails. First and last steps. Where the edge of each step is. Use tools that help you move around (mobility aids) if they are needed. These include: Canes. Walkers. Scooters. Crutches. Turn on the lights when you go into a dark area. Replace any light bulbs as soon as they burn out. Set up your furniture so you have a clear path. Avoid moving your furniture around. If any of your floors are uneven, fix them. If there are any pets around you, be aware of where they are. Review your medicines with your doctor. Some medicines can make you feel dizzy. This can increase your chance of falling. Ask your doctor what other things that you can do to help prevent falls. This information is not intended to replace advice given to you by your health care provider. Make sure you discuss any questions you have with your health care provider. Document Released: 05/03/2009 Document Revised: 12/13/2015 Document Reviewed: 08/11/2014 Elsevier Interactive Patient Education  2017 [employer].
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
## 2022-01-24 ENCOUNTER — Ambulatory Visit: Payer: Medicare Other | Admitting: Neurology

## 2022-01-29 ENCOUNTER — Ambulatory Visit: Payer: Medicare Other | Admitting: Family Medicine

## 2022-01-29 DIAGNOSIS — E785 Hyperlipidemia, unspecified: Secondary | ICD-10-CM

## 2022-01-29 DIAGNOSIS — E119 Type 2 diabetes mellitus without complications: Secondary | ICD-10-CM

## 2022-01-29 DIAGNOSIS — I10 Essential (primary) hypertension: Secondary | ICD-10-CM

## 2022-01-30 DIAGNOSIS — M329 Systemic lupus erythematosus, unspecified: Secondary | ICD-10-CM | POA: Diagnosis not present

## 2022-02-03 ENCOUNTER — Ambulatory Visit: Payer: Medicare Other | Admitting: Family Medicine

## 2022-02-06 ENCOUNTER — Other Ambulatory Visit: Payer: Self-pay | Admitting: Cardiology

## 2022-02-06 ENCOUNTER — Other Ambulatory Visit: Payer: Self-pay | Admitting: Student

## 2022-02-06 ENCOUNTER — Other Ambulatory Visit: Payer: Self-pay | Admitting: Family Medicine

## 2022-02-06 DIAGNOSIS — R1013 Epigastric pain: Secondary | ICD-10-CM

## 2022-02-06 DIAGNOSIS — R072 Precordial pain: Secondary | ICD-10-CM

## 2022-02-06 DIAGNOSIS — R0609 Other forms of dyspnea: Secondary | ICD-10-CM

## 2022-02-06 DIAGNOSIS — I10 Essential (primary) hypertension: Secondary | ICD-10-CM

## 2022-02-06 DIAGNOSIS — Z8673 Personal history of transient ischemic attack (TIA), and cerebral infarction without residual deficits: Secondary | ICD-10-CM

## 2022-02-06 NOTE — Telephone Encounter (Signed)
Prescription refill request for Eliquis received. Indication:CVA Last office visit:03/11/21 ([place]) Scr: 1.22 (09/23/21) Age: 66 Weight: 90.7kg  Appropriate dose and refill sent to requested pharmacy.

## 2022-02-09 ENCOUNTER — Other Ambulatory Visit: Payer: Self-pay | Admitting: Family Medicine

## 2022-02-09 DIAGNOSIS — Z6835 Body mass index (BMI) 35.0-35.9, adult: Secondary | ICD-10-CM

## 2022-02-09 DIAGNOSIS — E119 Type 2 diabetes mellitus without complications: Secondary | ICD-10-CM

## 2022-02-11 ENCOUNTER — Other Ambulatory Visit: Payer: Self-pay | Admitting: Family Medicine

## 2022-02-11 DIAGNOSIS — I428 Other cardiomyopathies: Secondary | ICD-10-CM
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
## 2022-02-11 NOTE — Patient Instructions (Incomplete)
It was good to see you again today- I will be in touch with your labs and chest x-ray asap I will get in touch with Dr Amil Amen also for you  Please let me know if any changes in your condition Let's have you check your temp twice a day- am and pm- and keep a log Please check daily for 2-3 weeks

## 2022-02-13 ENCOUNTER — Ambulatory Visit (INDEPENDENT_AMBULATORY_CARE_PROVIDER_SITE_OTHER): Payer: Medicare Other | Admitting: Family Medicine

## 2022-02-13 ENCOUNTER — Encounter: Payer: Self-pay | Admitting: Family Medicine

## 2022-02-13 VITALS — BP 112/62 | HR 78 | Temp 97.8°F | Resp 18 | Ht 63.0 in | Wt 193.0 lb

## 2022-02-13 DIAGNOSIS — E119 Type 2 diabetes mellitus without complications: Secondary | ICD-10-CM

## 2022-02-13 DIAGNOSIS — I10 Essential (primary) hypertension: Secondary | ICD-10-CM | POA: Diagnosis not present

## 2022-02-13 DIAGNOSIS — R509 Fever, unspecified: Secondary | ICD-10-CM | POA: Diagnosis not present

## 2022-02-13 LAB — COMPREHENSIVE METABOLIC PANEL
ALT: 27 U/L (ref 0–35)
AST: 27 U/L (ref 0–37)
Albumin: 4.4 g/dL (ref 3.5–5.2)
Alkaline Phosphatase: 75 U/L (ref 39–117)
BUN: 12 mg/dL (ref 6–23)
CO2: 31 mEq/L (ref 19–32)
Calcium: 9.2 mg/dL (ref 8.4–10.5)
Chloride: 101 mEq/L (ref 96–112)
Creatinine, Ser: 0.92 mg/dL (ref 0.40–1.20)
GFR: 65.62 mL/min (ref >60.00)
Glucose, Bld: 90 mg/dL (ref 70–99)
Potassium: 4.4 mEq/L (ref 3.5–5.1)
Sodium: 141 mEq/L (ref 135–145)
Total Bilirubin: 0.3 mg/dL (ref 0.2–1.2)
Total Protein: 6.9 g/dL (ref 6.0–8.3)

## 2022-02-13 LAB — CBC
HCT: 38.2 % (ref 36.0–46.0)
Hemoglobin: 12.8 g/dL (ref 12.0–15.0)
MCHC: 33.4 g/dL (ref 30.0–36.0)
MCV: 83.5 fl (ref 78.0–100.0)
Platelets: 221 10*3/uL (ref 150.0–400.0)
RBC: 4.58 Mil/uL (ref 3.87–5.11)
RDW: 14.6 % (ref 11.5–15.5)
WBC: 5.3 10*3/uL (ref 4.0–10.5)

## 2022-02-13 LAB — TSH: TSH: 2.57 u[IU]/mL (ref 0.35–5.50)

## 2022-02-13 LAB — SEDIMENTATION RATE: Sed Rate: 13 mm/hr (ref 0–30)

## 2022-02-14 ENCOUNTER — Other Ambulatory Visit: Payer: Self-pay | Admitting: Family Medicine

## 2022-02-14 LAB — HIV ANTIBODY (ROUTINE TESTING W REFLEX): HIV 1&2 Ab, 4th Generation: NONREACTIVE

## 2022-02-17 ENCOUNTER — Encounter: Payer: Self-pay | Admitting: Family Medicine

## 2022-02-17 LAB — QUANTIFERON-TB GOLD PLUS
Mitogen-NIL: >10 IU/mL
NIL: 0.06 IU/mL
QuantiFERON-TB Gold Plus: NEGATIVE
TB1-NIL: 0 IU/mL
TB2-NIL: <0 IU/mL

## 2022-02-26 ENCOUNTER — Encounter: Payer: Self-pay | Admitting: Family Medicine

## 2022-02-27 DIAGNOSIS — M329 Systemic lupus erythematosus, unspecified: Secondary | ICD-10-CM | POA: Diagnosis not present

## 2022-03-03 ENCOUNTER — Telehealth: Payer: Self-pay | Admitting: *Deleted

## 2022-03-03 NOTE — Chronic Care Management (AMB) (Signed)
  Chronic Care Management Note  03/03/2022 Name: [name] MRN: [id number] DOB: 18-May-1956  [name] is a 66 y.o. year old female who is a primary care patient of Copland, Gay Filler, MD and is actively engaged with the care management team. I reached out to [name] by phone today to assist with re-scheduling a follow up visit with the Pharmacist  Follow up plan: Telephone appointment with care management team member scheduled for: 03/11/2022  Julian Hy, [place] Direct Dial: [phone]

## 2022-03-11 ENCOUNTER — Telehealth: Payer: Medicare Other

## 2022-03-11 ENCOUNTER — Telehealth: Payer: Self-pay | Admitting: Pharmacist

## 2022-03-11 NOTE — Telephone Encounter (Signed)
  Care Management   Follow Up Note   03/11/2022 Name: MOSELLA KASA MRN: [id number] DOB: Oct 22, 1955   Referred by: Darreld Mclean, MD Reason for referral : No chief complaint on file.   A second unsuccessful telephone outreach was attempted today. The patient was referred to the case management team for assistance with care management and care coordination.   Follow Up Plan: The care management team will reach out to the patient again over the next 30 days.   Cherre Robins, PharmD Clinical Pharmacist [facility]

## 2022-03-14 ENCOUNTER — Ambulatory Visit (INDEPENDENT_AMBULATORY_CARE_PROVIDER_SITE_OTHER): Payer: Medicare Other | Admitting: Orthopaedic Surgery

## 2022-03-14 ENCOUNTER — Telehealth: Payer: Self-pay

## 2022-03-14 ENCOUNTER — Encounter: Payer: Self-pay | Admitting: Orthopaedic Surgery

## 2022-03-14 DIAGNOSIS — M170 Bilateral primary osteoarthritis of knee: Secondary | ICD-10-CM

## 2022-03-14 MED ORDER — BUPIVACAINE HCL 0.5 % IJ SOLN
2.0000 mL | INTRAMUSCULAR | Status: AC | PRN
  Administered 2022-03-14: 2 mL via INTRA_ARTICULAR

## 2022-03-14 MED ORDER — LIDOCAINE HCL 1 % IJ SOLN
2.0000 mL | INTRAMUSCULAR | Status: AC | PRN
  Administered 2022-03-14: 2 mL

## 2022-03-14 MED ORDER — METHYLPREDNISOLONE ACETATE 40 MG/ML IJ SUSP
40.0000 mg | INTRAMUSCULAR | Status: AC | PRN
  Administered 2022-03-14: 40 mg via INTRA_ARTICULAR

## 2022-03-14 NOTE — Telephone Encounter (Signed)
Next available gel injection will need to be after 06/14/2022, due to last gel injection being done on 12/12/2021. Will submit in November, 2023.

## 2022-03-14 NOTE — Telephone Encounter (Signed)
Please precert for bilateral visco. Dr.Xu's patient.

## 2022-03-14 NOTE — Progress Notes (Signed)
Office Visit Note   Patient: Amanda Saunders           Date of Birth: 07/27/1955           MRN: [id number] Visit Date: 03/14/2022              Requested by: Amanda Mclean, MD Amanda Saunders [address] [place],  Amanda Saunders [address] PCP: Amanda Mclean, MD   Assessment & Plan: Visit Diagnoses:  1. Bilateral primary osteoarthritis of knee     Plan: Patient underwent bilateral knee cortisone injections today.  We will get approval for Visco for 3 months from now.  Follow-up at that time.  This patient is diagnosed with osteoarthritis of the knee(s).    Radiographs show evidence of joint space narrowing, osteophytes, subchondral sclerosis and/or subchondral cysts.  This patient has knee pain which interferes with functional and activities of daily living.    This patient has experienced inadequate response, adverse effects and/or intolerance with conservative treatments such as acetaminophen, NSAIDS, topical creams, physical therapy or regular exercise, knee bracing and/or weight loss.   This patient has experienced inadequate response or has a contraindication to intra articular steroid injections for at least 3 months.   This patient is not scheduled to have a total knee replacement within 6 months of starting treatment with viscosupplementation.  Follow-Up Instructions: No follow-ups on file.   Orders:  No orders of the defined types were placed in this encounter.  No orders of the defined types were placed in this encounter.     Procedures: Large Joint Inj: bilateral knee on 03/14/2022 8:05 AM Indications: pain Details: 22 G needle  Arthrogram: No  Medications (Right): 2 mL lidocaine 1 %; 2 mL bupivacaine 0.5 %; 40 mg methylPREDNISolone acetate 40 MG/ML Medications (Left): 2 mL lidocaine 1 %; 2 mL bupivacaine 0.5 %; 40 mg methylPREDNISolone acetate 40 MG/ML Outcome: tolerated well, no immediate complications Patient was prepped and draped in the usual  sterile fashion.       Clinical Data: No additional findings.   Subjective: Chief Complaint  Patient presents with   Right Knee - Pain   Left Knee - Pain    HPI Amanda Saunders returns today for bilateral knee OA.  Requesting bilateral knee cortisone injections today.  She feels like the relief from Durolane has already worn off.  Review of Systems   Objective: Vital Signs: There were no vitals taken for this visit.  Physical Exam  Ortho Exam  Exam is unchanged Specialty Comments:  No specialty comments available.  Imaging: No results found.   PMFS History: Patient Active Problem List   Diagnosis Date Noted   Noncompaction cardiomyopathy (Amanda Saunders) 11/23/2020   Systemic lupus erythematosus (Amanda Saunders) 08/04/2019   Acute ischemic stroke (Amanda Saunders) 08/14/2018   HTN (hypertension) 08/14/2018   Controlled type 2 diabetes mellitus without complication, without long-term current use of insulin (Amanda Saunders) 04/03/2016   History of CVA (cerebrovascular accident) 03/12/2016   Chest pain at rest 03/12/2016   Peripheral edema 03/12/2016   Past Medical History:  Diagnosis Date   Cardiomyopathy (Amanda Saunders)    Hypertension    Lupus (Amanda Saunders)    PUD (peptic ulcer disease)    Stroke (Amanda Saunders) 08/13/2018   Vertigo     Family History  Problem Relation Age of Onset   Hypertension Mother    Arthritis Mother    Heart failure Mother    Stroke Mother    Kidney disease Father    Hypertension Father  Colon cancer Neg Hx    Esophageal cancer Neg Hx    Rectal cancer Neg Hx    Stomach cancer Neg Hx     Past Surgical History:  Procedure Laterality Date   BACK SURGERY     CARPAL TUNNEL RELEASE     COLONOSCOPY     KNEE CARTILAGE SURGERY     NECK SURGERY     2015   UPPER GASTROINTESTINAL ENDOSCOPY     Social History   Occupational History   Occupation: retired    Comment: retired  Tobacco Use   Smoking status: Former    Types: Cigarettes    Quit date: 07/21/2004    Years since quitting: 17.6   Smokeless  tobacco: Never  Vaping Use   Vaping Use: Never used  Substance and Sexual Activity   Alcohol use: Yes    Comment: occasional   Drug use: No   Sexual activity: Not on file

## 2022-03-19 NOTE — Chronic Care Management (AMB) (Signed)
  Chronic Care Management Note  03/19/2022 Name: Amanda Saunders MRN: [id number] DOB: 16-Mar-1956  Amanda Saunders is a 66 y.o. year old female who is a primary care patient of Copland, Gay Filler, MD and is actively engaged with the care management team. I reached out to [name] by phone today to assist with re-scheduling a follow up visit with the Pharmacist  Follow up plan: Telephone appointment with care management team member scheduled for: 03/27/2022  Julian Hy, [place] Direct Dial: [phone]
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
## 2022-03-26 ENCOUNTER — Ambulatory Visit: Payer: Medicare Other | Admitting: Neurology

## 2022-03-27 ENCOUNTER — Ambulatory Visit (INDEPENDENT_AMBULATORY_CARE_PROVIDER_SITE_OTHER): Payer: Medicare Other | Admitting: Pharmacist

## 2022-03-27 ENCOUNTER — Telehealth: Payer: Self-pay | Admitting: Pharmacist

## 2022-03-27 DIAGNOSIS — E119 Type 2 diabetes mellitus without complications: Secondary | ICD-10-CM

## 2022-03-27 DIAGNOSIS — R1013 Epigastric pain: Secondary | ICD-10-CM

## 2022-03-27 DIAGNOSIS — I10 Essential (primary) hypertension: Secondary | ICD-10-CM

## 2022-03-27 MED ORDER — SUCRALFATE 1 G PO TABS: 1.0000 g | ORAL_TABLET | Freq: Three times a day (TID) | ORAL | 1 refills | Status: DC

## 2022-03-27 MED ORDER — TRAMADOL HCL 50 MG PO TABS: 50.0000 mg | ORAL_TABLET | Freq: Two times a day (BID) | ORAL | 0 refills | Status: DC | PRN

## 2022-03-27 NOTE — Patient Instructions (Signed)
[name] It was a pleasure speaking with you today.  Below is a summary of your health goals and summary of our recent visit. You can also view your updated Chronic Care Management Care plan through your MyChart account.    As always if you have any questions or concerns especially regarding medications, please feel free to contact me either at the phone number below or with a MyChart message.   Keep up the good work!  Amanda Saunders, PharmD Clinical Pharmacist [facility] [phone] (direct line)  [phone] (main office number)   Chronic Care Management Care Plan   Hypertension / cardiomyopathy / swelling: BP Readings from Last 3 Encounters:  02/13/22 112/62  12/04/21 122/70  11/25/21 124/67   Pharmacist Clinical Goal(s): Over the next 180 days, patient will work with PharmD and providers to maintain BP goal <140/90 Current regimen:  Losartan '50mg'$  daily Hydrochlorothiazide '25mg'$  every day (dose increased 11/23/2020) Metoprolol succinate '25mg'$  daily Interventions: Requested patient to check blood pressure 2 times per week and record Discussed weighing daily; patient to consider using [facility] quarterly $40 OTC benefits to purchase scale Patient self care activities - Over the next 180 days, patient will: Check blood pressure 2 times per week and when you experience dizziness. Document, and provide at future appointments Purchase scale and check weight daily as recommended by cardiology. Ensure daily salt intake < 2300 mg/day  Hyperlipidemia / history of ischemic stroke Lab Results  Component Value Date/Time   [facility] 66 03/11/2021 09:29 AM   Pharmacist Clinical Goal(s): Over the next 180 days, patient will work with PharmD and providers to maintain LDL goal < 70 Current regimen:  Rosuvastatin '20mg'$  daily Eliquis '5mg'$  twice a day Interventions: Review last lipid panel and LDL goal Patient self care activities - Over the  next 180 days, patient will: Maintain cholesterol medication regimen.  Diabetes Lab Results  Component Value Date/Time   HGBA1C 6.3 12/04/2021 09:44 AM   HGBA1C 6.4 07/29/2021 11:26 AM   Pharmacist Clinical Goal(s): Over the next 180 days, patient will work with PharmD and providers to maintain A1c goal <6.5% Current regimen:  Metformin '500mg'$  - take 1 tablet daily with evening meal Ozempic 0.'25mg'$  weekly Patient self care activities - Over the next 180 days, patient will: Obtain A1c <6.5% Continue to limit sugar and carbohydrate intake.  Continue to take metformin '500mg'$  daily with evening meal  Increase Ozempic - inject 0.'5mg'$  subcutaneously / under the skin once a week   Abdominal Pain / history of ulcer: Pharmacist Clinical Goal(s): Over the next 180 days, patient will work with PharmD and providers to improved abdominal pain and reduce risk of recurrence of ulcer Current regimen:  Omeprazole '40mg'$  daily  Sucralfate 1 gram 4 times a day Interventions: Discussed medications that can increase risk of ulcer.  Continue to avoid naprosyn and other over the counter anti-inflammatory medications (Naproxen, ibuprofen, Aleve, Motrin, Advil) Patient self care activities - Over the next 180 days, patient will: Avoid over the counter anti-inflammatory medications (ibuprofen, naproxen, Advil, Motrin and Aleve) Can use Tylenol / acetaminophen for pain, headache, fever if needed (per Dr Tomi Likens limit to 2 doses per week to prevent rebound headaches) Continue current regimen   Headaches: Pharmacist Clinical Goal(s): Over the next 180 days, patient will work with PharmD and providers to decrease frequency of headaches Current regimen:  Tylenol - as needed; limit to no more than 2 doses per week Gabapentin '600mg'$  each morning and at  bedtime Methocarbamol '500mg'$  - take 1 tablet every 8 hours if needed for muscle spasms / headache Interventions: Reviewed recommendation from Dr Georgie Chard last  visit Discussed pros and cons of nortriptyline trial - patient declines at this time as headaches improving. Patient self care activities - Over the next 180 days, patient will: Can use Tylenol / acetaminophen up to 2 times per week per Dr Tomi Likens Continue other medications for headache prevention / treatment  Health Maintenance  Pharmacist Clinical Goal(s) Over the next 180 days, patient will work with PharmD and providers to Remain up to date on health maintenance  Interventions: Recommended patient complete Shingrix vaccine series Discussed getting preventative procedure - eye exam, mammogram and dental exam.  Patient self care activities - Over the next 180 days, patient will: Complete Shingrix vaccine series if agreeable Get annual flu vaccine  Medication management Pharmacist Clinical Goal(s): Over the next 180 days, patient will work with PharmD and providers to maintain optimal medication adherence Current pharmacy: [facility] Comprehensive medication review performed. When you are back home in [place] we had assist in transfer of medications back to you preferred pharmacy and packaging if needed.  Patient self care activities - Over the next 90 days, patient will: Focus on medication adherence by filling medications appropriately  Take medications as prescribed Report any questions or concerns to PharmD and/or provider(s)   Patient Goals/Self-Care Activities Over the next 90 days, patient will:  take medications as prescribed,  check blood pressure 2 times per week and when you experience dizziness. Document, and provide at future appointments. Contact office if blood pressure is less than 100/60 or above 140/90.  weigh daily, and contact provider if weight gain of more than 3lbs in 24 hours or 5 lbs in 1 week. Start exercise program with goal to work up to at least 150 minutes of exercise per week.  Increase Ozempic to 0.'5mg'$  - inject subcutaneously weekly  Follow  Up Plan: Patient is doing well; no further follow up needed.    Please see past updates related to this goal by clicking on the "Past Updates" button in the selected goal    Patient verbalizes understanding of instructions and care plan provided today and agrees to view in [place]. Active MyChart status and patient understanding of how to access instructions and care plan via MyChart confirmed with patient.
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
## 2022-03-28 ENCOUNTER — Telehealth: Payer: Self-pay

## 2022-03-28 NOTE — Telephone Encounter (Signed)
PA initiated today  Determination: Approved today Request Reference Number: [id number]. TRAMADOL HCL TAB '50MG'$  is approved through 04/27/2022. Your patient may now fill this prescription and it will be covered.

## 2022-04-03 DIAGNOSIS — M329 Systemic lupus erythematosus, unspecified: Secondary | ICD-10-CM | POA: Diagnosis not present

## 2022-04-03 DIAGNOSIS — Z79899 Other long term (current) drug therapy: Secondary | ICD-10-CM | POA: Diagnosis not present

## 2022-04-11 ENCOUNTER — Other Ambulatory Visit: Payer: Self-pay

## 2022-04-11 ENCOUNTER — Emergency Department (HOSPITAL_BASED_OUTPATIENT_CLINIC_OR_DEPARTMENT_OTHER)
Admission: EM | Admit: 2022-04-11 | Discharge: 2022-04-11 | Disposition: A | Discharge status: Home / Self Care | Payer: Medicare Other | Attending: Emergency Medicine | Admitting: Emergency Medicine

## 2022-04-11 ENCOUNTER — Encounter (HOSPITAL_BASED_OUTPATIENT_CLINIC_OR_DEPARTMENT_OTHER): Payer: Self-pay | Admitting: Emergency Medicine

## 2022-04-11 DIAGNOSIS — R110 Nausea: Secondary | ICD-10-CM | POA: Insufficient documentation

## 2022-04-11 DIAGNOSIS — B349 Viral infection, unspecified: Secondary | ICD-10-CM

## 2022-04-11 DIAGNOSIS — M791 Myalgia, unspecified site: Secondary | ICD-10-CM | POA: Insufficient documentation

## 2022-04-11 DIAGNOSIS — Z7901 Long term (current) use of anticoagulants: Secondary | ICD-10-CM | POA: Insufficient documentation

## 2022-04-11 DIAGNOSIS — Z20822 Contact with and (suspected) exposure to covid-19: Secondary | ICD-10-CM | POA: Diagnosis not present

## 2022-04-11 DIAGNOSIS — R509 Fever, unspecified: Secondary | ICD-10-CM | POA: Insufficient documentation

## 2022-04-11 HISTORY — DX: Unspecified osteoarthritis, unspecified site: M19.90

## 2022-04-11 LAB — RESP PANEL BY RT-PCR (FLU A&B, COVID) ARPGX2
Influenza A by PCR: NEGATIVE
Influenza B by PCR: NEGATIVE
SARS Coronavirus 2 by RT PCR: NEGATIVE

## 2022-04-11 MED ORDER — IBUPROFEN 800 MG PO TABS
800.0000 mg | ORAL_TABLET | Freq: Once | ORAL | Status: AC
  Administered 2022-04-11: 800 mg via ORAL
  Filled 2022-04-11: qty 1

## 2022-04-11 MED ORDER — ONDANSETRON 4 MG PO TBDP
8.0000 mg | ORAL_TABLET | Freq: Once | ORAL | Status: AC
  Administered 2022-04-11: 8 mg via ORAL
  Filled 2022-04-11: qty 2

## 2022-04-11 MED ORDER — ACETAMINOPHEN 500 MG PO TABS
1000.0000 mg | ORAL_TABLET | Freq: Once | ORAL | Status: AC
  Administered 2022-04-11: 1000 mg via ORAL
  Filled 2022-04-11: qty 2

## 2022-04-11 NOTE — ED Provider Notes (Addendum)
[facility] Provider Note   CSN: [id number] Arrival date & time: 04/11/22  0401     History  Chief Complaint  Patient presents with   Fever    Amanda Saunders is a 66 y.o. female.  The history is provided by the patient.  Fever Max temp prior to arrival:  100.6 Temp source:  Oral Severity:  Moderate Onset quality:  Gradual Duration:  2 days Timing:  Constant Progression:  Unchanged Chronicity:  New Relieved by:  Nothing Worsened by:  Nothing Ineffective treatments:  None tried Associated symptoms: myalgias and nausea   Associated symptoms: no chest pain, no confusion, no congestion, no cough, no diarrhea, no dysuria, no rash and no vomiting   Associated symptoms comment:  Here with her husband who has the same symptoms.  Risk factors: sick contacts   Risk factors: no recent sickness   Here with her husband who is also a patient for fevers and body aches since Tuesday.  Her husband was exposed [facility].  Now they both have the same symptoms.  She has not taken medications for 24 hours.       Home Medications Prior to Admission medications   Medication Sig Start Date End Date Taking? Authorizing Provider  ACCU-CHEK GUIDE test strip USE UP TO FOUR TIMES DAILY AS DIRECTED 02/14/22   Copland, Gay Filler, MD  Accu-Chek Softclix Lancets lancets USE UP TO FOUR TIMES DAILY AS DIRECTED 02/14/22   Copland, Gay Filler, MD  belimumab (BENLYSTA) 400 MG SOLR injection Inject into the vein every 30 (thirty) days.    [provider]  Blood Glucose Monitoring Suppl (ACCU-CHEK GUIDE) w/Device KIT USE UP TO FOUR TIMES DAILY AS DIRECTED 02/14/22   Copland, Gay Filler, MD  diclofenac Sodium (VOLTAREN) 1 % GEL Apply 4 g topically 4 (four) times daily as needed. 03/08/21   Hilts, Legrand Como, MD  ELIQUIS 5 MG TABS tablet TAKE ONE TABLET BY MOUTH EVERY MORNING and TAKE ONE TABLET BY MOUTH EVERYDAY AT BEDTIME 02/06/22    Lelon Perla, MD  gabapentin (NEURONTIN) 800 MG tablet Take 1 tablet (800 mg total) by mouth 2 (two) times daily. 10/15/21   Tomi Likens, Adam R, DO  hydrochlorothiazide (HYDRODIURIL) 25 MG tablet TAKE ONE TABLET BY MOUTH DAILY 02/07/22   Lelon Perla, MD  hydroxychloroquine (PLAQUENIL) 200 MG tablet Take 1 tablet (200 mg total) by mouth 2 (two) times daily. 06/26/21   Copland, Gay Filler, MD  losartan (COZAAR) 50 MG tablet TAKE ONE TABLET BY MOUTH EVERY MORNING 02/11/22   Copland, Gay Filler, MD  meclizine (ANTIVERT) 25 MG tablet TAKE ONE TABLET BY MOUTH twice daily AS NEEDED FOR dizziness Patient not taking: Reported on 03/27/2022 10/10/20   Copland, Gay Filler, MD  metFORMIN (GLUCOPHAGE) 500 MG tablet TAKE ONE TABLET BY MOUTH EVERYDAY AT BEDTIME 11/11/21   Copland, Gay Filler, MD  methocarbamol (ROBAXIN) 500 MG tablet Take 1 tablet (500 mg total) by mouth every 8 (eight) hours as needed for muscle spasms. Patient not taking: Reported on 03/27/2022 12/04/21   Copland, Gay Filler, MD  metoprolol succinate (TOPROL-XL) 25 MG 24 hr tablet TAKE ONE TABLET BY MOUTH EVERY MORNING 02/06/22   Copland, Gay Filler, MD  omeprazole (PRILOSEC) 40 MG capsule TAKE ONE CAPSULE BY MOUTH ONCE DAILY 02/06/22   Copland, Gay Filler, MD  ondansetron (ZOFRAN ODT) 8 MG disintegrating tablet Take 1 tablet (8 mg total) by mouth every 8 (eight) hours  as needed for nausea or vomiting. Patient not taking: Reported on 03/27/2022 06/01/21   Molpus, John, MD  OZEMPIC, 0.25 OR 0.5 MG/DOSE, 2 MG/3ML SOPN INJECT 0.5 MG into THE SKIN ONCE A WEEK 02/10/22   Copland, Gay Filler, MD  rosuvastatin (CRESTOR) 20 MG tablet TAKE ONE TABLET BY MOUTH EVERYDAY AT BEDTIME 08/22/21   Copland, Gay Filler, MD  sucralfate (CARAFATE) 1 g tablet Take 1 tablet (1 g total) by mouth 4 (four) times daily -  with meals and at bedtime. As needed 03/27/22   Copland, Gay Filler, MD  traMADol (ULTRAM) 50 MG tablet Take 1 tablet (50 mg total) by mouth every 12 (twelve) hours as needed  for severe pain. 03/27/22   Copland, Gay Filler, MD      Allergies    Codeine    Review of Systems   Review of Systems  Constitutional:  Positive for fever.  HENT:  Negative for congestion and drooling.   Respiratory:  Negative for cough.   Cardiovascular:  Negative for chest pain.  Gastrointestinal:  Positive for nausea. Negative for diarrhea and vomiting.  Genitourinary:  Negative for dysuria.  Musculoskeletal:  Positive for myalgias.  Skin:  Negative for rash.  Psychiatric/Behavioral:  Negative for confusion.     Physical Exam Updated Vital Signs BP (!) 145/67 (BP Location: Left Arm)   Pulse (!) 102   Temp (!) 100.6 F (38.1 C) (Oral)   Resp 16   Ht '5\' 3"'  (1.6 m)   Wt 90.3 kg   SpO2 96%   BMI 35.25 kg/m  Physical Exam Vitals and nursing note reviewed.  Constitutional:      General: She is not in acute distress.    Appearance: Normal appearance. She is well-developed.  HENT:     Head: Normocephalic and atraumatic.     Nose: Nose normal.  Eyes:     Extraocular Movements: Extraocular movements intact.     Pupils: Pupils are equal, round, and reactive to light.  Cardiovascular:     Rate and Rhythm: Normal rate and regular rhythm.     Pulses: Normal pulses.     Heart sounds: Normal heart sounds.  Pulmonary:     Effort: Pulmonary effort is normal. No respiratory distress.     Breath sounds: Normal breath sounds.  Abdominal:     General: Abdomen is flat. Bowel sounds are normal. There is no distension.     Palpations: Abdomen is soft.     Tenderness: There is no abdominal tenderness. There is no guarding or rebound.  Genitourinary:    Vagina: No vaginal discharge.  Musculoskeletal:        General: Normal range of motion.     Cervical back: Neck supple.  Skin:    General: Skin is warm and dry.     Capillary Refill: Capillary refill takes less than 2 seconds.     Findings: No erythema or rash.  Neurological:     General: No focal deficit present.     Mental  Status: She is alert and oriented to person, place, and time.     Deep Tendon Reflexes: Reflexes normal.  Psychiatric:        Mood and Affect: Mood normal.        Behavior: Behavior normal.     ED Results / Procedures / Treatments   Labs (all labs ordered are listed, but only abnormal results are displayed) Labs Reviewed  RESP PANEL BY RT-PCR (FLU A&B, COVID) [id number]    EKG  None  Radiology No results found.  Procedures Procedures    Medications Ordered in ED Medications  acetaminophen (TYLENOL) tablet 1,000 mg (1,000 mg Oral Given 04/11/22 0439)  ondansetron (ZOFRAN-ODT) disintegrating tablet 8 mg (8 mg Oral Given 04/11/22 0439)  ibuprofen (ADVIL) tablet 800 mg (800 mg Oral Given 04/11/22 0439)    ED Course/ Medical Decision Making/ A&P                           Medical Decision Making Here with husband who is also a patient who was told to come back if he felt worse.  She has the same symptoms as her husband   Amount and/or Complexity of Data Reviewed Independent Historian: spouse    Details: See above  External Data Reviewed: notes.    Details: Previous notes reviewed  Labs: ordered.    Details: Negative covid and flu   Risk OTC drugs. Prescription drug management. Risk Details: Well appearing.  I believe given the family has this that it is a viral infection.  Symptoms may last up to 7 days.  Alternate tylenol and ibuprofen.  Follow up with your PMD.  Strict return.      Final Clinical Impression(s) / ED Diagnoses Final diagnoses:  None   Return for intractable cough, coughing up blood, fevers > 100.4 unrelieved by medication, shortness of breath, intractable vomiting, chest pain, shortness of breath, weakness, numbness, changes in speech, facial asymmetry, abdominal pain, passing out, Inability to tolerate liquids or food, cough, altered mental status or any concerns. No signs of systemic illness or infection. The patient is nontoxic-appearing on exam and  vital signs are within normal limits.  I have reviewed the triage vital signs and the nursing notes. Pertinent labs & imaging results that were available during my care of the patient were reviewed by me and considered in my medical decision making (see chart for details). After history, exam, and medical workup I feel the patient has been appropriately medically screened and is safe for discharge home. Pertinent diagnoses were discussed with the patient. Patient was given return precautions.     Lilias Lorensen, MD 04/11/22 [phone]

## 2022-04-11 NOTE — ED Triage Notes (Addendum)
Pt is /co chills, body aches, and fever  Sxs started yesterday  Pt has not taken anything for fever or pain   Pt states she had her covid vaccine on Thursday

## 2022-04-12 ENCOUNTER — Encounter: Payer: Self-pay | Admitting: Family Medicine

## 2022-04-12 DIAGNOSIS — R1013 Epigastric pain: Secondary | ICD-10-CM

## 2022-04-12 MED ORDER — SUCRALFATE 1 G PO TABS: 1.0000 g | ORAL_TABLET | Freq: Three times a day (TID) | ORAL | 1 refills | Status: DC

## 2022-04-12 MED ORDER — ONDANSETRON 8 MG PO TBDP: 8.0000 mg | ORAL_TABLET | Freq: Three times a day (TID) | ORAL | 0 refills | Status: DC | PRN

## 2022-04-19 DIAGNOSIS — E119 Type 2 diabetes mellitus without complications: Secondary | ICD-10-CM

## 2022-04-19 DIAGNOSIS — E785 Hyperlipidemia, unspecified: Secondary | ICD-10-CM

## 2022-04-19 DIAGNOSIS — I10 Essential (primary) hypertension: Secondary | ICD-10-CM

## 2022-04-19 DIAGNOSIS — M059 Rheumatoid arthritis with rheumatoid factor, unspecified: Secondary | ICD-10-CM

## 2022-04-19 DIAGNOSIS — Z7985 Long-term (current) use of injectable non-insulin antidiabetic drugs: Secondary | ICD-10-CM

## 2022-04-27 ENCOUNTER — Other Ambulatory Visit: Payer: Self-pay | Admitting: Family Medicine

## 2022-04-27 DIAGNOSIS — E119 Type 2 diabetes mellitus without complications: Secondary | ICD-10-CM

## 2022-05-05 DIAGNOSIS — M329 Systemic lupus erythematosus, unspecified: Secondary | ICD-10-CM | POA: Diagnosis not present

## 2022-05-06 NOTE — Progress Notes (Unsigned)
[facility] [address] [place], [place] [address] [phone]  Date:  05/07/2022   Name:  Amanda Saunders   DOB:  02-01-1956   MRN:  [id number]  PCP:  Darreld Mclean, MD    Chief Complaint: No chief complaint on file.   History of Present Illness:  Amanda Saunders is a 66 y.o. very pleasant female patient who presents with the following:  Patient seen today with concern of not feeling well- nausea and low grade fevers Most recent visit with myself was in July- history of lupus, rheumatoid arthritis, cardiomyopathy, hypertension, diabetes, stroke January 2020  Dr Tomi Likens is her neurologist Dr Amil Amen rheumatology At her visit in July she mentioned low grade fevers- I touched base with Dr Amil Amen who thought likely de to her autoimmune disease.    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   Systemic lupus erythematosus ([place]) 08/04/2019   Acute ischemic stroke ([place]) 08/14/2018   HTN (hypertension) 08/14/2018   Controlled type 2 diabetes mellitus without complication, without long-term current use of insulin ([place]) 04/03/2016   History of CVA (cerebrovascular accident) 03/12/2016   Chest pain at rest 03/12/2016   Peripheral edema 03/12/2016    Past Medical History:  Diagnosis Date   Arthritis    Cardiomyopathy ([place])    Hypertension    Lupus ([place])    PUD (peptic ulcer disease)    Stroke ([place]) 08/13/2018   Vertigo     Past Surgical History:  Procedure Laterality Date   BACK SURGERY     CARPAL TUNNEL RELEASE     COLONOSCOPY     KNEE CARTILAGE SURGERY     NECK SURGERY     2015   UPPER GASTROINTESTINAL ENDOSCOPY      Social History   Tobacco Use   Smoking status: Former    Types: Cigarettes    Quit date: 07/21/2004     Years since quitting: 17.8   Smokeless tobacco: Never  Vaping Use   Vaping Use: Never used  Substance Use Topics   Alcohol use: Not Currently    Comment: occasional   Drug use: No    Family History  Problem Relation Age of Onset   Hypertension Mother    Arthritis Mother    Heart failure Mother    Stroke Mother    Kidney disease Father    Hypertension Father    Colon cancer Neg Hx    Esophageal cancer Neg Hx    Rectal cancer Neg Hx    Stomach cancer Neg Hx     Allergies  Allergen Reactions   Codeine Nausea And Vomiting and Other (See Comments)    Medication list has been reviewed and updated.  Current Outpatient Medications on File Prior to Visit  Medication Sig Dispense Refill   ACCU-CHEK GUIDE test strip USE UP TO FOUR TIMES DAILY AS DIRECTED 100 strip 0   Accu-Chek Softclix Lancets lancets USE UP TO FOUR TIMES DAILY AS DIRECTED 100 each 0   belimumab (BENLYSTA) 400 MG SOLR injection Inject into the vein every 30 (thirty) days.     Blood Glucose Monitoring Suppl (ACCU-CHEK GUIDE) w/Device KIT USE UP TO FOUR TIMES DAILY AS DIRECTED 1 kit 0  diclofenac Sodium (VOLTAREN) 1 % GEL Apply 4 g topically 4 (four) times daily as needed. 500 g 6   ELIQUIS 5 MG TABS tablet TAKE ONE TABLET BY MOUTH EVERY MORNING and TAKE ONE TABLET BY MOUTH EVERYDAY AT BEDTIME 180 tablet 1   gabapentin (NEURONTIN) 800 MG tablet Take 1 tablet (800 mg total) by mouth 2 (two) times daily. 60 tablet 5   hydrochlorothiazide (HYDRODIURIL) 25 MG tablet TAKE ONE TABLET BY MOUTH DAILY 30 tablet 1   hydroxychloroquine (PLAQUENIL) 200 MG tablet Take 1 tablet (200 mg total) by mouth 2 (two) times daily. 30 tablet 0   losartan (COZAAR) 50 MG tablet TAKE ONE TABLET BY MOUTH EVERY MORNING 90 tablet 0   meclizine (ANTIVERT) 25 MG tablet TAKE ONE TABLET BY MOUTH twice daily AS NEEDED FOR dizziness (Patient not taking: Reported on 03/27/2022) 90 tablet 1   metFORMIN (GLUCOPHAGE) 500 MG tablet TAKE ONE TABLET BY MOUTH  EVERYDAY AT BEDTIME 90 tablet 3   methocarbamol (ROBAXIN) 500 MG tablet Take 1 tablet (500 mg total) by mouth every 8 (eight) hours as needed for muscle spasms. (Patient not taking: Reported on 03/27/2022) 30 tablet 0   metoprolol succinate (TOPROL-XL) 25 MG 24 hr tablet TAKE ONE TABLET BY MOUTH EVERY MORNING 90 tablet 3   omeprazole (PRILOSEC) 40 MG capsule TAKE ONE CAPSULE BY MOUTH ONCE DAILY 90 capsule 1   ondansetron (ZOFRAN ODT) 8 MG disintegrating tablet Take 1 tablet (8 mg total) by mouth every 8 (eight) hours as needed for nausea or vomiting. 30 tablet 0   rosuvastatin (CRESTOR) 20 MG tablet TAKE ONE TABLET BY MOUTH EVERYDAY AT BEDTIME 90 tablet 3   Semaglutide,0.25 or 0.5MG/DOS, (OZEMPIC, 0.25 OR 0.5 MG/DOSE,) 2 MG/3ML SOPN Inject 0.5 mg into the skin once a week. 3 mL 1   sucralfate (CARAFATE) 1 g tablet Take 1 tablet (1 g total) by mouth 4 (four) times daily -  with meals and at bedtime. As needed 40 tablet 1   traMADol (ULTRAM) 50 MG tablet Take 1 tablet (50 mg total) by mouth every 12 (twelve) hours as needed for severe pain. 30 tablet 0   No current facility-administered medications on file prior to visit.    Review of Systems:  As per HPI- otherwise negative.   Physical Examination: There were no vitals filed for this visit. There were no vitals filed for this visit. There is no height or weight on file to calculate BMI. Ideal Body Weight:    GEN: no acute distress. HEENT: Atraumatic, Normocephalic.  Ears and Nose: No external deformity. CV: RRR, No M/G/R. No JVD. No thrill. No extra heart sounds. PULM: CTA B, no wheezes, crackles, rhonchi. No retractions. No resp. distress. No accessory muscle use. ABD: S, NT, ND, +BS. No rebound. No HSM. EXTR: No c/c/e PSYCH: Normally interactive. Conversant.    Assessment and Plan: ***  Signed Lamar Blinks, MD

## 2022-05-07 ENCOUNTER — Ambulatory Visit (HOSPITAL_BASED_OUTPATIENT_CLINIC_OR_DEPARTMENT_OTHER): Payer: Medicare Other

## 2022-05-07 ENCOUNTER — Ambulatory Visit (INDEPENDENT_AMBULATORY_CARE_PROVIDER_SITE_OTHER): Payer: Medicare Other | Admitting: Family Medicine

## 2022-05-07 VITALS — BP 118/64 | HR 74 | Temp 97.6°F | Resp 18 | Wt 188.2 lb

## 2022-05-07 DIAGNOSIS — M329 Systemic lupus erythematosus, unspecified: Secondary | ICD-10-CM

## 2022-05-07 DIAGNOSIS — I10 Essential (primary) hypertension: Secondary | ICD-10-CM | POA: Diagnosis not present

## 2022-05-07 DIAGNOSIS — R112 Nausea with vomiting, unspecified: Secondary | ICD-10-CM | POA: Diagnosis not present

## 2022-05-07 DIAGNOSIS — R350 Frequency of micturition: Secondary | ICD-10-CM | POA: Diagnosis not present

## 2022-05-07 DIAGNOSIS — E119 Type 2 diabetes mellitus without complications: Secondary | ICD-10-CM

## 2022-05-07 LAB — CBC
HCT: 37.8 % (ref 36.0–46.0)
Hemoglobin: 12.4 g/dL (ref 12.0–15.0)
MCHC: 32.7 g/dL (ref 30.0–36.0)
MCV: 83.3 fl (ref 78.0–100.0)
Platelets: 219 10*3/uL (ref 150.0–400.0)
RBC: 4.55 Mil/uL (ref 3.87–5.11)
RDW: 15.9 % — ABNORMAL HIGH (ref 11.5–15.5)
WBC: 5.5 10*3/uL (ref 4.0–10.5)

## 2022-05-07 LAB — POCT URINALYSIS DIP (MANUAL ENTRY)
Blood, UA: NEGATIVE
Glucose, UA: NEGATIVE mg/dL
Ketones, POC UA: NEGATIVE mg/dL
Leukocytes, UA: NEGATIVE
Nitrite, UA: NEGATIVE
Spec Grav, UA: 1.025 (ref 1.010–1.025)
Urobilinogen, UA: 0.2 E.U./dL
pH, UA: 6 (ref 5.0–8.0)

## 2022-05-07 LAB — TROPONIN I (HIGH SENSITIVITY): High Sens Troponin I: 4 ng/L (ref 2–17)

## 2022-05-07 LAB — COMPREHENSIVE METABOLIC PANEL
ALT: 20 U/L (ref 0–35)
AST: 22 U/L (ref 0–37)
Albumin: 4.3 g/dL (ref 3.5–5.2)
Alkaline Phosphatase: 77 U/L (ref 39–117)
BUN: 12 mg/dL (ref 6–23)
CO2: 31 mEq/L (ref 19–32)
Calcium: 9.2 mg/dL (ref 8.4–10.5)
Chloride: 103 mEq/L (ref 96–112)
Creatinine, Ser: 0.84 mg/dL (ref 0.40–1.20)
GFR: 73.07 mL/min (ref >60.00)
Glucose, Bld: 94 mg/dL (ref 70–99)
Potassium: 3.5 mEq/L (ref 3.5–5.1)
Sodium: 140 mEq/L (ref 135–145)
Total Bilirubin: 0.3 mg/dL (ref 0.2–1.2)
Total Protein: 7.2 g/dL (ref 6.0–8.3)

## 2022-05-07 LAB — HEMOGLOBIN A1C: Hgb A1c MFr Bld: 6.5 % (ref 4.6–6.5)

## 2022-05-07 LAB — MICROALBUMIN / CREATININE URINE RATIO
Creatinine,U: 239.6 mg/dL
Microalb Creat Ratio: 1.1 mg/g (ref 0.0–30.0)
Microalb, Ur: 2.6 mg/dL — ABNORMAL HIGH (ref 0.0–1.9)

## 2022-05-07 LAB — LIPASE: Lipase: 24 U/L (ref 11.0–59.0)

## 2022-05-07 NOTE — Patient Instructions (Signed)
I will be in touch with your labs and ultrasound asap Ultrasound at 10 am here at the [facility] by mouth until then!

## 2022-05-08 ENCOUNTER — Ambulatory Visit (HOSPITAL_BASED_OUTPATIENT_CLINIC_OR_DEPARTMENT_OTHER)
Admission: RE | Admit: 2022-05-08 | Discharge: 2022-05-08 | Disposition: A | Discharge status: Home / Self Care | Payer: Medicare Other | Source: Ambulatory Visit | Attending: Family Medicine | Admitting: Family Medicine

## 2022-05-08 ENCOUNTER — Encounter: Payer: Self-pay | Admitting: Family Medicine

## 2022-05-08 ENCOUNTER — Other Ambulatory Visit: Payer: Self-pay | Admitting: Family Medicine

## 2022-05-08 DIAGNOSIS — R1031 Right lower quadrant pain: Secondary | ICD-10-CM

## 2022-05-08 DIAGNOSIS — R112 Nausea with vomiting, unspecified: Secondary | ICD-10-CM | POA: Insufficient documentation

## 2022-05-08 DIAGNOSIS — R1013 Epigastric pain: Secondary | ICD-10-CM | POA: Diagnosis not present

## 2022-05-08 DIAGNOSIS — R079 Chest pain, unspecified: Secondary | ICD-10-CM | POA: Diagnosis not present

## 2022-05-08 DIAGNOSIS — R1011 Right upper quadrant pain: Secondary | ICD-10-CM | POA: Diagnosis not present

## 2022-05-08 LAB — URINE CULTURE
MICRO NUMBER:: 14067695
SPECIMEN QUALITY:: ADEQUATE

## 2022-05-08 MED ORDER — IOHEXOL 300 MG/ML  SOLN
100.0000 mL | Freq: Once | INTRAMUSCULAR | Status: AC | PRN
  Administered 2022-05-08: 100 mL via INTRAVENOUS

## 2022-05-08 NOTE — Progress Notes (Signed)
Received her [place]- negative  US Abdomen Limited RUQ (LIVER/GB)  Result Date: 05/08/2022 CLINICAL DATA:  Nausea and vomiting EXAM: ULTRASOUND ABDOMEN LIMITED RIGHT UPPER QUADRANT COMPARISON:  None Available. FINDINGS: Gallbladder: No gallstones or wall thickening visualized. No sonographic Murphy sign noted by sonographer. Common bile duct: Diameter: 3 mm Liver: Increased echogenicity. No focal lesion. Portal vein is patent on color Doppler imaging with normal direction of blood flow towards the liver. Other: None. IMPRESSION: 1. Increased hepatic parenchymal echogenicity suggestive of steatosis. 2. No cholelithiasis or sonographic evidence for acute cholecystitis. Electronically Signed   By: Lovey Newcomer M.D.   On: 05/08/2022 09:43   Will set up a CT scan asap  Called pt and let her know

## 2022-05-09 ENCOUNTER — Encounter: Payer: Self-pay | Admitting: Family Medicine

## 2022-05-10 ENCOUNTER — Emergency Department (HOSPITAL_BASED_OUTPATIENT_CLINIC_OR_DEPARTMENT_OTHER)
Admission: EM | Admit: 2022-05-10 | Discharge: 2022-05-10 | Discharge status: Against Medical Advice | Payer: Medicare Other

## 2022-05-10 ENCOUNTER — Encounter (HOSPITAL_COMMUNITY): Payer: Self-pay | Admitting: Emergency Medicine

## 2022-05-10 ENCOUNTER — Emergency Department (HOSPITAL_COMMUNITY): Payer: Medicare Other

## 2022-05-10 ENCOUNTER — Emergency Department (HOSPITAL_COMMUNITY)
Admission: EM | Admit: 2022-05-10 | Discharge: 2022-05-11 | Disposition: A | Discharge status: Home / Self Care | Payer: Medicare Other | Attending: Emergency Medicine | Admitting: Emergency Medicine

## 2022-05-10 ENCOUNTER — Other Ambulatory Visit: Payer: Self-pay

## 2022-05-10 DIAGNOSIS — M10072 Idiopathic gout, left ankle and foot: Secondary | ICD-10-CM | POA: Diagnosis not present

## 2022-05-10 DIAGNOSIS — M109 Gout, unspecified: Secondary | ICD-10-CM | POA: Diagnosis not present

## 2022-05-10 DIAGNOSIS — M79675 Pain in left toe(s): Secondary | ICD-10-CM | POA: Diagnosis present

## 2022-05-10 DIAGNOSIS — M79672 Pain in left foot: Secondary | ICD-10-CM | POA: Diagnosis not present

## 2022-05-10 NOTE — ED Triage Notes (Signed)
Pt reports L medial foot pain- radiates in to the L great toe. Denies injury. Pain worsens with walking.

## 2022-05-10 NOTE — ED Provider Triage Note (Signed)
Emergency Medicine Provider Triage Evaluation Note  Amanda Saunders , a 66 y.o. female  was evaluated in triage.  Pt complains of left great toe/foot pain starting yesterday. No injury. Otherwise feeling well. Noticed some more ankle swelling today as well  Review of Systems  Positive: Left foot pain Negative: Numbness, fever  Physical Exam  BP 131/73   Pulse 78   Temp 98.2 F (36.8 C) (Oral)   Resp 18   SpO2 97%  Gen:   Awake, no distress   Resp:  Normal effort  MSK:   Moves extremities without difficulty  Other:  Erythema, tenderness, and increased warmth over the left 1st toe MTP  Medical Decision Making  Medically screening exam initiated at 9:23 PM.  Appropriate orders placed.  Amanda Saunders was informed that the remainder of the evaluation will be completed by another provider, this initial triage assessment does not replace that evaluation, and the importance of remaining in the ED until their evaluation is complete.  Suspicious for gout. Hx of lupus. Will obtain x-ray to evaluate joint space   Prescott Truex T, PA-C 05/10/22 2124

## 2022-05-11 MED ORDER — COLCHICINE 0.6 MG PO TABS: 0.6000 mg | ORAL_TABLET | Freq: Once | ORAL | Status: DC

## 2022-05-11 MED ORDER — PREDNISONE 20 MG PO TABS: 40.0000 mg | ORAL_TABLET | Freq: Every day | ORAL | 0 refills | Status: DC

## 2022-05-11 MED ORDER — PREDNISONE 20 MG PO TABS
40.0000 mg | ORAL_TABLET | Freq: Once | ORAL | Status: AC
  Administered 2022-05-11: 40 mg via ORAL
  Filled 2022-05-11: qty 2

## 2022-05-11 MED ORDER — COLCHICINE 0.6 MG PO TABS: 0.6000 mg | ORAL_TABLET | Freq: Two times a day (BID) | ORAL | 0 refills | Status: DC

## 2022-05-11 MED ORDER — COLCHICINE 0.6 MG PO TABS
1.2000 mg | ORAL_TABLET | Freq: Once | ORAL | Status: AC
  Administered 2022-05-11: 1.2 mg via ORAL
  Filled 2022-05-11: qty 2

## 2022-05-11 NOTE — ED Provider Notes (Signed)
[facility] Emergency Department Provider Note MRN:  [id number]  Arrival date & time: 05/11/22     Chief Complaint   Foot Pain   History of Present Illness   Amanda Saunders is a 66 y.o. year-old female presents to the ED with chief complaint of left great toe pain.  She states the pain started 2 days ago.  She denies any trauma or injury.  She denies history of gout.  Denies fevers or chills.  States that the toe pain is worsened with movement and palpation.  History provided by patient.   Review of Systems  Pertinent positive and negative review of systems noted in HPI.    Physical Exam   Vitals:   05/10/22 2036  BP: 131/73  Pulse: 78  Resp: 18  Temp: 98.2 F (36.8 C)  SpO2: 97%    CONSTITUTIONAL:  well-appearing, NAD NEURO:  Alert and oriented x 3, CN 3-12 grossly intact EYES:  eyes equal and reactive ENT/NECK:  Supple, no stridor  CARDIO:  appears well-perfused  PULM:  No respiratory distress,  GI/GU:  non-distended,  MSK/SPINE:  No gross deformities, no edema, moves all extremities, mild warmth and erythema around the left great toe, tender to palpation, no visible deformity SKIN:  no rash, atraumatic   *Additional and/or pertinent findings included in MDM below  Diagnostic and Interventional Summary     Labs Reviewed - No data to display  DG Foot Complete Left  Final Result      Medications  colchicine tablet 1.2 mg (has no administration in time range)  predniSONE (DELTASONE) tablet 40 mg (has no administration in time range)     Procedures  /  Critical Care Procedures  ED Course and Medical Decision Making  I have reviewed the triage vital signs, the nursing notes, and pertinent available records from the EMR.  Social Determinants Affecting Complexity of Care: Patient has no clinically significant social determinants affecting this chief complaint..   ED Course:    Medical Decision Making Patient here with  left great toe pain.  Symptoms seem consistent with gout.  She does not have a history of gout.  However, there is no evidence of infection.  She denies trauma.  Plain films are negative.  We will start colchicine and prednisone.  Patient does not want any strong pain medicine and is allergic to codeine.  I did offer additional pain medicine, but patient declined.  I have requested that the patient hold her rosuvastatin while taking the colchicine.  I discussed this with pharmacy, who is also in agreement.  Problems Addressed: Acute gout involving toe of left foot, unspecified cause: acute illness or injury  Amount and/or Complexity of Data Reviewed Radiology: independent interpretation performed.    Details: No fracture  Risk OTC drugs. Prescription drug management.     Consultants: No consultations were needed in caring for this patient.   Treatment and Plan: Emergency department workup does not suggest an emergent condition requiring admission or immediate intervention beyond  what has been performed at this time. The patient is safe for discharge and has  been instructed to return immediately for worsening symptoms, change in  symptoms or any other concerns    Final Clinical Impressions(s) / ED Diagnoses     ICD-10-CM   1. Acute gout involving toe of left foot, unspecified cause  M10.9       ED Discharge Orders          Ordered  colchicine 0.6 MG tablet  2 times daily        05/11/22 0234    predniSONE (DELTASONE) 20 MG tablet  Daily        05/11/22 0234              Discharge Instructions Discussed with and Provided to Patient:   Discharge Instructions      HOLD your Crestor while taking the Colchicine.       Montine Circle, PA-C 01/65/80 0634    Delora Fuel, MD 94/94/47 [phone]

## 2022-05-11 NOTE — Discharge Instructions (Signed)
HOLD your Crestor while taking the Colchicine.

## 2022-05-12 ENCOUNTER — Ambulatory Visit: Payer: Medicare Other | Admitting: Family Medicine

## 2022-05-13 ENCOUNTER — Ambulatory Visit (INDEPENDENT_AMBULATORY_CARE_PROVIDER_SITE_OTHER): Payer: Medicare Other | Admitting: Family

## 2022-05-13 ENCOUNTER — Other Ambulatory Visit: Payer: Self-pay | Admitting: Family

## 2022-05-13 ENCOUNTER — Encounter: Payer: Self-pay | Admitting: Family

## 2022-05-13 VITALS — BP 118/60 | HR 63 | Temp 97.9°F | Ht 63.0 in | Wt 187.8 lb

## 2022-05-13 DIAGNOSIS — M109 Gout, unspecified: Secondary | ICD-10-CM

## 2022-05-13 MED ORDER — PREDNISONE 20 MG PO TABS: 20.0000 mg | ORAL_TABLET | Freq: Every day | ORAL | 0 refills | Status: DC

## 2022-05-13 NOTE — Progress Notes (Signed)
Amanda Saunders is a 66 y.o. 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   Systemic lupus erythematosus ([place]) 08/04/2019   Acute ischemic stroke ([place]) 08/14/2018   HTN (hypertension) 08/14/2018   Controlled type 2 diabetes mellitus without complication, without long-term current use of insulin ([place]) 04/03/2016   History of CVA (cerebrovascular accident) 03/12/2016   Chest pain at rest 03/12/2016   Peripheral edema 03/12/2016    Current Outpatient Medications  Medication Sig Dispense Refill   ACCU-CHEK GUIDE test strip USE UP TO FOUR TIMES DAILY AS DIRECTED 100 strip 0   Accu-Chek Softclix Lancets lancets USE UP TO FOUR TIMES DAILY AS DIRECTED 100 each 0   belimumab (BENLYSTA) 400 MG SOLR injection Inject into the vein every 30 (thirty) days.     Blood Glucose Monitoring Suppl (ACCU-CHEK GUIDE) w/Device KIT USE UP TO FOUR TIMES DAILY AS DIRECTED 1 kit 0   colchicine 0.6 MG tablet Take 1 tablet (0.6 mg total) by mouth 2 (two) times daily. 8 tablet 0   diclofenac Sodium (VOLTAREN) 1 % GEL Apply 4 g topically 4 (four) times daily as needed. 500 g 6   ELIQUIS 5 MG TABS tablet TAKE ONE TABLET BY MOUTH EVERY MORNING and TAKE ONE TABLET BY MOUTH EVERYDAY AT BEDTIME 180 tablet 1   gabapentin (NEURONTIN) 800 MG tablet Take 1 tablet (800 mg total) by mouth 2 (two) times daily. 60 tablet 5   hydrochlorothiazide (HYDRODIURIL) 25 MG tablet TAKE ONE TABLET BY MOUTH DAILY 30 tablet 1   hydroxychloroquine (PLAQUENIL) 200 MG tablet Take 1 tablet (200 mg total) by mouth 2 (two) times daily. 30 tablet 0   losartan (COZAAR) 50 MG tablet TAKE ONE TABLET BY MOUTH EVERY MORNING 90 tablet 0   metFORMIN (GLUCOPHAGE) 500 MG tablet TAKE ONE TABLET BY MOUTH EVERYDAY AT BEDTIME 90 tablet 3   metoprolol succinate (TOPROL-XL) 25 MG 24 hr tablet TAKE ONE TABLET BY MOUTH EVERY MORNING 90 tablet 3    omeprazole (PRILOSEC) 40 MG capsule TAKE ONE CAPSULE BY MOUTH ONCE DAILY 90 capsule 1   ondansetron (ZOFRAN ODT) 8 MG disintegrating tablet Take 1 tablet (8 mg total) by mouth every 8 (eight) hours as needed for nausea or vomiting. 30 tablet 0   predniSONE (DELTASONE) 20 MG tablet Take 2 tablets (40 mg total) by mouth daily. 10 tablet 0   predniSONE (DELTASONE) 20 MG tablet Take 1 tablet (20 mg total) by mouth daily with breakfast. 5 tablet 0   Semaglutide,0.25 or 0.5MG/DOS, (OZEMPIC, 0.25 OR 0.5 MG/DOSE,) 2 MG/3ML SOPN Inject 0.5 mg into the skin once a week. 3 mL 1   sucralfate (CARAFATE) 1 g tablet Take 1 tablet (1 g total) by mouth 4 (four) times daily -  with meals and at bedtime. As needed 40 tablet 1   traMADol (ULTRAM) 50 MG tablet Take 1 tablet (50 mg total) by mouth every 12 (twelve) hours as needed for severe pain. 30 tablet 0   meclizine (ANTIVERT) 25 MG tablet TAKE ONE TABLET BY MOUTH twice daily AS NEEDED FOR dizziness (Patient not taking: Reported on 03/27/2022) 90 tablet 1   methocarbamol (ROBAXIN) 500 MG tablet Take 1 tablet (500 mg total) by mouth every 8 (eight) hours as needed for muscle spasms. (Patient not taking: Reported on 03/27/2022) 30 tablet 0   rosuvastatin (CRESTOR) 20 MG tablet TAKE ONE TABLET BY MOUTH EVERYDAY AT  BEDTIME (Patient not taking: Reported on 05/13/2022) 90 tablet 3   No current facility-administered medications for this visit.    Allergies: Codeine  Past Medical History:  Diagnosis Date   Arthritis    Cardiomyopathy ([place])    Hypertension    Lupus ([place])    PUD (peptic ulcer disease)    Stroke ([place]) 08/13/2018   Vertigo     Past Surgical History:  Procedure Laterality Date   BACK SURGERY     CARPAL TUNNEL RELEASE     COLONOSCOPY     KNEE CARTILAGE SURGERY     NECK SURGERY     2015   UPPER GASTROINTESTINAL ENDOSCOPY      Family History  Problem Relation Age of Onset   Hypertension Mother    Arthritis Mother    Heart failure Mother    Stroke  Mother    Kidney disease Father    Hypertension Father    Colon cancer Neg Hx    Esophageal cancer Neg Hx    Rectal cancer Neg Hx    Stomach cancer Neg Hx     Social History   Tobacco Use   Smoking status: Former    Types: Cigarettes    Quit date: 07/21/2004    Years since quitting: 17.8   Smokeless tobacco: Never  Substance Use Topics   Alcohol use: Not Currently    Comment: occasional    Subjective:   Seen at ER on 05/10/2022 and was diagnosed with gout flare; per patient, she has never had gout issues prior; is on day 3 of prednisone taper ( 40 mg)- with some improvement; requesting work note for remainder of the week;      Objective:  Vitals:   05/13/22 0951  BP: 118/60  Pulse: 63  Temp: 97.9 F (36.6 C)  TempSrc: Oral  SpO2: 98%  Weight: 187 lb 12.8 oz (85.2 kg)  Height: '5\' 3"'  (1.6 m)    General: Well developed, well nourished, in no acute distress  Skin : Warm and dry.  Head: Normocephalic and atraumatic  Lungs: Respirations unlabored;  Musculoskeletal: No deformities; inflammation noted over base of 1st toe Extremities: No edema, cyanosis, clubbing  Vessels: Symmetric bilaterally  Neurologic: Alert and oriented; speech intact; face symmetrical; moves all extremities well; CNII-XII intact without focal deficit   Assessment:  1. Acute gout of left foot, unspecified cause     Plan:  Reassurance; education regarding need to stay hydrated; will extend prednisone 20 mg x 5 days; she will plan to see her PCP in follow up in 2-3 weeks ( in not acute state) to re-check uric acid level/ discuss preventive options;   Return in about 2 weeks (around 05/27/2022) for with Dr. Lorelei Pont.  No orders of the defined types were placed in this encounter.   Requested Prescriptions   Signed Prescriptions Disp Refills   predniSONE (DELTASONE) 20 MG tablet 5 tablet 0    Sig: Take 1 tablet (20 mg total) by mouth daily with breakfast.

## 2022-05-13 NOTE — Patient Instructions (Signed)
Gout  Gout is painful swelling of your joints. Gout is a type of arthritis. It is caused by having too much uric acid in your body. Uric acid is a chemical that is made when your body breaks down substances called purines. If your body has too much uric acid, sharp crystals can form and build up in your joints. This causes pain and swelling. Gout attacks can happen quickly and be very painful (acute gout). Over time, the attacks can affect more joints and happen more often (chronic gout). What are the causes? Gout is caused by too much uric acid in your blood. This can happen because: Your kidneys do not remove enough uric acid from your blood. Your body makes too much uric acid. You eat too many foods that are high in purines. These foods include organ meats, some seafood, and beer. Trauma or stress can bring on an attack. What increases the risk? Having a family history of gout. Being female and middle-aged. Being female and having gone through menopause. Having an organ transplant. Taking certain medicines. Having certain conditions, such as: Being very overweight (obese). Lead poisoning. Kidney disease. A skin condition called psoriasis. Other risks include: Losing weight too quickly. Not having enough water in the body (being dehydrated). Drinking alcohol, especially beer. Drinking beverages that are sweetened with a type of sugar called fructose. What are the signs or symptoms? An attack of acute gout often starts at night and usually happens in just one joint. The most common place is the big toe. Other joints that may be affected include joints of the feet, ankle, knee, fingers, wrist, or elbow. Symptoms may include: Very bad pain. Warmth. Swelling. Stiffness. Tenderness. The affected joint may be very painful to touch. Shiny, red, or purple skin. Chills and fever. Chronic gout may cause symptoms more often. More joints may be involved. You may also have white or yellow lumps  (tophi) on your hands or feet or in other areas near your joints. How is this treated? Treatment for an acute attack may include medicines for pain and swelling, such as: NSAIDs, such as ibuprofen. Steroids taken by mouth or injected into a joint. Colchicine. This can be given by mouth or through an IV tube. Treatment to prevent future attacks may include: Taking small doses of NSAIDs or colchicine daily. Using a medicine that reduces uric acid levels in your blood, such as allopurinol. Making changes to your diet. You may need to see a food expert (dietitian) about what to eat and drink to prevent gout. Follow these instructions at home: During a gout attack  If told, put ice on the painful area. To do this: Put ice in a plastic bag. Place a towel between your skin and the bag. Leave the ice on for 20 minutes, 2-3 times a day. Take off the ice if your skin turns bright red. This is very important. If you cannot feel pain, heat, or cold, you have a greater risk of damage to the area. Raise the painful joint above the level of your heart as often as you can. Rest the joint as much as possible. If the joint is in your leg, you may be given crutches. Follow instructions from your doctor about what you cannot eat or drink. Avoiding future gout attacks Eat a low-purine diet. Avoid foods and drinks such as: Liver. Kidney. Anchovies. Asparagus. Herring. Mushrooms. Mussels. Beer. Stay at a healthy weight. If you want to lose weight, talk with your doctor. Do not   lose weight too fast. Start or continue an exercise plan as told by your doctor. Eating and drinking Avoid drinks sweetened by fructose. Drink enough fluids to keep your pee (urine) pale yellow. If you drink alcohol: Limit how much you have to: 0-1 drink a day for women who are not pregnant. 0-2 drinks a day for men. Know how much alcohol is in a drink. In the [place] one drink equals one 12 oz bottle of beer (355 mL), one 5 oz  glass of wine (148 mL), or one 1 oz glass of hard liquor (44 mL). General instructions Take over-the-counter and prescription medicines only as told by your doctor. Ask your doctor if you should avoid driving or using machines while you are taking your medicine. Return to your normal activities when your doctor says that it is safe. Keep all follow-up visits. Where to find more information National Institutes of Health: www.niams.nih.gov Contact a doctor if: You have another gout attack. You still have symptoms of a gout attack after 10 days of treatment. You have problems (side effects) because of your medicines. You have chills or a fever. You have burning pain when you pee (urinate). You have pain in your lower back or belly. Get help right away if: You have very bad pain. Your pain cannot be controlled. You cannot pee. Summary Gout is painful swelling of the joints. The most common site of pain is the big toe, but it can affect other joints. Medicines and avoiding some foods can help to prevent and treat gout attacks. This information is not intended to replace advice given to you by your health care provider. Make sure you discuss any questions you have with your health care provider. Document Revised: 04/10/2021 Document Reviewed: 04/10/2021 Elsevier Patient Education  2023 Elsevier Inc.  

## 2022-05-14 ENCOUNTER — Other Ambulatory Visit: Payer: Self-pay | Admitting: Neurology

## 2022-05-14 ENCOUNTER — Other Ambulatory Visit: Payer: Self-pay | Admitting: Cardiology

## 2022-05-14 ENCOUNTER — Other Ambulatory Visit: Payer: Self-pay | Admitting: Family Medicine

## 2022-05-14 DIAGNOSIS — R0609 Other forms of dyspnea: Secondary | ICD-10-CM

## 2022-05-14 DIAGNOSIS — I428 Other cardiomyopathies: Secondary | ICD-10-CM

## 2022-05-14 NOTE — Progress Notes (Deleted)
[facility] at [facility] [address], [place], [place] [address] [phone]  Date:  05/21/2022   Name:  Amanda Saunders   DOB:  1955-12-01   MRN:  [id number]  PCP:  Darreld Mclean, MD    Chief Complaint: No chief complaint on file.   History of Present Illness:  Amanda Saunders is a 66 y.o. very pleasant female patient who presents with the following:  Patient seen today for short-term follow-up Most recent visit with myself was on October 18-notes from that visit:  Patient seen today with concern of not feeling well- nausea and low grade fevers for about 6 weeks Most recent visit with myself was in July- history of lupus, rheumatoid arthritis, cardiomyopathy, hypertension, diabetes, stroke January 2020 She was seen in the ER on 9/22 with illness- thought to be a viral URI  Negative for flu and covid at that time  Notes she is having nausea after eating, sometimes vomiting, and she may feel like food is "sitting at the top of my stomach" and not digesting normally  She may vomit every couple of weeks Diarrhea once a week or so      Wt Readings from Last 3 Encounters:  05/07/22 188 lb 3.2 oz (85.4 kg)  04/11/22 199 lb (90.3 kg)  02/13/22 193 lb (87.5 kg)    Her highest temperatures are running to the 99s Her low grade temps seem to be worse at night  She is not having abd pain, but "just feels queasy"  No chest pain She is seeing Dr Amil Amen on Monday  No medication changes   We obtained work-up including right upper quadrant ultrasound, CT abdomen pelvis, lab work CMP, CBC, lipase, urine, troponin all looked okay Right upper quadrant ultrasound, CT abdomen pelvis negative I got in touch with GI and she has an appointment on November 17 for follow-up  In the meantime she was seen at the [facility] long emergency department on 10/21 with concern of gout to the left great toe She was treated with colchicine and  prednisone She followed up with my partner Jodi Mourning on 10/24-noted improvement of her toe pain, needed a note for work  Flu shot Recommend COVID, shingles Can offer DEXA scan Patient Active Problem List   Diagnosis Date Noted   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   Systemic lupus erythematosus ([place]) 08/04/2019   Acute ischemic stroke ([place]) 08/14/2018   HTN (hypertension) 08/14/2018   Controlled type 2 diabetes mellitus without complication, without long-term current use of insulin ([place]) 04/03/2016   History of CVA (cerebrovascular accident) 03/12/2016   Chest pain at rest 03/12/2016   Peripheral edema 03/12/2016    Past Medical History:  Diagnosis Date   Arthritis    Cardiomyopathy ([place])    Hypertension    Lupus ([place])    PUD (peptic ulcer disease)    Stroke ([place]) 08/13/2018   Vertigo     Past Surgical History:  Procedure Laterality Date   BACK SURGERY     CARPAL TUNNEL RELEASE     COLONOSCOPY     KNEE CARTILAGE SURGERY     NECK SURGERY     2015   UPPER GASTROINTESTINAL ENDOSCOPY      Social History   Tobacco Use   Smoking status: Former    Types: Cigarettes    Quit date: 07/21/2004    Years since quitting: 17.8   Smokeless tobacco: Never  Vaping Use  Vaping Use: Never used  Substance Use Topics   Alcohol use: Not Currently    Comment: occasional   Drug use: No    Family History  Problem Relation Age of Onset   Hypertension Mother    Arthritis Mother    Heart failure Mother    Stroke Mother    Kidney disease Father    Hypertension Father    Colon cancer Neg Hx    Esophageal cancer Neg Hx    Rectal cancer Neg Hx    Stomach cancer Neg Hx     Allergies  Allergen Reactions   Codeine Nausea And Vomiting and Other (See Comments)    Medication list has been reviewed and updated.  Current Outpatient Medications on File Prior to Visit  Medication Sig Dispense Refill   ACCU-CHEK GUIDE test strip USE UP TO FOUR TIMES DAILY AS DIRECTED  100 strip 0   Accu-Chek Softclix Lancets lancets USE UP TO FOUR TIMES DAILY AS DIRECTED 100 each 0   belimumab (BENLYSTA) 400 MG SOLR injection Inject into the vein every 30 (thirty) days.     Blood Glucose Monitoring Suppl (ACCU-CHEK GUIDE) w/Device KIT USE UP TO FOUR TIMES DAILY AS DIRECTED 1 kit 0   colchicine 0.6 MG tablet Take 1 tablet (0.6 mg total) by mouth 2 (two) times daily. 8 tablet 0   diclofenac Sodium (VOLTAREN) 1 % GEL Apply 4 g topically 4 (four) times daily as needed. 500 g 6   ELIQUIS 5 MG TABS tablet TAKE ONE TABLET BY MOUTH EVERY MORNING and TAKE ONE TABLET BY MOUTH EVERYDAY AT BEDTIME 180 tablet 1   gabapentin (NEURONTIN) 800 MG tablet Take 1 tablet (800 mg total) by mouth 2 (two) times daily. 60 tablet 5   hydrochlorothiazide (HYDRODIURIL) 25 MG tablet TAKE ONE TABLET BY MOUTH DAILY 30 tablet 1   hydroxychloroquine (PLAQUENIL) 200 MG tablet Take 1 tablet (200 mg total) by mouth 2 (two) times daily. 30 tablet 0   losartan (COZAAR) 50 MG tablet TAKE ONE TABLET BY MOUTH EVERY MORNING 90 tablet 0   meclizine (ANTIVERT) 25 MG tablet TAKE ONE TABLET BY MOUTH twice daily AS NEEDED FOR dizziness (Patient not taking: Reported on 03/27/2022) 90 tablet 1   metFORMIN (GLUCOPHAGE) 500 MG tablet TAKE ONE TABLET BY MOUTH EVERYDAY AT BEDTIME 90 tablet 3   methocarbamol (ROBAXIN) 500 MG tablet Take 1 tablet (500 mg total) by mouth every 8 (eight) hours as needed for muscle spasms. (Patient not taking: Reported on 03/27/2022) 30 tablet 0   metoprolol succinate (TOPROL-XL) 25 MG 24 hr tablet TAKE ONE TABLET BY MOUTH EVERY MORNING 90 tablet 3   omeprazole (PRILOSEC) 40 MG capsule TAKE ONE CAPSULE BY MOUTH ONCE DAILY 90 capsule 1   ondansetron (ZOFRAN ODT) 8 MG disintegrating tablet Take 1 tablet (8 mg total) by mouth every 8 (eight) hours as needed for nausea or vomiting. 30 tablet 0   predniSONE (DELTASONE) 20 MG tablet Take 2 tablets (40 mg total) by mouth daily. 10 tablet 0   predniSONE  (DELTASONE) 20 MG tablet Take 1 tablet (20 mg total) by mouth daily with breakfast. 5 tablet 0   rosuvastatin (CRESTOR) 20 MG tablet TAKE ONE TABLET BY MOUTH EVERYDAY AT BEDTIME (Patient not taking: Reported on 05/13/2022) 90 tablet 3   Semaglutide,0.25 or 0.5MG/DOS, (OZEMPIC, 0.25 OR 0.5 MG/DOSE,) 2 MG/3ML SOPN Inject 0.5 mg into the skin once a week. 3 mL 1   sucralfate (CARAFATE) 1 g tablet Take 1 tablet (  1 g total) by mouth 4 (four) times daily -  with meals and at bedtime. As needed 40 tablet 1   traMADol (ULTRAM) 50 MG tablet Take 1 tablet (50 mg total) by mouth every 12 (twelve) hours as needed for severe pain. 30 tablet 0   No current facility-administered medications on file prior to visit.    Review of Systems:  As per HPI- otherwise negative.   Physical Examination: There were no vitals filed for this visit. There were no vitals filed for this visit. There is no height or weight on file to calculate BMI. Ideal Body Weight:    GEN: no acute distress. HEENT: Atraumatic, Normocephalic.  Ears and Nose: No external deformity. CV: RRR, No M/G/R. No JVD. No thrill. No extra heart sounds. PULM: CTA B, no wheezes, crackles, rhonchi. No retractions. No resp. distress. No accessory muscle use. ABD: S, NT, ND, +BS. No rebound. No HSM. EXTR: No c/c/e PSYCH: Normally interactive. Conversant.    Assessment and Plan: ***  Signed Lamar Blinks, MD

## 2022-05-14 NOTE — Patient Instructions (Incomplete)
It was good to see you again today!   Recommend the latest COVID booster, shingles vaccine series if not done already

## 2022-05-16 ENCOUNTER — Telehealth: Payer: Self-pay

## 2022-05-16 NOTE — Telephone Encounter (Signed)
     Patient  visit on 10/22/  at [facility] you been able to follow up with your primary care physician? yes  The patient was or was not able to obtain any needed medicine or equipment. yes  Are there diet recommendations that you are having difficulty following? na  Patient expresses understanding of discharge instructions and education provided has no other needs at this time. yes     [facility]  [phone] [address], [place], [place] [address] Phone: [phone] Email: [email or web address]

## 2022-05-19 DIAGNOSIS — M0609 Rheumatoid arthritis without rheumatoid factor, multiple sites: Secondary | ICD-10-CM | POA: Diagnosis not present

## 2022-05-19 DIAGNOSIS — R945 Abnormal results of liver function studies: Secondary | ICD-10-CM | POA: Diagnosis not present

## 2022-05-19 DIAGNOSIS — M79674 Pain in right toe(s): Secondary | ICD-10-CM | POA: Diagnosis not present

## 2022-05-19 DIAGNOSIS — M329 Systemic lupus erythematosus, unspecified: Secondary | ICD-10-CM | POA: Diagnosis not present

## 2022-05-21 ENCOUNTER — Ambulatory Visit: Payer: Medicare Other | Admitting: Family Medicine

## 2022-05-23 ENCOUNTER — Encounter: Payer: Self-pay | Admitting: *Deleted

## 2022-05-31 NOTE — Progress Notes (Unsigned)
[place] [facility] [address], [place], [place] [address] [phone]  Date:  06/04/2022   Name:  Amanda Saunders   DOB:  03-08-1956   MRN:  [id number]  PCP:  Darreld Mclean, MD    Chief Complaint: No chief complaint on file.   History of Present Illness:  Amanda Saunders is a 66 y.o. 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   Systemic lupus erythematosus ([place]) 08/04/2019   Acute ischemic stroke ([place]) 08/14/2018   HTN (hypertension) 08/14/2018   Controlled type 2 diabetes mellitus without complication, without long-term current use of insulin ([place]) 04/03/2016   History of CVA (cerebrovascular accident) 03/12/2016   Chest pain at rest 03/12/2016   Peripheral edema 03/12/2016    Past Medical History:  Diagnosis Date   Aortic atherosclerosis ([place])    Arthritis    Cardiomyopathy ([place])    Diverticulosis    Hypertension    Internal hemorrhoids    Lupus ([place])    PUD (peptic ulcer disease)    Stroke ([place]) 08/13/2018   Vertigo     Past Surgical History:  Procedure Laterality Date   BACK SURGERY     CARPAL TUNNEL RELEASE     COLONOSCOPY     KNEE CARTILAGE SURGERY     NECK SURGERY     2015   UPPER GASTROINTESTINAL ENDOSCOPY      Social History   Tobacco Use   Smoking status: Former    Types: Cigarettes    Quit date: 07/21/2004    Years since quitting:  17.8   Smokeless tobacco: Never  Vaping Use   Vaping Use: Never used  Substance Use Topics   Alcohol use: Not Currently    Comment: occasional   Drug use: No    Family History  Problem Relation Age of Onset   Hypertension Mother    Arthritis Mother    Heart failure Mother    Stroke Mother    Kidney disease Father    Hypertension Father    Colon cancer Neg Hx    Esophageal cancer Neg Hx    Rectal cancer Neg Hx    Stomach cancer Neg Hx     Allergies  Allergen Reactions   Codeine Nausea And Vomiting and Other (See Comments)    Medication list has been reviewed and updated.  Current Outpatient Medications on File Prior to Visit  Medication Sig Dispense Refill   ACCU-CHEK GUIDE test strip USE UP TO FOUR TIMES DAILY AS DIRECTED 100 strip 0   Accu-Chek Softclix Lancets lancets USE UP TO FOUR TIMES DAILY AS DIRECTED 100 each 0   belimumab (BENLYSTA) 400 MG SOLR injection Inject into the vein every 30 (thirty) days.     Blood Glucose Monitoring Suppl (ACCU-CHEK GUIDE) w/Device KIT USE UP TO FOUR TIMES DAILY AS DIRECTED 1 kit 0   colchicine 0.6 MG tablet Take 1 tablet (0.6 mg total) by  mouth 2 (two) times daily. 8 tablet 0   diclofenac Sodium (VOLTAREN) 1 % GEL Apply 4 g topically 4 (four) times daily as needed. 500 g 6   ELIQUIS 5 MG TABS tablet TAKE ONE TABLET BY MOUTH EVERY MORNING and TAKE ONE TABLET BY MOUTH EVERYDAY AT BEDTIME 180 tablet 1   gabapentin (NEURONTIN) 800 MG tablet TAKE ONE TABLET BY MOUTH EVERY MORNING and TAKE ONE CAPSULE BY MOUTH EVERYDAY AT BEDTIME 60 tablet 1   hydrochlorothiazide (HYDRODIURIL) 25 MG tablet Take 1 tablet (25 mg total) by mouth daily. Please contact the office to schedule appointment for additional refills. 1st Attempt. 30 tablet 0   hydroxychloroquine (PLAQUENIL) 200 MG tablet Take 1 tablet (200 mg total) by mouth 2 (two) times daily. 30 tablet 0   losartan (COZAAR) 50 MG tablet Take 1 tablet (50 mg total) by mouth every morning. 90 tablet 0    meclizine (ANTIVERT) 25 MG tablet TAKE ONE TABLET BY MOUTH twice daily AS NEEDED FOR dizziness (Patient not taking: Reported on 03/27/2022) 90 tablet 1   metFORMIN (GLUCOPHAGE) 500 MG tablet TAKE ONE TABLET BY MOUTH EVERYDAY AT BEDTIME 90 tablet 3   methocarbamol (ROBAXIN) 500 MG tablet Take 1 tablet (500 mg total) by mouth every 8 (eight) hours as needed for muscle spasms. (Patient not taking: Reported on 03/27/2022) 30 tablet 0   metoprolol succinate (TOPROL-XL) 25 MG 24 hr tablet TAKE ONE TABLET BY MOUTH EVERY MORNING 90 tablet 3   omeprazole (PRILOSEC) 40 MG capsule TAKE ONE CAPSULE BY MOUTH ONCE DAILY 90 capsule 1   ondansetron (ZOFRAN ODT) 8 MG disintegrating tablet Take 1 tablet (8 mg total) by mouth every 8 (eight) hours as needed for nausea or vomiting. 30 tablet 0   predniSONE (DELTASONE) 20 MG tablet Take 2 tablets (40 mg total) by mouth daily. 10 tablet 0   predniSONE (DELTASONE) 20 MG tablet Take 1 tablet (20 mg total) by mouth daily with breakfast. 5 tablet 0   rosuvastatin (CRESTOR) 20 MG tablet TAKE ONE TABLET BY MOUTH EVERYDAY AT BEDTIME (Patient not taking: Reported on 05/13/2022) 90 tablet 3   Semaglutide,0.25 or 0.5MG/DOS, (OZEMPIC, 0.25 OR 0.5 MG/DOSE,) 2 MG/3ML SOPN Inject 0.5 mg into the skin once a week. 3 mL 1   sucralfate (CARAFATE) 1 g tablet Take 1 tablet (1 g total) by mouth 4 (four) times daily -  with meals and at bedtime. As needed 40 tablet 1   traMADol (ULTRAM) 50 MG tablet Take 1 tablet (50 mg total) by mouth every 12 (twelve) hours as needed for severe pain. 30 tablet 0   No current facility-administered medications on file prior to visit.    Review of Systems:  As per HPI- otherwise negative.   Physical Examination: There were no vitals filed for this visit. There were no vitals filed for this visit. There is no height or weight on file to calculate BMI. Ideal Body Weight:    GEN: no acute distress. HEENT: Atraumatic, Normocephalic.  Ears and Nose: No  external deformity. CV: RRR, No M/G/R. No JVD. No thrill. No extra heart sounds. PULM: CTA B, no wheezes, crackles, rhonchi. No retractions. No resp. distress. No accessory muscle use. ABD: S, NT, ND, +BS. No rebound. No HSM. EXTR: No c/c/e PSYCH: Normally interactive. Conversant.    Assessment and Plan: ***  Signed Lamar Blinks, MD

## 2022-06-04 ENCOUNTER — Ambulatory Visit (INDEPENDENT_AMBULATORY_CARE_PROVIDER_SITE_OTHER): Payer: Medicare Other | Admitting: Family Medicine

## 2022-06-04 ENCOUNTER — Other Ambulatory Visit: Payer: Self-pay | Admitting: Family Medicine

## 2022-06-04 ENCOUNTER — Telehealth: Payer: Self-pay | Admitting: Cardiology

## 2022-06-04 VITALS — BP 110/70 | HR 86 | Temp 98.5°F | Resp 18 | Ht 69.0 in | Wt 190.0 lb

## 2022-06-04 DIAGNOSIS — I10 Essential (primary) hypertension: Secondary | ICD-10-CM

## 2022-06-04 DIAGNOSIS — R1013 Epigastric pain: Secondary | ICD-10-CM | POA: Diagnosis not present

## 2022-06-04 DIAGNOSIS — I4891 Unspecified atrial fibrillation: Secondary | ICD-10-CM

## 2022-06-04 DIAGNOSIS — M329 Systemic lupus erythematosus, unspecified: Secondary | ICD-10-CM

## 2022-06-04 DIAGNOSIS — E2839 Other primary ovarian failure: Secondary | ICD-10-CM

## 2022-06-04 DIAGNOSIS — R509 Fever, unspecified: Secondary | ICD-10-CM

## 2022-06-04 DIAGNOSIS — E119 Type 2 diabetes mellitus without complications: Secondary | ICD-10-CM

## 2022-06-04 LAB — CBC
HCT: 36.6 % (ref 36.0–46.0)
Hemoglobin: 12.5 g/dL (ref 12.0–15.0)
MCHC: 34.1 g/dL (ref 30.0–36.0)
MCV: 84.3 fl (ref 78.0–100.0)
Platelets: 200 10*3/uL (ref 150.0–400.0)
RBC: 4.35 Mil/uL (ref 3.87–5.11)
RDW: 16.1 % — ABNORMAL HIGH (ref 11.5–15.5)
WBC: 6.7 10*3/uL (ref 4.0–10.5)

## 2022-06-04 LAB — COMPREHENSIVE METABOLIC PANEL
ALT: 21 U/L (ref 0–35)
AST: 18 U/L (ref 0–37)
Albumin: 4 g/dL (ref 3.5–5.2)
Alkaline Phosphatase: 66 U/L (ref 39–117)
BUN: 14 mg/dL (ref 6–23)
CO2: 34 mEq/L — ABNORMAL HIGH (ref 19–32)
Calcium: 8.9 mg/dL (ref 8.4–10.5)
Chloride: 103 mEq/L (ref 96–112)
Creatinine, Ser: 1.04 mg/dL (ref 0.40–1.20)
GFR: 56.52 mL/min — ABNORMAL LOW (ref >60.00)
Glucose, Bld: 98 mg/dL (ref 70–99)
Potassium: 3.7 mEq/L (ref 3.5–5.1)
Sodium: 142 mEq/L (ref 135–145)
Total Bilirubin: 0.3 mg/dL (ref 0.2–1.2)
Total Protein: 6.6 g/dL (ref 6.0–8.3)

## 2022-06-04 LAB — BRAIN NATRIURETIC PEPTIDE: Pro B Natriuretic peptide (BNP): 73 pg/mL (ref 0.0–100.0)

## 2022-06-04 LAB — SEDIMENTATION RATE: Sed Rate: 15 mm/hr (ref 0–30)

## 2022-06-04 LAB — TSH: TSH: 1.9 u[IU]/mL (ref 0.35–5.50)

## 2022-06-04 LAB — TROPONIN I (HIGH SENSITIVITY): High Sens Troponin I: 5 ng/L (ref 2–17)

## 2022-06-04 LAB — LIPASE: Lipase: 37 U/L (ref 11.0–59.0)

## 2022-06-04 NOTE — Telephone Encounter (Signed)
Discussed Amanda Saunders's case.She presented with rate controlled atrial fibrillation. She was seen for abdominal pain. She is already on BB and anticoagulation. Recommended to continue this and w/u abdominal pain.

## 2022-06-04 NOTE — Telephone Encounter (Signed)
Incoming call from Dr. Lorelei Pont to discuss new onset afib.

## 2022-06-04 NOTE — Patient Instructions (Signed)
Please do keep a record of your fevers I will be in touch with your labs asap Need to get your gallbladder US today or tomorrow!   Please do go to the ER if you are feeling worse For the atrial fib continue the Eliquis and metoprolol  I will touch base with [place] for you

## 2022-06-05 ENCOUNTER — Ambulatory Visit (HOSPITAL_BASED_OUTPATIENT_CLINIC_OR_DEPARTMENT_OTHER)
Admission: RE | Admit: 2022-06-05 | Discharge: 2022-06-05 | Disposition: A | Discharge status: Home / Self Care | Payer: Medicare Other | Source: Ambulatory Visit | Attending: Family Medicine | Admitting: Family Medicine

## 2022-06-05 ENCOUNTER — Encounter: Payer: Self-pay | Admitting: Family Medicine

## 2022-06-05 DIAGNOSIS — R509 Fever, unspecified: Secondary | ICD-10-CM

## 2022-06-05 DIAGNOSIS — R1013 Epigastric pain: Secondary | ICD-10-CM | POA: Diagnosis present

## 2022-06-05 DIAGNOSIS — E2839 Other primary ovarian failure: Secondary | ICD-10-CM

## 2022-06-06 ENCOUNTER — Ambulatory Visit: Payer: Medicare Other | Admitting: Gastroenterology

## 2022-06-06 ENCOUNTER — Encounter: Payer: Self-pay | Admitting: Gastroenterology

## 2022-06-06 ENCOUNTER — Telehealth: Payer: Self-pay | Admitting: *Deleted

## 2022-06-06 ENCOUNTER — Ambulatory Visit (INDEPENDENT_AMBULATORY_CARE_PROVIDER_SITE_OTHER): Payer: Medicare Other | Admitting: Gastroenterology

## 2022-06-06 VITALS — BP 110/78 | HR 105 | Ht 63.0 in | Wt 190.0 lb

## 2022-06-06 DIAGNOSIS — R1310 Dysphagia, unspecified: Secondary | ICD-10-CM

## 2022-06-06 DIAGNOSIS — Z7901 Long term (current) use of anticoagulants: Secondary | ICD-10-CM

## 2022-06-06 DIAGNOSIS — R112 Nausea with vomiting, unspecified: Secondary | ICD-10-CM | POA: Diagnosis not present

## 2022-06-06 LAB — URINE CULTURE
MICRO NUMBER:: 14192746
SPECIMEN QUALITY:: ADEQUATE

## 2022-06-06 NOTE — Telephone Encounter (Signed)
Request for surgical clearance:     Endoscopy Procedure  What type of surgery is being performed?     endoscopy  When is this surgery scheduled?     07/03/22  What type of clearance is required ?   Pharmacy  Are there any medications that need to be held prior to surgery and how long? Eliquis, 2 days  Practice name and name of physician performing surgery?      [facility]  What is your office phone and fax number?      Phone- [phone]  [phone]  Anesthesia type (None, local, MAC, general) ?       MAC

## 2022-06-06 NOTE — Progress Notes (Signed)
06/06/2022 Amanda Saunders [id number] [birth year]   HISTORY OF PRESENT ILLNESS: This is a 66 year old female who is a patient of Dr. Shawna Clamp, known to him only for colonoscopy earlier this year.  She is here today with complaints of feeling like food, pills, etc. get stuck in her esophagus when she swallows them.  It causes pain substernally.  She says that sometimes she will drink and it goes down, but other times it comes back up.  She has been having more regurgitation and nausea as well.  She is on omeprazole 40 mg daily for some time.  Her PCP recently added Carafate tablet to be used as needed.  She is only taken it on occasion, and does says it does not seem to help.  She tells me that she had an upper endoscopy was diagnosed with an ulcer back in 1999, but has not had any major issues since then.  She is on Eliquis for history of CVA that is prescribed by Dr. Stanford Breed.   Past Medical History:  Diagnosis Date   Aortic atherosclerosis ([place])    Arthritis    Cardiomyopathy ([place])    Diverticulosis    Hypertension    Internal hemorrhoids    Lupus ([place])    PUD (peptic ulcer disease)    Stroke ([place]) 08/13/2018   Vertigo    Past Surgical History:  Procedure Laterality Date   BACK SURGERY     CARPAL TUNNEL RELEASE     COLONOSCOPY     KNEE CARTILAGE SURGERY     NECK SURGERY     2015   UPPER GASTROINTESTINAL ENDOSCOPY      reports that she quit smoking about 17 years ago. Her smoking use included cigarettes. She has never used smokeless tobacco. She reports that she does not currently use alcohol. She reports that she does not use drugs. family history includes Arthritis in her mother; Heart failure in her mother; Hypertension in her father and mother; Kidney disease in her father; Stroke in her mother. Allergies  Allergen Reactions   Codeine Nausea And Vomiting and Other (See Comments)      Outpatient Encounter Medications as of 06/06/2022  Medication Sig   ACCU-CHEK  GUIDE test strip USE UP TO FOUR TIMES DAILY AS DIRECTED   Accu-Chek Softclix Lancets lancets USE UP TO FOUR TIMES DAILY AS DIRECTED   belimumab (BENLYSTA) 400 MG SOLR injection Inject into the vein every 30 (thirty) days.   Blood Glucose Monitoring Suppl (ACCU-CHEK GUIDE) w/Device KIT USE UP TO FOUR TIMES DAILY AS DIRECTED   colchicine 0.6 MG tablet Take 1 tablet (0.6 mg total) by mouth 2 (two) times daily.   diclofenac Sodium (VOLTAREN) 1 % GEL Apply 4 g topically 4 (four) times daily as needed.   ELIQUIS 5 MG TABS tablet TAKE ONE TABLET BY MOUTH EVERY MORNING and TAKE ONE TABLET BY MOUTH EVERYDAY AT BEDTIME   gabapentin (NEURONTIN) 800 MG tablet TAKE ONE TABLET BY MOUTH EVERY MORNING and TAKE ONE CAPSULE BY MOUTH EVERYDAY AT BEDTIME   hydrochlorothiazide (HYDRODIURIL) 25 MG tablet Take 1 tablet (25 mg total) by mouth daily. Please contact the office to schedule appointment for additional refills. 1st Attempt.   hydroxychloroquine (PLAQUENIL) 200 MG tablet Take 1 tablet (200 mg total) by mouth 2 (two) times daily.   losartan (COZAAR) 50 MG tablet Take 1 tablet (50 mg total) by mouth every morning.   meclizine (ANTIVERT) 25 MG tablet TAKE ONE TABLET BY MOUTH twice daily  AS NEEDED FOR dizziness   metFORMIN (GLUCOPHAGE) 500 MG tablet TAKE ONE TABLET BY MOUTH EVERYDAY AT BEDTIME   methocarbamol (ROBAXIN) 500 MG tablet Take 1 tablet (500 mg total) by mouth every 8 (eight) hours as needed for muscle spasms.   metoprolol succinate (TOPROL-XL) 25 MG 24 hr tablet TAKE ONE TABLET BY MOUTH EVERY MORNING   omeprazole (PRILOSEC) 40 MG capsule TAKE ONE CAPSULE BY MOUTH ONCE DAILY   ondansetron (ZOFRAN ODT) 8 MG disintegrating tablet Take 1 tablet (8 mg total) by mouth every 8 (eight) hours as needed for nausea or vomiting.   rosuvastatin (CRESTOR) 20 MG tablet TAKE ONE TABLET BY MOUTH EVERYDAY AT BEDTIME   Semaglutide,0.25 or 0.5MG/DOS, (OZEMPIC, 0.25 OR 0.5 MG/DOSE,) 2 MG/3ML SOPN Inject 0.5 mg into the skin  once a week.   sucralfate (CARAFATE) 1 g tablet Take 1 tablet (1 g total) by mouth 4 (four) times daily -  with meals and at bedtime. As needed   traMADol (ULTRAM) 50 MG tablet Take 1 tablet (50 mg total) by mouth every 12 (twelve) hours as needed for severe pain.   No facility-administered encounter medications on file as of 06/06/2022.     REVIEW OF SYSTEMS  : All other systems reviewed and negative except where noted in the History of Present Illness.   PHYSICAL EXAM: BP 110/78   Pulse (!) 105   Ht _0  (1.6 m)   Wt 190 lb (86.2 kg)   SpO2 97%   BMI 33.66 kg/m  General: Well developed female in no acute distress Head: Normocephalic and atraumatic Eyes:  Sclerae anicteric, conjunctiva pink. Ears: Normal auditory acuity Lungs: Clear throughout to auscultation; no W/R/R. Heart: Regular rate and rhythm; no M/R/G. Abdomen: Soft, non-distended.  BS present.  Non-tender. Musculoskeletal: Symmetrical with no gross deformities  Skin: No lesions on visible extremities Extremities: No edema  Neurological: Alert oriented x 4, grossly non-focal Psychological:  Alert and cooperative. Normal mood and affect  ASSESSMENT AND PLAN: *Dysphagia, nausea, vomiting: Food, pills, etc. getting stuck in the lower esophagus for the past 3 months or so causing nausea, regurgitation/vomiting.  Is on omeprazole 40 mg once daily and recently was prescribed Carafate tablets 4 times daily, but she is only been using them intermittently as they were prescribed as needed.  I have asked her to begin using that regularly before meals and at bedtime and have also instructed her on how to make it into a suspension/slurry to try to get better esophageal coating and coverage.  We will plan for EGD with possible dilation with Dr. Fuller Plan. *Chronic anticoagulation with Eliquis due to history of CVA  Will hold Eliquis for 2 days prior to endoscopic procedures - will instruct when and how to resume after procedure. Benefits  and risks of procedure explained including risks of bleeding, perforation, infection, missed lesions, reactions to medications and possible need for hospitalization and surgery for complications. Additional rare but real risk of stroke or other vascular clotting events off of Eliquis also explained and need to seek urgent help if any signs of these problems occur. Will communicate by phone or EMR with patient's prescribing provider, Dr. Stanford Breed, to confirm that holding Eliquis is reasonable in this case.     CC:  Copland, Gay Filler, MD

## 2022-06-06 NOTE — Patient Instructions (Signed)
We have sent the following medications to your pharmacy for you to pick up at your convenience: Carafate 1 g - Dissolve 1 tablet in 10 ml of water four times daily.  You have been scheduled for a colonoscopy. Please follow written instructions given to you at your visit today.  Please pick up your prep supplies at the pharmacy within the next 1-3 days. If you use inhalers (even only as needed), please bring them with you on the day of your procedure.  _______________________________________________________  If you are age 17 or older, your body mass index should be between 23-30. Your Body mass index is 33.66 kg/m. If this is out of the aforementioned range listed, please consider follow up with your Primary Care Provider.  If you are age 62 or younger, your body mass index should be between 19-25. Your Body mass index is 33.66 kg/m. If this is out of the aformentioned range listed, please consider follow up with your Primary Care Provider.   ________________________________________________________  The  GI providers would like to encourage you to use [facility] to communicate with providers for non-urgent requests or questions.  Due to long hold times on the telephone, sending your provider a message by [facility] may be a faster and more efficient way to get a response.  Please allow 48 business hours for a response.  Please remember that this is for non-urgent requests.  _______________________________________________________

## 2022-06-09 LAB — CULTURE, BLOOD (SINGLE)
MICRO NUMBER:: 14192743
MICRO NUMBER:: 14192745
Result:: NO GROWTH
SPECIMEN QUALITY:: ADEQUATE

## 2022-06-09 NOTE — Telephone Encounter (Signed)
    Primary Cardiologist:Brian Stanford Breed, MD  Chart reviewed as part of pre-operative protocol coverage. Because of Amanda Saunders's past medical history and time since last visit, he/she will require a follow-up visit in order to better assess preoperative cardiovascular risk.  Pre-op covering staff: - Please schedule appointment and call patient to inform them. - Please contact requesting surgeon's office via preferred method (i.e, phone, fax) to inform them of need for appointment prior to surgery.  If applicable, this message will also be routed to pharmacy pool and/or primary cardiologist for input on holding anticoagulant/antiplatelet agent as requested below so that this information is available at time of patient's appointment.   Deberah Pelton, NP  06/09/2022, 7:35 AM

## 2022-06-09 NOTE — Telephone Encounter (Signed)
Patient with diagnosis of noncompaction and CVA on Eliquis for anticoagulation.    Procedure: endoscopy Date of procedure: 07/03/22  CrCl 52m/min using adjusted body weight due to obesity Platelet count 200K  Pt is overdue for cardiology visit, has not been seen since 02/2021. Will also need MD recommendations for length of Eliquis hold given off-label indication for use.  **This guidance is not considered finalized until pre-operative APP has relayed final recommendations.**

## 2022-06-10 ENCOUNTER — Encounter: Payer: Self-pay | Admitting: Family Medicine

## 2022-06-10 NOTE — Telephone Encounter (Signed)
I s/w the pt and she is agreeable to plan of care for in office appt with Dr. Stanford Breed 06/17/22 @ 4:30 ok per Hilda Blades, RN for Dr. Stanford Breed.

## 2022-06-10 NOTE — Telephone Encounter (Signed)
I s/w the pt and she states she only wants to see Dr. Stanford Breed as she prefers not to see an APP. I stated that I will need to to d/w RN. I said that I will call her right back.    I was d/w Hilda Blades, RN for Dr, Stanford Breed and she has given me the ok to use 06/17/22 one of the 2 acute slots that are open. I called the pt right and I got her vm.   Per Luisa Dago. RN we can use 06/17/22 one of the 2 acute slots open, need to change the time to 30 minute appt as Dr. Stanford Breed will be DOD that day.

## 2022-06-11 ENCOUNTER — Telehealth: Payer: Self-pay

## 2022-06-11 NOTE — Telephone Encounter (Signed)
VOB submitted for Durolane, bilateral knee.  

## 2022-06-11 NOTE — Telephone Encounter (Signed)
FYI

## 2022-06-14 ENCOUNTER — Emergency Department (HOSPITAL_BASED_OUTPATIENT_CLINIC_OR_DEPARTMENT_OTHER)
Admission: EM | Admit: 2022-06-14 | Discharge: 2022-06-14 | Disposition: A | Discharge status: Home / Self Care | Payer: Medicare Other | Attending: Emergency Medicine | Admitting: Emergency Medicine

## 2022-06-14 ENCOUNTER — Other Ambulatory Visit: Payer: Self-pay

## 2022-06-14 ENCOUNTER — Encounter (HOSPITAL_BASED_OUTPATIENT_CLINIC_OR_DEPARTMENT_OTHER): Payer: Self-pay | Admitting: Emergency Medicine

## 2022-06-14 ENCOUNTER — Emergency Department (HOSPITAL_BASED_OUTPATIENT_CLINIC_OR_DEPARTMENT_OTHER): Payer: Medicare Other

## 2022-06-14 DIAGNOSIS — R072 Precordial pain: Secondary | ICD-10-CM

## 2022-06-14 DIAGNOSIS — Z7984 Long term (current) use of oral hypoglycemic drugs: Secondary | ICD-10-CM | POA: Insufficient documentation

## 2022-06-14 DIAGNOSIS — I4891 Unspecified atrial fibrillation: Secondary | ICD-10-CM | POA: Insufficient documentation

## 2022-06-14 DIAGNOSIS — J069 Acute upper respiratory infection, unspecified: Secondary | ICD-10-CM

## 2022-06-14 DIAGNOSIS — M549 Dorsalgia, unspecified: Secondary | ICD-10-CM | POA: Insufficient documentation

## 2022-06-14 DIAGNOSIS — R0981 Nasal congestion: Secondary | ICD-10-CM | POA: Insufficient documentation

## 2022-06-14 DIAGNOSIS — R0602 Shortness of breath: Secondary | ICD-10-CM | POA: Diagnosis not present

## 2022-06-14 DIAGNOSIS — R600 Localized edema: Secondary | ICD-10-CM | POA: Insufficient documentation

## 2022-06-14 DIAGNOSIS — R079 Chest pain, unspecified: Secondary | ICD-10-CM | POA: Diagnosis not present

## 2022-06-14 DIAGNOSIS — Z1152 Encounter for screening for COVID-19: Secondary | ICD-10-CM | POA: Diagnosis not present

## 2022-06-14 DIAGNOSIS — R509 Fever, unspecified: Secondary | ICD-10-CM | POA: Diagnosis present

## 2022-06-14 DIAGNOSIS — Z7901 Long term (current) use of anticoagulants: Secondary | ICD-10-CM | POA: Insufficient documentation

## 2022-06-14 DIAGNOSIS — I10 Essential (primary) hypertension: Secondary | ICD-10-CM

## 2022-06-14 LAB — CBC WITH DIFFERENTIAL/PLATELET
Abs Immature Granulocytes: 0.04 10*3/uL (ref 0.00–0.07)
Basophils Absolute: 0 10*3/uL (ref 0.0–0.1)
Basophils Relative: 0 %
Eosinophils Absolute: 0 10*3/uL (ref 0.0–0.5)
Eosinophils Relative: 1 %
HCT: 32.8 % — ABNORMAL LOW (ref 36.0–46.0)
Hemoglobin: 10.8 g/dL — ABNORMAL LOW (ref 12.0–15.0)
Immature Granulocytes: 1 %
Lymphocytes Relative: 11 %
Lymphs Abs: 0.8 10*3/uL (ref 0.7–4.0)
MCH: 27.8 pg (ref 26.0–34.0)
MCHC: 32.9 g/dL (ref 30.0–36.0)
MCV: 84.3 fL (ref 80.0–100.0)
Monocytes Absolute: 0.6 10*3/uL (ref 0.1–1.0)
Monocytes Relative: 9 %
Neutro Abs: 5.2 10*3/uL (ref 1.7–7.7)
Neutrophils Relative %: 78 %
Platelets: 259 10*3/uL (ref 150–400)
RBC: 3.89 MIL/uL (ref 3.87–5.11)
RDW: 15.1 % (ref 11.5–15.5)
WBC: 6.6 10*3/uL (ref 4.0–10.5)
nRBC: 0 % (ref 0.0–0.2)

## 2022-06-14 LAB — SARS CORONAVIRUS 2 BY RT PCR: SARS Coronavirus 2 by RT PCR: NEGATIVE

## 2022-06-14 LAB — COMPREHENSIVE METABOLIC PANEL
ALT: 20 U/L (ref 0–44)
AST: 21 U/L (ref 15–41)
Albumin: 3.3 g/dL — ABNORMAL LOW (ref 3.5–5.0)
Alkaline Phosphatase: 68 U/L (ref 38–126)
Anion gap: 7 (ref 5–15)
BUN: 10 mg/dL (ref 8–23)
CO2: 25 mmol/L (ref 22–32)
Calcium: 8.4 mg/dL — ABNORMAL LOW (ref 8.9–10.3)
Chloride: 105 mmol/L (ref 98–111)
Creatinine, Ser: 0.92 mg/dL (ref 0.44–1.00)
GFR, Estimated: >60 mL/min (ref >60)
Glucose, Bld: 104 mg/dL — ABNORMAL HIGH (ref 70–99)
Potassium: 3.6 mmol/L (ref 3.5–5.1)
Sodium: 137 mmol/L (ref 135–145)
Total Bilirubin: 0.3 mg/dL (ref 0.3–1.2)
Total Protein: 6.6 g/dL (ref 6.5–8.1)

## 2022-06-14 LAB — BRAIN NATRIURETIC PEPTIDE: B Natriuretic Peptide: 100.6 pg/mL — ABNORMAL HIGH (ref 0.0–100.0)

## 2022-06-14 LAB — TROPONIN I (HIGH SENSITIVITY)
Troponin I (High Sensitivity): 6 ng/L (ref <18)
Troponin I (High Sensitivity): 6 ng/L (ref <18)

## 2022-06-14 MED ORDER — METOPROLOL TARTRATE 5 MG/5ML IV SOLN
5.0000 mg | INTRAVENOUS | Status: AC
  Administered 2022-06-14: 5 mg via INTRAVENOUS
  Filled 2022-06-14: qty 5

## 2022-06-14 MED ORDER — ONDANSETRON HCL 4 MG/2ML IJ SOLN
4.0000 mg | Freq: Once | INTRAMUSCULAR | Status: AC
  Administered 2022-06-14: 4 mg via INTRAVENOUS
  Filled 2022-06-14: qty 2

## 2022-06-14 MED ORDER — ACETAMINOPHEN 500 MG PO TABS
1000.0000 mg | ORAL_TABLET | ORAL | Status: AC
  Administered 2022-06-14: 1000 mg via ORAL
  Filled 2022-06-14: qty 2

## 2022-06-14 MED ORDER — METOPROLOL SUCCINATE ER 50 MG PO TB24: 50.0000 mg | ORAL_TABLET | Freq: Every morning | ORAL | 0 refills | Status: DC

## 2022-06-14 MED ORDER — METOPROLOL TARTRATE 5 MG/5ML IV SOLN
2.5000 mg | Freq: Once | INTRAVENOUS | Status: AC
  Administered 2022-06-14: 2.5 mg via INTRAVENOUS
  Filled 2022-06-14: qty 5

## 2022-06-14 MED ORDER — METOPROLOL SUCCINATE ER 25 MG PO TB24
25.0000 mg | ORAL_TABLET | ORAL | Status: AC
  Administered 2022-06-14: 25 mg via ORAL
  Filled 2022-06-14: qty 1

## 2022-06-14 MED ORDER — LEVOFLOXACIN 750 MG PO TABS
750.0000 mg | ORAL_TABLET | Freq: Once | ORAL | Status: AC
  Administered 2022-06-14: 750 mg via ORAL
  Filled 2022-06-14: qty 1

## 2022-06-14 MED ORDER — IOHEXOL 350 MG/ML SOLN
75.0000 mL | Freq: Once | INTRAVENOUS | Status: AC | PRN
  Administered 2022-06-14: 75 mL via INTRAVENOUS

## 2022-06-14 NOTE — ED Provider Notes (Signed)
  Physical Exam  BP 126/87   Pulse 97   Temp 100 F (37.8 C)   Resp (!) 29   Ht '5\' 3"'$  (1.6 m)   Wt 86 kg   SpO2 96%   BMI 33.59 kg/m   Physical Exam  Procedures  Procedures  ED Course / MDM   Clinical Course as of 06/14/22 1954  Sat Jun 14, 2022  0708 Assumed care from Dr Dayna Barker.  66 year old female with a history of SLE, RA, cardiomyopathy, AF on eliquis pw orthopnea and doe. Not significantly volume overloaded on exam. Sinus congestion, cough, and fever too. Has had tachycardia here. Had fever this morning of 101.11F at home.  Overnight had CXR that showed edema vs atypical infection.  Patient has remained persistently tachycardic.  On my reassessment is well-appearing.  Did give Levaquin for suspected atypical infection.  Gave additional dose of metoprolol IV without significant improvement of her heart rate.  Will obtain CTA at this time to evaluate for possible pulmonary embolism. [RP]  1500 Patient was frequently reassessed and was in atrial fibrillation with RVR.  Required several additional doses of metoprolol IV which improved her heart rate.  CT scan did not show evidence of bacterial pneumonia or infection.  As such feel that antibiotics are not warranted for the patient at this time and that she likely has a URI.  With her rate now controlled she was instructed to increase her dose of her metoprolol XL to 50 mg/day and to follow-up with her primary doctor and cardiologist regarding her symptoms.  Return precautions discussed prior to discharge. [RP]    Clinical Course User Index [RP] Fransico Meadow, MD   Medical Decision Making Amount and/or Complexity of Data Reviewed Labs: ordered. Radiology: ordered. ECG/medicine tests: ordered.  Risk OTC drugs. Prescription drug management.   CRITICAL CARE Performed by: Fransico Meadow   Total critical care time: 30 minutes  Critical care time was exclusive of separately billable procedures and treating other  patients.  Critical care was necessary to treat or prevent imminent or life-threatening deterioration.  Critical care was time spent personally by me on the following activities: development of treatment plan with patient and/or surrogate as well as nursing, discussions with consultants, evaluation of patient's response to treatment, examination of patient, obtaining history from patient or surrogate, ordering and performing treatments and interventions, ordering and review of laboratory studies, ordering and review of radiographic studies, pulse oximetry and re-evaluation of patient's condition.        Fransico Meadow, MD 06/14/22 Karl Bales

## 2022-06-14 NOTE — ED Provider Notes (Signed)
[facility] Provider Note   CSN: [id number] Arrival date & time: 06/14/22  0554     History  Chief Complaint  Patient presents with   URI    Amanda Saunders is a 66 y.o. female.  66 year old female who presents the ER today secondary to shortness of breath.  Patient states for the last few days she has had progressively worsening exertional dyspnea and orthopnea to the point where she has to sleep on 4 pillows.  No history of heart failure but she does take hydrochlorothiazide.  Has a history of cardiomyopathy per her primary care notes.  She she also has a history of lupus and has had recurrent fevers recently thought to be related to her autoimmune disease.  States he is compliant with her Eliquis and all her other medications.  She had some nasal congestion but no other significant cough.  Temperature as high as 102 at home.  Mild lower extremity edema.  Has had some left sided chest and back pain but is not exertional.   URI      Home Medications Prior to Admission medications   Medication Sig Start Date End Date Taking? Authorizing Provider  ACCU-CHEK GUIDE test strip USE UP TO FOUR TIMES DAILY AS DIRECTED 02/14/22   Copland, Gay Filler, MD  Accu-Chek Softclix Lancets lancets USE UP TO FOUR TIMES DAILY AS DIRECTED 02/14/22   Copland, Gay Filler, MD  belimumab (BENLYSTA) 400 MG SOLR injection Inject into the vein every 30 (thirty) days.    [provider]  Blood Glucose Monitoring Suppl (ACCU-CHEK GUIDE) w/Device KIT USE UP TO FOUR TIMES DAILY AS DIRECTED 02/14/22   Copland, Gay Filler, MD  colchicine 0.6 MG tablet Take 1 tablet (0.6 mg total) by mouth 2 (two) times daily. 05/11/22   Montine Circle, PA-C  diclofenac Sodium (VOLTAREN) 1 % GEL Apply 4 g topically 4 (four) times daily as needed. 03/08/21   Hilts, Legrand Como, MD  ELIQUIS 5 MG TABS tablet TAKE ONE TABLET BY MOUTH EVERY MORNING and TAKE ONE TABLET BY MOUTH EVERYDAY AT BEDTIME  02/06/22   Lelon Perla, MD  gabapentin (NEURONTIN) 800 MG tablet TAKE ONE TABLET BY MOUTH EVERY MORNING and TAKE ONE CAPSULE BY MOUTH EVERYDAY AT BEDTIME 05/14/22   Tomi Likens, Adam R, DO  hydrochlorothiazide (HYDRODIURIL) 25 MG tablet Take 1 tablet (25 mg total) by mouth daily. Please contact the office to schedule appointment for additional refills. 1st Attempt. 05/14/22   Lelon Perla, MD  hydroxychloroquine (PLAQUENIL) 200 MG tablet Take 1 tablet (200 mg total) by mouth 2 (two) times daily. 06/26/21   Copland, Gay Filler, MD  losartan (COZAAR) 50 MG tablet Take 1 tablet (50 mg total) by mouth every morning. 05/14/22   Copland, Gay Filler, MD  meclizine (ANTIVERT) 25 MG tablet TAKE ONE TABLET BY MOUTH twice daily AS NEEDED FOR dizziness 10/10/20   Copland, Gay Filler, MD  metFORMIN (GLUCOPHAGE) 500 MG tablet TAKE ONE TABLET BY MOUTH EVERYDAY AT BEDTIME 11/11/21   Copland, Gay Filler, MD  methocarbamol (ROBAXIN) 500 MG tablet Take 1 tablet (500 mg total) by mouth every 8 (eight) hours as needed for muscle spasms. 12/04/21   Copland, Gay Filler, MD  metoprolol succinate (TOPROL-XL) 25 MG 24 hr tablet TAKE ONE TABLET BY MOUTH EVERY MORNING 02/06/22   Copland, Gay Filler, MD  omeprazole (PRILOSEC) 40 MG capsule TAKE ONE CAPSULE BY MOUTH ONCE DAILY 02/06/22   Copland, Gay Filler, MD  ondansetron (ZOFRAN ODT)  8 MG disintegrating tablet Take 1 tablet (8 mg total) by mouth every 8 (eight) hours as needed for nausea or vomiting. 04/12/22   Copland, Gay Filler, MD  rosuvastatin (CRESTOR) 20 MG tablet TAKE ONE TABLET BY MOUTH EVERYDAY AT BEDTIME 08/22/21   Copland, Gay Filler, MD  Semaglutide,0.25 or 0.5MG/DOS, (OZEMPIC, 0.25 OR 0.5 MG/DOSE,) 2 MG/3ML SOPN Inject 0.5 mg into the skin once a week. 04/28/22   Copland, Gay Filler, MD  sucralfate (CARAFATE) 1 g tablet Take 1 tablet (1 g total) by mouth 4 (four) times daily -  with meals and at bedtime. As needed 04/12/22   Copland, Gay Filler, MD  traMADol (ULTRAM) 50 MG tablet  Take 1 tablet (50 mg total) by mouth every 12 (twelve) hours as needed for severe pain. 03/27/22   Copland, Gay Filler, MD      Allergies    Codeine    Review of Systems   Review of Systems  Physical Exam Updated Vital Signs BP (!) 146/101 (BP Location: Right Arm)   Pulse (!) 124   Temp 99.3 F (37.4 C) (Oral)   Resp 20   Ht _0  (1.6 m)   Wt 86 kg   SpO2 95%   BMI 33.59 kg/m  Physical Exam Vitals and nursing note reviewed.  Constitutional:      Appearance: She is well-developed.  HENT:     Head: Normocephalic and atraumatic.     Mouth/Throat:     Mouth: Mucous membranes are dry.  Eyes:     Pupils: Pupils are equal, round, and reactive to light.  Cardiovascular:     Rate and Rhythm: Tachycardia present. Rhythm irregular.  Pulmonary:     Effort: No respiratory distress.     Breath sounds: No stridor.     Comments: Tachypneic and mildly hypoxic Abdominal:     General: Abdomen is flat. There is no distension.  Musculoskeletal:        General: No swelling or tenderness. Normal range of motion.     Cervical back: Normal range of motion.  Skin:    General: Skin is warm and dry.  Neurological:     General: No focal deficit present.     Mental Status: She is alert.     ED Results / Procedures / Treatments   Labs (all labs ordered are listed, but only abnormal results are displayed) Labs Reviewed  SARS CORONAVIRUS 2 BY RT PCR  CBC WITH DIFFERENTIAL/PLATELET  COMPREHENSIVE METABOLIC PANEL  BRAIN NATRIURETIC PEPTIDE  TROPONIN I (HIGH SENSITIVITY)    EKG None  Radiology No results found.  Procedures Procedures    Medications Ordered in ED Medications - No data to display  ED Course/ Medical Decision Making/ A&P                          Medical Decision Making Amount and/or Complexity of Data Reviewed Labs: ordered. Radiology: ordered. ECG/medicine tests: ordered.  Risk OTC drugs. Prescription drug management.   Concern for pneumonia versus  heart failure versus less likely ACS and less likely PE as she is on Eliquis.  Symptoms seem most consistent with heart failure.  Exclude the fevers that she had fevers without obvious etiology multiple times in the past year.  Also with history of A-fib and does appear to be A-fib with intermittent fast heart rates here.  Care transferred pending completion of workup and ultimate disposition.   Final Clinical Impression(s) / ED Diagnoses Final  diagnoses:  None    Rx / DC Orders ED Discharge Orders     None         Nadiah Corbit, Corene Cornea, MD 06/14/22 2316

## 2022-06-14 NOTE — ED Triage Notes (Signed)
Cough, sinus congestion, fever and sob X 2 days.

## 2022-06-14 NOTE — Discharge Instructions (Signed)
You were seen for your shortness of breath.  It is likely that this is from an upper respiratory tract infection.  This does not require antibiotics.   You were also found to be in atrial fibrillation with a rapid heart rate which improved with medication.  At home, please take your metoprolol XL at 50 mg every day.    Check your MyChart online for the results of any tests that had not resulted by the time you left the emergency department.   Follow-up with your primary doctor in 2-3 days regarding your visit.    Return immediately to the emergency department if you experience any of the following: Difficulty breathing, chest pain, or any other concerning symptoms.    Thank you for visiting our Emergency Department. It was a pleasure taking care of you today.

## 2022-06-16 NOTE — Progress Notes (Unsigned)
HPI: FU cardiomyopathy. Nuclear study November 2017 showed no infarct or ischemia.  LV function mildly reduced.  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 Had CVA January 2020. Carotid Dopplers January 2020 showed 1 to 39% bilateral stenosis. Cardiac MRI 8/20 showed EF 44 and findings c/w noncompaction, mild LAE, mild AI and MR.  Last echocardiogram September 2022 showed ejection fraction 50 to 55%, apical trabeculations suggestive of LV noncompaction.  Patient seen in the emergency room recently with complaints of dyspnea.  Troponins were normal.  BNP 100.6.  Hemoglobin 10.8.  CTA November 2023 showed no pulmonary embolus but there was note of small pleural effusions.  Patient felt to have CHF and also URI.  Patient also with newly diagnosed atrial fibrillation.  Since last seen, in the past 1 month she has noticed increased dyspnea on exertion.  She denies orthopnea, PND, pedal edema, chest pain or syncope.  She has had palpitations.  Current Outpatient Medications  Medication Sig Dispense Refill   ACCU-CHEK GUIDE test strip USE UP TO FOUR TIMES DAILY AS DIRECTED 100 strip 0   Accu-Chek Softclix Lancets lancets USE UP TO FOUR TIMES DAILY AS DIRECTED 100 each 0   belimumab (BENLYSTA) 400 MG SOLR injection Inject into the vein every 30 (thirty) days.     Blood Glucose Monitoring Suppl (ACCU-CHEK GUIDE) w/Device KIT USE UP TO FOUR TIMES DAILY AS DIRECTED 1 kit 0   colchicine 0.6 MG tablet Take 1 tablet (0.6 mg total) by mouth 2 (two) times daily. 8 tablet 0   diclofenac Sodium (VOLTAREN) 1 % GEL Apply 4 g topically 4 (four) times daily as needed. 500 g 6   ELIQUIS 5 MG TABS tablet TAKE ONE TABLET BY MOUTH EVERY MORNING and TAKE ONE TABLET BY MOUTH EVERYDAY AT BEDTIME 180 tablet 1   gabapentin (NEURONTIN) 800 MG tablet TAKE ONE TABLET BY MOUTH EVERY MORNING and TAKE ONE CAPSULE BY  MOUTH EVERYDAY AT BEDTIME 60 tablet 1   hydrochlorothiazide (HYDRODIURIL) 25 MG tablet Take 1 tablet (25 mg total) by mouth daily. Please contact the office to schedule appointment for additional refills. 1st Attempt. 30 tablet 0   hydroxychloroquine (PLAQUENIL) 200 MG tablet Take 1 tablet (200 mg total) by mouth 2 (two) times daily. 30 tablet 0   losartan (COZAAR) 50 MG tablet Take 1 tablet (50 mg total) by mouth every morning. 90 tablet 0   meclizine (ANTIVERT) 25 MG tablet TAKE ONE TABLET BY MOUTH twice daily AS NEEDED FOR dizziness 90 tablet 1   metFORMIN (GLUCOPHAGE) 500 MG tablet TAKE ONE TABLET BY MOUTH EVERYDAY AT BEDTIME 90 tablet 3   methocarbamol (ROBAXIN) 500 MG tablet Take 1 tablet (500 mg total) by mouth every 8 (eight) hours as needed for muscle spasms. 30 tablet 0   metoprolol succinate (TOPROL-XL) 50 MG 24 hr tablet Take 1 tablet (50 mg total) by mouth every morning. 30 tablet 0   omeprazole (PRILOSEC) 40 MG capsule TAKE ONE CAPSULE BY MOUTH ONCE DAILY 90 capsule 1   ondansetron (ZOFRAN ODT) 8 MG disintegrating tablet Take 1 tablet (8 mg total) by mouth every 8 (eight) hours as needed for nausea or vomiting. 30 tablet 0   rosuvastatin (CRESTOR) 20 MG tablet TAKE ONE TABLET BY MOUTH EVERYDAY AT BEDTIME 90 tablet 3   Semaglutide,0.25 or 0.5MG/DOS, (OZEMPIC, 0.25 OR 0.5 MG/DOSE,) 2 MG/3ML SOPN Inject 0.5 mg into the skin once a  week. 3 mL 1   sucralfate (CARAFATE) 1 g tablet Take 1 tablet (1 g total) by mouth 4 (four) times daily -  with meals and at bedtime. As needed 40 tablet 1   traMADol (ULTRAM) 50 MG tablet Take 1 tablet (50 mg total) by mouth every 12 (twelve) hours as needed for severe pain. 30 tablet 0   No current facility-administered medications for this visit.     Past Medical History:  Diagnosis Date   Aortic atherosclerosis ([place])    Arthritis    Cardiomyopathy ([place])    Hypertension    Internal hemorrhoids    Lupus ([place])    PUD (peptic ulcer disease)    Stroke  ([place]) 08/13/2018   Vertigo     Past Surgical History:  Procedure Laterality Date   BACK SURGERY     CARPAL TUNNEL RELEASE     COLONOSCOPY     KNEE CARTILAGE SURGERY     NECK SURGERY     2015   UPPER GASTROINTESTINAL ENDOSCOPY      Social History   Socioeconomic History   Marital status: Single    Spouse name: Not on file   Number of children: 1   Years of education: 12   Highest education level: Not on file  Occupational History   Occupation: retired    Comment: retired  Tobacco Use   Smoking status: Former    Types: Cigarettes    Quit date: 07/21/2004    Years since quitting: 17.9   Smokeless tobacco: Never  Vaping Use   Vaping Use: Never used  Substance and Sexual Activity   Alcohol use: Not Currently    Comment: occasional   Drug use: No   Sexual activity: Not on file  Other Topics Concern   Not on file  Social History Narrative   Lives one level with son/grandchildren; Left handed; high school grad; retired; walks for exercise; no caffeine   Social Determinants of Health   Financial Resource Strain: Low Risk  (09/06/2021)   Overall Financial Resource Strain (CARDIA)    Difficulty of Paying Living Expenses: Not hard at all  Food Insecurity: No Food Insecurity (12/25/2020)   Hunger Vital Sign    Worried About Running Out of Food in the Last Year: Never true    Ran Out of Food in the Last Year: Never true  Transportation Needs: No Transportation Needs (12/25/2020)   PRAPARE - Hydrologist (Medical): No    Lack of Transportation (Non-Medical): No  Physical Activity: Insufficiently Active (02/21/2021)   Exercise Vital Sign    Days of Exercise per Week: 2 days    Minutes of Exercise per Session: 10 min  Stress: No Stress Concern Present (12/25/2020)   [place]    Feeling of Stress : Not at all  Social Connections: Moderately Isolated (12/25/2020)   Social Connection and  Isolation Panel [NHANES]    Frequency of Communication with Friends and Family: More than three times a week    Frequency of Social Gatherings with Friends and Family: More than three times a week    Attends Religious Services: More than 4 times per year    Active Member of [employer] or Organizations: No    Attends Archivist Meetings: Never    Marital Status: Never married  Intimate Partner Violence: Not At Risk (12/25/2020)   Humiliation, Afraid, Rape, and Kick questionnaire    Fear of Current  or Ex-Partner: No    Emotionally Abused: No    Physically Abused: No    Sexually Abused: No    Family History  Problem Relation Age of Onset   Hypertension Mother    Arthritis Mother    Heart failure Mother    Stroke Mother    Kidney disease Father    Hypertension Father    Colon cancer Neg Hx    Esophageal cancer Neg Hx    Rectal cancer Neg Hx    Stomach cancer Neg Hx     ROS: no fevers or chills, productive cough, hemoptysis, dysphasia, odynophagia, melena, hematochezia, dysuria, hematuria, rash, seizure activity, orthopnea, PND, pedal edema, claudication. Remaining systems are negative.  Physical Exam: Well-developed well-nourished in no acute distress.  Skin is warm and dry.  HEENT is normal.  Neck is supple.  Chest is clear to auscultation with normal expansion.  Cardiovascular exam is irregular Abdominal exam nontender or distended. No masses palpated. Extremities show no edema. neuro grossly intact  ECG-atrial fibrillation at a rate of 99, nonspecific ST changes.  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  Will repeat.  Continue ARB and beta-blocker.  As outlined previously she has a history of CVA which may have been secondary to intracranial vascular disease but may have also been secondary to noncompaction.  Will continue apixaban.  She does not have a family history of sudden death and she has never had  syncope.  2 newly diagnosed persistent atrial fibrillation-patient noted to be in atrial fibrillation November 2023.  Recent TSH November 15 1.90.  This is likely the cause of her increased dyspnea on exertion.  Continue metoprolol for rate control.  Continue apixaban.  She states she has not missed doses.  Will arrange elective cardioversion to restore sinus rhythm.  If she does not hold sinus rhythm and she would likely need an antiarrhythmic versus referral for ablation.  I will have her seen back following her cardioversion.  3 chronic diastolic congestive heart failure-some increased dyspnea on exertion likely secondary to A-fib.  Discontinue HCTZ and treat with Lasix 20 mg daily with an additional 20 mg as needed for worsening shortness of breath or weight gain of 2 to 3 pounds.  Check potassium and renal function 1 week.  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  Continue ARB and beta-blocker.  Repeat echocardiogram.  5 hyperlipidemia-continue statin.  6 prior CVA-continue apixaban as outlined.  Kirk Ruths, MD

## 2022-06-16 NOTE — H&P (View-Only) (Signed)
HPI: FU cardiomyopathy. Nuclear study November 2017 showed no infarct or ischemia.  LV function mildly reduced.  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 Had CVA January 2020. Carotid Dopplers January 2020 showed 1 to 39% bilateral stenosis. Cardiac MRI 8/20 showed EF 44 and findings c/w noncompaction, mild LAE, mild AI and MR.  Last echocardiogram September 2022 showed ejection fraction 50 to 55%, apical trabeculations suggestive of LV noncompaction.  Patient seen in the emergency room recently with complaints of dyspnea.  Troponins were normal.  BNP 100.6.  Hemoglobin 10.8.  CTA November 2023 showed no pulmonary embolus but there was note of small pleural effusions.  Patient felt to have CHF and also URI.  Patient also with newly diagnosed atrial fibrillation.  Since last seen, in the past 1 month she has noticed increased dyspnea on exertion.  She denies orthopnea, PND, pedal edema, chest pain or syncope.  She has had palpitations.  Current Outpatient Medications  Medication Sig Dispense Refill   ACCU-CHEK GUIDE test strip USE UP TO FOUR TIMES DAILY AS DIRECTED 100 strip 0   Accu-Chek Softclix Lancets lancets USE UP TO FOUR TIMES DAILY AS DIRECTED 100 each 0   belimumab (BENLYSTA) 400 MG SOLR injection Inject into the vein every 30 (thirty) days.     Blood Glucose Monitoring Suppl (ACCU-CHEK GUIDE) w/Device KIT USE UP TO FOUR TIMES DAILY AS DIRECTED 1 kit 0   colchicine 0.6 MG tablet Take 1 tablet (0.6 mg total) by mouth 2 (two) times daily. 8 tablet 0   diclofenac Sodium (VOLTAREN) 1 % GEL Apply 4 g topically 4 (four) times daily as needed. 500 g 6   ELIQUIS 5 MG TABS tablet TAKE ONE TABLET BY MOUTH EVERY MORNING and TAKE ONE TABLET BY MOUTH EVERYDAY AT BEDTIME 180 tablet 1   gabapentin (NEURONTIN) 800 MG tablet TAKE ONE TABLET BY MOUTH EVERY MORNING and TAKE ONE CAPSULE BY  MOUTH EVERYDAY AT BEDTIME 60 tablet 1   hydrochlorothiazide (HYDRODIURIL) 25 MG tablet Take 1 tablet (25 mg total) by mouth daily. Please contact the office to schedule appointment for additional refills. 1st Attempt. 30 tablet 0   hydroxychloroquine (PLAQUENIL) 200 MG tablet Take 1 tablet (200 mg total) by mouth 2 (two) times daily. 30 tablet 0   losartan (COZAAR) 50 MG tablet Take 1 tablet (50 mg total) by mouth every morning. 90 tablet 0   meclizine (ANTIVERT) 25 MG tablet TAKE ONE TABLET BY MOUTH twice daily AS NEEDED FOR dizziness 90 tablet 1   metFORMIN (GLUCOPHAGE) 500 MG tablet TAKE ONE TABLET BY MOUTH EVERYDAY AT BEDTIME 90 tablet 3   methocarbamol (ROBAXIN) 500 MG tablet Take 1 tablet (500 mg total) by mouth every 8 (eight) hours as needed for muscle spasms. 30 tablet 0   metoprolol succinate (TOPROL-XL) 50 MG 24 hr tablet Take 1 tablet (50 mg total) by mouth every morning. 30 tablet 0   omeprazole (PRILOSEC) 40 MG capsule TAKE ONE CAPSULE BY MOUTH ONCE DAILY 90 capsule 1   ondansetron (ZOFRAN ODT) 8 MG disintegrating tablet Take 1 tablet (8 mg total) by mouth every 8 (eight) hours as needed for nausea or vomiting. 30 tablet 0   rosuvastatin (CRESTOR) 20 MG tablet TAKE ONE TABLET BY MOUTH EVERYDAY AT BEDTIME 90 tablet 3   Semaglutide,0.25 or 0.5MG/DOS, (OZEMPIC, 0.25 OR 0.5 MG/DOSE,) 2 MG/3ML SOPN Inject 0.5 mg into the skin once a  week. 3 mL 1   sucralfate (CARAFATE) 1 g tablet Take 1 tablet (1 g total) by mouth 4 (four) times daily -  with meals and at bedtime. As needed 40 tablet 1   traMADol (ULTRAM) 50 MG tablet Take 1 tablet (50 mg total) by mouth every 12 (twelve) hours as needed for severe pain. 30 tablet 0   No current facility-administered medications for this visit.     Past Medical History:  Diagnosis Date   Aortic atherosclerosis ([place])    Arthritis    Cardiomyopathy ([place])    Hypertension    Internal hemorrhoids    Lupus ([place])    PUD (peptic ulcer disease)    Stroke  ([place]) 08/13/2018   Vertigo     Past Surgical History:  Procedure Laterality Date   BACK SURGERY     CARPAL TUNNEL RELEASE     COLONOSCOPY     KNEE CARTILAGE SURGERY     NECK SURGERY     2015   UPPER GASTROINTESTINAL ENDOSCOPY      Social History   Socioeconomic History   Marital status: Single    Spouse name: Not on file   Number of children: 1   Years of education: 12   Highest education level: Not on file  Occupational History   Occupation: retired    Comment: retired  Tobacco Use   Smoking status: Former    Types: Cigarettes    Quit date: 07/21/2004    Years since quitting: 17.9   Smokeless tobacco: Never  Vaping Use   Vaping Use: Never used  Substance and Sexual Activity   Alcohol use: Not Currently    Comment: occasional   Drug use: No   Sexual activity: Not on file  Other Topics Concern   Not on file  Social History Narrative   Lives one level with son/grandchildren; Left handed; high school grad; retired; walks for exercise; no caffeine   Social Determinants of Health   Financial Resource Strain: Low Risk  (09/06/2021)   Overall Financial Resource Strain (CARDIA)    Difficulty of Paying Living Expenses: Not hard at all  Food Insecurity: No Food Insecurity (12/25/2020)   Hunger Vital Sign    Worried About Running Out of Food in the Last Year: Never true    Ran Out of Food in the Last Year: Never true  Transportation Needs: No Transportation Needs (12/25/2020)   PRAPARE - Hydrologist (Medical): No    Lack of Transportation (Non-Medical): No  Physical Activity: Insufficiently Active (02/21/2021)   Exercise Vital Sign    Days of Exercise per Week: 2 days    Minutes of Exercise per Session: 10 min  Stress: No Stress Concern Present (12/25/2020)   [place]    Feeling of Stress : Not at all  Social Connections: Moderately Isolated (12/25/2020)   Social Connection and  Isolation Panel [NHANES]    Frequency of Communication with Friends and Family: More than three times a week    Frequency of Social Gatherings with Friends and Family: More than three times a week    Attends Religious Services: More than 4 times per year    Active Member of [employer] or Organizations: No    Attends Archivist Meetings: Never    Marital Status: Never married  Intimate Partner Violence: Not At Risk (12/25/2020)   Humiliation, Afraid, Rape, and Kick questionnaire    Fear of Current  or Ex-Partner: No    Emotionally Abused: No    Physically Abused: No    Sexually Abused: No    Family History  Problem Relation Age of Onset   Hypertension Mother    Arthritis Mother    Heart failure Mother    Stroke Mother    Kidney disease Father    Hypertension Father    Colon cancer Neg Hx    Esophageal cancer Neg Hx    Rectal cancer Neg Hx    Stomach cancer Neg Hx     ROS: no fevers or chills, productive cough, hemoptysis, dysphasia, odynophagia, melena, hematochezia, dysuria, hematuria, rash, seizure activity, orthopnea, PND, pedal edema, claudication. Remaining systems are negative.  Physical Exam: Well-developed well-nourished in no acute distress.  Skin is warm and dry.  HEENT is normal.  Neck is supple.  Chest is clear to auscultation with normal expansion.  Cardiovascular exam is irregular Abdominal exam nontender or distended. No masses palpated. Extremities show no edema. neuro grossly intact  ECG-atrial fibrillation at a rate of 99, nonspecific ST changes.  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  Will repeat.  Continue ARB and beta-blocker.  As outlined previously she has a history of CVA which may have been secondary to intracranial vascular disease but may have also been secondary to noncompaction.  Will continue apixaban.  She does not have a family history of sudden death and she has never had  syncope.  2 newly diagnosed persistent atrial fibrillation-patient noted to be in atrial fibrillation November 2023.  Recent TSH November 15 1.90.  This is likely the cause of her increased dyspnea on exertion.  Continue metoprolol for rate control.  Continue apixaban.  She states she has not missed doses.  Will arrange elective cardioversion to restore sinus rhythm.  If she does not hold sinus rhythm and she would likely need an antiarrhythmic versus referral for ablation.  I will have her seen back following her cardioversion.  3 chronic diastolic congestive heart failure-some increased dyspnea on exertion likely secondary to A-fib.  Discontinue HCTZ and treat with Lasix 20 mg daily with an additional 20 mg as needed for worsening shortness of breath or weight gain of 2 to 3 pounds.  Check potassium and renal function 1 week.  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  Continue ARB and beta-blocker.  Repeat echocardiogram.  5 hyperlipidemia-continue statin.  6 prior CVA-continue apixaban as outlined.  Kirk Ruths, MD

## 2022-06-17 ENCOUNTER — Ambulatory Visit: Payer: Medicare Other | Attending: Cardiology | Admitting: Cardiology

## 2022-06-17 ENCOUNTER — Encounter: Payer: Self-pay | Admitting: Cardiology

## 2022-06-17 VITALS — BP 110/82 | HR 99 | Ht 63.0 in | Wt 188.0 lb

## 2022-06-17 DIAGNOSIS — E785 Hyperlipidemia, unspecified: Secondary | ICD-10-CM

## 2022-06-17 DIAGNOSIS — I428 Other cardiomyopathies: Secondary | ICD-10-CM | POA: Diagnosis not present

## 2022-06-17 DIAGNOSIS — I4819 Other persistent atrial fibrillation: Secondary | ICD-10-CM | POA: Diagnosis not present

## 2022-06-17 DIAGNOSIS — I5032 Chronic diastolic (congestive) heart failure: Secondary | ICD-10-CM | POA: Diagnosis not present

## 2022-06-17 MED ORDER — FUROSEMIDE 20 MG PO TABS: ORAL_TABLET | ORAL | 3 refills | Status: DC

## 2022-06-17 NOTE — Patient Instructions (Signed)
Medication Instructions:   STOP HCTZ  START FUROSEMIDE 20 MG ONCE DAILY AND MAY TAKE EXTRA TABLET AS NEEDED FOR SHORTNESS OF BREATH OR WEIGHT GAIN  *If you need a refill on your cardiac medications before your next appointment, please call your pharmacy*   Lab Work:  Your physician recommends that you return for lab work in: ONE WEEK-DO NOT NEED TO FAST  If you have labs (blood work) drawn today and your tests are completely normal, you will receive your results only by: [place] (if you have MyChart) OR A paper copy in the mail If you have any lab test that is abnormal or we need to change your treatment, we will call you to review the results.   Testing/Procedures:  Your physician has requested that you have an echocardiogram. Echocardiography is a painless test that uses sound waves to create images of your heart. It provides your doctor with information about the size and shape of your heart and how well your heart's chambers and valves are working. This procedure takes approximately one hour. There are no restrictions for this procedure. Please do NOT wear cologne, perfume, aftershave, or lotions (deodorant is allowed). Please arrive 15 minutes prior to your appointment time. [place], you and your health needs are our priority.  As part of our continuing mission to provide you with exceptional heart care, we have created designated Provider Care Teams.  These Care Teams include your primary Cardiologist (physician) and Advanced Practice Providers (APPs -  Physician Assistants and Nurse Practitioners) who all work together to provide you with the care you need, when you need it.  We recommend signing up for the patient portal called "MyChart".  Sign up information is provided on this After Visit Summary.  MyChart is used to connect with patients for Virtual Visits (Telemedicine).  Patients are able to view lab/test  results, encounter notes, upcoming appointments, etc.  Non-urgent messages can be sent to your provider as well.   To learn more about what you can do with MyChart, go to [email or web address].    Your next appointment:   6 week(s)  The format for your next appointment:   In Person  Provider:   Sande Rives, PA-C, Almyra Deforest, PA-C, or Diona Browner, NP    Then, Kirk Ruths, MD will plan to see you again in 6 month(s).

## 2022-06-18 ENCOUNTER — Encounter: Payer: Self-pay | Admitting: *Deleted

## 2022-06-18 ENCOUNTER — Telehealth: Payer: Self-pay | Admitting: Cardiology

## 2022-06-18 ENCOUNTER — Other Ambulatory Visit: Payer: Self-pay | Admitting: *Deleted

## 2022-06-18 DIAGNOSIS — I4819 Other persistent atrial fibrillation: Secondary | ICD-10-CM

## 2022-06-18 NOTE — Telephone Encounter (Signed)
Pt is requesting call back to discuss Eliquis and something pertaining to her job. She says she was told to call today after her appt yesterday. Gave no further information.

## 2022-06-18 NOTE — Telephone Encounter (Signed)
Returned call to pt she states that Dr Stanford Breed told her to call back and tell him how she is taking her Eliquis, she is taking it BID. She called her boss to discuss her appt yesterday and he told her she needs to apply for short term/temporary disability due to SOB/DOE and her AFIB, and taking time off work for this. Discussed further with pt, she will have her employer fax any forms she needs [place] to fill out. I do not see that Dr Stanford Breed told her to be off work. She wants/thinks she needs to be off work until the ECHO and the Cardioversion are done.

## 2022-06-18 NOTE — Telephone Encounter (Signed)
Spoke with pt, aware can be out until DCCV 07/03/22.

## 2022-06-20 NOTE — Telephone Encounter (Signed)
Dr Fuller Plan,  Juluis Rainier... This patient was originally on the schedule for EGD with you but has since cancelled as she is scheduled for cardioversoin 07/03/22. She states that she will call back to reschedule at a later time. Just wanted to make you aware.

## 2022-06-27 LAB — BASIC METABOLIC PANEL
BUN/Creatinine Ratio: 16 (ref 12–28)
BUN: 17 mg/dL (ref 8–27)
CO2: 25 mmol/L (ref 20–29)
Calcium: 9.4 mg/dL (ref 8.7–10.3)
Chloride: 104 mmol/L (ref 96–106)
Creatinine, Ser: 1.06 mg/dL — ABNORMAL HIGH (ref 0.57–1.00)
Glucose: 87 mg/dL (ref 70–99)
Potassium: 4.9 mmol/L (ref 3.5–5.2)
Sodium: 145 mmol/L — ABNORMAL HIGH (ref 134–144)
eGFR: 58 mL/min/{1.73_m2} — ABNORMAL LOW (ref >59)

## 2022-06-27 LAB — CBC
Hematocrit: 37.8 % (ref 34.0–46.6)
Hemoglobin: 12 g/dL (ref 11.1–15.9)
MCH: 27.5 pg (ref 26.6–33.0)
MCHC: 31.7 g/dL (ref 31.5–35.7)
MCV: 87 fL (ref 79–97)
Platelets: 310 10*3/uL (ref 150–450)
RBC: 4.36 x10E6/uL (ref 3.77–5.28)
RDW: 14.1 % (ref 11.7–15.4)
WBC: 6.7 10*3/uL (ref 3.4–10.8)

## 2022-07-02 ENCOUNTER — Encounter: Payer: Self-pay | Admitting: *Deleted

## 2022-07-02 ENCOUNTER — Other Ambulatory Visit: Payer: Self-pay | Admitting: *Deleted

## 2022-07-02 DIAGNOSIS — I4819 Other persistent atrial fibrillation: Secondary | ICD-10-CM

## 2022-07-02 NOTE — Anesthesia Preprocedure Evaluation (Addendum)
Anesthesia Evaluation  Patient identified by MRN, date of birth, ID band Patient awake    Reviewed: Allergy & Precautions, Patient's Chart, lab work & pertinent test results  Airway Mallampati: II       Dental   Pulmonary former smoker   breath sounds clear to auscultation       Cardiovascular hypertension, + dysrhythmias Atrial Fibrillation  Rhythm:Irregular Rate:Abnormal  No compaction cardiomyopathy TTE 03/2021   1. Left ventricular ejection fraction, by estimation, is 50 to 55%. The  left ventricle has low normal function. The left ventricle has no regional  wall motion abnormalities. Left ventricular diastolic parameters were  normal. There are excessive apical  trabeculations on the apical four chamber view suggestive of LV  non-compaction; short axis assessment not sufficient for Chin/Jenni  criteria evaluation of left ventricular non-compaction.   2. Right ventricular systolic function is normal. The right ventricular  size is normal. There is normal pulmonary artery systolic pressure.   3. The mitral valve is grossly normal. Trivial mitral valve  regurgitation. No evidence of mitral stenosis.   4. The aortic valve is tricuspid. Aortic valve regurgitation is not  visualized. No aortic stenosis is present.   5. The inferior vena cava is normal in size with greater than 50%  respiratory variability, suggesting right atrial pressure of 3 mmHg.   Comparison(s): A prior study was performed on 02/22/2020. Prior images  reviewed side by side. 02/22/20 EF 40-45%.     Neuro/Psych CVA    GI/Hepatic Neg liver ROS, PUD,,,  Endo/Other  diabetes    Renal/GU      Musculoskeletal  (+) Arthritis ,    Abdominal   Peds  Hematology   Anesthesia Other Findings All: codeine  Reproductive/Obstetrics                             Anesthesia Physical Anesthesia Plan  ASA: 3  Anesthesia Plan: General    Post-op Pain Management:    Induction: Intravenous  PONV Risk Score and Plan: Treatment may vary due to age or medical condition  Airway Management Planned: Natural Airway and Nasal Cannula  Additional Equipment: None  Intra-op Plan:   Post-operative Plan:   Informed Consent:      Dental advisory given  Plan Discussed with:   Anesthesia Plan Comments: (Pt on ozempic)        Anesthesia Quick Evaluation

## 2022-07-03 ENCOUNTER — Other Ambulatory Visit: Payer: Self-pay

## 2022-07-03 ENCOUNTER — Ambulatory Visit (HOSPITAL_COMMUNITY): Payer: Medicare Other | Admitting: Anesthesiology

## 2022-07-03 ENCOUNTER — Encounter: Payer: Medicare Other | Admitting: Gastroenterology

## 2022-07-03 ENCOUNTER — Ambulatory Visit (HOSPITAL_COMMUNITY)
Admission: RE | Admit: 2022-07-03 | Discharge: 2022-07-03 | Disposition: A | Discharge status: Home / Self Care | Payer: Medicare Other | Source: Ambulatory Visit | Attending: Cardiology | Admitting: Cardiology

## 2022-07-03 ENCOUNTER — Encounter: Payer: Self-pay | Admitting: *Deleted

## 2022-07-03 ENCOUNTER — Encounter (HOSPITAL_COMMUNITY): Payer: Self-pay | Admitting: Cardiology

## 2022-07-03 ENCOUNTER — Encounter (HOSPITAL_COMMUNITY)
Admission: RE | Disposition: A | Discharge status: Home / Self Care | Payer: Self-pay | Source: Ambulatory Visit | Attending: Cardiology

## 2022-07-03 ENCOUNTER — Ambulatory Visit (HOSPITAL_BASED_OUTPATIENT_CLINIC_OR_DEPARTMENT_OTHER): Payer: Medicare Other | Admitting: Anesthesiology

## 2022-07-03 DIAGNOSIS — E119 Type 2 diabetes mellitus without complications: Secondary | ICD-10-CM

## 2022-07-03 DIAGNOSIS — Z7984 Long term (current) use of oral hypoglycemic drugs: Secondary | ICD-10-CM | POA: Insufficient documentation

## 2022-07-03 DIAGNOSIS — I5032 Chronic diastolic (congestive) heart failure: Secondary | ICD-10-CM | POA: Diagnosis not present

## 2022-07-03 DIAGNOSIS — Z79899 Other long term (current) drug therapy: Secondary | ICD-10-CM | POA: Diagnosis not present

## 2022-07-03 DIAGNOSIS — Z7901 Long term (current) use of anticoagulants: Secondary | ICD-10-CM | POA: Diagnosis not present

## 2022-07-03 DIAGNOSIS — I4891 Unspecified atrial fibrillation: Secondary | ICD-10-CM | POA: Diagnosis not present

## 2022-07-03 DIAGNOSIS — I10 Essential (primary) hypertension: Secondary | ICD-10-CM

## 2022-07-03 DIAGNOSIS — I4819 Other persistent atrial fibrillation: Secondary | ICD-10-CM

## 2022-07-03 DIAGNOSIS — E785 Hyperlipidemia, unspecified: Secondary | ICD-10-CM | POA: Insufficient documentation

## 2022-07-03 DIAGNOSIS — I429 Cardiomyopathy, unspecified: Secondary | ICD-10-CM | POA: Diagnosis not present

## 2022-07-03 DIAGNOSIS — Z8673 Personal history of transient ischemic attack (TIA), and cerebral infarction without residual deficits: Secondary | ICD-10-CM | POA: Insufficient documentation

## 2022-07-03 DIAGNOSIS — Z87891 Personal history of nicotine dependence: Secondary | ICD-10-CM

## 2022-07-03 DIAGNOSIS — M1990 Unspecified osteoarthritis, unspecified site: Secondary | ICD-10-CM | POA: Diagnosis not present

## 2022-07-03 DIAGNOSIS — I110 Hypertensive heart disease with heart failure: Secondary | ICD-10-CM | POA: Insufficient documentation

## 2022-07-03 HISTORY — PX: CARDIOVERSION: SHX1299

## 2022-07-03 LAB — GLUCOSE, CAPILLARY: Glucose-Capillary: 103 mg/dL — ABNORMAL HIGH (ref 70–99)

## 2022-07-03 SURGERY — CARDIOVERSION
Anesthesia: General

## 2022-07-03 MED ORDER — SODIUM CHLORIDE 0.9 % IV SOLN: INTRAVENOUS | Status: DC

## 2022-07-03 MED ORDER — PROPOFOL 10 MG/ML IV BOLUS
INTRAVENOUS | Status: DC | PRN
  Administered 2022-07-03: 80 mg via INTRAVENOUS

## 2022-07-03 MED ORDER — LIDOCAINE 2% (20 MG/ML) 5 ML SYRINGE
INTRAMUSCULAR | Status: DC | PRN
  Administered 2022-07-03: 40 mg via INTRAVENOUS

## 2022-07-03 NOTE — Anesthesia Postprocedure Evaluation (Signed)
Anesthesia Post Note  Patient: Amanda Saunders  Procedure(s) Performed: CARDIOVERSION     Patient location during evaluation: Endoscopy Anesthesia Type: General Level of consciousness: awake Pain management: pain level controlled Vital Signs Assessment: post-procedure vital signs reviewed and stable Respiratory status: spontaneous breathing Cardiovascular status: stable Postop Assessment: no apparent nausea or vomiting Anesthetic complications: no   No notable events documented.  Last Vitals:  Vitals:   07/03/22 0850 07/03/22 0859  BP: 115/76 120/75  Pulse: 84 82  Resp: (!) 25 20  Temp:    SpO2: 97% 96%    Last Pain:  Vitals:   07/03/22 0859  TempSrc:   PainSc: 0-No pain                 Radwan Cowley

## 2022-07-03 NOTE — Discharge Instructions (Signed)

## 2022-07-03 NOTE — CV Procedure (Signed)
Procedure:   DCCV  Indication:  Symptomatic atrial fibrillation  Procedure Note:  The patient signed informed consent.  They have had had therapeutic anticoagulation with Eliquis greater than 3 weeks.  Anesthesia was administered by Dr. Nyoka Cowden.  Adequate airway was maintained throughout and vital followed per protocol.  They were cardioverted x 1 with 200J of biphasic synchronized energy.  They converted to NSR with rate 80s.  There were no apparent complications.  The patient had normal neuro status and respiratory status post procedure with vitals stable as recorded elsewhere.    Follow up:  They will continue on current medical therapy and follow up with cardiology as scheduled.  Oswaldo Milian, MD 07/03/2022 8:35 AM

## 2022-07-03 NOTE — Anesthesia Postprocedure Evaluation (Signed)
Anesthesia Post Note  Patient: Amanda Saunders  Procedure(s) Performed: CARDIOVERSION     Patient location during evaluation: Endoscopy Anesthesia Type: General Level of consciousness: awake Pain management: pain level controlled Vital Signs Assessment: post-procedure vital signs reviewed and stable Respiratory status: spontaneous breathing Cardiovascular status: stable Postop Assessment: no apparent nausea or vomiting Anesthetic complications: no   No notable events documented.  Last Vitals:  Vitals:   07/03/22 0850 07/03/22 0859  BP: 115/76 120/75  Pulse: 84 82  Resp: (!) 25 20  Temp:    SpO2: 97% 96%    Last Pain:  Vitals:   07/03/22 0859  TempSrc:   PainSc: 0-No pain                 Layne Lebon

## 2022-07-03 NOTE — Interval H&P Note (Signed)
History and Physical Interval Note:  07/03/2022 8:21 AM  Amanda Saunders  has presented today for surgery, with the diagnosis of AFRIB.  The various methods of treatment have been discussed with the patient and family. After consideration of risks, benefits and other options for treatment, the patient has consented to  Procedure(s): CARDIOVERSION (N/A) as a surgical intervention.  The patient's history has been reviewed, patient examined, no change in status, stable for surgery.  I have reviewed the patient's chart and labs.  Questions were answered to the patient's satisfaction.     Donato Heinz

## 2022-07-03 NOTE — Transfer of Care (Signed)
Immediate Anesthesia Transfer of Care Note  Patient: Amanda Saunders  Procedure(s) Performed: CARDIOVERSION  Patient Location: Endoscopy Unit  Anesthesia Type:General  Level of Consciousness: drowsy and patient cooperative  Airway & Oxygen Therapy: Patient Spontanous Breathing and Patient connected to face mask oxygen  Post-op Assessment: Report given to RN and Post -op Vital signs reviewed and stable  Post vital signs: Reviewed and stable  Last Vitals:  Vitals Value Taken Time  BP 116/77   Temp    Pulse 87   Resp 20   SpO2 97%     Last Pain:  Vitals:   07/03/22 0740  TempSrc: Temporal  PainSc: 0-No pain         Complications: No notable events documented.

## 2022-07-04 ENCOUNTER — Other Ambulatory Visit: Payer: Self-pay | Admitting: Neurology

## 2022-07-04 ENCOUNTER — Telehealth: Payer: Self-pay | Admitting: Cardiology

## 2022-07-04 NOTE — Telephone Encounter (Signed)
Returned call to patient who states that she needs her paper work faxed by Monday as the deadline is Monday. Advised patient that per chart review the paper work has been faxed over. Advised patient that I will forward to Debra RN to make sure that this was faxed. Advised patient we can refax on Monday if needed. Patient aware and verbalized understanding.

## 2022-07-04 NOTE — Telephone Encounter (Signed)
Pt would like to confirm what fax number her accomodation form was sent to. Per [employer], it was faxed yesterday

## 2022-07-06 ENCOUNTER — Encounter (HOSPITAL_COMMUNITY): Payer: Self-pay | Admitting: Cardiology

## 2022-07-07 NOTE — Progress Notes (Deleted)
NEUROLOGY FOLLOW UP OFFICE NOTE  Amanda Saunders [id number]  Assessment/Plan:   Headache with disequilibrium - recurrence of habitual headache.   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   UPDATE: Last seen in March.  Titrated gabapentin up to '800mg'$  twice daily  Hospitalized last week for *** atrial fibrillation.  ***   Current NSAIDs:  ASA '81mg'$ , Motrin Current analgesic:  Tylenol Current muscle relaxant:  none Current antihypertensive:  HCTZ, losartan, Toprol XL Current antiepileptic:  gabapentin '800mg'$  twice daily Current antihistamine:  Meclizine Other medication:  Eliquis   HISTORY: Headache: On 03/03/19, she developed a sudden severe pounding right sided headache (radiating across forehead) with right facial burning.  There was associated nausea, sometimes vomiting, photophobia and phonophobia.  No visual disturbance.  She felt that her equilibrium was off.  She went to the ED on 03/11/19 for further evaluation. CT head personally reviewed and negative for acute intracranial abnormality such as hemorrhage or infarct.  CBC and BMP were unremarkable.  She was given a headache cocktail of Reglan and Benadryl and discharged home.  Headaches are still present but not as severe.  She reported temperature of 99.  They lessened on 8/24.  Headache is mild to moderate, lasting a couple of hours off and on, daily.  Sometimes she feels shaking in her head.  Reports a little bit of neck pain.     04/17/2019 MRI w wo and MRA of head: advanced widespread chronic small vessel ischemic changes as well as occlusion of right distal M1 segment and severe stenoses of left M2 branches, A2 branches and mild irregularity of the PCA branches, but no acute intracranial abnormality. 04/13/2019 Sed  Rate 11.   History of CVA: She was admitted to [facility] on 08/14/18 for increased left arm numbness and weakness with left sided tremor as well as headache, dizziness with nausea and vomiting.  CT of head was personally reviewed and showed no acute findings.  MRI of brain personally reviewed and demonstrated scattered acute watershed subcortical and small cortical infarcts in the right MCA/ACA and MCA/PCA areas.  MRA of head personally reviewed showed severe stenosis or short segment occlusion of the distal right MCA M1 and proximal M2 segments as well as proximal severe stenosis or short segment occlusion of left MCA M2 segment.  Carotid doppler showed no hemodynamically significant stenosis.  2D echocardiogram showed EF 45-50%.  LDL was 77.  Hgb A1c was 6.2.  ASA '81mg'$  daily was switched to ASA '325mg'$  and Plavix '75mg'$  daily for 3 months with plan to subsequently continue Plavix alone.  She was continued on atorvastatin '40mg'$  daily.     Past medication:  tramadol, naproxen, Robaxin  PAST MEDICAL HISTORY: Past Medical History:  Diagnosis Date   Cardiomyopathy ([place])    Hypertension    Lupus ([place])    PUD (peptic ulcer disease)    Stroke ([place]) 08/13/2018   Vertigo     MEDICATIONS: Current Outpatient Medications on File Prior to Visit  Medication Sig Dispense Refill   diclofenac Sodium (VOLTAREN) 1 % GEL Apply 4 g topically 4 (four) times daily as needed. 500 g 6   ELIQUIS 5 MG TABS tablet TAKE ONE TABLET BY MOUTH EVERY MORNING and TAKE ONE TABLET BY MOUTH AT BEDTIME 180 tablet 1  gabapentin (NEURONTIN) 600 MG tablet TAKE ONE TABLET BY MOUTH EVERY MORNING and TAKE ONE TABLET BY MOUTH EVERYDAY AT BEDTIME 60 tablet 2   hydrochlorothiazide (HYDRODIURIL) 25 MG tablet Take 1 tablet (25 mg total) by mouth daily. 90 tablet 3   hydroxychloroquine (PLAQUENIL) 200 MG tablet Take 1 tablet (200 mg total) by mouth 2 (two) times daily. 30 tablet 0   losartan (COZAAR) 50 MG tablet TAKE ONE TABLET BY  MOUTH EVERY MORNING 90 tablet 3   meclizine (ANTIVERT) 25 MG tablet TAKE ONE TABLET BY MOUTH twice daily AS NEEDED FOR dizziness (Patient not taking: Reported on 09/23/2021) 90 tablet 1   metFORMIN (GLUCOPHAGE) 500 MG tablet Take 0.5-1 tablets (250-500 mg total) by mouth at bedtime. 90 tablet 3   metoprolol succinate (TOPROL-XL) 25 MG 24 hr tablet TAKE ONE TABLET BY MOUTH EVERY MORNING 90 tablet 3   omeprazole (PRILOSEC) 40 MG capsule TAKE ONE CAPSULE BY MOUTH ONCE DAILY 90 capsule 1   ondansetron (ZOFRAN ODT) 8 MG disintegrating tablet Take 1 tablet (8 mg total) by mouth every 8 (eight) hours as needed for nausea or vomiting. 10 tablet 0   rosuvastatin (CRESTOR) 20 MG tablet TAKE ONE TABLET BY MOUTH EVERYDAY AT BEDTIME 90 tablet 3   sucralfate (CARAFATE) 1 g tablet TAKE ONE TABLET BY MOUTH FOUR TIMES DAILY with meals AND AT BEDTIME 40 tablet 3   traMADol (ULTRAM) 50 MG tablet Take 1 tablet (50 mg total) by mouth every 8 (eight) hours as needed. 30 tablet 0   No current facility-administered medications on file prior to visit.    ALLERGIES: Allergies  Allergen Reactions   Codeine Nausea And Vomiting    FAMILY HISTORY: Family History  Problem Relation Age of Onset   Hypertension Mother    Arthritis Mother    Heart failure Mother    Stroke Mother    Kidney disease Father    Hypertension Father       Objective:  *** General: No acute distress.  Patient appears well-groomed.   Head:  Normocephalic/atraumatic Eyes:  Fundi examined but not visualized Neck: supple, no paraspinal tenderness, full range of motion Heart:  Regular rate and rhythm Neurological Exam: alert and oriented to person, place, and time.  Speech fluent and not dysarthric, language intact.  CN II-XII intact. Bulk and tone normal, muscle strength 5/5 throughout.  Sensation to light touch intact.  Deep tendon reflexes 2+ throughout.  Finger to nose testing intact.  Gait normal, Romberg with sway.   Metta Clines,  DO  CC: Lamar Blinks, MD

## 2022-07-08 ENCOUNTER — Ambulatory Visit (HOSPITAL_COMMUNITY): Payer: Medicare Other | Attending: Cardiology

## 2022-07-08 DIAGNOSIS — I4819 Other persistent atrial fibrillation: Secondary | ICD-10-CM | POA: Diagnosis not present

## 2022-07-08 DIAGNOSIS — I428 Other cardiomyopathies: Secondary | ICD-10-CM

## 2022-07-08 LAB — ECHOCARDIOGRAM COMPLETE
Area-P 1/2: 3.26 cm2
S' Lateral: 3 cm

## 2022-07-09 ENCOUNTER — Ambulatory Visit: Payer: Medicare Other | Admitting: Neurology

## 2022-07-11 ENCOUNTER — Encounter: Payer: Self-pay | Admitting: *Deleted

## 2022-07-15 NOTE — Progress Notes (Unsigned)
NEUROLOGY FOLLOW UP OFFICE NOTE  Amanda Saunders [id number]  Assessment/Plan:   Headache with disequilibrium - recurrence of habitual headache.   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   UPDATE: Last seen in March.  Titrated gabapentin up to 820m twice daily  Hospitalized last week for *** atrial fibrillation.  ***   Current NSAIDs:  ASA 863m Motrin Current analgesic:  Tylenol Current muscle relaxant:  none Current antihypertensive:  HCTZ, losartan, Toprol XL Current antiepileptic:  gabapentin 80046mwice daily Current antihistamine:  Meclizine Other medication:  Eliquis   HISTORY: Headache: On 03/03/19, she developed a sudden severe pounding right sided headache (radiating across forehead) with right facial burning.  There was associated nausea, sometimes vomiting, photophobia and phonophobia.  No visual disturbance.  She felt that her equilibrium was off.  She went to the ED on 03/11/19 for further evaluation. CT head personally reviewed and negative for acute intracranial abnormality such as hemorrhage or infarct.  CBC and BMP were unremarkable.  She was given a headache cocktail of Reglan and Benadryl and discharged home.  Headaches are still present but not as severe.  She reported temperature of 99.  They lessened on 8/24.  Headache is mild to moderate, lasting a couple of hours off and on, daily.  Sometimes she feels shaking in her head.  Reports a little bit of neck pain.     04/17/2019 MRI w wo and MRA of head: advanced widespread chronic small vessel ischemic changes as well as occlusion of right distal M1 segment and severe stenoses of left M2 branches, A2 branches and mild irregularity of the PCA branches, but no acute intracranial abnormality. 04/13/2019 Sed  Rate 11.   History of CVA: [facility] for increased left arm numbness and weakness with left sided tremor as well as headache, dizziness with nausea and vomiting.  CT of head was personally reviewed and showed no acute findings.  MRI of brain personally reviewed and demonstrated scattered acute watershed subcortical and small cortical infarcts in the right MCA/ACA and MCA/PCA areas.  MRA of head personally reviewed showed severe stenosis or short segment occlusion of the distal right MCA M1 and proximal M2 segments as well as proximal severe stenosis or short segment occlusion of left MCA M2 segment.  Carotid doppler showed no hemodynamically significant stenosis.  2D echocardiogram showed EF 45-50%.  LDL was 77.  Hgb A1c was 6.2.  ASA 58m18mily was switched to ASA 325mg15m Plavix 75mg 6my for 3 months with plan to subsequently continue Plavix alone.  She was continued on atorvastatin 40mg d14m.     Past medication:  tramadol, naproxen, Robaxin  PAST MEDICAL HISTORY: Past Medical History:  Diagnosis Date   Aortic atherosclerosis ([facility])   [place]    Cardiomyopathy ([facility])   [place]    Internal hemorrhoids    Lupus ([facility])   [place]pe[place] disease[place]    Stroke ([facility]) 01[place]/2020   Vertigo     MEDICATIONS: Current Outpatient Medications on File Prior to Visit  Medication Sig Dispense Refill   ACCU-CHEK GUIDE test strip USE UP TO FOUR TIMES DAILY AS DIRECTED 100 strip 0   Accu-Chek Softclix Lancets lancets USE UP TO FOUR TIMES DAILY AS DIRECTED 100 each  0   acetaminophen (TYLENOL) 650 MG CR tablet Take 1,300 mg by mouth every 8 (eight) hours as needed for pain.     belimumab (BENLYSTA) 400 MG SOLR injection Inject into the vein every 30 (thirty) days.     Blood Glucose Monitoring Suppl (ACCU-CHEK GUIDE) w/Device KIT USE UP TO FOUR TIMES DAILY AS DIRECTED 1 kit 0   Cholecalciferol (VITAMIN D-3) 125 MCG (5000 UT) TABS Take 5,000 Units by mouth  daily.     colchicine 0.6 MG tablet Take 1 tablet (0.6 mg total) by mouth 2 (two) times daily. (Patient not taking: Reported on 07/01/2022) 8 tablet 0   diclofenac Sodium (VOLTAREN) 1 % GEL Apply 4 g topically 4 (four) times daily as needed. 500 g 6   ELIQUIS 5 MG TABS tablet TAKE ONE TABLET BY MOUTH EVERY MORNING and TAKE ONE TABLET BY MOUTH EVERYDAY AT BEDTIME 180 tablet 1   furosemide (LASIX) 20 MG tablet Take 1 tablet once daily and may take an extra tablet as needed for shortness of breath or weight gain 180 tablet 3   gabapentin (NEURONTIN) 800 MG tablet TAKE ONE TABLET BY MOUTH EVERY MORNING and TAKE ONE TABLET BY MOUTH EVERYDAY AT BEDTIME 60 tablet 0   hydroxychloroquine (PLAQUENIL) 200 MG tablet Take 1 tablet (200 mg total) by mouth 2 (two) times daily. 30 tablet 0   losartan (COZAAR) 50 MG tablet Take 1 tablet (50 mg total) by mouth every morning. 90 tablet 0   meclizine (ANTIVERT) 25 MG tablet TAKE ONE TABLET BY MOUTH twice daily AS NEEDED FOR dizziness 90 tablet 1   metFORMIN (GLUCOPHAGE) 500 MG tablet TAKE ONE TABLET BY MOUTH EVERYDAY AT BEDTIME 90 tablet 3   methocarbamol (ROBAXIN) 500 MG tablet Take 1 tablet (500 mg total) by mouth every 8 (eight) hours as needed for muscle spasms. (Patient not taking: Reported on 07/01/2022) 30 tablet 0   metoprolol succinate (TOPROL-XL) 50 MG 24 hr tablet Take 1 tablet (50 mg total) by mouth every morning. 30 tablet 0   omeprazole (PRILOSEC) 40 MG capsule TAKE ONE CAPSULE BY MOUTH ONCE DAILY 90 capsule 1   ondansetron (ZOFRAN ODT) 8 MG disintegrating tablet Take 1 tablet (8 mg total) by mouth every 8 (eight) hours as needed for nausea or vomiting. 30 tablet 0   rosuvastatin (CRESTOR) 20 MG tablet TAKE ONE TABLET BY MOUTH EVERYDAY AT BEDTIME 90 tablet 3   Semaglutide,0.25 or 0.5MG/DOS, (OZEMPIC, 0.25 OR 0.5 MG/DOSE,) 2 MG/3ML SOPN Inject 0.5 mg into the skin once a week. 3 mL 1   sucralfate (CARAFATE) 1 g tablet Take 1 tablet (1 g total) by mouth 4  (four) times daily -  with meals and at bedtime. As needed 40 tablet 1   traMADol (ULTRAM) 50 MG tablet Take 1 tablet (50 mg total) by mouth every 12 (twelve) hours as needed for severe pain. (Patient not taking: Reported on 07/01/2022) 30 tablet 0   Vitamin A 2400 MCG (8000 UT) CAPS Take 2,400 Units by mouth daily.     No current facility-administered medications on file prior to visit.    ALLERGIES: Allergies  Allergen Reactions   Codeine Nausea And Vomiting and Other (See Comments)    FAMILY HISTORY: Family History  Problem Relation Age of Onset   Hypertension Mother    Arthritis Mother    Heart failure Mother    Stroke Mother    Kidney disease Father    Hypertension Father    Colon cancer Neg  Hx    Esophageal cancer Neg Hx    Rectal cancer Neg Hx    Stomach cancer Neg Hx       Objective:  *** General: No acute distress.  Patient appears well-groomed.   Head:  Normocephalic/atraumatic Eyes:  Fundi examined but not visualized Neck: supple, no paraspinal tenderness, full range of motion Heart:  Regular rate and rhythm Neurological Exam: alert and oriented to person, place, and time.  Speech fluent and not dysarthric, language intact.  CN II-XII intact. Bulk and tone normal, muscle strength 5/5 throughout.  Sensation to light touch intact.  Deep tendon reflexes 2+ throughout.  Finger to nose testing intact.  Gait normal, Romberg with sway.   Metta Clines, DO  CC: Lamar Blinks, MD

## 2022-07-16 ENCOUNTER — Ambulatory Visit (INDEPENDENT_AMBULATORY_CARE_PROVIDER_SITE_OTHER): Payer: Medicare Other | Admitting: Neurology

## 2022-07-16 ENCOUNTER — Encounter: Payer: Self-pay | Admitting: Neurology

## 2022-07-16 VITALS — BP 116/76 | HR 81 | Ht 63.0 in | Wt 185.6 lb

## 2022-07-16 DIAGNOSIS — R519 Headache, unspecified: Secondary | ICD-10-CM | POA: Diagnosis not present

## 2022-07-16 DIAGNOSIS — I63511 Cerebral infarction due to unspecified occlusion or stenosis of right middle cerebral artery: Secondary | ICD-10-CM | POA: Diagnosis not present

## 2022-07-16 DIAGNOSIS — I10 Essential (primary) hypertension: Secondary | ICD-10-CM

## 2022-07-16 DIAGNOSIS — I6529 Occlusion and stenosis of unspecified carotid artery: Secondary | ICD-10-CM | POA: Diagnosis not present

## 2022-07-16 DIAGNOSIS — E785 Hyperlipidemia, unspecified: Secondary | ICD-10-CM

## 2022-07-16 MED ORDER — GABAPENTIN 800 MG PO TABS: ORAL_TABLET | ORAL | 3 refills | Status: DC

## 2022-07-24 ENCOUNTER — Encounter: Payer: Self-pay | Admitting: Family Medicine

## 2022-07-24 ENCOUNTER — Telehealth: Payer: Self-pay | Admitting: Orthopaedic Surgery

## 2022-07-24 DIAGNOSIS — E119 Type 2 diabetes mellitus without complications: Secondary | ICD-10-CM

## 2022-07-24 MED ORDER — SEMAGLUTIDE (1 MG/DOSE) 4 MG/3ML ~~LOC~~ SOPN: 1.0000 mg | PEN_INJECTOR | SUBCUTANEOUS | 1 refills | Status: DC

## 2022-07-24 NOTE — Telephone Encounter (Signed)
Pt called requesting to submit for bilateral knee gel injections. Please call pt when approved. Pt phone number is [phone].

## 2022-07-28 ENCOUNTER — Ambulatory Visit: Payer: Medicare Other | Attending: Nurse Practitioner | Admitting: Nurse Practitioner

## 2022-07-28 ENCOUNTER — Encounter: Payer: Self-pay | Admitting: Nurse Practitioner

## 2022-07-28 VITALS — BP 98/59 | HR 65 | Ht 63.0 in | Wt 184.2 lb

## 2022-07-28 DIAGNOSIS — I428 Other cardiomyopathies: Secondary | ICD-10-CM | POA: Diagnosis not present

## 2022-07-28 DIAGNOSIS — Z8673 Personal history of transient ischemic attack (TIA), and cerebral infarction without residual deficits: Secondary | ICD-10-CM

## 2022-07-28 DIAGNOSIS — I5032 Chronic diastolic (congestive) heart failure: Secondary | ICD-10-CM | POA: Diagnosis not present

## 2022-07-28 DIAGNOSIS — I4819 Other persistent atrial fibrillation: Secondary | ICD-10-CM

## 2022-07-28 DIAGNOSIS — E785 Hyperlipidemia, unspecified: Secondary | ICD-10-CM

## 2022-07-28 DIAGNOSIS — I10 Essential (primary) hypertension: Secondary | ICD-10-CM

## 2022-07-28 LAB — HEPATIC FUNCTION PANEL
ALT: 24 IU/L (ref 0–32)
AST: 34 IU/L (ref 0–40)
Albumin: 4.4 g/dL (ref 3.9–4.9)
Alkaline Phosphatase: 87 IU/L (ref 44–121)
Bilirubin Total: 0.3 mg/dL (ref 0.0–1.2)
Bilirubin, Direct: 0.12 mg/dL (ref 0.00–0.40)
Total Protein: 7.1 g/dL (ref 6.0–8.5)

## 2022-07-28 LAB — LIPID PANEL
Chol/HDL Ratio: 2.7 ratio (ref 0.0–4.4)
Cholesterol, Total: 111 mg/dL (ref 100–199)
HDL: 41 mg/dL (ref >39)
LDL Chol Calc (NIH): 55 mg/dL (ref 0–99)
Triglycerides: 72 mg/dL (ref 0–149)
VLDL Cholesterol Cal: 15 mg/dL (ref 5–40)

## 2022-07-28 MED ORDER — METOPROLOL SUCCINATE ER 50 MG PO TB24: 50.0000 mg | ORAL_TABLET | Freq: Every morning | ORAL | 3 refills | Status: DC

## 2022-07-28 NOTE — Progress Notes (Signed)
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  Past Medical History    Past Medical History:  Diagnosis Date   Aortic atherosclerosis ([place])    Arthritis    Cardiomyopathy ([place])    Hypertension    Internal hemorrhoids    Lupus ([place])    PUD (peptic ulcer disease)    Stroke ([place]) 08/13/2018   Vertigo    Past Surgical History:  Procedure Laterality Date   BACK SURGERY     CARDIOVERSION N/A 07/03/2022   Procedure: CARDIOVERSION;  Surgeon: Donato Heinz, MD;  Location: [place];  Service: Cardiovascular;  Laterality: N/A;   CARPAL TUNNEL RELEASE     COLONOSCOPY     KNEE CARTILAGE SURGERY     NECK SURGERY     2015   UPPER GASTROINTESTINAL ENDOSCOPY      Allergies  Allergies  Allergen Reactions   Codeine Nausea And Vomiting and Other (See Comments)     Labs/Other Studies Reviewed    The following studies were reviewed today: Echocardiogram 07/08/2022: IMPRESSIONS   1. Left ventricular ejection fraction, by estimation, is 60 to 65%. The  left ventricle has normal function. The left ventricle has no regional  wall motion abnormalities. Left ventricular diastolic parameters are  consistent with Grade II diastolic  dysfunction (pseudonormalization).   2. Right ventricular systolic function is normal. The right ventricular  size is normal. There is normal pulmonary artery systolic pressure.   3. Left atrial size was moderately dilated.   4. The mitral valve is normal in structure. Trivial mitral valve  regurgitation. No evidence of mitral  stenosis.   5. The aortic valve is tricuspid. Aortic valve regurgitation is not  visualized. No aortic stenosis is present.   6. The inferior vena cava is normal in size with greater than 50%  respiratory variability, suggesting right atrial pressure of 3 mmHg.   Comparison(s): Prior EF 50-55%.   Echocardiogram 04/02/2021: IMPRESSIONS    1. Left ventricular ejection fraction, by estimation, is 50 to 55%. The  left ventricle has low normal function. The left ventricle has no regional  wall motion abnormalities. Left ventricular diastolic parameters were  normal. There are excessive apical  trabeculations on the apical four chamber view suggestive of LV  non-compaction; short axis assessment not sufficient for Chin/Jenni  criteria evaluation of left ventricular non-compaction.   2. Right ventricular systolic function is normal. The right ventricular  size is normal. There is normal pulmonary artery systolic pressure.   3. The mitral valve is grossly normal. Trivial mitral valve  regurgitation. No evidence of mitral stenosis.   4. The aortic valve is tricuspid. Aortic valve regurgitation is not  visualized. No aortic stenosis is present.   5. The inferior vena cava is normal in size with greater than 50%  respiratory variability, suggesting right atrial pressure of 3 mmHg.   Comparison(s): A prior study was performed on 02/22/2020. Prior images  reviewed side by side. 02/22/20 EF 40-45%.   Cardiac MRI 03/02/2019: IMPRESSION: 1.  Mild LVE with diffuse hypokinesis EF 44%   2.   Evidence for ventricular non compaction   3.  Mild LAE   4.  Mild MR   5.  Mild AR   6.  No delayed hyper-enhancement post gadolinium  Lexiscan Myoview 06/19/2016: There was no ST segment deviation noted during stress. The study is normal. This is a low risk study. The left ventricular ejection fraction is mildly decreased (45-54%).   Normal pharmacologic nuclear stress test with no evidence of prior  infarct or ischemia.  Mildly impaired LVEF consistent with non-ischemic cardiomyopathy.   Recent Labs: 06/04/2022: Pro B Natriuretic peptide (BNP) 73.0; TSH 1.90 06/14/2022: ALT 20; B Natriuretic Peptide 100.6 06/26/2022: BUN 17; Creatinine, Ser 1.06; Hemoglobin 12.0; Platelets 310; Potassium 4.9; Sodium 145  Recent Lipid Panel    Component Value Date/Time   CHOL 125 03/11/2021 0929   TRIG 88 03/11/2021 0929   HDL 42 03/11/2021 0929   CHOLHDL 3.0 03/11/2021 0929   CHOLHDL 3 06/08/2020 0925   VLDL 15.2 06/08/2020 0925   LDLCALC 66 03/11/2021 0929    History of Present Illness    67 year old female with the above past medical history including persistent atrial fibrillation, chronic diastolic heart failure, 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  Nuclear study November 2017 showed no infarct or ischemia, mildly reduced LV function.  Echocardiogram in July 2019 showed EF 45 to 50%, prominent apical trabeculation concerning for noncompaction and mild tricuspid regurgitation.  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  Carotid Dopplers in January 2020 showed 1 to 39% bilateral carotid artery stenosis.  Cardiac MRI in 02/2019 showed EF 44%, finding consistent with noncompaction, mild LAE, mild AI, and MR.  Echocardiogram in September 2022 showed EF 50 to 55%, apical trabeculations suggestive of elevated noncompaction.  She was evaluated in the ED in 05/2022 in the setting of dyspnea.  CTA chest showed no pulmonary embolus, small pleural effusions.  She was treated for CHF and URI.  She was last seen in the office on 06/17/2022 and was stable from a cardiac standpoint.  She had recently been diagnosed with atrial fibrillation.  She remained in atrial fibrillation at the time of her visit and noted ongoing palpitations, dyspnea on exertion.  HCTZ was discontinued and she was treated with Lasix 20 mg daily with an additional 20 mg  daily as needed.  She underwent DCCV on 07/03/2022 with restoration of NSR.  Echocardiogram in 06/2022 showed EF 60 to 65%, normal LV function, no RWMA, G2 DD, normal RV systolic function.  She presents today for follow-up accompanied by her husband.  Since her last visit he has been stable overall from a cardiac standpoint.  Prior to her cardioversion she noted worsening dizziness, lightheadedness and reports she fell twice.  Since her cardioversion, she notes improvement in her lightheadedness/dizziness, denies any further falls, denies presyncope, syncope.  She also notes improvement in her dyspnea, she denies chest pain, palpitations, edema, PND, orthopnea, weight gain.  Overall, she reports feeling well.  Home Medications    Current Outpatient Medications  Medication Sig Dispense Refill   acetaminophen (TYLENOL) 650 MG CR tablet Take 1,300 mg by mouth every 8 (eight) hours as needed for pain.     belimumab (BENLYSTA) 400 MG SOLR injection Inject into the vein every 30 (thirty) days.     Cholecalciferol (VITAMIN D-3) 125 MCG (5000 UT) TABS Take 5,000 Units by mouth daily.     colchicine 0.6 MG tablet Take 1 tablet (0.6 mg total) by mouth 2 (two) times daily. 8 tablet 0  diclofenac Sodium (VOLTAREN) 1 % GEL Apply 4 g topically 4 (four) times daily as needed. 500 g 6   ELIQUIS 5 MG TABS tablet TAKE ONE TABLET BY MOUTH EVERY MORNING and TAKE ONE TABLET BY MOUTH EVERYDAY AT BEDTIME 180 tablet 1   furosemide (LASIX) 20 MG tablet Take 1 tablet once daily and may take an extra tablet as needed for shortness of breath or weight gain 180 tablet 3   gabapentin (NEURONTIN) 800 MG tablet TAKE ONE TABLET BY MOUTH EVERY MORNING and TAKE ONE TABLET BY MOUTH EVERYDAY AT BEDTIME 240 tablet 3   losartan (COZAAR) 50 MG tablet Take 1 tablet (50 mg total) by mouth every morning. 90 tablet 0   metFORMIN (GLUCOPHAGE) 500 MG tablet TAKE ONE TABLET BY MOUTH EVERYDAY AT BEDTIME 90 tablet 3   methocarbamol (ROBAXIN) 500  MG tablet Take 1 tablet (500 mg total) by mouth every 8 (eight) hours as needed for muscle spasms. 30 tablet 0   omeprazole (PRILOSEC) 40 MG capsule TAKE ONE CAPSULE BY MOUTH ONCE DAILY 90 capsule 1   ondansetron (ZOFRAN ODT) 8 MG disintegrating tablet Take 1 tablet (8 mg total) by mouth every 8 (eight) hours as needed for nausea or vomiting. 30 tablet 0   rosuvastatin (CRESTOR) 20 MG tablet TAKE ONE TABLET BY MOUTH EVERYDAY AT BEDTIME 90 tablet 3   Semaglutide, 1 MG/DOSE, 4 MG/3ML SOPN Inject 1 mg as directed once a week. 9 mL 1   sucralfate (CARAFATE) 1 g tablet Take 1 tablet (1 g total) by mouth 4 (four) times daily -  with meals and at bedtime. As needed 40 tablet 1   traMADol (ULTRAM) 50 MG tablet Take 1 tablet (50 mg total) by mouth every 12 (twelve) hours as needed for severe pain. 30 tablet 0   Vitamin A 2400 MCG (8000 UT) CAPS Take 2,400 Units by mouth daily.     metoprolol succinate (TOPROL-XL) 50 MG 24 hr tablet Take 1 tablet (50 mg total) by mouth every morning. 90 tablet 3   No current facility-administered medications for this visit.     Review of Systems    She denies chest pain, palpitations, dyspnea, pnd, orthopnea, n, v, dizziness, syncope, edema, weight gain, or early satiety. All other systems reviewed and are otherwise negative except as noted above.   Physical Exam    VS:  BP (!) 98/59   Pulse 65   Ht '5\' 3"'$  (1.6 m)   Wt 184 lb 3.2 oz (83.6 kg)   SpO2 98%   BMI 32.63 kg/m   GEN: Well nourished, well developed, in no acute distress. HEENT: normal. Neck: Supple, no JVD, carotid bruits, or masses. Cardiac: RRR, no murmurs, rubs, or gallops. No clubbing, cyanosis, edema.  Radials/DP/PT 2+ and equal bilaterally.  Respiratory:  Respirations regular and unlabored, clear to auscultation bilaterally. GI: Soft, nontender, nondistended, BS + x 4. MS: no deformity or atrophy. Skin: warm and dry, no rash. Neuro:  Strength and sensation are intact. Psych: Normal  affect.  Accessory Clinical Findings    ECG personally reviewed by me today -NSR, 75 bpm- no acute changes.   Lab Results  Component Value Date   WBC 6.7 06/26/2022   HGB 12.0 06/26/2022   HCT 37.8 06/26/2022   MCV 87 06/26/2022   PLT 310 06/26/2022   Lab Results  Component Value Date   CREATININE 1.06 (H) 06/26/2022   BUN 17 06/26/2022   NA 145 (H) 06/26/2022   K 4.9 06/26/2022  CL 104 06/26/2022   CO2 25 06/26/2022   Lab Results  Component Value Date   ALT 20 06/14/2022   AST 21 06/14/2022   ALKPHOS 68 06/14/2022   BILITOT 0.3 06/14/2022   Lab Results  Component Value Date   CHOL 125 03/11/2021   HDL 42 03/11/2021   LDLCALC 66 03/11/2021   TRIG 88 03/11/2021   CHOLHDL 3.0 03/11/2021    Lab Results  Component Value Date   HGBA1C 6.5 05/07/2022    Assessment & Plan   1. Persistent atrial fibrillation: Diagnosed in 05/2022.  S/p DCCV on 07/03/2022.  Maintaining NSR.  Continue Eliquis, metoprolol.  2. Chronic diastolic heart failure: Most recent echo in 06/2022 showed EF 60 to 65%, normal LV function, no RWMA, G2 DD, normal RV systolic function. Euvolemic and well compensated on exam.  Continue losartan, metoprolol, Lasix.  3. Cardiomyopathy: Prior evidence of cardiomyopathy consistent with noncompaction.  Recent echo stable as above. Continue current medications as above, including Eliquis ([facility] prior notes, history of CVA possibly secondary to intracranial vascular disease versus noncompaction).  No family history of sudden death.  Denies presyncope, syncope.  4. Hypertension: BP well controlled, borderline low in office today, she is asymptomatic with this. Continue to monitor BP, continue current antihypertensive regimen.   5. Hyperlipidemia: LDL was 66 in 02/2021. Will repeat fasting lipids, LFTs. Continue Crestor.   6. History of CVA: Continue Eliquis, Crestor.   7. Disposition: Follow-up as scheduled with Dr. Stanford Breed in 11/2022.      Lenna Sciara, NP 07/28/2022, 12:06 PM

## 2022-07-28 NOTE — Patient Instructions (Signed)
Medication Instructions:  Your physician recommends that you continue on your current medications as directed. Please refer to the Current Medication list given to you today.   *If you need a refill on your cardiac medications before your next appointment, please call your pharmacy*   Lab Work: Your physician recommends that you complete lab work today. Fasting Lipid panel & LFTs  If you have labs (blood work) drawn today and your tests are completely normal, you will receive your results only by: MyChart Message (if you have MyChart) OR A paper copy in the mail If you have any lab test that is abnormal or we need to change your treatment, we will call you to review the results.   Testing/Procedures: NONE ordered at this time of appointment    Follow-Up: At [facility], you and your health needs are our priority.  As part of our continuing mission to provide you with exceptional heart care, we have created designated Provider Care Teams.  These Care Teams include your primary Cardiologist (physician) and Advanced Practice Providers (APPs -  Physician Assistants and Nurse Practitioners) who all work together to provide you with the care you need, when you need it.  We recommend signing up for the patient portal called "MyChart".  Sign up information is provided on this After Visit Summary.  MyChart is used to connect with patients for Virtual Visits (Telemedicine).  Patients are able to view lab/test results, encounter notes, upcoming appointments, etc.  Non-urgent messages can be sent to your provider as well.   To learn more about what you can do with MyChart, go to [email or web address].    Your next appointment:    Keep follow up   The format for your next appointment:   In Person  Provider:   Kirk Ruths, MD     Other Instructions   Important Information About Sugar

## 2022-07-29 NOTE — Telephone Encounter (Signed)
VOB submitted for Durolane, bilateral knee.  

## 2022-07-30 ENCOUNTER — Telehealth: Payer: Self-pay | Admitting: Nurse Practitioner

## 2022-07-30 DIAGNOSIS — I10 Essential (primary) hypertension: Secondary | ICD-10-CM

## 2022-07-30 MED ORDER — METOPROLOL SUCCINATE ER 50 MG PO TB24: 50.0000 mg | ORAL_TABLET | Freq: Every morning | ORAL | 3 refills | Status: DC

## 2022-07-30 NOTE — Telephone Encounter (Signed)
Patient called back stating [facility] told her they had not received the refill prescription and would like the prescription re-faxed.

## 2022-07-30 NOTE — Telephone Encounter (Signed)
*  STAT* If patient is at the pharmacy, call can be transferred to refill team.   1. Which medications need to be refilled? (please list name of each medication and dose if known) Metoprolol  2. Which pharmacy/location (including street and city if local pharmacy) is medication to be sent to? [place], [place]  3. Do they need a 30 day or 90 day supply? 90 days and refills

## 2022-07-31 ENCOUNTER — Telehealth: Payer: Self-pay

## 2022-07-31 ENCOUNTER — Other Ambulatory Visit: Payer: Self-pay

## 2022-07-31 DIAGNOSIS — M170 Bilateral primary osteoarthritis of knee: Secondary | ICD-10-CM

## 2022-07-31 NOTE — Telephone Encounter (Signed)
Spoke with pt briefly. Pt would like a return call to discuss lab results.

## 2022-08-02 MED ORDER — METOPROLOL SUCCINATE ER 50 MG PO TB24: 50.0000 mg | ORAL_TABLET | Freq: Every day | ORAL | 3 refills | Status: DC

## 2022-08-02 NOTE — Addendum Note (Signed)
Addended by: Lamar Blinks C on: 08/02/2022 04:12 PM   Modules accepted: Orders

## 2022-08-03 ENCOUNTER — Other Ambulatory Visit: Payer: Self-pay | Admitting: Family Medicine

## 2022-08-03 ENCOUNTER — Other Ambulatory Visit: Payer: Self-pay | Admitting: Cardiology

## 2022-08-03 DIAGNOSIS — R1013 Epigastric pain: Secondary | ICD-10-CM

## 2022-08-03 DIAGNOSIS — Z8673 Personal history of transient ischemic attack (TIA), and cerebral infarction without residual deficits: Secondary | ICD-10-CM

## 2022-08-04 NOTE — Telephone Encounter (Signed)
Prescription refill request for Eliquis received. Indication:afib Last office visit:1/24 Scr:1.0 Age: 67 Weight:83.6  kg  Prescription refilled

## 2022-08-05 ENCOUNTER — Ambulatory Visit (INDEPENDENT_AMBULATORY_CARE_PROVIDER_SITE_OTHER): Payer: 59 | Admitting: Orthopaedic Surgery

## 2022-08-05 ENCOUNTER — Ambulatory Visit: Payer: Medicare Other | Admitting: Orthopaedic Surgery

## 2022-08-05 ENCOUNTER — Encounter: Payer: Self-pay | Admitting: Orthopaedic Surgery

## 2022-08-05 DIAGNOSIS — M1712 Unilateral primary osteoarthritis, left knee: Secondary | ICD-10-CM

## 2022-08-05 DIAGNOSIS — M170 Bilateral primary osteoarthritis of knee: Secondary | ICD-10-CM

## 2022-08-05 DIAGNOSIS — M1711 Unilateral primary osteoarthritis, right knee: Secondary | ICD-10-CM

## 2022-08-05 MED ORDER — LIDOCAINE HCL 1 % IJ SOLN
2.0000 mL | INTRAMUSCULAR | Status: AC | PRN
  Administered 2022-08-05: 2 mL

## 2022-08-05 MED ORDER — BUPIVACAINE HCL 0.5 % IJ SOLN
2.0000 mL | INTRAMUSCULAR | Status: AC | PRN
  Administered 2022-08-05: 2 mL via INTRA_ARTICULAR

## 2022-08-05 MED ORDER — SODIUM HYALURONATE 60 MG/3ML IX PRSY
60.0000 mg | PREFILLED_SYRINGE | INTRA_ARTICULAR | Status: AC | PRN
  Administered 2022-08-05: 60 mg via INTRA_ARTICULAR

## 2022-08-05 NOTE — Progress Notes (Signed)
Office Visit Note   Patient: Amanda Saunders           Date of Birth: April 04, 1956           MRN: [id number] Visit Date: 08/05/2022              Requested by: Darreld Mclean, MD [address] [place],  [place] [address] PCP: Darreld Mclean, MD   Assessment & Plan: Visit Diagnoses:  1. Bilateral primary osteoarthritis of knee     Plan: Impression is bilateral knee osteoarthritis.  Today, proceed with bilateral knee Durolane injections.  She tolerated these well.  Expiration date 10/18/2024.  Lot #[id number]  Follow-Up Instructions: Return if symptoms worsen or fail to improve.   Orders:  No orders of the defined types were placed in this encounter.  No orders of the defined types were placed in this encounter.     Procedures: Large Joint Inj: bilateral knee on 08/05/2022 7:41 PM Indications: pain Details: 22 G needle  Arthrogram: No  Medications (Right): 2 mL lidocaine 1 %; 2 mL bupivacaine 0.5 %; 60 mg Sodium Hyaluronate 60 MG/3ML Medications (Left): 2 mL lidocaine 1 %; 2 mL bupivacaine 0.5 %; 60 mg Sodium Hyaluronate 60 MG/3ML Outcome: tolerated well, no immediate complications Patient was prepped and draped in the usual sterile fashion.       Clinical Data: No additional findings.   Subjective: Chief Complaint  Patient presents with   Left Knee - Follow-up    Durolane   Right Knee - Follow-up    Durolane    HPI patient is a pleasant 89-year female who comes in today with bilateral knee osteoarthritis.  She is here for bilateral knee Durolane injections.  She has had these in the past with good but temporary relief.     Objective: Vital Signs: There were no vitals taken for this visit.    Ortho Exam unchanged bilateral knee exam  Specialty Comments:  No specialty comments available.  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[place]) 11/23/2020   Systemic lupus erythematosus ([place]) 08/04/2019   Acute ischemic stroke ([place]) 08/14/2018   HTN (hypertension) 08/14/2018   Controlled type 2 diabetes mellitus without complication, without long-term current use of insulin ([place]) 04/03/2016   History of CVA (cerebrovascular accident) 03/12/2016   Chest pain at rest 03/12/2016   Peripheral edema 03/12/2016   Past Medical History:  Diagnosis Date   Aortic atherosclerosis ([place])    Arthritis    Cardiomyopathy ([place])    Hypertension    Internal hemorrhoids    Lupus ([place])    PUD (peptic ulcer disease)    Stroke ([place]) 08/13/2018   Vertigo     Family History  Problem Relation Age of Onset   Hypertension Mother    Arthritis Mother    Heart failure Mother    Stroke Mother    Kidney disease Father    Hypertension Father    Colon cancer Neg Hx    Esophageal cancer Neg Hx    Rectal cancer Neg Hx    Stomach cancer Neg Hx     Past Surgical History:  Procedure Laterality Date   BACK SURGERY     CARDIOVERSION N/A 07/03/2022   Procedure:  CARDIOVERSION;  Surgeon: Donato Heinz, MD;  Location: [facility] ENDOSCOPY;  Service: Cardiovascular;  Laterality: N/A;   CARPAL TUNNEL RELEASE     COLONOSCOPY     KNEE CARTILAGE SURGERY     NECK SURGERY     2015   UPPER GASTROINTESTINAL ENDOSCOPY     Social History   Occupational History   Occupation: retired    Comment: retired  Tobacco Use   Smoking status: Former    Types: Cigarettes    Quit date: 07/21/2004    Years since quitting: 18.0   Smokeless tobacco: Never  Vaping Use   Vaping Use: Never used  Substance and Sexual Activity   Alcohol use: Not Currently    Comment: occasional   Drug use: No   Sexual activity: Not on file

## 2022-08-11 ENCOUNTER — Other Ambulatory Visit: Payer: Self-pay | Admitting: Family Medicine

## 2022-08-11 DIAGNOSIS — I428 Other cardiomyopathies: Secondary | ICD-10-CM

## 2022-08-13 ENCOUNTER — Other Ambulatory Visit: Payer: Self-pay | Admitting: Neurology

## 2022-08-14 ENCOUNTER — Telehealth: Payer: Self-pay | Admitting: Family Medicine

## 2022-08-15 NOTE — Telephone Encounter (Signed)
Patient states pharmacy has not received the prescription refill for her Ondansetron, so she would like for it to be resent. Please advise.

## 2022-08-18 NOTE — Telephone Encounter (Signed)
Pt did get medication last week.

## 2022-09-03 ENCOUNTER — Ambulatory Visit: Payer: 59 | Admitting: Family Medicine

## 2022-09-03 ENCOUNTER — Telehealth: Payer: Self-pay | Admitting: Gastroenterology

## 2022-09-03 NOTE — Telephone Encounter (Signed)
Inbound call from pt , want to know if she can schedule a Endoscopy/colonoscopy at the same time ... Please advise

## 2022-09-03 NOTE — Telephone Encounter (Signed)
The pt is calling to set up Colon and EGD.  She has a cardiac history recently and was advised that he should set up a follow up appt with Dr Fuller Plan to discuss.  Pt agrees and has been set up for appt on 3/12 with Dr Fuller Plan.

## 2022-09-22 DIAGNOSIS — M1712 Unilateral primary osteoarthritis, left knee: Secondary | ICD-10-CM | POA: Diagnosis not present

## 2022-09-22 DIAGNOSIS — M25561 Pain in right knee: Secondary | ICD-10-CM | POA: Diagnosis not present

## 2022-09-24 ENCOUNTER — Telehealth: Payer: Self-pay | Admitting: *Deleted

## 2022-09-24 NOTE — Telephone Encounter (Signed)
Patient with diagnosis of afib on Eliquis for anticoagulation.    Procedure: left TKA Date of procedure: TBD  CHA2DS2-VASc Score = 8  This indicates a 10.8% annual risk of stroke. The patient's score is based upon: CHF History: 1 HTN History: 1 Diabetes History: 1 Stroke History: 2 Vascular Disease History: 1 Age Score: 1 Gender Score: 1  Underwent cardioversion 07/03/22.  CrCl 26m/min using adj body weight Platelet count 310K  Pt will require 3 day Eliquis hold prior to TKA. Pt with elevated CV risk and CHADS2VASc score of 8, however stroke occurred 07/2018 a few years prior to afib dx 05/2022 and was thought to be possibly secondary to intracranial vascular disease versus noncompaction. Will forward to MD to confirm if 3 day Eliquis hold is acceptable.  **This guidance is not considered finalized until pre-operative APP has relayed final recommendations.**

## 2022-09-24 NOTE — Telephone Encounter (Signed)
Ok to hold Eliquis for 3 days prior to procedure per Dr Stanford Breed.

## 2022-09-24 NOTE — Telephone Encounter (Signed)
   Pre-operative Risk Assessment    Patient Name: Amanda Saunders  DOB: 08-27-1955 MRN: [id number]      Request for Surgical Clearance    Procedure:   Left total knee arthroplasty.  Date of Surgery:  Clearance TBD                                 Surgeon: Dr. Rod Can Surgeon's Group or Practice Name:   [facility] Phone number:  [phone] Fax number:  [phone]   Type of Clearance Requested:   - Medical  - Pharmacy:  Hold Apixaban (Eliquis) Not Indicated   Type of Anesthesia:  Spinal   Additional requests/questions:    Signed, Greer Ee   09/24/2022, 12:17 PM

## 2022-09-25 ENCOUNTER — Encounter: Payer: Self-pay | Admitting: Family Medicine

## 2022-09-26 NOTE — Telephone Encounter (Signed)
   Name: Amanda Saunders  DOB: March 09, 1956  MRN: [id number]   Primary Cardiologist: Kirk Ruths, MD  Chart reviewed as part of pre-operative protocol coverage. Patient was contacted 09/26/2022 in reference to pre-operative risk assessment for pending surgery as outlined below.  RUBBY BARBARY was last seen on 07/29/2022 by Diona Browner, NP.  Since that day, EYVA CALIFANO has done well from a cardiac standpoint.  Denies any new symptoms or concerns.  She is able to complete greater than 4 METS without difficulty.  Therefore, based on ACC/AHA guidelines, the patient would be at acceptable risk for the planned procedure without further cardiovascular testing.   The patient was advised that if she develops new symptoms prior to surgery to contact our office to arrange for a follow-up visit, and she verbalized understanding.  Per Dr. Stanford Breed, patient may hold Eliquis for 3 days prior to surgery.  Please resume Eliquis as soon as possible postprocedure, the discretion of the surgeon.  I will route this recommendation to the requesting party via Epic fax function and remove from pre-op pool. Please call with questions.  Lenna Sciara, NP 09/26/2022, 10:15 AM

## 2022-09-30 ENCOUNTER — Ambulatory Visit: Payer: 59 | Admitting: Gastroenterology

## 2022-10-08 ENCOUNTER — Ambulatory Visit (INDEPENDENT_AMBULATORY_CARE_PROVIDER_SITE_OTHER): Payer: 59 | Admitting: Family Medicine

## 2022-10-08 ENCOUNTER — Encounter: Payer: Self-pay | Admitting: Family Medicine

## 2022-10-08 VITALS — BP 124/78 | HR 83 | Temp 97.7°F | Resp 18 | Ht 69.0 in | Wt 178.2 lb

## 2022-10-08 DIAGNOSIS — E119 Type 2 diabetes mellitus without complications: Secondary | ICD-10-CM

## 2022-10-08 DIAGNOSIS — Z01810 Encounter for preprocedural cardiovascular examination: Secondary | ICD-10-CM | POA: Diagnosis not present

## 2022-10-08 LAB — COMPREHENSIVE METABOLIC PANEL
ALT: 17 U/L (ref 0–35)
AST: 21 U/L (ref 0–37)
Albumin: 4.2 g/dL (ref 3.5–5.2)
Alkaline Phosphatase: 76 U/L (ref 39–117)
BUN: 16 mg/dL (ref 6–23)
CO2: 31 mEq/L (ref 19–32)
Calcium: 9.3 mg/dL (ref 8.4–10.5)
Chloride: 102 mEq/L (ref 96–112)
Creatinine, Ser: 1.09 mg/dL (ref 0.40–1.20)
GFR: 53.3 mL/min — ABNORMAL LOW (ref >60.00)
Glucose, Bld: 99 mg/dL (ref 70–99)
Potassium: 3.9 mEq/L (ref 3.5–5.1)
Sodium: 143 mEq/L (ref 135–145)
Total Bilirubin: 0.3 mg/dL (ref 0.2–1.2)
Total Protein: 6.8 g/dL (ref 6.0–8.3)

## 2022-10-08 LAB — CBC
HCT: 36.3 % (ref 36.0–46.0)
Hemoglobin: 12 g/dL (ref 12.0–15.0)
MCHC: 33 g/dL (ref 30.0–36.0)
MCV: 81.6 fl (ref 78.0–100.0)
Platelets: 211 10*3/uL (ref 150.0–400.0)
RBC: 4.45 Mil/uL (ref 3.87–5.11)
RDW: 15.7 % — ABNORMAL HIGH (ref 11.5–15.5)
WBC: 5.5 10*3/uL (ref 4.0–10.5)

## 2022-10-08 LAB — HEMOGLOBIN A1C: Hgb A1c MFr Bld: 6 % (ref 4.6–6.5)

## 2022-10-08 NOTE — Progress Notes (Addendum)
[facility] [address] [place], [place] [address] [phone]  Date:  10/08/2022   Name:  Amanda Saunders   DOB:  [birth year]   MRN:  [id number]  PCP:  Darreld Mclean, MD    Chief Complaint: Pre-op Exam (R Knee)   History of Present Illness:  Amanda Saunders is a 67 y.o. very pleasant female patient who presents with the following:  Pt seen today for pre-operative visit She plans to have a left total knee per Dr Lyla Glassing - he plans to do at the end of May  Most recent visit with myself was in November   History of lupus, rheumatoid arthritis, cardiomyopathy, hypertension, diabetes, stroke January 2020  Dr Tomi Likens is her neurologist Dr Amil Amen rheumatology- she will see him in December [facility] done 2 days ago; she gets this every 30 days.  Patient notes her are symptoms are stable  Cardiology pre-op note on chart from 3/6-   Amanda Saunders was last seen on 07/29/2022 by Diona Browner, NP.  Since that day, Amanda Saunders has done well from a cardiac standpoint.  Denies any new symptoms or concerns.  She is able to complete greater than 4 METS without difficulty. Therefore, based on ACC/AHA guidelines, the patient would be at acceptable risk for the planned procedure without further cardiovascular testing.  The patient was advised that if she develops new symptoms prior to surgery to contact our office to arrange for a follow-up visit, and she verbalized understanding. Per Dr. Stanford Breed, patient may hold Eliquis for 3 days prior to surgery.  Please resume Eliquis as soon as possible postprocedure, the discretion of the surgeon.  Amanda Saunders notes she exercises for about 30 minutes at the gym a few times a week, using a stationary bike.  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   Systemic lupus erythematosus ([place]) 08/04/2019   Acute ischemic stroke ([place]) 08/14/2018   HTN (hypertension) 08/14/2018   Controlled type 2 diabetes mellitus without complication, without long-term current use of insulin ([place]) 04/03/2016   History of CVA (cerebrovascular accident) 03/12/2016   Chest pain at rest 03/12/2016   Peripheral edema 03/12/2016    Past Medical History:  Diagnosis Date   Aortic atherosclerosis ([place])    Arthritis    Cardiomyopathy ([place])    Hypertension    Internal hemorrhoids    Lupus ([place])    PUD (peptic ulcer disease)    Stroke ([place]) 08/13/2018   Vertigo     Past Surgical History:  Procedure Laterality Date   BACK SURGERY     CARDIOVERSION N/A 07/03/2022   Procedure: CARDIOVERSION;  Surgeon: Donato Heinz, MD;  Location: [facility];  Service: Cardiovascular;  Laterality: N/A;   CARPAL TUNNEL RELEASE     COLONOSCOPY     KNEE CARTILAGE SURGERY     NECK SURGERY     2015   UPPER GASTROINTESTINAL ENDOSCOPY      Social History   Tobacco Use   Smoking status: Former    Types: Cigarettes    Quit date: 07/21/2004    Years  since quitting: 18.2   Smokeless tobacco: Never  Vaping Use   Vaping Use: Never used  Substance Use Topics   Alcohol use: Not Currently    Comment: occasional   Drug use: No    Family History  Problem Relation Age of Onset   Hypertension Mother    Arthritis Mother    Heart failure Mother    Stroke Mother    Kidney disease Father    Hypertension Father    Colon cancer Neg Hx    Esophageal cancer Neg Hx    Rectal cancer Neg Hx    Stomach cancer Neg Hx     Allergies  Allergen Reactions   Codeine Nausea And Vomiting and Other (See Comments)    Medication list has been reviewed and  updated.  Current Outpatient Medications on File Prior to Visit  Medication Sig Dispense Refill   acetaminophen (TYLENOL) 650 MG CR tablet Take 1,300 mg by mouth every 8 (eight) hours as needed for pain.     belimumab (BENLYSTA) 400 MG SOLR injection Inject into the vein every 30 (thirty) days.     Cholecalciferol (VITAMIN D-3) 125 MCG (5000 UT) TABS Take 5,000 Units by mouth daily.     colchicine 0.6 MG tablet Take 1 tablet (0.6 mg total) by mouth 2 (two) times daily. 8 tablet 0   diclofenac Sodium (VOLTAREN) 1 % GEL Apply 4 g topically 4 (four) times daily as needed. 500 g 6   ELIQUIS 5 MG TABS tablet TAKE ONE TABLET BY MOUTH EVERY MORNING and TAKE ONE TABLET BY MOUTH EVERYDAY AT BEDTIME 180 tablet 1   furosemide (LASIX) 20 MG tablet Take 1 tablet once daily and may take an extra tablet as needed for shortness of breath or weight gain 180 tablet 3   gabapentin (NEURONTIN) 800 MG tablet TAKE ONE TABLET BY MOUTH every morning and TAKE ONE TABLET BY MOUTH everyday AT bedtime 60 tablet 10   losartan (COZAAR) 50 MG tablet TAKE ONE TABLET BY MOUTH EVERY MORNING 90 tablet 0   metFORMIN (GLUCOPHAGE) 500 MG tablet TAKE ONE TABLET BY MOUTH EVERYDAY AT BEDTIME 90 tablet 3   methocarbamol (ROBAXIN) 500 MG tablet Take 1 tablet (500 mg total) by mouth every 8 (eight) hours as needed for muscle spasms. 30 tablet 0   metoprolol succinate (TOPROL-XL) 50 MG 24 hr tablet Take 1 tablet (50 mg total) by mouth every morning. 90 tablet 3   metoprolol succinate (TOPROL-XL) 50 MG 24 hr tablet Take 1 tablet (50 mg total) by mouth daily. Take with or immediately following a meal. 90 tablet 3   omeprazole (PRILOSEC) 40 MG capsule TAKE ONE CAPSULE BY MOUTH ONCE DAILY 90 capsule 1   ondansetron (ZOFRAN-ODT) 8 MG disintegrating tablet DISSOLVE 1 TABLET IN MOUTH EVERY 8 HOURS AS NEEDED FOR NAUSEA FOR VOMITING 30 tablet 0   rosuvastatin (CRESTOR) 20 MG tablet TAKE ONE TABLET BY MOUTH EVERYDAY AT BEDTIME 90 tablet 3    Semaglutide, 1 MG/DOSE, 4 MG/3ML SOPN Inject 1 mg as directed once a week. 9 mL 1   sucralfate (CARAFATE) 1 g tablet Take 1 tablet (1 g total) by mouth 4 (four) times daily -  with meals and at bedtime. As needed 40 tablet 1   traMADol (ULTRAM) 50 MG tablet Take 1 tablet (50 mg total) by mouth every 12 (twelve) hours as needed for severe pain. 30 tablet 0   Vitamin A 2400 MCG (8000 UT) CAPS Take 2,400 Units by mouth daily.  No current facility-administered medications on file prior to visit.    Review of Systems:  As per HPI- otherwise negative.   Physical Examination: Vitals:   10/08/22 0957  BP: 124/78  Pulse: 83  Resp: 18  Temp: 97.7 F (36.5 C)  SpO2: 98%   Vitals:   10/08/22 0957  Weight: 178 lb 3.2 oz (80.8 kg)  Height: 5\' 9"  (1.753 m)   Body mass index is 26.32 kg/m. Ideal Body Weight: Weight in (lb) to have BMI = 25: 168.9  GEN: no acute distress.  Mildly overweight, looks well HEENT: Atraumatic, Normocephalic.  Ears and Nose: No external deformity. CV: RRR, No M/G/R. No JVD. No thrill. No extra heart sounds. PULM: CTA B, no wheezes, crackles, rhonchi. No retractions. No resp. distress. No accessory muscle use. ABD: S, NT, ND, +BS. No rebound. No HSM. EXTR: No c/c/e PSYCH: Normally interactive. Conversant.    Assessment and Plan: Controlled type 2 diabetes mellitus without complication, without long-term current use of insulin ([place]) - Plan: HgB A1c  Preoperative cardiovascular examination - Plan: CBC, Comprehensive metabolic panel  Patient seen today for preoperative visit.  Will obtain some basic labs and check her A1c to ensure no surprises.  Assuming her labs are reasonable she will be able to proceed with surgery.  Cardiology has already provided clearance and instructions regarding her blood thinner  Once labs are received I will send her preoperative form to [facility]  Signed Lamar Blinks, MD  Received her labs as below- message to pt,  clearance form sent to [facility]  Results for orders placed or performed in visit on 10/08/22  CBC  Result Value Ref Range   WBC 5.5 4.0 - 10.5 K/uL   RBC 4.45 3.87 - 5.11 Mil/uL   Platelets 211.0 150.0 - 400.0 K/uL   Hemoglobin 12.0 12.0 - 15.0 g/dL   HCT 36.3 36.0 - 46.0 %   MCV 81.6 78.0 - 100.0 fl   MCHC 33.0 30.0 - 36.0 g/dL   RDW 15.7 (H) 11.5 - 15.5 %  Comprehensive metabolic panel  Result Value Ref Range   Sodium 143 135 - 145 mEq/L   Potassium 3.9 3.5 - 5.1 mEq/L   Chloride 102 96 - 112 mEq/L   CO2 31 19 - 32 mEq/L   Glucose, Bld 99 70 - 99 mg/dL   BUN 16 6 - 23 mg/dL   Creatinine, Ser 1.09 0.40 - 1.20 mg/dL   Total Bilirubin 0.3 0.2 - 1.2 mg/dL   Alkaline Phosphatase 76 39 - 117 U/L   AST 21 0 - 37 U/L   ALT 17 0 - 35 U/L   Total Protein 6.8 6.0 - 8.3 g/dL   Albumin 4.2 3.5 - 5.2 g/dL   GFR 53.30 (L) >60.00 mL/min   Calcium 9.3 8.4 - 10.5 mg/dL  HgB A1c  Result Value Ref Range   Hgb A1c MFr Bld 6.0 4.6 - 6.5 %

## 2022-10-08 NOTE — Patient Instructions (Signed)
I will get your pre-op form sent in as soon as your labs are available

## 2022-10-16 DIAGNOSIS — D6869 Other thrombophilia: Secondary | ICD-10-CM | POA: Diagnosis not present

## 2022-10-16 DIAGNOSIS — Z0000 Encounter for general adult medical examination without abnormal findings: Secondary | ICD-10-CM | POA: Diagnosis not present

## 2022-10-16 DIAGNOSIS — Z1159 Encounter for screening for other viral diseases: Secondary | ICD-10-CM | POA: Diagnosis not present

## 2022-10-16 DIAGNOSIS — Z23 Encounter for immunization: Secondary | ICD-10-CM | POA: Diagnosis not present

## 2022-10-16 DIAGNOSIS — D849 Immunodeficiency, unspecified: Secondary | ICD-10-CM | POA: Diagnosis not present

## 2022-10-16 DIAGNOSIS — Z79899 Other long term (current) drug therapy: Secondary | ICD-10-CM | POA: Diagnosis not present

## 2022-10-16 DIAGNOSIS — N1831 Chronic kidney disease, stage 3a: Secondary | ICD-10-CM | POA: Diagnosis not present

## 2022-10-16 DIAGNOSIS — I428 Other cardiomyopathies: Secondary | ICD-10-CM | POA: Diagnosis not present

## 2022-10-16 DIAGNOSIS — I4819 Other persistent atrial fibrillation: Secondary | ICD-10-CM | POA: Diagnosis not present

## 2022-10-16 DIAGNOSIS — I700 Atherosclerosis of aorta: Secondary | ICD-10-CM | POA: Diagnosis not present

## 2022-10-16 DIAGNOSIS — E1151 Type 2 diabetes mellitus with diabetic peripheral angiopathy without gangrene: Secondary | ICD-10-CM | POA: Diagnosis not present

## 2022-11-01 ENCOUNTER — Other Ambulatory Visit: Payer: Self-pay | Admitting: Family Medicine

## 2022-11-01 DIAGNOSIS — I428 Other cardiomyopathies: Secondary | ICD-10-CM

## 2022-11-01 DIAGNOSIS — E119 Type 2 diabetes mellitus without complications: Secondary | ICD-10-CM

## 2022-11-06 ENCOUNTER — Ambulatory Visit: Payer: 59 | Admitting: Gastroenterology

## 2022-11-13 ENCOUNTER — Telehealth: Payer: Self-pay | Admitting: Family Medicine

## 2022-11-13 NOTE — Telephone Encounter (Signed)
Rep stated pt would like to have the following medication sent to [facility] instead of [facility].  Prescription Request  11/13/2022  Is this a "Controlled Substance" medicine? No  LOV: 10/08/2022  What is the name of the medication or equipment?   metoprolol succinate (TOPROL-XL) 50 MG 24 hr tablet [[id number]]   Have you contacted your pharmacy to request a refill? Yes   Which pharmacy would you like this sent to?   [facility] - [place], [name] - [address] [name] [address] Phone: [phone] Fax: [phone]    Patient notified that their request is being sent to the clinical staff for review and that they should receive a response within 2 business days.   Please advise at Mobile [phone] (mobile)

## 2022-11-14 MED ORDER — METOPROLOL SUCCINATE ER 50 MG PO TB24: 50.0000 mg | ORAL_TABLET | Freq: Every day | ORAL | 3 refills | Status: DC

## 2022-11-14 NOTE — Addendum Note (Signed)
Addended by: Thelma Barge D on: 11/14/2022 12:51 PM   Modules accepted: Orders

## 2022-11-14 NOTE — Telephone Encounter (Signed)
Rx sent in and patient notified.  

## 2022-11-19 ENCOUNTER — Encounter: Payer: Self-pay | Admitting: Family Medicine

## 2022-11-19 MED ORDER — SEMAGLUTIDE (2 MG/DOSE) 8 MG/3ML ~~LOC~~ SOPN: 2.0000 mg | PEN_INJECTOR | SUBCUTANEOUS | 3 refills | Status: DC

## 2022-11-27 ENCOUNTER — Ambulatory Visit: Payer: Medicare Other | Admitting: Neurology

## 2022-11-27 NOTE — Progress Notes (Signed)
Surgery orders requested via Epic inbox. °

## 2022-11-28 ENCOUNTER — Ambulatory Visit: Payer: Self-pay | Admitting: Student

## 2022-11-28 DIAGNOSIS — E119 Type 2 diabetes mellitus without complications: Secondary | ICD-10-CM

## 2022-12-03 NOTE — Progress Notes (Addendum)
COVID Vaccine received:  []  No [x]  Yes Date of any COVID positive Test in last 90 days:   PCP - Abbe [place] MD Cardiologist - Olga Millers MD   Chest x-ray - 06/14/22 EPIC EKG -  07/28/22  EPIC Stress Test -  ETT 04/22/16 EPIC ECHO - 07/08/22 EPIC Cardiac Cath -  Cardiac Clearance-Emily MongeNP 09/26/22 EPIC   Bowel Prep - []  No  []   Yes ______Cardiac  Pacemaker / ICD device []  No []  Yes   Spinal Cord Stimulator:[]  No []  Yes       History of Sleep Apnea? []  No []  Yes   CPAP used?- []  No []  Yes     Does the patient monitor blood sugar?          []  No []  Yes  []  N/A   Patient has: []  NO Hx DM   []  Pre-DM                 []  DM1  [x]   DM2 Does patient have a [employer] or Dexacom? []  No []  Yes   Fasting Blood Sugar Ranges-  Checks Blood Sugar _____ times a day   GLP1 agonist / usual dose - Ozempic GLP1 instructions: hold 7 days SGLT-2 inhibitors / usual dose -  SGLT-2 instructions:    Blood Thinner / Instructions: Eliquis Aspirin Instructions:   Comments:    Activity level: Patient is able / unable to climb a flight of stairs without difficulty; []  No CP  []  No SOB, but would have ___   Patient can / can not perform ADLs without assistance.    Anesthesia review:    Patient denies shortness of breath, fever, cough and chest pain at PAT appointment.   Patient verbalized understanding and agreement to the Pre-Surgical Instructions that were given to them at this PAT appointment. Patient was also educated of the need to review these PAT instructions again prior to his/her surgery.I reviewed the appropriate phone numbers to call if they have any and questions or concerns

## 2022-12-03 NOTE — Patient Instructions (Addendum)
SURGICAL WAITING ROOM VISITATION  Patients having surgery or a procedure may have no more than 2 support people in the waiting area - these visitors may rotate.    Children under the age of 40 must have an adult with them who is not the patient.  Due to an increase in RSV and influenza rates and associated hospitalizations, children ages 70 and under may not visit patients in [facility] hospitals.  If the patient needs to stay at the hospital during part of their recovery, the visitor guidelines for inpatient rooms apply. Pre-op nurse will coordinate an appropriate time for 1 support person to accompany patient in pre-op.  This support person may not rotate.    Please refer to the [facility] website for the visitor guidelines for Inpatients (after your surgery is over and you are in a regular room).       Your procedure is scheduled on: 12/17/22   Report to [facility] Main Entrance    Report to admitting at 5:15 AM   Call this number if you have problems the morning of surgery [phone]   Do not eat food :After Midnight.   After Midnight you may have the following liquids until 4:30  AM DAY OF SURGERY  Water Non-Citrus Juices (without pulp, NO RED-Apple, White grape, White cranberry) Black Coffee (NO MILK/CREAM OR CREAMERS, sugar ok)  Clear Tea (NO MILK/CREAM OR CREAMERS, sugar ok) regular and decaf                             Plain Jell-O (NO RED)                                           Fruit ices (not with fruit pulp, NO RED)                                     Popsicles (NO RED)                                                               Sports drinks like Gatorade (NO RED)                   The day of surgery:  Drink ONE (1) Pre-Surgery G2 at 4:30 AM the morning of surgery. Drink in one sitting. Do not sip.  This drink was given to you during your hospital  pre-op appointment visit. Nothing else to drink after completing the  Pre-Surgery  G2.           If you have questions, please contact your surgeon's office.   FOLLOW BOWEL PREP AND ANY ADDITIONAL PRE OP INSTRUCTIONS YOU RECEIVED FROM YOUR SURGEON'S OFFICE!!!     Oral Hygiene is also important to reduce your risk of infection.                                    Remember - BRUSH YOUR TEETH THE MORNING OF SURGERY WITH YOUR  REGULAR TOOTHPASTE  DENTURES WILL BE REMOVED PRIOR TO SURGERY PLEASE DO NOT APPLY "Poly grip" OR ADHESIVES!!!   Do NOT smoke after Midnight   Take these medicines the morning of surgery with A SIP OF WATER: Tylenol, Gabapentin, hydroxychloroquine, metoprolol, Omeprazole, Rosuvastatin  DO NOT TAKE ANY ORAL DIABETIC MEDICATIONS DAY OF YOUR SURGERY  How to Manage Your Diabetes Before and After Surgery  Why is it important to control my blood sugar before and after surgery? Improving blood sugar levels before and after surgery helps healing and can limit problems. A way of improving blood sugar control is eating a healthy diet by:  Eating less sugar and carbohydrates  Increasing activity/exercise  Talking with your doctor about reaching your blood sugar goals High blood sugars (greater than 180 mg/dL) can raise your risk of infections and slow your recovery, so you will need to focus on controlling your diabetes during the weeks before surgery. Make sure that the doctor who takes care of your diabetes knows about your planned surgery including the date and location.  How do I manage my blood sugar before surgery? Check your blood sugar at least 4 times a day, starting 2 days before surgery, to make sure that the level is not too high or low. Check your blood sugar the morning of your surgery when you wake up and every 2 hours until you get to the Short Stay unit. If your blood sugar is less than 70 mg/dL, you will need to treat for low blood sugar: Do not take insulin. Treat a low blood sugar (less than 70 mg/dL) with  cup of clear juice (cranberry or apple), 4  glucose tablets, OR glucose gel. Recheck blood sugar in 15 minutes after treatment (to make sure it is greater than 70 mg/dL). If your blood sugar is not greater than 70 mg/dL on recheck, call [phone] for further instructions. Report your blood sugar to the short stay nurse when you get to Short Stay.  If you are admitted to the hospital after surgery: Your blood sugar will be checked by the staff and you will probably be given insulin after surgery (instead of oral diabetes medicines) to make sure you have good blood sugar levels. The goal for blood sugar control after surgery is 80-180 mg/dL.   WHAT DO I DO ABOUT MY DIABETES MEDICATION?  Do not take oral diabetes medicines (pills) the morning of surgery.  THE NIGHT BEFORE SURGERY, take     units of       insulin.       THE MORNING OF SURGERY, take   units of         insulin.  DO NOT TAKE THE FOLLOWING 7 DAYS PRIOR TO SURGERY: Ozempic, Wegovy, Rybelsus (Semaglutide), Byetta (exenatide), Bydureon (exenatide ER), Victoza, Saxenda (liraglutide), or Trulicity (dulaglutide) Mounjaro (Tirzepatide) Adlyxin (Lixisenatide), Polyethylene Glycol Loxenatide.  If your CBG is greater than 220 mg/dL, you may take  of your sliding scale  (correction) dose of insulin.    For patients with insulin pumps: Contact your diabetes doctor for specific instructions before surgery. Decrease basal rates by 20% at midnight the night before your surgery. Note that if your surgery is planned to be longer than 2 hours, your insulin pump will be removed and intravenous (IV) insulin will be started and managed by the nurses and the anesthesiologist. You will be able to restart your insulin pump once you are awake and able to manage it.  Make sure to bring insulin pump  supplies to the hospital with you in case the  site needs to be changed.  Patient Signature:  Date:   Nurse Signature:  Date:   Reviewed and Endorsed by [facility] Patient Education Committee,  August 2015  Bring CPAP mask and tubing day of surgery.                              You may not have any metal on your body including hair pins, jewelry, and body piercing             Do not wear make-up, lotions, powders, perfumes or deodorant  Do not wear nail polish including gel and S&S, artificial/acrylic nails, or any other type of covering on natural nails including finger and toenails. If you have artificial nails, gel coating, etc. that needs to be removed by a nail salon please have this removed prior to surgery or surgery may need to be canceled/ delayed if the surgeon/ anesthesia feels like they are unable to be safely monitored.   Do not shave  48 hours prior to surgery.            Do not bring valuables to the hospital. [facility] IS NOT             RESPONSIBLE   FOR VALUABLES.   Contacts, glasses, dentures or bridgework may not be worn into surgery.   Bring small overnight bag day of surgery.   DO NOT BRING YOUR HOME MEDICATIONS TO THE HOSPITAL. PHARMACY WILL DISPENSE MEDICATIONS LISTED ON YOUR MEDICATION LIST TO YOU DURING YOUR ADMISSION IN THE HOSPITAL!   Special Instructions: Bring a copy of your healthcare power of attorney and living will documents the day of surgery if you haven't scanned them before.              Please read over the following fact sheets you were given: IF YOU HAVE QUESTIONS ABOUT YOUR PRE-OP INSTRUCTIONS PLEASE CALL [phone] [place]    If you received a COVID test during your pre-op visit  it is requested that you wear a mask when out in public, stay away from anyone that may not be feeling well and notify your surgeon if you develop symptoms. If you test positive for Covid or have been in contact with anyone that has tested positive in the last 10 days please notify you surgeon.

## 2022-12-03 NOTE — Progress Notes (Signed)
COVID Vaccine received:  []  No [x]  Yes Date of any COVID positive Test in last 90 days:  PCP - Abbe [place] MD Cardiologist - Olga Millers MD  Chest x-ray - 06/14/22 EPIC EKG -  07/28/22  EPIC Stress Test -  ETT 04/22/16 EPIC ECHO - 07/08/22 EPIC Cardiac Cath -  Cardiac Clearance-Emily MongeNP 09/26/22 EPIC  Bowel Prep - []  No  []   Yes ______Cardiac  Pacemaker / ICD device []  No []  Yes   Spinal Cord Stimulator:[]  No []  Yes       History of Sleep Apnea? []  No []  Yes   CPAP used?- []  No []  Yes    Does the patient monitor blood sugar?          []  No []  Yes  []  N/A  Patient has: []  NO Hx DM   []  Pre-DM                 []  DM1  []   DM2 Does patient have a [employer] or Dexacom? []  No []  Yes   Fasting Blood Sugar Ranges-  Checks Blood Sugar _____ times a day  GLP1 agonist / usual dose -  GLP1 instructions:  SGLT-2 inhibitors / usual dose -  SGLT-2 instructions:   Blood Thinner / Instructions: Aspirin Instructions:  Comments:   Activity level: Patient is able / unable to climb a flight of stairs without difficulty; []  No CP  []  No SOB, but would have ___   Patient can / can not perform ADLs without assistance.   Anesthesia review:   Patient denies shortness of breath, fever, cough and chest pain at PAT appointment.  Patient verbalized understanding and agreement to the Pre-Surgical Instructions that were given to them at this PAT appointment. Patient was also educated of the need to review these PAT instructions again prior to his/her surgery.I reviewed the appropriate phone numbers to call if they have any and questions or concerns.

## 2022-12-04 ENCOUNTER — Other Ambulatory Visit: Payer: Self-pay

## 2022-12-04 ENCOUNTER — Encounter (HOSPITAL_COMMUNITY)
Admission: RE | Admit: 2022-12-04 | Discharge: 2022-12-04 | Disposition: A | Discharge status: Home / Self Care | Payer: 59 | Source: Ambulatory Visit | Attending: Orthopedic Surgery | Admitting: Orthopedic Surgery

## 2022-12-04 ENCOUNTER — Encounter (HOSPITAL_COMMUNITY): Payer: Self-pay

## 2022-12-04 VITALS — BP 124/82 | HR 74 | Temp 98.4°F | Resp 18 | Ht 63.0 in | Wt 174.0 lb

## 2022-12-04 DIAGNOSIS — E119 Type 2 diabetes mellitus without complications: Secondary | ICD-10-CM | POA: Diagnosis not present

## 2022-12-04 DIAGNOSIS — I10 Essential (primary) hypertension: Secondary | ICD-10-CM | POA: Insufficient documentation

## 2022-12-04 DIAGNOSIS — Z01818 Encounter for other preprocedural examination: Secondary | ICD-10-CM

## 2022-12-04 DIAGNOSIS — Z01812 Encounter for preprocedural laboratory examination: Secondary | ICD-10-CM | POA: Diagnosis not present

## 2022-12-04 HISTORY — DX: Type 2 diabetes mellitus without complications: E11.9

## 2022-12-04 HISTORY — DX: Dyspnea, unspecified: R06.00

## 2022-12-04 HISTORY — DX: Headache, unspecified: R51.9

## 2022-12-04 HISTORY — DX: Cardiac arrhythmia, unspecified: I49.9

## 2022-12-04 HISTORY — DX: Cardiac murmur, unspecified: R01.1

## 2022-12-04 LAB — CBC
HCT: 35 % — ABNORMAL LOW (ref 36.0–46.0)
Hemoglobin: 11 g/dL — ABNORMAL LOW (ref 12.0–15.0)
MCH: 26.2 pg (ref 26.0–34.0)
MCHC: 31.4 g/dL (ref 30.0–36.0)
MCV: 83.3 fL (ref 80.0–100.0)
Platelets: 204 10*3/uL (ref 150–400)
RBC: 4.2 MIL/uL (ref 3.87–5.11)
RDW: 15 % (ref 11.5–15.5)
WBC: 5.5 10*3/uL (ref 4.0–10.5)
nRBC: 0 % (ref 0.0–0.2)

## 2022-12-04 LAB — BASIC METABOLIC PANEL
Anion gap: 10 (ref 5–15)
BUN: 18 mg/dL (ref 8–23)
CO2: 26 mmol/L (ref 22–32)
Calcium: 8.3 mg/dL — ABNORMAL LOW (ref 8.9–10.3)
Chloride: 103 mmol/L (ref 98–111)
Creatinine, Ser: 0.83 mg/dL (ref 0.44–1.00)
GFR, Estimated: >60 mL/min (ref >60)
Glucose, Bld: 101 mg/dL — ABNORMAL HIGH (ref 70–99)
Potassium: 3.9 mmol/L (ref 3.5–5.1)
Sodium: 139 mmol/L (ref 135–145)

## 2022-12-04 LAB — HEMOGLOBIN A1C
Hgb A1c MFr Bld: 5.9 % — ABNORMAL HIGH (ref 4.8–5.6)
Mean Plasma Glucose: 122.63 mg/dL

## 2022-12-04 LAB — SURGICAL PCR SCREEN
MRSA, PCR: NEGATIVE
Staphylococcus aureus: NEGATIVE

## 2022-12-04 LAB — GLUCOSE, CAPILLARY: Glucose-Capillary: 105 mg/dL — ABNORMAL HIGH (ref 70–99)

## 2022-12-10 NOTE — Anesthesia Preprocedure Evaluation (Addendum)
Anesthesia Evaluation  Patient identified by MRN, date of birth, ID band Patient awake    Reviewed: Allergy & Precautions, H&P , NPO status , Patient's Chart, lab work & pertinent test results  Airway Mallampati: I  TM Distance: >3 FB Neck ROM: Full    Dental no notable dental hx. (+) Teeth Intact, Dental Advisory Given   Pulmonary shortness of breath, former smoker   Pulmonary exam normal breath sounds clear to auscultation       Cardiovascular Exercise Tolerance: Good hypertension, Pt. on medications Normal cardiovascular exam+ dysrhythmias + Valvular Problems/Murmurs  Rhythm:Regular Rate:Normal  CV: Echo 07/08/2022  1. Left ventricular ejection fraction, by estimation, is 60 to 65%. The  left ventricle has normal function. The left ventricle has no regional  wall motion abnormalities. Left ventricular diastolic parameters are  consistent with Grade II diastolic  dysfunction (pseudonormalization).   2. Right ventricular systolic function is normal. The right ventricular  size is normal. There is normal pulmonary artery systolic pressure.   3. Left atrial size was moderately dilated.   4. The mitral valve is normal in structure. Trivial mitral valve  regurgitation. No evidence of mitral stenosis.   5. The aortic valve is tricuspid. Aortic valve regurgitation is not  visualized. No aortic stenosis is present.   6. The inferior vena cava is normal in size with greater than 50%  respiratory variability, suggesting right atrial pressure of 3 mmHg.     Neuro/Psych  Headaches CVA negative neurological ROS  negative psych ROS   GI/Hepatic negative GI ROS, Neg liver ROS, PUD,,,  Endo/Other  diabetes    Renal/GU negative Renal ROS  negative genitourinary   Musculoskeletal negative musculoskeletal ROS (+) Arthritis , Osteoarthritis,    Abdominal   Peds negative pediatric ROS (+)  Hematology negative hematology ROS (+)    Anesthesia Other Findings   Reproductive/Obstetrics negative OB ROS                              Anesthesia Physical Anesthesia Plan  ASA: 3  Anesthesia Plan: Spinal, Regional and MAC   Post-op Pain Management: Minimal or no pain anticipated and Regional block*   Induction:   PONV Risk Score and Plan: 2 and Ondansetron, Dexamethasone, Treatment may vary due to age or medical condition and Propofol infusion  Airway Management Planned: Simple Face Mask and Natural Airway  Additional Equipment: None  Intra-op Plan:   Post-operative Plan:   Informed Consent: I have reviewed the patients History and Physical, chart, labs and discussed the procedure including the risks, benefits and alternatives for the proposed anesthesia with the patient or authorized representative who has indicated his/her understanding and acceptance.       Plan Discussed with: Anesthesiologist and CRNA  Anesthesia Plan Comments: (See PAT note 12/04/2022  DISCUSSION:67 y.o. former smoker with h/o HTN, Lupus, DM, stroke, atrial fibrillation, cardiomyopathy, left knee OA scheduled for above procedure 12/17/2022 with Dr. Samson Frederic.    Per cardiology preoperative evaluation 09/26/2022, "Chart reviewed as part of pre-operative protocol coverage. Patient was contacted 09/26/2022 in reference to pre-operative risk assessment for pending surgery as outlined below.  DAMBER BARWICK was last seen on 07/29/2022 by Bernadene Person, NP.  Since that day, WILLY RAK has done well from a cardiac standpoint.  Denies any new symptoms or concerns.  She is able to complete greater than 4 METS without difficulty.   Therefore, based on ACC/AHA guidelines, the  patient would be at acceptable risk for the planned procedure without further cardiovascular testing.    )        Anesthesia Quick Evaluation

## 2022-12-10 NOTE — Progress Notes (Signed)
Anesthesia Chart Review   Case: [id number] Date/Time: 12/17/22 0715   Procedure: COMPUTER ASSISTED TOTAL KNEE ARTHROPLASTY (Left: Knee)   Anesthesia type: Spinal   Pre-op diagnosis: left knee osteoarthritis   Location: [facility] ROOM 07 / [facility]   Surgeons: Samson Frederic, MD       DISCUSSION:67 y.o. former smoker with h/o HTN, Lupus, DM, stroke, atrial fibrillation, cardiomyopathy, left knee OA scheduled for above procedure 12/17/2022 with Dr. Samson Frederic.   Per cardiology preoperative evaluation 09/26/2022, "Chart reviewed as part of pre-operative protocol coverage. Patient was contacted 09/26/2022 in reference to pre-operative risk assessment for pending surgery as outlined below.  ELINE CHAPARRO was last seen on 07/29/2022 by Bernadene Person, NP.  Since that day, MIZANI ILLA has done well from a cardiac standpoint.  Denies any new symptoms or concerns.  She is able to complete greater than 4 METS without difficulty.   Therefore, based on ACC/AHA guidelines, the patient would be at acceptable risk for the planned procedure without further cardiovascular testing.    The patient was advised that if she develops new symptoms prior to surgery to contact our office to arrange for a follow-up visit, and she verbalized understanding.   Per Dr. Jens Som, patient may hold Eliquis for 3 days prior to surgery.  Please resume Eliquis as soon as possible postprocedure, the discretion of the surgeon."  Pt reports last dose of Eliquis 12/13/22 PM dose.  VS: BP 124/82   Pulse 74   Temp 36.9 C (Oral)   Resp 18   Ht 5\' 3"  (1.6 m)   Wt 78.9 kg   SpO2 100%   BMI 30.82 kg/m   PROVIDERS: Copland, Gwenlyn Found, MD is PCP   Cardiologist - Olga Millers MD  LABS: Labs reviewed: Acceptable for surgery. (all labs ordered are listed, but only abnormal results are displayed)  Labs Reviewed  BASIC METABOLIC PANEL - Abnormal; Notable for the following components:      Result Value   Glucose, Bld 101 (*)     Calcium 8.3 (*)    All other components within normal limits  CBC - Abnormal; Notable for the following components:   Hemoglobin 11.0 (*)    HCT 35.0 (*)    All other components within normal limits  HEMOGLOBIN A1C - Abnormal; Notable for the following components:   Hgb A1c MFr Bld 5.9 (*)    All other components within normal limits  GLUCOSE, CAPILLARY - Abnormal; Notable for the following components:   Glucose-Capillary 105 (*)    All other components within normal limits  SURGICAL PCR SCREEN     IMAGES:   EKG:   CV: Echo 07/08/2022  1. Left ventricular ejection fraction, by estimation, is 60 to 65%. The  left ventricle has normal function. The left ventricle has no regional  wall motion abnormalities. Left ventricular diastolic parameters are  consistent with Grade II diastolic  dysfunction (pseudonormalization).   2. Right ventricular systolic function is normal. The right ventricular  size is normal. There is normal pulmonary artery systolic pressure.   3. Left atrial size was moderately dilated.   4. The mitral valve is normal in structure. Trivial mitral valve  regurgitation. No evidence of mitral stenosis.   5. The aortic valve is tricuspid. Aortic valve regurgitation is not  visualized. No aortic stenosis is present.   6. The inferior vena cava is normal in size with greater than 50%  respiratory variability, suggesting right atrial pressure of 3 mmHg.  Past Medical History:  Diagnosis Date   Aortic atherosclerosis (HCC)    Arthritis    Cardiomyopathy (HCC)    Diabetes mellitus without complication (HCC)    Dyspnea    Dysrhythmia    Headache    Heart murmur    at birth   Hypertension    Internal hemorrhoids    Lupus (HCC)    PUD (peptic ulcer disease)    Stroke (HCC) 08/13/2018   Vertigo     Past Surgical History:  Procedure Laterality Date   BACK SURGERY     CARDIOVERSION N/A 07/03/2022   Procedure: CARDIOVERSION;  Surgeon: Little Ishikawa, MD;  Location: [facility] ENDOSCOPY;  Service: Cardiovascular;  Laterality: N/A;   CARPAL TUNNEL RELEASE     COLONOSCOPY     KNEE CARTILAGE SURGERY     NECK SURGERY     2015   UPPER GASTROINTESTINAL ENDOSCOPY      MEDICATIONS:  acetaminophen (TYLENOL) 650 MG CR tablet   belimumab (BENLYSTA) 400 MG SOLR injection   Cholecalciferol (VITAMIN D-3) 125 MCG (5000 UT) TABS   CINNAMON PO   colchicine 0.6 MG tablet   diclofenac Sodium (VOLTAREN) 1 % GEL   ELIQUIS 5 MG TABS tablet   furosemide (LASIX) 20 MG tablet   gabapentin (NEURONTIN) 800 MG tablet   Ginger, Zingiber officinalis, (GINGER PO)   hydroxychloroquine (PLAQUENIL) 200 MG tablet   losartan (COZAAR) 50 MG tablet   Magnesium 250 MG TABS   metFORMIN (GLUCOPHAGE) 500 MG tablet   methocarbamol (ROBAXIN) 500 MG tablet   metoprolol succinate (TOPROL-XL) 50 MG 24 hr tablet   Omega 3-6-9 Fatty Acids (TRIPLE OMEGA COMPLEX PO)   omeprazole (PRILOSEC) 40 MG capsule   ondansetron (ZOFRAN-ODT) 8 MG disintegrating tablet   rosuvastatin (CRESTOR) 20 MG tablet   Semaglutide, 2 MG/DOSE, 8 MG/3ML SOPN   sucralfate (CARAFATE) 1 g tablet   traMADol (ULTRAM) 50 MG tablet   TURMERIC PO   Vitamin A 2400 MCG (8000 UT) CAPS   No current facility-administered medications for this encounter.    Jodell Cipro Ward, PA-C [facility] [phone]

## 2022-12-12 NOTE — Progress Notes (Deleted)
HPI: FU cardiomyopathy. Nuclear study November 2017 showed no infarct or ischemia.  LV function mildly reduced.  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 Had CVA January 2020. Carotid Dopplers January 2020 showed 1 to 39% bilateral stenosis. Cardiac MRI 8/20 showed EF 44 and findings c/w noncompaction, mild LAE, mild AI and MR. CTA November 2023 showed no pulmonary embolus but there was note of small pleural effusions. Also with h/o atrial fibrillation. Echo 12/23 showed normal LV function, grade 2 DD, moderate LAE. Had successful DCCV 12/23. Since last seen,   Current Outpatient Medications  Medication Sig Dispense Refill   acetaminophen (TYLENOL) 650 MG CR tablet Take 1,300 mg by mouth every 8 (eight) hours as needed for pain.     belimumab (BENLYSTA) 400 MG SOLR injection Inject into the vein every 30 (thirty) days.     Cholecalciferol (VITAMIN D-3) 125 MCG (5000 UT) TABS Take 5,000 Units by mouth daily.     CINNAMON PO Take 1 capsule by mouth daily.     colchicine 0.6 MG tablet Take 1 tablet (0.6 mg total) by mouth 2 (two) times daily. (Patient not taking: Reported on 12/02/2022) 8 tablet 0   diclofenac Sodium (VOLTAREN) 1 % GEL Apply 4 g topically 4 (four) times daily as needed. 500 g 6   ELIQUIS 5 MG TABS tablet TAKE ONE TABLET BY MOUTH EVERY MORNING and TAKE ONE TABLET BY MOUTH EVERYDAY AT BEDTIME 180 tablet 1   furosemide (LASIX) 20 MG tablet Take 1 tablet once daily and may take an extra tablet as needed for shortness of breath or weight gain 180 tablet 3   gabapentin (NEURONTIN) 800 MG tablet TAKE ONE TABLET BY MOUTH every morning and TAKE ONE TABLET BY MOUTH everyday AT bedtime 60 tablet 10   Ginger, Zingiber officinalis, (GINGER PO) Take by mouth.     hydroxychloroquine (PLAQUENIL) 200 MG tablet Take 200 mg by mouth 2 (two) times daily.     losartan (COZAAR) 50 MG  tablet Take 1 tablet (50 mg total) by mouth every morning. 90 tablet 1   Magnesium 250 MG TABS Take 250 mg by mouth daily.     metFORMIN (GLUCOPHAGE) 500 MG tablet Take 1 tablet (500 mg total) by mouth at bedtime. 90 tablet 1   methocarbamol (ROBAXIN) 500 MG tablet Take 1 tablet (500 mg total) by mouth every 8 (eight) hours as needed for muscle spasms. (Patient not taking: Reported on 12/02/2022) 30 tablet 0   metoprolol succinate (TOPROL-XL) 50 MG 24 hr tablet Take 1 tablet (50 mg total) by mouth daily. Take with or immediately following a meal. 90 tablet 3   Omega 3-6-9 Fatty Acids (TRIPLE OMEGA COMPLEX PO) Take 1 capsule by mouth daily.     omeprazole (PRILOSEC) 40 MG capsule TAKE ONE CAPSULE BY MOUTH ONCE DAILY 90 capsule 1   ondansetron (ZOFRAN-ODT) 8 MG disintegrating tablet DISSOLVE 1 TABLET IN MOUTH EVERY 8 HOURS AS NEEDED FOR NAUSEA FOR VOMITING (Patient not taking: Reported on 12/02/2022) 30 tablet 0   rosuvastatin (CRESTOR) 20 MG tablet TAKE ONE TABLET BY MOUTH EVERYDAY AT BEDTIME 90 tablet 3   Semaglutide, 2 MG/DOSE, 8 MG/3ML SOPN Inject 2 mg as directed once a week. 9 mL 3   sucralfate (CARAFATE) 1 g tablet Take 1 tablet (1 g total) by mouth 4 (four) times daily -  with meals and at bedtime. As needed (Patient not  taking: Reported on 12/02/2022) 40 tablet 1   traMADol (ULTRAM) 50 MG tablet Take 1 tablet (50 mg total) by mouth every 12 (twelve) hours as needed for severe pain. (Patient not taking: Reported on 12/02/2022) 30 tablet 0   TURMERIC PO Take 1 capsule by mouth daily.     Vitamin A 2400 MCG (8000 UT) CAPS Take 2,400 Units by mouth daily.     No current facility-administered medications for this visit.     Past Medical History:  Diagnosis Date   Aortic atherosclerosis (HCC)    Arthritis    Cardiomyopathy (HCC)    Diabetes mellitus without complication (HCC)    Dyspnea    Dysrhythmia    Headache    Heart murmur    at birth   Hypertension    Internal hemorrhoids    Lupus  (HCC)    PUD (peptic ulcer disease)    Stroke (HCC) 08/13/2018   Vertigo     Past Surgical History:  Procedure Laterality Date   BACK SURGERY     CARDIOVERSION N/A 07/03/2022   Procedure: CARDIOVERSION;  Surgeon: Little Ishikawa, MD;  Location: [facility] ENDOSCOPY;  Service: Cardiovascular;  Laterality: N/A;   CARPAL TUNNEL RELEASE     COLONOSCOPY     KNEE CARTILAGE SURGERY     NECK SURGERY     2015   UPPER GASTROINTESTINAL ENDOSCOPY      Social History   Socioeconomic History   Marital status: Single    Spouse name: Not on file   Number of children: 1   Years of education: 12   Highest education level: Not on file  Occupational History   Occupation: retired    Comment: retired  Tobacco Use   Smoking status: Former    Types: Cigarettes    Quit date: 07/21/2004    Years since quitting: 18.4   Smokeless tobacco: Never  Vaping Use   Vaping Use: Never used  Substance and Sexual Activity   Alcohol use: Not Currently    Comment: occasional   Drug use: No   Sexual activity: Not on file  Other Topics Concern   Not on file  Social History Narrative   Lives one level with son/grandchildren; Left handed; high school grad; retired; walks for exercise; no caffeine   Social Determinants of Health   Financial Resource Strain: Low Risk  (09/06/2021)   Overall Financial Resource Strain (CARDIA)    Difficulty of Paying Living Expenses: Not hard at all  Food Insecurity: No Food Insecurity (12/25/2020)   Hunger Vital Sign    Worried About Running Out of Food in the Last Year: Never true    Ran Out of Food in the Last Year: Never true  Transportation Needs: No Transportation Needs (12/25/2020)   PRAPARE - Administrator, Civil Service (Medical): No    Lack of Transportation (Non-Medical): No  Physical Activity: Insufficiently Active (02/21/2021)   Exercise Vital Sign    Days of Exercise per Week: 2 days    Minutes of Exercise per Session: 10 min  Stress: No Stress  Concern Present (12/25/2020)   [employer] of Occupational Health - Occupational Stress Questionnaire    Feeling of Stress : Not at all  Social Connections: Moderately Isolated (12/25/2020)   Social Connection and Isolation Panel [NHANES]    Frequency of Communication with Friends and Family: More than three times a week    Frequency of Social Gatherings with Friends and Family: More than three times a  week    Attends Religious Services: More than 4 times per year    Active Member of Clubs or Organizations: No    Attends Banker Meetings: Never    Marital Status: Never married  Intimate Partner Violence: Not At Risk (12/25/2020)   Humiliation, Afraid, Rape, and Kick questionnaire    Fear of Current or Ex-Partner: No    Emotionally Abused: No    Physically Abused: No    Sexually Abused: No    Family History  Problem Relation Age of Onset   Hypertension Mother    Arthritis Mother    Heart failure Mother    Stroke Mother    Kidney disease Father    Hypertension Father    Colon cancer Neg Hx    Esophageal cancer Neg Hx    Rectal cancer Neg Hx    Stomach cancer Neg Hx     ROS: no fevers or chills, productive cough, hemoptysis, dysphasia, odynophagia, melena, hematochezia, dysuria, hematuria, rash, seizure activity, orthopnea, PND, pedal edema, claudication. Remaining systems are negative.  Physical Exam: Well-developed well-nourished in no acute distress.  Skin is warm and dry.  HEENT is normal.  Neck is supple.  Chest is clear to auscultation with normal expansion.  Cardiovascular exam is regular rate and rhythm.  Abdominal exam nontender or distended. No masses palpated. Extremities show no edema. 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 Continue ARB and beta-blocker.  As outlined previously she has a history of CVA which may have been secondary to intracranial vascular disease  but may have also been secondary to noncompaction. Continue apixaban.  She does not have a family history of sudden death and she has never had syncope.   2 paroxysmal atrial fibrillation-patient remains in sinus; continue toprol and apixaban. If she has more frequent episodes in the future she would likely need an antiarrhythmic versus referral for ablation.     3 chronic diastolic congestive heart failure-euvolemic; continue lasix at present dose.   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  Continue ARB and beta-blocker.  LV function improved on most recent echo.   5 hyperlipidemia-continue statin.   6 prior CVA-continue apixaban as outlined.  Olga Millers, MD

## 2022-12-15 ENCOUNTER — Ambulatory Visit: Payer: Self-pay | Admitting: Student

## 2022-12-15 NOTE — H&P (Signed)
TOTAL KNEE ADMISSION H&P  Patient is being admitted for left total knee arthroplasty.  Subjective:  Chief Complaint:left knee pain.  HPI: Amanda Saunders, 67 y.o. female, has a history of pain and functional disability in the left knee due to arthritis and has failed non-surgical conservative treatments for greater than 12 weeks to includeNSAID's and/or analgesics, corticosteriod injections, viscosupplementation injections, flexibility and strengthening excercises, use of assistive devices, and activity modification.  Onset of symptoms was gradual, starting 5 years ago with rapidlly worsening course since that time. The patient noted no past surgery on the left knee(s).  Patient currently rates pain in the left knee(s) at 10 out of 10 with activity. Patient has night pain, worsening of pain with activity and weight bearing, pain that interferes with activities of daily living, pain with passive range of motion, crepitus, and joint swelling.  Patient has evidence of subchondral cysts, subchondral sclerosis, periarticular osteophytes, and joint space narrowing by imaging studies. There is no active infection.  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   Systemic lupus erythematosus (HCC) 08/04/2019   Acute ischemic stroke (HCC) 08/14/2018   HTN (hypertension) 08/14/2018   Controlled type 2 diabetes mellitus without complication, without long-term current use of insulin (HCC) 04/03/2016   History of CVA (cerebrovascular accident) 03/12/2016   Chest pain at rest 03/12/2016   Peripheral edema 03/12/2016   Past Medical History:  Diagnosis Date   Aortic atherosclerosis (HCC)    Arthritis    Cardiomyopathy (HCC)    Diabetes mellitus without complication (HCC)    Dyspnea     Dysrhythmia    Headache    Heart murmur    at birth   Hypertension    Internal hemorrhoids    Lupus (HCC)    PUD (peptic ulcer disease)    Stroke (HCC) 08/13/2018   Vertigo     Past Surgical History:  Procedure Laterality Date   BACK SURGERY     CARDIOVERSION N/A 07/03/2022   Procedure: CARDIOVERSION;  Surgeon: Little Ishikawa, MD;  Location: [facility] ENDOSCOPY;  Service: Cardiovascular;  Laterality: N/A;   CARPAL TUNNEL RELEASE     COLONOSCOPY     KNEE CARTILAGE SURGERY     NECK SURGERY     2015   UPPER GASTROINTESTINAL ENDOSCOPY      Current Outpatient Medications  Medication Sig Dispense Refill Last Dose   acetaminophen (TYLENOL) 650 MG CR tablet Take 1,300 mg by mouth every 8 (eight) hours as needed for pain.      belimumab (BENLYSTA) 400 MG SOLR injection Inject into the vein every 30 (thirty) days.      Cholecalciferol (VITAMIN D-3) 125 MCG (5000 UT) TABS Take 5,000 Units by mouth daily.      CINNAMON PO Take 1 capsule by mouth daily.      colchicine 0.6 MG tablet Take 1 tablet (0.6 mg total) by mouth 2 (two) times daily. (Patient not taking: Reported on 12/02/2022) 8 tablet 0    diclofenac Sodium (VOLTAREN) 1 % GEL Apply 4 g topically 4 (four) times daily as needed. 500 g 6    ELIQUIS 5 MG TABS tablet TAKE ONE TABLET BY MOUTH EVERY MORNING and TAKE ONE TABLET BY MOUTH EVERYDAY AT BEDTIME 180 tablet 1    furosemide (LASIX) 20 MG tablet  Take 1 tablet once daily and may take an extra tablet as needed for shortness of breath or weight gain 180 tablet 3    gabapentin (NEURONTIN) 800 MG tablet TAKE ONE TABLET BY MOUTH every morning and TAKE ONE TABLET BY MOUTH everyday AT bedtime 60 tablet 10    Ginger, Zingiber officinalis, (GINGER PO) Take by mouth.      hydroxychloroquine (PLAQUENIL) 200 MG tablet Take 200 mg by mouth 2 (two) times daily.      losartan (COZAAR) 50 MG tablet Take 1 tablet (50 mg total) by mouth every morning. 90 tablet 1    Magnesium 250 MG TABS Take 250  mg by mouth daily.      metFORMIN (GLUCOPHAGE) 500 MG tablet Take 1 tablet (500 mg total) by mouth at bedtime. 90 tablet 1    methocarbamol (ROBAXIN) 500 MG tablet Take 1 tablet (500 mg total) by mouth every 8 (eight) hours as needed for muscle spasms. (Patient not taking: Reported on 12/02/2022) 30 tablet 0    metoprolol succinate (TOPROL-XL) 50 MG 24 hr tablet Take 1 tablet (50 mg total) by mouth daily. Take with or immediately following a meal. 90 tablet 3    Omega 3-6-9 Fatty Acids (TRIPLE OMEGA COMPLEX PO) Take 1 capsule by mouth daily.      omeprazole (PRILOSEC) 40 MG capsule TAKE ONE CAPSULE BY MOUTH ONCE DAILY 90 capsule 1    ondansetron (ZOFRAN-ODT) 8 MG disintegrating tablet DISSOLVE 1 TABLET IN MOUTH EVERY 8 HOURS AS NEEDED FOR NAUSEA FOR VOMITING (Patient not taking: Reported on 12/02/2022) 30 tablet 0    rosuvastatin (CRESTOR) 20 MG tablet TAKE ONE TABLET BY MOUTH EVERYDAY AT BEDTIME 90 tablet 3    Semaglutide, 2 MG/DOSE, 8 MG/3ML SOPN Inject 2 mg as directed once a week. 9 mL 3    sucralfate (CARAFATE) 1 g tablet Take 1 tablet (1 g total) by mouth 4 (four) times daily -  with meals and at bedtime. As needed (Patient not taking: Reported on 12/02/2022) 40 tablet 1    traMADol (ULTRAM) 50 MG tablet Take 1 tablet (50 mg total) by mouth every 12 (twelve) hours as needed for severe pain. (Patient not taking: Reported on 12/02/2022) 30 tablet 0    TURMERIC PO Take 1 capsule by mouth daily.      Vitamin A 2400 MCG (8000 UT) CAPS Take 2,400 Units by mouth daily.      No current facility-administered medications for this visit.   Allergies  Allergen Reactions   Pholcodine Nausea Only   Codeine Nausea And Vomiting    ALL FORMS    Social History   Tobacco Use   Smoking status: Former    Types: Cigarettes    Quit date: 07/21/2004    Years since quitting: 18.4   Smokeless tobacco: Never  Substance Use Topics   Alcohol use: Not Currently    Comment: occasional    Family History  Problem  Relation Age of Onset   Hypertension Mother    Arthritis Mother    Heart failure Mother    Stroke Mother    Kidney disease Father    Hypertension Father    Colon cancer Neg Hx    Esophageal cancer Neg Hx    Rectal cancer Neg Hx    Stomach cancer Neg Hx      Review of Systems  Musculoskeletal:  Positive for arthralgias, gait problem and joint swelling.  All other systems reviewed and are negative.   Objective:  Physical Exam Constitutional:      Appearance: Normal appearance.  HENT:     Head: Normocephalic and atraumatic.     Nose: Nose normal.     Mouth/Throat:     Mouth: Mucous membranes are moist.     Pharynx: Oropharynx is clear.  Eyes:     Conjunctiva/sclera: Conjunctivae normal.  Cardiovascular:     Rate and Rhythm: Normal rate and regular rhythm.     Pulses: Normal pulses.     Heart sounds: Normal heart sounds.  Pulmonary:     Effort: Pulmonary effort is normal.     Breath sounds: Normal breath sounds.  Abdominal:     General: Abdomen is flat.     Palpations: Abdomen is soft.  Genitourinary:    Comments: Deferred. Musculoskeletal:     Cervical back: Normal range of motion and neck supple.     Comments: Examination of the left knee reveals no skin wounds or lesions. She has swelling, trace effusion. No warmth or erythema. Mild varus deformity. Tenderness to palpation medial joint line, lateral joint line, peripatellar retinacular tissues with a positive grind sign. Her range of motion is 10 to 120 degrees without any ligamentous instability. Painless range of motion of the hip.  Distally, there is no focal motor or sensory deficit. She has palpable pedal pulses.  She ambulates with an antalgic gait.  Skin:    General: Skin is warm and dry.     Capillary Refill: Capillary refill takes less than 2 seconds.  Neurological:     General: No focal deficit present.     Mental Status: She is alert and oriented to person, place, and time.  Psychiatric:        Mood  and Affect: Mood normal.        Behavior: Behavior normal.        Thought Content: Thought content normal.        Judgment: Judgment normal.     Vital signs in last 24 hours: @VSRANGES @  Labs:   Estimated body mass index is 30.82 kg/m as calculated from the following:   Height as of 12/04/22: 5\' 3"  (1.6 m).   Weight as of 12/04/22: 78.9 kg.   Imaging Review Plain radiographs demonstrate severe degenerative joint disease of the left knee(s). The overall alignment issignificant varus. The bone quality appears to be adequate for age and reported activity level.      Assessment/Plan:  End stage arthritis, left knee   The patient history, physical examination, clinical judgment of the provider and imaging studies are consistent with end stage degenerative joint disease of the left knee(s) and total knee arthroplasty is deemed medically necessary. The treatment options including medical management, injection therapy arthroscopy and arthroplasty were discussed at length. The risks and benefits of total knee arthroplasty were presented and reviewed. The risks due to aseptic loosening, infection, stiffness, patella tracking problems, thromboembolic complications and other imponderables were discussed. The patient acknowledged the explanation, agreed to proceed with the plan and consent was signed. Patient is being admitted for inpatient treatment for surgery, pain control, PT, OT, prophylactic antibiotics, VTE prophylaxis, progressive ambulation and ADL's and discharge planning. The patient is planning to be discharged home with OPPT after an overnight stay.   Therapy Plans: outpatient therapy. PT at [facility], 12/22/22.  Disposition: Home with Felicity Pellegrini, husband Planned DVT Prophylaxis: Eliquis 5mg  BID DME needed: walker. Patient will consider ice machine.  PCP: Cleared Cardiology: Cleared. Hold Eliquis 3 days prior to  surgery.  TXA: IV Allergies:  - Codeine -  nausea/vomiting - Hydrocodone - nausea/vomiting - Oxycodone - nausea/vomiting  Anesthesia Concerns: None.  BMI: 30.2 Last HgbA1c: 6.0  Other: - A-fib, s/p cardioversion, NSR, chronic Eliquis use. - CVA history  - SLE, benlysta, last injection April 30th. Scheduled to get benlysta injection 12/16/22 recommend to hold medication for optimal wound healing. Staples** - PUD, NO NSAIDs - T2DM - Hold semaglutide 7 days prior to surgery.  - Patient says hydrocodone and oxycodone cause N/V - dilaudid vs tramadol??, zofran, methocarbamol.  - 12/04/22: Hgb 11.0, K+ 3.9, Cr. 0.83.     Patient's anticipated LOS is less than 2 midnights, meeting these requirements: - Younger than 74 - Lives within 1 hour of care - Has a competent adult at home to recover with post-op recover - NO history of  - Chronic pain requiring opiods  - Diabetes  - Coronary Artery Disease  - Heart failure  - Heart attack  - Stroke  - DVT/VTE  - Cardiac arrhythmia  - Respiratory Failure/COPD  - Renal failure  - Anemia  - Advanced Liver disease

## 2022-12-15 NOTE — H&P (View-Only) (Signed)
TOTAL KNEE ADMISSION H&P  Patient is being admitted for left total knee arthroplasty.  Subjective:  Chief Complaint:left knee pain.  HPI: Amanda Saunders, 67 y.o. female, has a history of pain and functional disability in the left knee due to arthritis and has failed non-surgical conservative treatments for greater than 12 weeks to includeNSAID's and/or analgesics, corticosteriod injections, viscosupplementation injections, flexibility and strengthening excercises, use of assistive devices, and activity modification.  Onset of symptoms was gradual, starting 5 years ago with rapidlly worsening course since that time. The patient noted no past surgery on the left knee(s).  Patient currently rates pain in the left knee(s) at 10 out of 10 with activity. Patient has night pain, worsening of pain with activity and weight bearing, pain that interferes with activities of daily living, pain with passive range of motion, crepitus, and joint swelling.  Patient has evidence of subchondral cysts, subchondral sclerosis, periarticular osteophytes, and joint space narrowing by imaging studies. There is no active infection.  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   Systemic lupus erythematosus (HCC) 08/04/2019   Acute ischemic stroke (HCC) 08/14/2018   HTN (hypertension) 08/14/2018   Controlled type 2 diabetes mellitus without complication, without long-term current use of insulin (HCC) 04/03/2016   History of CVA (cerebrovascular accident) 03/12/2016   Chest pain at rest 03/12/2016   Peripheral edema 03/12/2016   Past Medical History:  Diagnosis Date   Aortic atherosclerosis (HCC)    Arthritis    Cardiomyopathy (HCC)    Diabetes mellitus without complication (HCC)    Dyspnea     Dysrhythmia    Headache    Heart murmur    at birth   Hypertension    Internal hemorrhoids    Lupus (HCC)    PUD (peptic ulcer disease)    Stroke (HCC) 08/13/2018   Vertigo     Past Surgical History:  Procedure Laterality Date   BACK SURGERY     CARDIOVERSION N/A 07/03/2022   Procedure: CARDIOVERSION;  Surgeon: Schumann, Christopher L, MD;  Location: [facility] ENDOSCOPY;  Service: Cardiovascular;  Laterality: N/A;   CARPAL TUNNEL RELEASE     COLONOSCOPY     KNEE CARTILAGE SURGERY     NECK SURGERY     2015   UPPER GASTROINTESTINAL ENDOSCOPY      Current Outpatient Medications  Medication Sig Dispense Refill Last Dose   acetaminophen (TYLENOL) 650 MG CR tablet Take 1,300 mg by mouth every 8 (eight) hours as needed for pain.      belimumab (BENLYSTA) 400 MG SOLR injection Inject into the vein every 30 (thirty) days.      Cholecalciferol (VITAMIN D-3) 125 MCG (5000 UT) TABS Take 5,000 Units by mouth daily.      CINNAMON PO Take 1 capsule by mouth daily.      colchicine 0.6 MG tablet Take 1 tablet (0.6 mg total) by mouth 2 (two) times daily. (Patient not taking: Reported on 12/02/2022) 8 tablet 0    diclofenac Sodium (VOLTAREN) 1 % GEL Apply 4 g topically 4 (four) times daily as needed. 500 g 6    ELIQUIS 5 MG TABS tablet TAKE ONE TABLET BY MOUTH EVERY MORNING and TAKE ONE TABLET BY MOUTH EVERYDAY AT BEDTIME 180 tablet 1    furosemide (LASIX) 20 MG tablet   Take 1 tablet once daily and may take an extra tablet as needed for shortness of breath or weight gain 180 tablet 3    gabapentin (NEURONTIN) 800 MG tablet TAKE ONE TABLET BY MOUTH every morning and TAKE ONE TABLET BY MOUTH everyday AT bedtime 60 tablet 10    Ginger, Zingiber officinalis, (GINGER PO) Take by mouth.      hydroxychloroquine (PLAQUENIL) 200 MG tablet Take 200 mg by mouth 2 (two) times daily.      losartan (COZAAR) 50 MG tablet Take 1 tablet (50 mg total) by mouth every morning. 90 tablet 1    Magnesium 250 MG TABS Take 250  mg by mouth daily.      metFORMIN (GLUCOPHAGE) 500 MG tablet Take 1 tablet (500 mg total) by mouth at bedtime. 90 tablet 1    methocarbamol (ROBAXIN) 500 MG tablet Take 1 tablet (500 mg total) by mouth every 8 (eight) hours as needed for muscle spasms. (Patient not taking: Reported on 12/02/2022) 30 tablet 0    metoprolol succinate (TOPROL-XL) 50 MG 24 hr tablet Take 1 tablet (50 mg total) by mouth daily. Take with or immediately following a meal. 90 tablet 3    Omega 3-6-9 Fatty Acids (TRIPLE OMEGA COMPLEX PO) Take 1 capsule by mouth daily.      omeprazole (PRILOSEC) 40 MG capsule TAKE ONE CAPSULE BY MOUTH ONCE DAILY 90 capsule 1    ondansetron (ZOFRAN-ODT) 8 MG disintegrating tablet DISSOLVE 1 TABLET IN MOUTH EVERY 8 HOURS AS NEEDED FOR NAUSEA FOR VOMITING (Patient not taking: Reported on 12/02/2022) 30 tablet 0    rosuvastatin (CRESTOR) 20 MG tablet TAKE ONE TABLET BY MOUTH EVERYDAY AT BEDTIME 90 tablet 3    Semaglutide, 2 MG/DOSE, 8 MG/3ML SOPN Inject 2 mg as directed once a week. 9 mL 3    sucralfate (CARAFATE) 1 g tablet Take 1 tablet (1 g total) by mouth 4 (four) times daily -  with meals and at bedtime. As needed (Patient not taking: Reported on 12/02/2022) 40 tablet 1    traMADol (ULTRAM) 50 MG tablet Take 1 tablet (50 mg total) by mouth every 12 (twelve) hours as needed for severe pain. (Patient not taking: Reported on 12/02/2022) 30 tablet 0    TURMERIC PO Take 1 capsule by mouth daily.      Vitamin A 2400 MCG (8000 UT) CAPS Take 2,400 Units by mouth daily.      No current facility-administered medications for this visit.   Allergies  Allergen Reactions   Pholcodine Nausea Only   Codeine Nausea And Vomiting    ALL FORMS    Social History   Tobacco Use   Smoking status: Former    Types: Cigarettes    Quit date: 07/21/2004    Years since quitting: 18.4   Smokeless tobacco: Never  Substance Use Topics   Alcohol use: Not Currently    Comment: occasional    Family History  Problem  Relation Age of Onset   Hypertension Mother    Arthritis Mother    Heart failure Mother    Stroke Mother    Kidney disease Father    Hypertension Father    Colon cancer Neg Hx    Esophageal cancer Neg Hx    Rectal cancer Neg Hx    Stomach cancer Neg Hx      Review of Systems  Musculoskeletal:  Positive for arthralgias, gait problem and joint swelling.  All other systems reviewed and are negative.   Objective:    Physical Exam Constitutional:      Appearance: Normal appearance.  HENT:     Head: Normocephalic and atraumatic.     Nose: Nose normal.     Mouth/Throat:     Mouth: Mucous membranes are moist.     Pharynx: Oropharynx is clear.  Eyes:     Conjunctiva/sclera: Conjunctivae normal.  Cardiovascular:     Rate and Rhythm: Normal rate and regular rhythm.     Pulses: Normal pulses.     Heart sounds: Normal heart sounds.  Pulmonary:     Effort: Pulmonary effort is normal.     Breath sounds: Normal breath sounds.  Abdominal:     General: Abdomen is flat.     Palpations: Abdomen is soft.  Genitourinary:    Comments: Deferred. Musculoskeletal:     Cervical back: Normal range of motion and neck supple.     Comments: Examination of the left knee reveals no skin wounds or lesions. She has swelling, trace effusion. No warmth or erythema. Mild varus deformity. Tenderness to palpation medial joint line, lateral joint line, peripatellar retinacular tissues with a positive grind sign. Her range of motion is 10 to 120 degrees without any ligamentous instability. Painless range of motion of the hip.  Distally, there is no focal motor or sensory deficit. She has palpable pedal pulses.  She ambulates with an antalgic gait.  Skin:    General: Skin is warm and dry.     Capillary Refill: Capillary refill takes less than 2 seconds.  Neurological:     General: No focal deficit present.     Mental Status: She is alert and oriented to person, place, and time.  Psychiatric:        Mood  and Affect: Mood normal.        Behavior: Behavior normal.        Thought Content: Thought content normal.        Judgment: Judgment normal.     Vital signs in last 24 hours: @VSRANGES@  Labs:   Estimated body mass index is 30.82 kg/m as calculated from the following:   Height as of 12/04/22: 5' 3" (1.6 m).   Weight as of 12/04/22: 78.9 kg.   Imaging Review Plain radiographs demonstrate severe degenerative joint disease of the left knee(s). The overall alignment issignificant varus. The bone quality appears to be adequate for age and reported activity level.      Assessment/Plan:  End stage arthritis, left knee   The patient history, physical examination, clinical judgment of the provider and imaging studies are consistent with end stage degenerative joint disease of the left knee(s) and total knee arthroplasty is deemed medically necessary. The treatment options including medical management, injection therapy arthroscopy and arthroplasty were discussed at length. The risks and benefits of total knee arthroplasty were presented and reviewed. The risks due to aseptic loosening, infection, stiffness, patella tracking problems, thromboembolic complications and other imponderables were discussed. The patient acknowledged the explanation, agreed to proceed with the plan and consent was signed. Patient is being admitted for inpatient treatment for surgery, pain control, PT, OT, prophylactic antibiotics, VTE prophylaxis, progressive ambulation and ADL's and discharge planning. The patient is planning to be discharged home with OPPT after an overnight stay.   Therapy Plans: outpatient therapy. PT at [facility], 12/22/22.  Disposition: Home with Kenneth Williams, husband Planned DVT Prophylaxis: Eliquis 5mg BID DME needed: walker. Patient will consider ice machine.  PCP: Cleared Cardiology: Cleared. Hold Eliquis 3 days prior to   surgery.  TXA: IV Allergies:  - Codeine -  nausea/vomiting - Hydrocodone - nausea/vomiting - Oxycodone - nausea/vomiting  Anesthesia Concerns: None.  BMI: 30.2 Last HgbA1c: 6.0  Other: - A-fib, s/p cardioversion, NSR, chronic Eliquis use. - CVA history  - SLE, benlysta, last injection April 30th. Scheduled to get benlysta injection 12/16/22 recommend to hold medication for optimal wound healing. Staples** - PUD, NO NSAIDs - T2DM - Hold semaglutide 7 days prior to surgery.  - Patient says hydrocodone and oxycodone cause N/V - dilaudid vs tramadol??, zofran, methocarbamol.  - 12/04/22: Hgb 11.0, K+ 3.9, Cr. 0.83.     Patient's anticipated LOS is less than 2 midnights, meeting these requirements: - Younger than 65 - Lives within 1 hour of care - Has a competent adult at home to recover with post-op recover - NO history of  - Chronic pain requiring opiods  - Diabetes  - Coronary Artery Disease  - Heart failure  - Heart attack  - Stroke  - DVT/VTE  - Cardiac arrhythmia  - Respiratory Failure/COPD  - Renal failure  - Anemia  - Advanced Liver disease   

## 2022-12-17 ENCOUNTER — Observation Stay (HOSPITAL_COMMUNITY)
Admission: RE | Admit: 2022-12-17 | Discharge: 2022-12-20 | Disposition: A | Discharge status: Home / Self Care | Payer: 59 | Attending: Orthopedic Surgery | Admitting: Orthopedic Surgery

## 2022-12-17 ENCOUNTER — Observation Stay (HOSPITAL_COMMUNITY): Payer: 59

## 2022-12-17 ENCOUNTER — Ambulatory Visit (HOSPITAL_COMMUNITY): Payer: 59 | Admitting: Physician Assistant

## 2022-12-17 ENCOUNTER — Encounter (HOSPITAL_COMMUNITY)
Admission: RE | Disposition: A | Discharge status: Home / Self Care | Payer: Self-pay | Source: Home / Self Care | Attending: Orthopedic Surgery

## 2022-12-17 ENCOUNTER — Other Ambulatory Visit: Payer: Self-pay

## 2022-12-17 ENCOUNTER — Ambulatory Visit (HOSPITAL_BASED_OUTPATIENT_CLINIC_OR_DEPARTMENT_OTHER): Payer: 59 | Admitting: Anesthesiology

## 2022-12-17 ENCOUNTER — Encounter (HOSPITAL_COMMUNITY): Payer: Self-pay | Admitting: Orthopedic Surgery

## 2022-12-17 DIAGNOSIS — M1712 Unilateral primary osteoarthritis, left knee: Principal | ICD-10-CM | POA: Diagnosis present

## 2022-12-17 DIAGNOSIS — Z79899 Other long term (current) drug therapy: Secondary | ICD-10-CM | POA: Insufficient documentation

## 2022-12-17 DIAGNOSIS — E119 Type 2 diabetes mellitus without complications: Secondary | ICD-10-CM | POA: Diagnosis not present

## 2022-12-17 DIAGNOSIS — I499 Cardiac arrhythmia, unspecified: Secondary | ICD-10-CM

## 2022-12-17 DIAGNOSIS — I10 Essential (primary) hypertension: Secondary | ICD-10-CM | POA: Insufficient documentation

## 2022-12-17 DIAGNOSIS — I4819 Other persistent atrial fibrillation: Secondary | ICD-10-CM | POA: Insufficient documentation

## 2022-12-17 DIAGNOSIS — Z87891 Personal history of nicotine dependence: Secondary | ICD-10-CM | POA: Insufficient documentation

## 2022-12-17 DIAGNOSIS — Z794 Long term (current) use of insulin: Secondary | ICD-10-CM | POA: Diagnosis not present

## 2022-12-17 DIAGNOSIS — Z7901 Long term (current) use of anticoagulants: Secondary | ICD-10-CM | POA: Diagnosis not present

## 2022-12-17 DIAGNOSIS — Z7984 Long term (current) use of oral hypoglycemic drugs: Secondary | ICD-10-CM | POA: Insufficient documentation

## 2022-12-17 DIAGNOSIS — G8918 Other acute postprocedural pain: Secondary | ICD-10-CM | POA: Diagnosis not present

## 2022-12-17 DIAGNOSIS — Z96652 Presence of left artificial knee joint: Secondary | ICD-10-CM

## 2022-12-17 DIAGNOSIS — Z8673 Personal history of transient ischemic attack (TIA), and cerebral infarction without residual deficits: Secondary | ICD-10-CM | POA: Insufficient documentation

## 2022-12-17 DIAGNOSIS — Z471 Aftercare following joint replacement surgery: Secondary | ICD-10-CM | POA: Diagnosis not present

## 2022-12-17 HISTORY — PX: KNEE ARTHROPLASTY: SHX992

## 2022-12-17 LAB — GLUCOSE, CAPILLARY
Glucose-Capillary: 95 mg/dL (ref 70–99)
Glucose-Capillary: 86 mg/dL (ref 70–99)
Glucose-Capillary: 176 mg/dL — ABNORMAL HIGH (ref 70–99)
Glucose-Capillary: 116 mg/dL — ABNORMAL HIGH (ref 70–99)

## 2022-12-17 SURGERY — ARTHROPLASTY, KNEE, TOTAL, USING IMAGELESS COMPUTER-ASSISTED NAVIGATION
Anesthesia: Monitor Anesthesia Care | Site: Knee | Laterality: Left

## 2022-12-17 MED ORDER — TRANEXAMIC ACID-NACL 1000-0.7 MG/100ML-% IV SOLN
1000.0000 mg | INTRAVENOUS | Status: AC
  Administered 2022-12-17: 1000 mg via INTRAVENOUS
  Filled 2022-12-17: qty 100

## 2022-12-17 MED ORDER — MENTHOL 3 MG MT LOZG: 1.0000 | LOZENGE | OROMUCOSAL | Status: DC | PRN

## 2022-12-17 MED ORDER — BISACODYL 10 MG RE SUPP: 10.0000 mg | Freq: Every day | RECTAL | Status: DC | PRN

## 2022-12-17 MED ORDER — ACETAMINOPHEN 500 MG PO TABS
1000.0000 mg | ORAL_TABLET | Freq: Once | ORAL | Status: AC
  Filled 2022-12-17: qty 2

## 2022-12-17 MED ORDER — TRAMADOL HCL 50 MG PO TABS
50.0000 mg | ORAL_TABLET | Freq: Four times a day (QID) | ORAL | Status: DC
  Administered 2022-12-17 – 2022-12-19 (×7): 50 mg via ORAL
  Filled 2022-12-17 (×7): qty 1

## 2022-12-17 MED ORDER — ACETAMINOPHEN 500 MG PO TABS
1000.0000 mg | ORAL_TABLET | Freq: Four times a day (QID) | ORAL | Status: AC
  Administered 2022-12-17 – 2022-12-18 (×4): 1000 mg via ORAL
  Filled 2022-12-17 (×4): qty 2

## 2022-12-17 MED ORDER — LACTATED RINGERS IV SOLN: INTRAVENOUS | Status: DC

## 2022-12-17 MED ORDER — PROPOFOL 10 MG/ML IV BOLUS
INTRAVENOUS | Status: DC | PRN
  Administered 2022-12-17: 100 ug/kg/min via INTRAVENOUS

## 2022-12-17 MED ORDER — APIXABAN 2.5 MG PO TABS
2.5000 mg | ORAL_TABLET | Freq: Two times a day (BID) | ORAL | Status: DC
  Administered 2022-12-18 – 2022-12-20 (×5): 2.5 mg via ORAL
  Filled 2022-12-17 (×5): qty 1

## 2022-12-17 MED ORDER — PROPOFOL 500 MG/50ML IV EMUL
INTRAVENOUS | Status: AC
  Filled 2022-12-17: qty 100

## 2022-12-17 MED ORDER — CHLORHEXIDINE GLUCONATE 0.12 % MT SOLN
15.0000 mL | Freq: Once | OROMUCOSAL | Status: AC
  Administered 2022-12-17: 15 mL via OROMUCOSAL

## 2022-12-17 MED ORDER — ONDANSETRON HCL 4 MG/2ML IJ SOLN
4.0000 mg | Freq: Four times a day (QID) | INTRAMUSCULAR | Status: DC | PRN
  Administered 2022-12-18: 4 mg via INTRAVENOUS
  Filled 2022-12-17: qty 2

## 2022-12-17 MED ORDER — POVIDONE-IODINE 10 % EX SWAB: 2.0000 | Freq: Once | CUTANEOUS | Status: DC

## 2022-12-17 MED ORDER — BUPIVACAINE IN DEXTROSE 0.75-8.25 % IT SOLN
INTRATHECAL | Status: DC | PRN
  Administered 2022-12-17: 1.6 mL via INTRATHECAL

## 2022-12-17 MED ORDER — METOPROLOL SUCCINATE ER 50 MG PO TB24
50.0000 mg | ORAL_TABLET | Freq: Every day | ORAL | Status: DC
  Administered 2022-12-17 – 2022-12-20 (×4): 50 mg via ORAL
  Filled 2022-12-17 (×4): qty 1

## 2022-12-17 MED ORDER — SODIUM CHLORIDE (PF) 0.9 % IJ SOLN
INTRAMUSCULAR | Status: AC
  Filled 2022-12-17: qty 50

## 2022-12-17 MED ORDER — OXYCODONE HCL 5 MG PO TABS: 10.0000 mg | ORAL_TABLET | ORAL | Status: DC | PRN

## 2022-12-17 MED ORDER — ONDANSETRON HCL 4 MG PO TABS
4.0000 mg | ORAL_TABLET | Freq: Four times a day (QID) | ORAL | Status: DC | PRN
  Administered 2022-12-18 – 2022-12-19 (×2): 4 mg via ORAL
  Filled 2022-12-17 (×2): qty 1

## 2022-12-17 MED ORDER — DIPHENHYDRAMINE HCL 12.5 MG/5ML PO ELIX: 12.5000 mg | ORAL_SOLUTION | ORAL | Status: DC | PRN

## 2022-12-17 MED ORDER — MIDAZOLAM HCL 2 MG/2ML IJ SOLN
INTRAMUSCULAR | Status: DC | PRN
  Administered 2022-12-17 (×2): 1 mg via INTRAVENOUS

## 2022-12-17 MED ORDER — SENNA 8.6 MG PO TABS
1.0000 | ORAL_TABLET | Freq: Two times a day (BID) | ORAL | Status: DC
  Administered 2022-12-17 – 2022-12-20 (×7): 8.6 mg via ORAL
  Filled 2022-12-17 (×7): qty 1

## 2022-12-17 MED ORDER — METOCLOPRAMIDE HCL 5 MG PO TABS
5.0000 mg | ORAL_TABLET | Freq: Three times a day (TID) | ORAL | Status: DC | PRN
  Administered 2022-12-18: 10 mg via ORAL
  Filled 2022-12-17: qty 2

## 2022-12-17 MED ORDER — FENTANYL CITRATE (PF) 250 MCG/5ML IJ SOLN
INTRAMUSCULAR | Status: AC
  Filled 2022-12-17: qty 5

## 2022-12-17 MED ORDER — 0.9 % SODIUM CHLORIDE (POUR BTL) OPTIME
TOPICAL | Status: DC | PRN
  Administered 2022-12-17: 1000 mL

## 2022-12-17 MED ORDER — METHOCARBAMOL 500 MG PO TABS
500.0000 mg | ORAL_TABLET | Freq: Four times a day (QID) | ORAL | Status: DC | PRN
  Administered 2022-12-17 – 2022-12-18 (×4): 500 mg via ORAL
  Filled 2022-12-17 (×4): qty 1

## 2022-12-17 MED ORDER — MEPERIDINE HCL 50 MG/ML IJ SOLN: 6.2500 mg | INTRAMUSCULAR | Status: DC | PRN

## 2022-12-17 MED ORDER — SODIUM CHLORIDE (PF) 0.9 % IJ SOLN
INTRAMUSCULAR | Status: DC | PRN
  Administered 2022-12-17: 30 mL via INTRAVENOUS

## 2022-12-17 MED ORDER — SODIUM CHLORIDE 0.9 % IR SOLN
Status: DC | PRN
  Administered 2022-12-17: 1000 mL
  Administered 2022-12-17: 3000 mL

## 2022-12-17 MED ORDER — MIDAZOLAM HCL 2 MG/2ML IJ SOLN
INTRAMUSCULAR | Status: AC
  Filled 2022-12-17: qty 2

## 2022-12-17 MED ORDER — SODIUM CHLORIDE 0.9 % IV SOLN: INTRAVENOUS | Status: DC

## 2022-12-17 MED ORDER — ACETAMINOPHEN 325 MG PO TABS
325.0000 mg | ORAL_TABLET | Freq: Four times a day (QID) | ORAL | Status: DC | PRN
  Administered 2022-12-18 – 2022-12-20 (×3): 650 mg via ORAL
  Filled 2022-12-17 (×3): qty 2

## 2022-12-17 MED ORDER — ORAL CARE MOUTH RINSE: 15.0000 mL | Freq: Once | OROMUCOSAL | Status: AC

## 2022-12-17 MED ORDER — ACETAMINOPHEN 500 MG PO TABS
1000.0000 mg | ORAL_TABLET | Freq: Once | ORAL | Status: AC
  Administered 2022-12-17: 1000 mg via ORAL

## 2022-12-17 MED ORDER — CEFAZOLIN SODIUM-DEXTROSE 2-4 GM/100ML-% IV SOLN: 2.0000 g | INTRAVENOUS | Status: DC

## 2022-12-17 MED ORDER — ISOPROPYL ALCOHOL 70 % SOLN
Status: DC | PRN
  Administered 2022-12-17: 1 via TOPICAL

## 2022-12-17 MED ORDER — BUPIVACAINE-EPINEPHRINE 0.25% -1:200000 IJ SOLN
INTRAMUSCULAR | Status: DC | PRN
  Administered 2022-12-17: 30 mL

## 2022-12-17 MED ORDER — TRANEXAMIC ACID-NACL 1000-0.7 MG/100ML-% IV SOLN: 1000.0000 mg | INTRAVENOUS | Status: DC

## 2022-12-17 MED ORDER — PHENOL 1.4 % MT LIQD: 1.0000 | OROMUCOSAL | Status: DC | PRN

## 2022-12-17 MED ORDER — KETOROLAC TROMETHAMINE 30 MG/ML IJ SOLN
INTRAMUSCULAR | Status: DC | PRN
  Administered 2022-12-17: 30 mg via INTRAMUSCULAR

## 2022-12-17 MED ORDER — ACETAMINOPHEN 325 MG PO TABS: 325.0000 mg | ORAL_TABLET | ORAL | Status: DC | PRN

## 2022-12-17 MED ORDER — ONDANSETRON HCL 4 MG/2ML IJ SOLN
INTRAMUSCULAR | Status: AC
  Filled 2022-12-17: qty 2

## 2022-12-17 MED ORDER — KETOROLAC TROMETHAMINE 30 MG/ML IJ SOLN
INTRAMUSCULAR | Status: AC
  Filled 2022-12-17: qty 1

## 2022-12-17 MED ORDER — ONDANSETRON HCL 4 MG/2ML IJ SOLN: 4.0000 mg | Freq: Once | INTRAMUSCULAR | Status: DC | PRN

## 2022-12-17 MED ORDER — INSULIN ASPART 100 UNIT/ML IJ SOLN: 0.0000 [IU] | Freq: Every day | INTRAMUSCULAR | Status: DC

## 2022-12-17 MED ORDER — METHOCARBAMOL 500 MG IVPB - SIMPLE MED: 500.0000 mg | Freq: Four times a day (QID) | INTRAVENOUS | Status: DC | PRN

## 2022-12-17 MED ORDER — ALUM & MAG HYDROXIDE-SIMETH 200-200-20 MG/5ML PO SUSP: 30.0000 mL | ORAL | Status: DC | PRN

## 2022-12-17 MED ORDER — ROSUVASTATIN CALCIUM 20 MG PO TABS
20.0000 mg | ORAL_TABLET | Freq: Every day | ORAL | Status: DC
  Administered 2022-12-17 – 2022-12-19 (×3): 20 mg via ORAL
  Filled 2022-12-17 (×3): qty 1

## 2022-12-17 MED ORDER — OXYCODONE HCL 5 MG PO TABS: 5.0000 mg | ORAL_TABLET | Freq: Once | ORAL | Status: DC | PRN

## 2022-12-17 MED ORDER — ACETAMINOPHEN 160 MG/5ML PO SOLN: 325.0000 mg | ORAL | Status: DC | PRN

## 2022-12-17 MED ORDER — PHENYLEPHRINE 80 MCG/ML (10ML) SYRINGE FOR IV PUSH (FOR BLOOD PRESSURE SUPPORT)
PREFILLED_SYRINGE | INTRAVENOUS | Status: AC
  Filled 2022-12-17: qty 10

## 2022-12-17 MED ORDER — DOCUSATE SODIUM 100 MG PO CAPS
100.0000 mg | ORAL_CAPSULE | Freq: Two times a day (BID) | ORAL | Status: DC
  Administered 2022-12-17 – 2022-12-20 (×7): 100 mg via ORAL
  Filled 2022-12-17 (×7): qty 1

## 2022-12-17 MED ORDER — ONDANSETRON HCL 4 MG/2ML IJ SOLN
INTRAMUSCULAR | Status: DC | PRN
  Administered 2022-12-17: 4 mg via INTRAVENOUS

## 2022-12-17 MED ORDER — GABAPENTIN 400 MG PO CAPS
800.0000 mg | ORAL_CAPSULE | Freq: Two times a day (BID) | ORAL | Status: DC
  Administered 2022-12-17 – 2022-12-19 (×6): 800 mg via ORAL
  Filled 2022-12-17 (×6): qty 2

## 2022-12-17 MED ORDER — EPINEPHRINE PF 1 MG/ML IJ SOLN
INTRAMUSCULAR | Status: AC
  Filled 2022-12-17: qty 1

## 2022-12-17 MED ORDER — OXYCODONE HCL 5 MG PO TABS
5.0000 mg | ORAL_TABLET | ORAL | Status: DC | PRN
  Administered 2022-12-17: 5 mg via ORAL
  Administered 2022-12-18: 10 mg via ORAL
  Filled 2022-12-17: qty 1
  Filled 2022-12-17: qty 2

## 2022-12-17 MED ORDER — ROPIVACAINE HCL 5 MG/ML IJ SOLN
INTRAMUSCULAR | Status: DC | PRN
  Administered 2022-12-17: 20 mL via PERINEURAL

## 2022-12-17 MED ORDER — DEXAMETHASONE SODIUM PHOSPHATE 4 MG/ML IJ SOLN
INTRAMUSCULAR | Status: DC | PRN
  Administered 2022-12-17: 8 mg via INTRAVENOUS

## 2022-12-17 MED ORDER — VANCOMYCIN HCL 1000 MG IV SOLR
INTRAVENOUS | Status: DC | PRN
  Administered 2022-12-17: 1000 mg via TOPICAL

## 2022-12-17 MED ORDER — PANTOPRAZOLE SODIUM 40 MG PO TBEC
40.0000 mg | DELAYED_RELEASE_TABLET | Freq: Every day | ORAL | Status: DC
  Administered 2022-12-17 – 2022-12-20 (×4): 40 mg via ORAL
  Filled 2022-12-17 (×4): qty 1

## 2022-12-17 MED ORDER — DEXAMETHASONE SODIUM PHOSPHATE 10 MG/ML IJ SOLN
INTRAMUSCULAR | Status: AC
  Filled 2022-12-17: qty 1

## 2022-12-17 MED ORDER — POLYETHYLENE GLYCOL 3350 17 G PO PACK: 17.0000 g | PACK | Freq: Every day | ORAL | Status: DC | PRN

## 2022-12-17 MED ORDER — HYDROMORPHONE HCL 1 MG/ML IJ SOLN: 0.5000 mg | INTRAMUSCULAR | Status: DC | PRN

## 2022-12-17 MED ORDER — FENTANYL CITRATE PF 50 MCG/ML IJ SOSY: 25.0000 ug | PREFILLED_SYRINGE | INTRAMUSCULAR | Status: DC | PRN

## 2022-12-17 MED ORDER — PROPOFOL 1000 MG/100ML IV EMUL
INTRAVENOUS | Status: AC
  Filled 2022-12-17: qty 200

## 2022-12-17 MED ORDER — BUPIVACAINE HCL (PF) 0.25 % IJ SOLN
INTRAMUSCULAR | Status: AC
  Filled 2022-12-17: qty 30

## 2022-12-17 MED ORDER — METOCLOPRAMIDE HCL 5 MG/ML IJ SOLN
5.0000 mg | Freq: Three times a day (TID) | INTRAMUSCULAR | Status: DC | PRN
  Administered 2022-12-18: 10 mg via INTRAVENOUS
  Filled 2022-12-17: qty 2

## 2022-12-17 MED ORDER — FENTANYL CITRATE (PF) 250 MCG/5ML IJ SOLN
INTRAMUSCULAR | Status: DC | PRN
  Administered 2022-12-17: 50 ug via INTRAVENOUS
  Administered 2022-12-17: 100 ug via INTRAVENOUS
  Administered 2022-12-17: 50 ug via INTRAVENOUS

## 2022-12-17 MED ORDER — PHENYLEPHRINE HCL (PRESSORS) 10 MG/ML IV SOLN
INTRAVENOUS | Status: DC | PRN
  Administered 2022-12-17 (×2): 160 ug via INTRAVENOUS
  Administered 2022-12-17: 80 ug via INTRAVENOUS

## 2022-12-17 MED ORDER — OXYCODONE HCL 5 MG/5ML PO SOLN: 5.0000 mg | Freq: Once | ORAL | Status: DC | PRN

## 2022-12-17 MED ORDER — CEFAZOLIN SODIUM-DEXTROSE 2-4 GM/100ML-% IV SOLN
2.0000 g | INTRAVENOUS | Status: AC
  Administered 2022-12-17: 2 g via INTRAVENOUS
  Filled 2022-12-17: qty 100

## 2022-12-17 MED ORDER — VANCOMYCIN HCL 1000 MG IV SOLR
INTRAVENOUS | Status: AC
  Filled 2022-12-17: qty 20

## 2022-12-17 MED ORDER — CEFAZOLIN SODIUM-DEXTROSE 2-4 GM/100ML-% IV SOLN
2.0000 g | Freq: Four times a day (QID) | INTRAVENOUS | Status: AC
  Administered 2022-12-17 (×2): 2 g via INTRAVENOUS
  Filled 2022-12-17 (×2): qty 100

## 2022-12-17 MED ORDER — INSULIN ASPART 100 UNIT/ML IJ SOLN
0.0000 [IU] | Freq: Three times a day (TID) | INTRAMUSCULAR | Status: DC
  Administered 2022-12-17: 2 [IU] via SUBCUTANEOUS
  Administered 2022-12-20: 1 [IU] via SUBCUTANEOUS

## 2022-12-17 SURGICAL SUPPLY — 82 items
ADH SKN CLS APL DERMABOND .7 (GAUZE/BANDAGES/DRESSINGS) ×2
APL PRP STRL LF DISP 70% ISPRP (MISCELLANEOUS) ×2
AUG TIB CD 5 HLF BLCK STRL LM (Joint) ×1 IMPLANT
AUG TIB HALF BLOCK SZ CD LM 5 (Joint) ×1 IMPLANT
AUG TIB PS KNEE C 5 LT (Joint) ×1 IMPLANT
AUGMENT TIB HALF BLC SZ CDLM 5 (Joint) IMPLANT
AUGMENT TIB PS KNEE C 5 LT (Joint) IMPLANT
BAG COUNTER SPONGE SURGICOUNT (BAG) IMPLANT
BAG SPEC THK2 15X12 ZIP CLS (MISCELLANEOUS)
BAG SPNG CNTER NS LX DISP (BAG)
BAG ZIPLOCK 12X15 (MISCELLANEOUS) IMPLANT
BATTERY INSTRU NAVIGATION (MISCELLANEOUS) ×3 IMPLANT
BLADE SAW RECIPROCATING 77.5 (BLADE) ×1 IMPLANT
BLADE SAW SGTL 13.0X1.19X90.0M (BLADE) IMPLANT
BNDG CMPR 5X4 KNIT ELC UNQ LF (GAUZE/BANDAGES/DRESSINGS) ×1
BNDG CMPR 5X62 HK CLSR LF (GAUZE/BANDAGES/DRESSINGS) ×1
BNDG CMPR MED 10X6 ELC LF (GAUZE/BANDAGES/DRESSINGS) ×1
BNDG ELASTIC 4INX 5YD STR LF (GAUZE/BANDAGES/DRESSINGS) ×1 IMPLANT
BNDG ELASTIC 6INX 5YD STR LF (GAUZE/BANDAGES/DRESSINGS) ×1 IMPLANT
BNDG ELASTIC 6X10 VLCR STRL LF (GAUZE/BANDAGES/DRESSINGS) IMPLANT
BTRY SRG DRVR LF (MISCELLANEOUS) ×3
CEMENT BONE REFOBACIN R1X40 US (Cement) IMPLANT
CHLORAPREP W/TINT 26 (MISCELLANEOUS) ×2 IMPLANT
COMP FEM PS NRW 8 L (Joint) ×1 IMPLANT
COMP PATELLAR 10X35 METAL (Joint) ×1 IMPLANT
COMPONENT FEM PS NRW 8 L (Joint) IMPLANT
COMPONENT PATELLAR 10X35 METAL (Joint) IMPLANT
COVER SURGICAL LIGHT HANDLE (MISCELLANEOUS) ×1 IMPLANT
DERMABOND ADVANCED .7 DNX12 (GAUZE/BANDAGES/DRESSINGS) ×2 IMPLANT
DRAPE SHEET LG 3/4 BI-LAMINATE (DRAPES) ×3 IMPLANT
DRAPE U-SHAPE 47X51 STRL (DRAPES) ×1 IMPLANT
DRSG AQUACEL AG ADV 3.5X10 (GAUZE/BANDAGES/DRESSINGS) ×1 IMPLANT
ELECT BLADE TIP CTD 4 INCH (ELECTRODE) ×1 IMPLANT
ELECT REM PT RETURN 15FT ADLT (MISCELLANEOUS) ×1 IMPLANT
GAUZE SPONGE 4X4 12PLY STRL (GAUZE/BANDAGES/DRESSINGS) ×1 IMPLANT
GLOVE BIO SURGEON STRL SZ7 (GLOVE) ×1 IMPLANT
GLOVE BIO SURGEON STRL SZ8.5 (GLOVE) ×2 IMPLANT
GLOVE BIOGEL PI IND STRL 7.5 (GLOVE) ×1 IMPLANT
GLOVE BIOGEL PI IND STRL 8.5 (GLOVE) ×1 IMPLANT
GOWN SPEC L3 XXLG W/TWL (GOWN DISPOSABLE) ×1 IMPLANT
GOWN STRL REUS W/ TWL XL LVL3 (GOWN DISPOSABLE) ×1 IMPLANT
GOWN STRL REUS W/TWL XL LVL3 (GOWN DISPOSABLE) ×1
HANDPIECE INTERPULSE COAX TIP (DISPOSABLE) ×1
HOLDER FOLEY CATH W/STRAP (MISCELLANEOUS) ×1 IMPLANT
HOOD PEEL AWAY T7 (MISCELLANEOUS) ×3 IMPLANT
INSERT TIB PS CD/6-9 14 LT (Insert) IMPLANT
INSERT TIB PS CMT KEEL SZ DL (Insert) IMPLANT
KIT TURNOVER KIT A (KITS) IMPLANT
MARKER SKIN DUAL TIP RULER LAB (MISCELLANEOUS) ×1 IMPLANT
NDL SAFETY ECLIP 18X1.5 (MISCELLANEOUS) ×1 IMPLANT
NDL SPNL 18GX3.5 QUINCKE PK (NEEDLE) ×1 IMPLANT
NEEDLE SPNL 18GX3.5 QUINCKE PK (NEEDLE) ×1 IMPLANT
NS IRRIG 1000ML POUR BTL (IV SOLUTION) ×1 IMPLANT
PACK TOTAL KNEE CUSTOM (KITS) ×1 IMPLANT
PADDING CAST ABS COTTON 6X4 NS (CAST SUPPLIES) IMPLANT
PADDING CAST COTTON 6X4 STRL (CAST SUPPLIES) ×1 IMPLANT
PIN DRILL HDLS TROCAR 75 4PK (PIN) IMPLANT
PROTECTOR NERVE ULNAR (MISCELLANEOUS) ×1 IMPLANT
SAW OSC TIP CART 19.5X105X1.3 (SAW) ×1 IMPLANT
SCREW FEMALE HEX FIX 25X2.5 (ORTHOPEDIC DISPOSABLE SUPPLIES) IMPLANT
SEALER BIPOLAR AQUA 6.0 (INSTRUMENTS) ×1 IMPLANT
SET HNDPC FAN SPRY TIP SCT (DISPOSABLE) ×1 IMPLANT
SET PAD KNEE POSITIONER (MISCELLANEOUS) ×1 IMPLANT
SMARTMIX MINI TOWER (MISCELLANEOUS) ×1
SOLUTION PRONTOSAN WOUND 350ML (IRRIGATION / IRRIGATOR) IMPLANT
SPIKE FLUID TRANSFER (MISCELLANEOUS) ×2 IMPLANT
STEM TIB ST PERS 14+30 (Stem) IMPLANT
SUT MNCRL AB 3-0 PS2 18 (SUTURE) ×1 IMPLANT
SUT MON AB 2-0 CT1 36 (SUTURE) ×1 IMPLANT
SUT STRATAFIX PDO 1 14 VIOLET (SUTURE) ×1
SUT STRATFX PDO 1 14 VIOLET (SUTURE) ×1
SUT VIC AB 1 CTX 36 (SUTURE) ×2
SUT VIC AB 1 CTX36XBRD ANBCTR (SUTURE) ×2 IMPLANT
SUT VIC AB 2-0 CT1 27 (SUTURE) ×1
SUT VIC AB 2-0 CT1 TAPERPNT 27 (SUTURE) ×1 IMPLANT
SUTURE STRATFX PDO 1 14 VIOLET (SUTURE) ×1 IMPLANT
SYR 3ML LL SCALE MARK (SYRINGE) ×1 IMPLANT
TOWER SMARTMIX MINI (MISCELLANEOUS) IMPLANT
TRAY FOLEY MTR SLVR 16FR STAT (SET/KITS/TRAYS/PACK) IMPLANT
TUBE SUCTION HIGH CAP CLEAR NV (SUCTIONS) ×1 IMPLANT
WATER STERILE IRR 1000ML POUR (IV SOLUTION) ×2 IMPLANT
WRAP KNEE MAXI GEL POST OP (GAUZE/BANDAGES/DRESSINGS) IMPLANT

## 2022-12-17 NOTE — Discharge Instructions (Signed)
 Dr. Brian Swinteck Total Joint Specialist [facility] [address] [place], [place] [address] [phone]  TOTAL KNEE REPLACEMENT POSTOPERATIVE DIRECTIONS    Knee Rehabilitation, Guidelines Following Surgery  Results after knee surgery are often greatly improved when you follow the exercise, range of motion and muscle strengthening exercises prescribed by your doctor. Safety measures are also important to protect the knee from further injury. Any time any of these exercises cause you to have increased pain or swelling in your knee joint, decrease the amount until you are comfortable again and slowly increase them. If you have problems or questions, call your caregiver or physical therapist for advice.   WEIGHT BEARING Weight bearing as tolerated with assist device (walker, cane, etc) as directed, use it as long as suggested by your surgeon or therapist, typically at least 4-6 weeks.  HOME CARE INSTRUCTIONS  Remove items at home which could result in a fall. This includes throw rugs or furniture in walking pathways.  Continue medications as instructed at time of discharge. You may have some home medications which will be placed on hold until you complete the course of blood thinner medication.  You may start showering once you are discharged home but do not submerge the incision under water. Just pat the incision dry and apply a dry gauze dressing on daily. Walk with walker as instructed.  You may resume a sexual relationship in one month or when given the OK by your doctor.  Use walker as long as suggested by your caregivers. Avoid periods of inactivity such as sitting longer than an hour when not asleep. This helps prevent blood clots.  You may put full weight on your legs and walk as much as is comfortable.  You may return to work once you are cleared by your doctor.  Do not drive a car for 6 weeks or until released by you surgeon.  Do not drive while  taking narcotics.  Wear the elastic stockings for three weeks following surgery during the day but you may remove then at night. Make sure you keep all of your appointments after your operation with all of your doctors and caregivers. You should call the office at the above phone number and make an appointment for approximately two weeks after the date of your surgery. Do not remove your surgical dressing. The dressing is waterproof; you may take showers in 3 days, but do not take tub baths or submerge the dressing. Please pick up a stool softener and laxative for home use as long as you are requiring pain medications. ICE to the affected knee every three hours for 30 minutes at a time and then as needed for pain and swelling.  Continue to use ice on the knee for pain and swelling from surgery. You may notice swelling that will progress down to the foot and ankle.  This is normal after surgery.  Elevate the leg when you are not up walking on it.   It is important for you to complete the blood thinner medication as prescribed by your doctor. Continue to use the breathing machine which will help keep your temperature down.  It is common for your temperature to cycle up and down following surgery, especially at night when you are not up moving around and exerting yourself.  The breathing machine keeps your lungs expanded and your temperature down.  RANGE OF MOTION AND STRENGTHENING EXERCISES  Rehabilitation of the knee is important following a knee injury or an   operation. After just a few days of immobilization, the muscles of the thigh which control the knee become weakened and shrink (atrophy). Knee exercises are designed to build up the tone and strength of the thigh muscles and to improve knee motion. Often times heat used for twenty to thirty minutes before working out will loosen up your tissues and help with improving the range of motion but do not use heat for the first two weeks following surgery.  These exercises can be done on a training (exercise) mat, on the floor, on a table or on a bed. Use what ever works the best and is most comfortable for you Knee exercises include:  Leg Lifts - While your knee is still immobilized in a splint or cast, you can do straight leg raises. Lift the leg to 60 degrees, hold for 3 sec, and slowly lower the leg. Repeat 10-20 times 2-3 times daily. Perform this exercise against resistance later as your knee gets better.  Quad and Hamstring Sets - Tighten up the muscle on the front of the thigh (Quad) and hold for 5-10 sec. Repeat this 10-20 times hourly. Hamstring sets are done by pushing the foot backward against an object and holding for 5-10 sec. Repeat as with quad sets.  A rehabilitation program following serious knee injuries can speed recovery and prevent re-injury in the future due to weakened muscles. Contact your doctor or a physical therapist for more information on knee rehabilitation.   POST-OPERATIVE OPIOID TAPER INSTRUCTIONS: It is important to wean off of your opioid medication as soon as possible. If you do not need pain medication after your surgery it is ok to stop day one. Opioids include: Codeine, Hydrocodone(Norco, Vicodin), Oxycodone(Percocet, oxycontin) and hydromorphone amongst others.  Long term and even short term use of opiods can cause: Increased pain response Dependence Constipation Depression Respiratory depression And more.  Withdrawal symptoms can include Flu like symptoms Nausea, vomiting And more Techniques to manage these symptoms Hydrate well Eat regular healthy meals Stay active Use relaxation techniques(deep breathing, meditating, yoga) Do Not substitute Alcohol to help with tapering If you have been on opioids for less than two weeks and do not have pain than it is ok to stop all together.  Plan to wean off of opioids This plan should start within one week post op of your joint replacement. Maintain the same  interval or time between taking each dose and first decrease the dose.  Cut the total daily intake of opioids by one tablet each day Next start to increase the time between doses. The last dose that should be eliminated is the evening dose.    SKILLED REHAB INSTRUCTIONS: If the patient is transferred to a skilled rehab facility following release from the hospital, a list of the current medications will be sent to the facility for the patient to continue.  When discharged from the skilled rehab facility, please have the facility set up the patient's Home Health Physical Therapy prior to being released. Also, the skilled facility will be responsible for providing the patient with their medications at time of release from the facility to include their pain medication, the muscle relaxants, and their blood thinner medication. If the patient is still at the rehab facility at time of the two week follow up appointment, the skilled rehab facility will also need to assist the patient in arranging follow up appointment in our office and any transportation needs.  MAKE SURE YOU:  Understand these instructions.  Will watch   your condition.  Will get help right away if you are not doing well or get worse.    Pick up stool softner and laxative for home use following surgery while on pain medications. Do NOT remove your dressing. You may shower.  Do not take tub baths or submerge incision under water. May shower starting three days after surgery. Please use a clean towel to pat the incision dry following showers. Continue to use ice for pain and swelling after surgery. Do not use any lotions or creams on the incision until instructed by your surgeon.  

## 2022-12-17 NOTE — Anesthesia Postprocedure Evaluation (Signed)
Anesthesia Post Note  Patient: Amanda Saunders  Procedure(s) Performed: COMPUTER ASSISTED TOTAL KNEE ARTHROPLASTY (Left: Knee)     Patient location during evaluation: PACU Anesthesia Type: Regional and General Level of consciousness: awake and alert Pain management: pain level controlled Vital Signs Assessment: post-procedure vital signs reviewed and stable Respiratory status: spontaneous breathing, nonlabored ventilation, respiratory function stable and patient connected to nasal cannula oxygen Cardiovascular status: blood pressure returned to baseline and stable Postop Assessment: no apparent nausea or vomiting Anesthetic complications: no   No notable events documented.  Last Vitals:  Vitals:   12/17/22 0758 12/17/22 1102  BP:  130/69  Pulse: 73 64  Resp: 20 18  Temp:  (!) 36.4 C  SpO2: 100% 95%    Last Pain:  Vitals:   12/17/22 1102  TempSrc:   PainSc: 0-No pain    LLE Motor Response: Purposeful movement (12/17/22 1102) LLE Sensation: Decreased (12/17/22 1102) RLE Motor Response: Purposeful movement (12/17/22 1102) RLE Sensation: Decreased (12/17/22 1102) L Sensory Level: L3-Anterior knee, lower leg (12/17/22 1102) R Sensory Level: L3-Anterior knee, lower leg (12/17/22 1102)  [name]

## 2022-12-17 NOTE — Addendum Note (Signed)
Addendum  created 12/17/22 1544 by Shanon Payor, CRNA   Intraprocedure Meds edited

## 2022-12-17 NOTE — Addendum Note (Signed)
Addendum  created 12/17/22 1233 by Shanon Payor, CRNA   Intraprocedure Meds edited

## 2022-12-17 NOTE — Anesthesia Procedure Notes (Signed)
Anesthesia Regional Block: Adductor canal block   Pre-Anesthetic Checklist: , timeout performed,  Correct Patient, Correct Site, Correct Laterality,  Correct Procedure, Correct Position, site marked,  Risks and benefits discussed,  Surgical consent,  Pre-op evaluation,  At surgeon's request and post-op pain management  Laterality: Left  Prep: chloraprep       Needles:  Injection technique: Single-shot  Needle Type: Echogenic Stimulator Needle     Needle Length: 5cm  Needle Gauge: 22     Additional Needles:   Procedures:,,,, ultrasound used (permanent image in chart),,    Narrative:  Start time: 12/17/2022 7:05 AM End time: 12/17/2022 7:10 AM Injection made incrementally with aspirations every 5 mL.  Performed by: Personally  Anesthesiologist: Bethena Midget, MD  Additional Notes: Functioning IV was confirmed and monitors were applied.  A 50mm 22ga Arrow echogenic stimulator needle was used. Sterile prep and drape,hand hygiene and sterile gloves were used. Ultrasound guidance: relevant anatomy identified, needle position confirmed, local anesthetic spread visualized around nerve(s)., vascular puncture avoided.  Image printed for medical record. Negative aspiration and negative test dose prior to incremental administration of local anesthetic. The patient tolerated the procedure well.

## 2022-12-17 NOTE — Interval H&P Note (Signed)
History and Physical Interval Note:  12/17/2022 7:46 AM  Amanda Saunders  has presented today for surgery, with the diagnosis of left knee osteoarthritis.  The various methods of treatment have been discussed with the patient and family. After consideration of risks, benefits and other options for treatment, the patient has consented to  Procedure(s): COMPUTER ASSISTED TOTAL KNEE ARTHROPLASTY (Left) as a surgical intervention.  The patient's history has been reviewed, patient examined, no change in status, stable for surgery.  I have reviewed the patient's chart and labs.  Questions were answered to the patient's satisfaction.     [name] Kita Neace

## 2022-12-17 NOTE — Anesthesia Procedure Notes (Signed)
Spinal  Patient location during procedure: OR Start time: 12/17/2022 8:06 AM End time: 12/17/2022 8:14 AM Staffing Performed: resident/CRNA  Anesthesiologist: Bethena Midget, MD Resident/CRNA: Shanon Payor, CRNA Performed by: Shanon Payor, CRNA Authorized by: Bethena Midget, MD   Spinal Block Patient position: sitting Prep: ChloraPrep Patient monitoring: heart rate, cardiac monitor, continuous pulse ox and blood pressure Approach: midline Location: L3-4 Injection technique: single-shot Needle Needle type: Pencan  Needle gauge: 24 G Needle length: 10 cm Assessment Sensory level: T6

## 2022-12-17 NOTE — Plan of Care (Signed)
Plan of care reviewed and discussed. Problem: Education: Goal: Knowledge of the prescribed therapeutic regimen will improve Outcome: Progressing Goal: Individualized Educational Video(s) Outcome: Completed/Met   Problem: Activity: Goal: Ability to avoid complications of mobility impairment will improve Outcome: Progressing Goal: Range of joint motion will improve Outcome: Progressing   Problem: Clinical Measurements: Goal: Postoperative complications will be avoided or minimized Outcome: Progressing   Problem: Pain Management: Goal: Pain level will decrease with appropriate interventions Outcome: Progressing   Problem: Skin Integrity: Goal: Will show signs of wound healing Outcome: Progressing   Problem: Education: Goal: Knowledge of General Education information will improve Description: Including pain rating scale, medication(s)/side effects and non-pharmacologic comfort measures Outcome: Progressing   Problem: Health Behavior/Discharge Planning: Goal: Ability to manage health-related needs will improve Outcome: Progressing   Problem: Clinical Measurements: Goal: Ability to maintain clinical measurements within normal limits will improve Outcome: Progressing Goal: Will remain free from infection Outcome: Progressing Goal: Diagnostic test results will improve Outcome: Progressing Goal: Respiratory complications will improve Outcome: Progressing Goal: Cardiovascular complication will be avoided Outcome: Progressing   Problem: Activity: Goal: Risk for activity intolerance will decrease Outcome: Progressing   Problem: Nutrition: Goal: Adequate nutrition will be maintained Outcome: Completed/Met   Problem: Coping: Goal: Level of anxiety will decrease Outcome: Progressing   Problem: Elimination: Goal: Will not experience complications related to bowel motility Outcome: Progressing Goal: Will not experience complications related to urinary retention Outcome:  Progressing   Problem: Pain Managment: Goal: General experience of comfort will improve Outcome: Progressing   Problem: Safety: Goal: Ability to remain free from injury will improve Outcome: Progressing

## 2022-12-17 NOTE — Op Note (Signed)
OPERATIVE REPORT  SURGEON: Samson Frederic, MD   ASSISTANT: Clint Bolder, PA-C.  PREOPERATIVE DIAGNOSIS: Left knee arthritis.   POSTOPERATIVE DIAGNOSIS: Left knee arthritis.   PROCEDURE: Left total knee arthroplasty.   IMPLANTS: Zimmer Persona PS cementless femur, size 8 Narrow. Persona fixed bearing cemented tibia, size D with 5 mm medial and lateral augments, and a 14 x 30 mm stem extender. Vivacit-E polyethelyene insert, size 14 mm, CPS. TM patella, size 35 mm. Refobacin cement.  ANESTHESIA:  MAC, Regional, and Spinal  TOURNIQUET TIME: not utilized.  ESTIMATED BLOOD LOSS: -250 mL  ANTIBIOTICS: 2 g Ancef.  DRAINS: None.  COMPLICATIONS: None   CONDITION: PACU - hemodynamically stable.   BRIEF CLINICAL NOTE: Amanda Saunders is a 67 y.o. female with a long-standing history of Left knee arthritis. After failing conservative management, the patient was indicated for total knee arthroplasty. The risks, benefits, and alternatives to the procedure were explained, and the patient elected to proceed.  PROCEDURE IN DETAIL: Regional block was obtained in the pre-op holding area. Once inside the operative room, spinal anesthesia was obtained, and a foley catheter was inserted. The patient was then positioned, and the lower extremity was prepped and draped in the normal sterile surgical fashion.  A tourniquet was not applied. A time-out was called verifying side and site of surgery. The patient received IV antibiotics within 60 minutes of beginning the procedure.    An anterior approach to the knee was performed utilizing a midvastus arthrotomy. A medial release was performed and the patellar fat pad was excised. Stryker imageless navigation was used to cut the distal femur perpendicular to the mechanical axis. A freehand patellar resection was performed, and the patella was sized an prepared with a lug hole.  Nagivation was used to make a neutral proximal tibia resection, taking 8 mm of  bone from the less affected lateral side with 3 degrees of slope. The menisci were excised. A spacer block was placed, and the alignment and balance in extension were confirmed.   The distal femur was sized using the 3-degree external rotation guide referencing the posterior femoral cortex. The appropriate 4-in-1 cutting block was pinned into place. Rotation was checked using Whiteside's line, the epicondylar axis, and then confirmed with a spacer block in flexion. The remaining femoral cuts were performed, taking care to protect the MCL.  The tibia was sized and the trial tray was pinned into place. The remaining trail components were inserted. The knee was stable to varus and valgus stress through a full range of motion. The patella tracked centrally. The trial components were removed, and the proximal tibial surface was prepared. Small drill holes were made in the sclerotic subchondral bone.The cut bony surfaces were irrigated with pulse lavage. Final tibial component was cemented into place and excess cement was cleared. Femoral component was impacted into place. The trial insert was placed, and the knee was brought into extension while the cement polymerized. Once the cement was hard, the knee was tested for a final time and found to be well balanced. The trial insert was exchanged for the real polyethylene insert. The patellar component was press fit.   The wound was copiously irrigated with Prontosan followed by normal saline with pulse lavage.  Marcaine solution was injected into the periarticular soft tissue.  The wound was closed in layers using #1 Vicryl and Stratafix for the fascia, 2-0 Vicryl for the subcutaneous fat, 2-0 Monocryl for the deep dermal layer, and staples + Dermabond for the skin.  Once the glue was fully dried, an Aquacell Ag and compressive dressing were applied.  The patient was aroused from anesthesia, and the patient was transported to the recovery room in stable ondition.   Sponge, needle, and instrument counts were correct at the end of the case x2.  The patient tolerated the procedure well and there were no known complications.  The aquamantis was utilized for this case to help facilitate better hemostasis as patient was felt to be at increased risk of bleeding because of complex case requiring increased OR time and/or exposure - no tourniquet used.  A oscillating saw tip was utilized for this case to prevent damage to the soft tissue structures such as muscles, ligaments and tendons, and to ensure accurate bone cuts. This patient was at increased risk for above structures due to  minimally invasive approach.  Please note that a surgical assistant was a medical necessity for this procedure in order to perform it in a safe and expeditious manner. Surgical assistant was necessary to retract the ligaments and vital neurovascular structures to prevent injury to them and also necessary for proper positioning of the limb to allow for anatomic placement of the prosthesis.

## 2022-12-17 NOTE — Progress Notes (Signed)
Physical Therapy Evaluation Patient Details Name: Amanda Saunders MRN: [id number] DOB: 28-Jan-1956 Today's Date: 12/17/2022  History of Present Illness  Pt is 67 yo female admitted 12/17/22 for LTKA.  Pt with hx including but not limited to lupus, afib, HTN, CVA, DM, arthritis, vertigo, back surgery  Clinical Impression  Pt is s/p TKA resulting in the deficits listed below (see PT Problem List). At baseline pt is independent.  She lives in one level home with 3 steps to enter and will have family support.  Today, pt required min guard to min A for transfers and ambulated 8' with RW and min guard.  She developed some nausea and lightheadedness so was returned to supine -suspect orthostatic hypotension but BP stable once able to check.  Pt expected to progress well. Pt will benefit from acute skilled PT to increase their independence and safety with mobility to allow discharge.         Recommendations for follow up therapy are one component of a multi-disciplinary discharge planning process, led by the attending physician.  Recommendations may be updated based on patient status, additional functional criteria and insurance authorization.  Follow Up Recommendations       Assistance Recommended at Discharge Intermittent Supervision/Assistance  Patient can return home with the following  A little help with walking and/or transfers;A little help with bathing/dressing/bathroom;Assistance with cooking/housework;Help with stairs or ramp for entrance    Equipment Recommendations Rolling walker (2 wheels)  Recommendations for Other Services       Functional Status Assessment Patient has had a recent decline in their functional status and demonstrates the ability to make significant improvements in function in a reasonable and predictable amount of time.     Precautions / Restrictions Precautions Precautions: Fall;Knee Restrictions Weight Bearing Restrictions: Yes LLE Weight Bearing: Weight  bearing as tolerated      Mobility  Bed Mobility Overal bed mobility: Needs Assistance Bed Mobility: Supine to Sit, Sit to Supine     Supine to sit: Min guard, HOB elevated Sit to supine: Min assist, HOB elevated   General bed mobility comments: Min A for L LE back to bed    Transfers Overall transfer level: Needs assistance Equipment used: Rolling walker (2 wheels) Transfers: Sit to/from Stand Sit to Stand: Min assist           General transfer comment: bed elevated, cues for hand placement and L LE management    Ambulation/Gait Ambulation/Gait assistance: Min guard Gait Distance (Feet): 8 Feet Assistive device: Rolling walker (2 wheels) Gait Pattern/deviations: Step-to pattern, Decreased stride length, Decreased weight shift to left Gait velocity: decreased     General Gait Details: Slow gait with cues for sequencing.  Pt becoming nauseated after 4' so turned around, lightheaded by return to bed and returned to supine.  Symptoms improved supine.  BP supine 150/72.  Stairs            Wheelchair Mobility    Modified Rankin (Stroke Patients Only)       Balance Overall balance assessment: Needs assistance Sitting-balance support: No upper extremity supported Sitting balance-Leahy Scale: Good     Standing balance support: Bilateral upper extremity supported, Reliant on assistive device for balance Standing balance-Leahy Scale: Poor                               Pertinent Vitals/Pain Pain Assessment Pain Assessment: Faces Faces Pain Scale: Hurts a little bit Pain Location:  L knee - with movement Pain Descriptors / Indicators: Discomfort Pain Intervention(s): Limited activity within patient's tolerance, Monitored during session    Home Living Family/patient expects to be discharged to:: Private residence Living Arrangements: Spouse/significant other Available Help at Discharge: Family;Available 24 hours/day Type of Home: House Home  Access: Stairs to enter Entrance Stairs-Rails: None Entrance Stairs-Number of Steps: 3   Home Layout: One level Home Equipment: None      Prior Function Prior Level of Function : Independent/Modified Independent;Driving             Mobility Comments: could ambulate in community without AD       Hand Dominance        Extremity/Trunk Assessment   Upper Extremity Assessment Upper Extremity Assessment: Overall WFL for tasks assessed    Lower Extremity Assessment Lower Extremity Assessment: LLE deficits/detail;RLE deficits/detail RLE Deficits / Details: ROM WFL; MMT 5/5 LLE Deficits / Details: ROM knee ~5 to 60 degrees; MMT ankle 5/5, knee 2/5, hip 3/5    Cervical / Trunk Assessment Cervical / Trunk Assessment: Normal  Communication   Communication: No difficulties  Cognition Arousal/Alertness: Awake/alert Behavior During Therapy: WFL for tasks assessed/performed Overall Cognitive Status: Within Functional Limits for tasks assessed                                          General Comments      Exercises     Assessment/Plan    PT Assessment Patient needs continued PT services  PT Problem List Decreased strength;Pain;Decreased range of motion;Decreased activity tolerance;Decreased knowledge of use of DME;Decreased balance;Decreased mobility       PT Treatment Interventions DME instruction;Therapeutic exercise;Gait training;Stair training;Functional mobility training;Patient/family education;Therapeutic activities;Modalities;Balance training    PT Goals (Current goals can be found in the Care Plan section)  Acute Rehab PT Goals Patient Stated Goal: return home PT Goal Formulation: With patient/family Time For Goal Achievement: 12/31/22 Potential to Achieve Goals: Good    Frequency Min 1X/week     Co-evaluation               AM-PAC PT "6 Clicks" Mobility  Outcome Measure Help needed turning from your back to your side while in a  flat bed without using bedrails?: A Little Help needed moving from lying on your back to sitting on the side of a flat bed without using bedrails?: A Little Help needed moving to and from a bed to a chair (including a wheelchair)?: A Little Help needed standing up from a chair using your arms (e.g., wheelchair or bedside chair)?: A Little Help needed to walk in hospital room?: A Lot Help needed climbing 3-5 steps with a railing? : A Lot 6 Click Score: 16    End of Session Equipment Utilized During Treatment: Gait belt Activity Tolerance: Patient tolerated treatment well Patient left: in bed;with call bell/phone within reach;with bed alarm set;with SCD's reapplied Nurse Communication: Mobility status PT Visit Diagnosis: Other abnormalities of gait and mobility (R26.89)    Time: 1610-9604 PT Time Calculation (min) (ACUTE ONLY): 21 min   Charges:   PT Evaluation $PT Eval Low Complexity: 1 Low          Karyl Sharrar, PT Acute Rehab [facility] [phone]   Rayetta Humphrey 12/17/2022, 4:17 PM

## 2022-12-17 NOTE — Transfer of Care (Signed)
Immediate Anesthesia Transfer of Care Note  Patient: Amanda Saunders  Procedure(s) Performed: COMPUTER ASSISTED TOTAL KNEE ARTHROPLASTY (Left: Knee)  Patient Location: PACU  Anesthesia Type:Spinal  Level of Consciousness: awake, alert , and patient cooperative  Airway & Oxygen Therapy: Patient Spontanous Breathing and Patient connected to face mask oxygen  Post-op Assessment: Report given to RN, Post -op Vital signs reviewed and stable, and Patient moving all extremities X 4  Post vital signs: Reviewed and stable  Last Vitals:  Vitals Value Taken Time  BP 130/69 12/17/22 1102  Temp    Pulse 64 12/17/22 1105  Resp 24 12/17/22 1105  SpO2 100 % 12/17/22 1105  Vitals shown include unvalidated device data.  Last Pain:  Vitals:   12/17/22 0628  TempSrc:   PainSc: 7          Complications: No notable events documented.

## 2022-12-17 NOTE — Plan of Care (Signed)
  Problem: Pain Management: Goal: Pain level will decrease with appropriate interventions Outcome: Progressing   Problem: Nutritional: Goal: Maintenance of adequate nutrition will improve Outcome: Progressing   

## 2022-12-18 ENCOUNTER — Encounter (HOSPITAL_COMMUNITY): Payer: Self-pay | Admitting: Orthopedic Surgery

## 2022-12-18 DIAGNOSIS — I4819 Other persistent atrial fibrillation: Secondary | ICD-10-CM | POA: Diagnosis not present

## 2022-12-18 DIAGNOSIS — Z87891 Personal history of nicotine dependence: Secondary | ICD-10-CM | POA: Diagnosis not present

## 2022-12-18 DIAGNOSIS — Z8673 Personal history of transient ischemic attack (TIA), and cerebral infarction without residual deficits: Secondary | ICD-10-CM | POA: Diagnosis not present

## 2022-12-18 DIAGNOSIS — E119 Type 2 diabetes mellitus without complications: Secondary | ICD-10-CM | POA: Diagnosis not present

## 2022-12-18 DIAGNOSIS — M1712 Unilateral primary osteoarthritis, left knee: Secondary | ICD-10-CM | POA: Diagnosis not present

## 2022-12-18 DIAGNOSIS — Z79899 Other long term (current) drug therapy: Secondary | ICD-10-CM | POA: Diagnosis not present

## 2022-12-18 DIAGNOSIS — Z7901 Long term (current) use of anticoagulants: Secondary | ICD-10-CM | POA: Diagnosis not present

## 2022-12-18 DIAGNOSIS — Z794 Long term (current) use of insulin: Secondary | ICD-10-CM | POA: Diagnosis not present

## 2022-12-18 DIAGNOSIS — Z7984 Long term (current) use of oral hypoglycemic drugs: Secondary | ICD-10-CM | POA: Diagnosis not present

## 2022-12-18 DIAGNOSIS — I10 Essential (primary) hypertension: Secondary | ICD-10-CM | POA: Diagnosis not present

## 2022-12-18 LAB — CBC
HCT: 28.3 % — ABNORMAL LOW (ref 36.0–46.0)
Hemoglobin: 9.1 g/dL — ABNORMAL LOW (ref 12.0–15.0)
MCH: 26.5 pg (ref 26.0–34.0)
MCHC: 32.2 g/dL (ref 30.0–36.0)
MCV: 82.3 fL (ref 80.0–100.0)
Platelets: 216 10*3/uL (ref 150–400)
RBC: 3.44 MIL/uL — ABNORMAL LOW (ref 3.87–5.11)
RDW: 15.1 % (ref 11.5–15.5)
WBC: 10 10*3/uL (ref 4.0–10.5)
nRBC: 0 % (ref 0.0–0.2)

## 2022-12-18 LAB — GLUCOSE, CAPILLARY
Glucose-Capillary: 109 mg/dL — ABNORMAL HIGH (ref 70–99)
Glucose-Capillary: 102 mg/dL — ABNORMAL HIGH (ref 70–99)
Glucose-Capillary: 96 mg/dL (ref 70–99)
Glucose-Capillary: 103 mg/dL — ABNORMAL HIGH (ref 70–99)

## 2022-12-18 LAB — BASIC METABOLIC PANEL
Anion gap: 8 (ref 5–15)
BUN: 19 mg/dL (ref 8–23)
CO2: 24 mmol/L (ref 22–32)
Calcium: 8.3 mg/dL — ABNORMAL LOW (ref 8.9–10.3)
Chloride: 107 mmol/L (ref 98–111)
Creatinine, Ser: 0.93 mg/dL (ref 0.44–1.00)
GFR, Estimated: >60 mL/min (ref >60)
Glucose, Bld: 120 mg/dL — ABNORMAL HIGH (ref 70–99)
Potassium: 4.1 mmol/L (ref 3.5–5.1)
Sodium: 139 mmol/L (ref 135–145)

## 2022-12-18 MED ORDER — DOCUSATE SODIUM 100 MG PO CAPS: 100.0000 mg | ORAL_CAPSULE | Freq: Two times a day (BID) | ORAL | 0 refills | Status: AC

## 2022-12-18 MED ORDER — POLYETHYLENE GLYCOL 3350 17 G PO PACK: 17.0000 g | PACK | Freq: Every day | ORAL | 0 refills | Status: AC | PRN

## 2022-12-18 MED ORDER — SENNA 8.6 MG PO TABS: 2.0000 | ORAL_TABLET | Freq: Every day | ORAL | 0 refills | Status: AC

## 2022-12-18 MED ORDER — ONDANSETRON HCL 4 MG PO TABS: 4.0000 mg | ORAL_TABLET | Freq: Three times a day (TID) | ORAL | 0 refills | Status: DC | PRN

## 2022-12-18 MED ORDER — METHOCARBAMOL 500 MG PO TABS: 500.0000 mg | ORAL_TABLET | Freq: Four times a day (QID) | ORAL | 0 refills | Status: DC | PRN

## 2022-12-18 MED ORDER — TRAMADOL HCL 50 MG PO TABS: 50.0000 mg | ORAL_TABLET | Freq: Four times a day (QID) | ORAL | 0 refills | Status: DC | PRN

## 2022-12-18 MED ORDER — HYDROCODONE-ACETAMINOPHEN 5-325 MG PO TABS: 1.0000 | ORAL_TABLET | Freq: Four times a day (QID) | ORAL | 0 refills | Status: AC | PRN

## 2022-12-18 NOTE — Progress Notes (Signed)
Physical Therapy Treatment Patient Details Name: Amanda Saunders MRN: [id number] DOB: April 15, 1956 Today's Date: 12/18/2022   History of Present Illness Pt is 67 yo female admitted 12/17/22 for LTKA.  Pt with hx including but not limited to lupus, afib, HTN, CVA, DM, arthritis, vertigo, back surgery    PT Comments    Pt continues very motivated but limited this am by elevated pain level.   Recommendations for follow up therapy are one component of a multi-disciplinary discharge planning process, led by the attending physician.  Recommendations may be updated based on patient status, additional functional criteria and insurance authorization.  Follow Up Recommendations       Assistance Recommended at Discharge Intermittent Supervision/Assistance  Patient can return home with the following A little help with walking and/or transfers;A little help with bathing/dressing/bathroom;Assistance with cooking/housework;Help with stairs or ramp for entrance   Equipment Recommendations  Rolling walker (2 wheels)    Recommendations for Other Services       Precautions / Restrictions Precautions Precautions: Fall;Knee Restrictions Weight Bearing Restrictions: No LLE Weight Bearing: Weight bearing as tolerated     Mobility  Bed Mobility Overal bed mobility: Needs Assistance Bed Mobility: Supine to Sit     Supine to sit: Min guard, HOB elevated          Transfers Overall transfer level: Needs assistance Equipment used: Rolling walker (2 wheels) Transfers: Sit to/from Stand Sit to Stand: Min assist           General transfer comment: bed elevated, cues for hand placement and L LE management    Ambulation/Gait Ambulation/Gait assistance: Min guard Gait Distance (Feet): 12 Feet Assistive device: Rolling walker (2 wheels) Gait Pattern/deviations: Step-to pattern, Decreased stride length, Decreased weight shift to left Gait velocity: decreased     General Gait Details:  increased time with cues for sequence, posture and position from RW; difficulty achieving heel contact on L   Stairs             Wheelchair Mobility    Modified Rankin (Stroke Patients Only)       Balance Overall balance assessment: Needs assistance Sitting-balance support: No upper extremity supported Sitting balance-Leahy Scale: Good     Standing balance support: Bilateral upper extremity supported, Reliant on assistive device for balance Standing balance-Leahy Scale: Poor                              Cognition Arousal/Alertness: Awake/alert Behavior During Therapy: WFL for tasks assessed/performed Overall Cognitive Status: Within Functional Limits for tasks assessed                                          Exercises Total Joint Exercises Ankle Circles/Pumps: AROM, Both, 15 reps, Supine Quad Sets: AROM, Both, 10 reps, Supine Heel Slides: AAROM, Left, 10 reps, Supine Hip ABduction/ADduction: AAROM, Left, 10 reps, Supine Straight Leg Raises: AAROM, AROM, Left, 10 reps, Supine    General Comments        Pertinent Vitals/Pain Pain Assessment Pain Assessment: 0-10 Pain Score: 8  Pain Location: L knee - with movement Pain Descriptors / Indicators: Aching, Grimacing, Guarding, Sore Pain Intervention(s): Limited activity within patient's tolerance, Monitored during session, Premedicated before session, Ice applied (pt accepting only tyelenol and MR 2* nausea)    Home Living  Prior Function            PT Goals (current goals can now be found in the care plan section) Acute Rehab PT Goals Patient Stated Goal: return home PT Goal Formulation: With patient/family Time For Goal Achievement: 12/31/22 Potential to Achieve Goals: Good Progress towards PT goals: Progressing toward goals    Frequency    Min 1X/week      PT Plan Current plan remains appropriate    Co-evaluation               AM-PAC PT "6 Clicks" Mobility   Outcome Measure  Help needed turning from your back to your side while in a flat bed without using bedrails?: A Little Help needed moving from lying on your back to sitting on the side of a flat bed without using bedrails?: A Little Help needed moving to and from a bed to a chair (including a wheelchair)?: A Little Help needed standing up from a chair using your arms (e.g., wheelchair or bedside chair)?: A Little Help needed to walk in hospital room?: A Lot Help needed climbing 3-5 steps with a railing? : A Lot 6 Click Score: 16    End of Session Equipment Utilized During Treatment: Gait belt Activity Tolerance: Patient limited by pain Patient left: in chair;with call bell/phone within reach;with chair alarm set;with nursing/sitter in room Nurse Communication: Mobility status PT Visit Diagnosis: Other abnormalities of gait and mobility (R26.89)     Time: 0981-1914 PT Time Calculation (min) (ACUTE ONLY): 30 min  Charges:  $Gait Training: 8-22 mins $Therapeutic Exercise: 8-22 mins                     Mauro Kaufmann PT Acute Rehabilitation Services Pager [phone] Office [phone]    Giavana Rooke 12/18/2022, 12:43 PM

## 2022-12-18 NOTE — TOC Transition Note (Signed)
Transition of Care [facility]) - CM/SW Discharge Note   Patient Details  Name: Amanda Saunders MRN: [id number] Date of Birth: 05/21/1956  [facility]) CM/SW Contact:  Amada Jupiter, LCSW Phone Number: 12/18/2022, 10:29 AM   Clinical Narrative:     Met with pt and confirming she has received RW to room via [employer].  OPPT already arranged with [facility].  No further TOC needs.  Final next level of care: OP Rehab Barriers to Discharge: No Barriers Identified   Patient Goals and CMS Choice      Discharge Placement                         Discharge Plan and Services Additional resources added to the After Visit Summary for                  DME Arranged: Walker rolling DME Agency: [employer]                  Social Determinants of Health (SDOH) Interventions SDOH Screenings   Food Insecurity: No Food Insecurity (12/17/2022)  Housing: Low Risk  (12/17/2022)  Transportation Needs: No Transportation Needs (12/17/2022)  Utilities: Not At Risk (12/17/2022)  Alcohol Screen: Low Risk  (12/25/2020)  Depression (PHQ2-9): Low Risk  (05/13/2022)  Financial Resource Strain: Low Risk  (09/06/2021)  Physical Activity: Insufficiently Active (02/21/2021)  Social Connections: Moderately Isolated (12/25/2020)  Stress: No Stress Concern Present (12/25/2020)  Tobacco Use: Medium Risk (12/17/2022)     Readmission Risk Interventions     No data to display

## 2022-12-18 NOTE — Progress Notes (Signed)
    Subjective:  Patient reports pain as mild to moderate.  Denies N/V/CP/SOB/Abd pain. She reports that she had some nausea yesterday only after the oxycodone. She is tolerating the tramadol well. She denies any tingling or numbness in LE bilaterally.   Objective:   VITALS:   Vitals:   12/17/22 1845 12/17/22 2151 12/18/22 0238 12/18/22 0608  BP: (!) 147/81 128/73 136/70 119/63  Pulse: 66 72 74 65  Resp: 18 18 18 18   Temp: 97.6 F (36.4 C) 98 F (36.7 C) 97.8 F (36.6 C) 97.8 F (36.6 C)  TempSrc: Oral Oral Oral Oral  SpO2: 97% 98% 98% 97%  Weight:      Height:        Patient sitting in recliner this morning. NAD.  Neurologically intact ABD soft Neurovascular intact Sensation intact distally Intact pulses distally Dorsiflexion/Plantar flexion intact Incision: dressing C/D/I No cellulitis present Compartment soft   Lab Results  Component Value Date   WBC 10.0 12/18/2022   HGB 9.1 (L) 12/18/2022   HCT 28.3 (L) 12/18/2022   MCV 82.3 12/18/2022   PLT 216 12/18/2022   BMET    Component Value Date/Time   NA 139 12/18/2022 0351   NA 145 (H) 06/26/2022 1500   K 4.1 12/18/2022 0351   CL 107 12/18/2022 0351   CO2 24 12/18/2022 0351   GLUCOSE 120 (H) 12/18/2022 0351   BUN 19 12/18/2022 0351   BUN 17 06/26/2022 1500   CREATININE 0.93 12/18/2022 0351   CALCIUM 8.3 (L) 12/18/2022 0351   EGFR 58 (L) 06/26/2022 1500   GFRNONAA >60 12/18/2022 0351     Assessment/Plan: 1 Day Post-Op   Principal Problem:   Osteoarthritis of left knee Active Problems:   S/P total knee arthroplasty, left   WBAT with walker DVT ppx:  Eliquis , SCDs, TEDS PO pain control PT/OT: She ambulated a short distance yesterday 8 feet limited by nausea. Continue PT today.  Dispo: D/c home with OPPT once cleared with PT and she has voided.    Alain Honey Lezette Kitts,PA-C 12/18/2022, 10:07 AM  [facility]  [place] [address], [address], [place], [name] [address] Phone:  [phone] [email or web address] Facebook  [employer]

## 2022-12-18 NOTE — Progress Notes (Signed)
Physical Therapy Treatment Patient Details Name: Amanda Saunders MRN: [id number] DOB: 02/16/1956 Today's Date: 12/18/2022   History of Present Illness Pt is 67 yo female admitted 12/17/22 for LTKA.  Pt with hx including but not limited to lupus, afib, HTN, CVA, DM, arthritis, vertigo, back surgery    PT Comments    Pt continues very cooperative but progressing slowly with mobility 2* ongoing pain/nausea.   Recommendations for follow up therapy are one component of a multi-disciplinary discharge planning process, led by the attending physician.  Recommendations may be updated based on patient status, additional functional criteria and insurance authorization.  Follow Up Recommendations       Assistance Recommended at Discharge Intermittent Supervision/Assistance  Patient can return home with the following A little help with walking and/or transfers;A little help with bathing/dressing/bathroom;Assistance with cooking/housework;Help with stairs or ramp for entrance   Equipment Recommendations  Rolling walker (2 wheels)    Recommendations for Other Services       Precautions / Restrictions Precautions Precautions: Fall;Knee Restrictions Weight Bearing Restrictions: No LLE Weight Bearing: Weight bearing as tolerated     Mobility  Bed Mobility Overal bed mobility: Needs Assistance Bed Mobility: Supine to Sit, Sit to Supine     Supine to sit: Min guard Sit to supine: Min assist   General bed mobility comments: Min A for L LE back to bed    Transfers Overall transfer level: Needs assistance Equipment used: Rolling walker (2 wheels) Transfers: Sit to/from Stand Sit to Stand: Min guard           General transfer comment: bed elevated, cues for hand placement and L LE management    Ambulation/Gait Ambulation/Gait assistance: Min guard Gait Distance (Feet): 21 Feet Assistive device: Rolling walker (2 wheels) Gait Pattern/deviations: Step-to pattern, Decreased  stride length, Decreased weight shift to left Gait velocity: decreased     General Gait Details: increased time with cues for sequence, posture and position from RW; difficulty achieving heel contact on L   Stairs             Wheelchair Mobility    Modified Rankin (Stroke Patients Only)       Balance Overall balance assessment: Needs assistance Sitting-balance support: No upper extremity supported Sitting balance-Leahy Scale: Good     Standing balance support: Bilateral upper extremity supported, Reliant on assistive device for balance Standing balance-Leahy Scale: Poor                              Cognition Arousal/Alertness: Awake/alert Behavior During Therapy: WFL for tasks assessed/performed Overall Cognitive Status: Within Functional Limits for tasks assessed                                          Exercises Total Joint Exercises Ankle Circles/Pumps: AROM, Both, 15 reps, Supine Quad Sets: AROM, Both, 10 reps, Supine Heel Slides: AAROM, Left, 10 reps, Supine Hip ABduction/ADduction: AAROM, Left, 10 reps, Supine Straight Leg Raises: AAROM, AROM, Left, 10 reps, Supine    General Comments        Pertinent Vitals/Pain Pain Assessment Pain Assessment: 0-10 Pain Score: 7  Pain Location: L knee - with movement Pain Descriptors / Indicators: Aching, Grimacing, Guarding, Sore Pain Intervention(s): Monitored during session, Limited activity within patient's tolerance, Premedicated before session, Ice applied    Home Living  Prior Function            PT Goals (current goals can now be found in the care plan section) Acute Rehab PT Goals Patient Stated Goal: return home PT Goal Formulation: With patient/family Time For Goal Achievement: 12/31/22 Potential to Achieve Goals: Good Progress towards PT goals: Progressing toward goals    Frequency    7X/week      PT Plan Current plan  remains appropriate    Co-evaluation              AM-PAC PT "6 Clicks" Mobility   Outcome Measure  Help needed turning from your back to your side while in a flat bed without using bedrails?: A Little Help needed moving from lying on your back to sitting on the side of a flat bed without using bedrails?: A Little Help needed moving to and from a bed to a chair (including a wheelchair)?: A Little Help needed standing up from a chair using your arms (e.g., wheelchair or bedside chair)?: A Little Help needed to walk in hospital room?: A Lot Help needed climbing 3-5 steps with a railing? : A Lot 6 Click Score: 16    End of Session Equipment Utilized During Treatment: Gait belt Activity Tolerance: Patient limited by pain;Other (comment) (nausea) Patient left: in bed;with call bell/phone within reach;with family/visitor present;with nursing/sitter in room Nurse Communication: Mobility status PT Visit Diagnosis: Other abnormalities of gait and mobility (R26.89)     Time: 1610-9604 PT Time Calculation (min) (ACUTE ONLY): 28 min  Charges:  $Gait Training: 8-22 mins $Therapeutic Exercise: 8-22 mins                     Mauro Kaufmann PT Acute Rehabilitation Services Pager [phone] Office [phone]    Judyann Casasola 12/18/2022, 1:52 PM

## 2022-12-18 NOTE — Plan of Care (Signed)
  Problem: Education: Goal: Knowledge of the prescribed therapeutic regimen will improve Outcome: Progressing   Problem: Activity: Goal: Ability to avoid complications of mobility impairment will improve Outcome: Adequate for Discharge Goal: Range of joint motion will improve Outcome: Adequate for Discharge   Problem: Clinical Measurements: Goal: Postoperative complications will be avoided or minimized Outcome: Progressing   Problem: Pain Management: Goal: Pain level will decrease with appropriate interventions Outcome: Progressing   Problem: Skin Integrity: Goal: Will show signs of wound healing Outcome: Progressing   Problem: Education: Goal: Knowledge of General Education information will improve Description: Including pain rating scale, medication(s)/side effects and non-pharmacologic comfort measures Outcome: Progressing   Problem: Health Behavior/Discharge Planning: Goal: Ability to manage health-related needs will improve Outcome: Progressing   Problem: Clinical Measurements: Goal: Ability to maintain clinical measurements within normal limits will improve Outcome: Progressing Goal: Will remain free from infection Outcome: Progressing Goal: Diagnostic test results will improve Outcome: Progressing Goal: Respiratory complications will improve Outcome: Progressing Goal: Cardiovascular complication will be avoided Outcome: Progressing   Problem: Activity: Goal: Risk for activity intolerance will decrease Outcome: Adequate for Discharge

## 2022-12-19 ENCOUNTER — Ambulatory Visit: Payer: 59 | Admitting: Cardiology

## 2022-12-19 DIAGNOSIS — Z8673 Personal history of transient ischemic attack (TIA), and cerebral infarction without residual deficits: Secondary | ICD-10-CM | POA: Diagnosis not present

## 2022-12-19 DIAGNOSIS — M1712 Unilateral primary osteoarthritis, left knee: Secondary | ICD-10-CM | POA: Diagnosis not present

## 2022-12-19 DIAGNOSIS — Z7984 Long term (current) use of oral hypoglycemic drugs: Secondary | ICD-10-CM | POA: Diagnosis not present

## 2022-12-19 DIAGNOSIS — Z87891 Personal history of nicotine dependence: Secondary | ICD-10-CM | POA: Diagnosis not present

## 2022-12-19 DIAGNOSIS — E119 Type 2 diabetes mellitus without complications: Secondary | ICD-10-CM | POA: Diagnosis not present

## 2022-12-19 DIAGNOSIS — I10 Essential (primary) hypertension: Secondary | ICD-10-CM | POA: Diagnosis not present

## 2022-12-19 DIAGNOSIS — Z79899 Other long term (current) drug therapy: Secondary | ICD-10-CM | POA: Diagnosis not present

## 2022-12-19 DIAGNOSIS — I4819 Other persistent atrial fibrillation: Secondary | ICD-10-CM | POA: Diagnosis not present

## 2022-12-19 DIAGNOSIS — Z7901 Long term (current) use of anticoagulants: Secondary | ICD-10-CM | POA: Diagnosis not present

## 2022-12-19 DIAGNOSIS — Z794 Long term (current) use of insulin: Secondary | ICD-10-CM | POA: Diagnosis not present

## 2022-12-19 LAB — CBC
HCT: 25.9 % — ABNORMAL LOW (ref 36.0–46.0)
Hemoglobin: 8.7 g/dL — ABNORMAL LOW (ref 12.0–15.0)
MCH: 27.8 pg (ref 26.0–34.0)
MCHC: 33.6 g/dL (ref 30.0–36.0)
MCV: 82.7 fL (ref 80.0–100.0)
Platelets: 210 10*3/uL (ref 150–400)
RBC: 3.13 MIL/uL — ABNORMAL LOW (ref 3.87–5.11)
RDW: 15.4 % (ref 11.5–15.5)
WBC: 7.5 10*3/uL (ref 4.0–10.5)
nRBC: 0 % (ref 0.0–0.2)

## 2022-12-19 LAB — GLUCOSE, CAPILLARY
Glucose-Capillary: 98 mg/dL (ref 70–99)
Glucose-Capillary: 83 mg/dL (ref 70–99)
Glucose-Capillary: 104 mg/dL — ABNORMAL HIGH (ref 70–99)
Glucose-Capillary: 99 mg/dL (ref 70–99)

## 2022-12-19 MED ORDER — HYDROCODONE-ACETAMINOPHEN 5-325 MG PO TABS: 1.0000 | ORAL_TABLET | ORAL | Status: DC | PRN

## 2022-12-19 MED ORDER — TRAMADOL HCL 50 MG PO TABS
50.0000 mg | ORAL_TABLET | Freq: Four times a day (QID) | ORAL | Status: DC
  Administered 2022-12-19 – 2022-12-20 (×2): 100 mg via ORAL
  Filled 2022-12-19 (×2): qty 2
  Filled 2022-12-19: qty 1

## 2022-12-19 MED ORDER — HYDROMORPHONE HCL 1 MG/ML PO LIQD: 0.5000 mg | ORAL | Status: DC | PRN

## 2022-12-19 MED ORDER — HYDROMORPHONE HCL 2 MG PO TABS: 1.0000 mg | ORAL_TABLET | ORAL | Status: DC | PRN

## 2022-12-19 MED ORDER — PROMETHAZINE HCL 25 MG PO TABS: 12.5000 mg | ORAL_TABLET | Freq: Four times a day (QID) | ORAL | Status: DC | PRN

## 2022-12-19 MED ORDER — HYDROCODONE-ACETAMINOPHEN 7.5-325 MG PO TABS: 1.0000 | ORAL_TABLET | ORAL | Status: DC | PRN

## 2022-12-19 MED ORDER — HYDROMORPHONE HCL 2 MG PO TABS
1.0000 mg | ORAL_TABLET | ORAL | Status: DC | PRN
  Administered 2022-12-19: 2 mg via ORAL
  Filled 2022-12-19: qty 1

## 2022-12-19 NOTE — Progress Notes (Signed)
    Subjective:  Patient reports pain as moderate.  Denies N/V/CP/SOB currently. Was nauseated yesterday after pain medication. She denies any tingling or numbness in LE bilaterally.   Will adjust medication again today and she how she tolerates it.   Objective:   VITALS:   Vitals:   12/18/22 2137 12/19/22 0556 12/19/22 0626 12/19/22 1446  BP: 138/67 (!) 146/78  (!) 153/81  Pulse: 93 (!) 111 (!) 108 (!) 108  Resp: 17 17  18   Temp: 99.8 F (37.7 C) 99.8 F (37.7 C)  99.1 F (37.3 C)  TempSrc: Oral Oral  Oral  SpO2: 97% 99%  96%  Weight:      Height:        Patient lying in bed. NAD.  Neurologically intact ABD soft Neurovascular intact Sensation intact distally Intact pulses distally Dorsiflexion/Plantar flexion intact Incision: dressing C/D/I No cellulitis present Compartment soft   Lab Results  Component Value Date   WBC 7.5 12/19/2022   HGB 8.7 (L) 12/19/2022   HCT 25.9 (L) 12/19/2022   MCV 82.7 12/19/2022   PLT 210 12/19/2022   BMET    Component Value Date/Time   NA 139 12/18/2022 0351   NA 145 (H) 06/26/2022 1500   K 4.1 12/18/2022 0351   CL 107 12/18/2022 0351   CO2 24 12/18/2022 0351   GLUCOSE 120 (H) 12/18/2022 0351   BUN 19 12/18/2022 0351   BUN 17 06/26/2022 1500   CREATININE 0.93 12/18/2022 0351   CALCIUM 8.3 (L) 12/18/2022 0351   EGFR 58 (L) 06/26/2022 1500   GFRNONAA >60 12/18/2022 0351     Assessment/Plan: 2 Days Post-Op   Principal Problem:   Osteoarthritis of left knee Active Problems:   S/P total knee arthroplasty, left   WBAT with walker DVT ppx:  Eliquis , SCDs, TEDS PO pain control: Pain medication adjusted today.  PT/OT: Nauseated with PT yesterday. Adjusted medication. PT to come by again today.  Dispo: D/c home once cleared with PT and nausea controlled.     Clois Dupes, PA-C 12/19/2022, 3:25 PM   [facility]  [place] [address], [place], [name] [address] Phone:  [phone] [email or web address] Facebook  [employer]

## 2022-12-19 NOTE — Progress Notes (Signed)
Physical Therapy Treatment Patient Details Name: Amanda Saunders MRN: [id number] DOB: February 01, 1956 Today's Date: 12/19/2022   History of Present Illness Pt is 67 yo female admitted 12/17/22 for LTKA.  Pt with hx including but not limited to lupus, afib, HTN, CVA, DM, arthritis, vertigo, back surgery    PT Comments    Pt continues lethargic.  Pt requiring increased time, significant assist of two and ++cues to stand from recliner and shuffle pvt with RW to retuen to bed.  Pt struggling to follow cues, very unsteady on feet and at high risk of falling.   Recommendations for follow up therapy are one component of a multi-disciplinary discharge planning process, led by the attending physician.  Recommendations may be updated based on patient status, additional functional criteria and insurance authorization.  Follow Up Recommendations       Assistance Recommended at Discharge Intermittent Supervision/Assistance  Patient can return home with the following A little help with walking and/or transfers;A little help with bathing/dressing/bathroom;Assistance with cooking/housework;Help with stairs or ramp for entrance   Equipment Recommendations  Rolling walker (2 wheels)    Recommendations for Other Services       Precautions / Restrictions Precautions Precautions: Fall;Knee Restrictions Weight Bearing Restrictions: No LLE Weight Bearing: Weight bearing as tolerated     Mobility  Bed Mobility Overal bed mobility: Needs Assistance Bed Mobility: Sit to Supine     Supine to sit: Mod assist, Max assist Sit to supine: Mod assist, +2 for physical assistance   General bed mobility comments: Increased time with cues for sequence and use of R LE to self assist. Physical assist to manage  L LE, to control trunk and to complete rotation into bed with bed pad    Transfers Overall transfer level: Needs assistance Equipment used: Rolling walker (2 wheels) Transfers: Sit to/from Stand, Bed  to chair/wheelchair/BSC Sit to Stand: Mod assist, +2 physical assistance, +2 safety/equipment, From elevated surface   Step pivot transfers: Mod assist, +2 physical assistance, +2 safety/equipment, From elevated surface       General transfer comment: cues for LE management, use of UEs to self assist and sequencing to step pvt bed to [facility]    Ambulation/Gait               General Gait Details: gait deferred this session for safety   Stairs             Wheelchair Mobility    Modified Rankin (Stroke Patients Only)       Balance Overall balance assessment: Needs assistance Sitting-balance support: No upper extremity supported Sitting balance-Leahy Scale: Fair     Standing balance support: Bilateral upper extremity supported, Reliant on assistive device for balance Standing balance-Leahy Scale: Poor                              Cognition Arousal/Alertness: Lethargic, Suspect due to medications Behavior During Therapy: Flat affect Overall Cognitive Status: Within Functional Limits for tasks assessed                                 General Comments: Difficulty following cues and focussing on task.  Limited awareness of deficits with pt stating "I want to go home" while falling backwards on bed        Exercises Total Joint Exercises Ankle Circles/Pumps: AROM, Both, 15 reps, Supine Quad Sets: AROM, Both,  10 reps, Supine Heel Slides: AAROM, Left, 10 reps, Supine Hip ABduction/ADduction: AAROM, Left, 10 reps, Supine Straight Leg Raises: AAROM, AROM, Left, 10 reps, Supine    General Comments        Pertinent Vitals/Pain Pain Assessment Pain Assessment: 0-10 Pain Score: 6  Pain Location: L knee - with movement Pain Descriptors / Indicators: Aching, Grimacing, Guarding, Sore Pain Intervention(s): Limited activity within patient's tolerance, Monitored during session, Premedicated before session, Ice applied    Home Living                           Prior Function            PT Goals (current goals can now be found in the care plan section) Acute Rehab PT Goals Patient Stated Goal: return home PT Goal Formulation: With patient/family Time For Goal Achievement: 12/31/22 Potential to Achieve Goals: Good Progress towards PT goals: Not progressing toward goals - comment    Frequency    7X/week      PT Plan Current plan remains appropriate    Co-evaluation              AM-PAC PT "6 Clicks" Mobility   Outcome Measure  Help needed turning from your back to your side while in a flat bed without using bedrails?: A Lot Help needed moving from lying on your back to sitting on the side of a flat bed without using bedrails?: A Lot Help needed moving to and from a bed to a chair (including a wheelchair)?: A Lot Help needed standing up from a chair using your arms (e.g., wheelchair or bedside chair)?: A Lot Help needed to walk in hospital room?: Total Help needed climbing 3-5 steps with a railing? : Total 6 Click Score: 10    End of Session Equipment Utilized During Treatment: Gait belt Activity Tolerance: Patient limited by pain;Other (comment) (lethargic) Patient left: in bed;with call bell/phone within reach;with bed alarm set Nurse Communication: Mobility status PT Visit Diagnosis: Other abnormalities of gait and mobility (R26.89)     Time: 0865-7846 PT Time Calculation (min) (ACUTE ONLY): 15 min  Charges:  $Therapeutic Activity: 8-22 mins                     Mauro Kaufmann PT Acute Rehabilitation Services Pager [phone] Office [phone]    Lupita Rosales 12/19/2022, 5:11 PM

## 2022-12-19 NOTE — Progress Notes (Signed)
Physical Therapy Treatment Patient Details Name: Amanda Saunders MRN: [id number] DOB: [birth year] Today's Date: 12/19/2022   History of Present Illness Pt is 67 yo female admitted 12/17/22 for LTKA.  Pt with hx including but not limited to lupus, afib, HTN, CVA, DM, arthritis, vertigo, back surgery    PT Comments    Pt lethargic but rousable .  Pt performed therex program but requiring intermittent cues to gain participation and focus on task.  OOB deferred with pt hopefully more alert at next visit. RN aware.  Recommendations for follow up therapy are one component of a multi-disciplinary discharge planning process, led by the attending physician.  Recommendations may be updated based on patient status, additional functional criteria and insurance authorization.  Follow Up Recommendations       Assistance Recommended at Discharge Intermittent Supervision/Assistance  Patient can return home with the following A little help with walking and/or transfers;A little help with bathing/dressing/bathroom;Assistance with cooking/housework;Help with stairs or ramp for entrance   Equipment Recommendations  Rolling walker (2 wheels)    Recommendations for Other Services       Precautions / Restrictions Precautions Precautions: Fall;Knee Restrictions Weight Bearing Restrictions: No LLE Weight Bearing: Weight bearing as tolerated     Mobility  Bed Mobility                    Transfers                        Ambulation/Gait                   Stairs             Wheelchair Mobility    Modified Rankin (Stroke Patients Only)       Balance                                            Cognition Arousal/Alertness: Lethargic, Suspect due to medications Behavior During Therapy: Flat affect Overall Cognitive Status: Within Functional Limits for tasks assessed                                          Exercises  Total Joint Exercises Ankle Circles/Pumps: AROM, Both, 15 reps, Supine Quad Sets: AROM, Both, 10 reps, Supine Heel Slides: AAROM, Left, 10 reps, Supine Hip ABduction/ADduction: AAROM, Left, 10 reps, Supine Straight Leg Raises: AAROM, AROM, Left, 10 reps, Supine    General Comments        Pertinent Vitals/Pain Pain Assessment Pain Assessment: 0-10 Pain Score: 6  Pain Location: L knee - with movement Pain Descriptors / Indicators: Aching, Grimacing, Guarding, Sore Pain Intervention(s): Limited activity within patient's tolerance, Monitored during session, Premedicated before session, Ice applied    Home Living                          Prior Function            PT Goals (current goals can now be found in the care plan section) Acute Rehab PT Goals Patient Stated Goal: return home PT Goal Formulation: With patient/family Time For Goal Achievement: 12/31/22 Potential to Achieve Goals: Good Progress towards PT goals: Not progressing toward goals - comment (lethargy)  Frequency    7X/week      PT Plan Current plan remains appropriate    Co-evaluation              AM-PAC PT "6 Clicks" Mobility   Outcome Measure  Help needed turning from your back to your side while in a flat bed without using bedrails?: A Little Help needed moving from lying on your back to sitting on the side of a flat bed without using bedrails?: A Little Help needed moving to and from a bed to a chair (including a wheelchair)?: A Little Help needed standing up from a chair using your arms (e.g., wheelchair or bedside chair)?: A Little Help needed to walk in hospital room?: A Lot Help needed climbing 3-5 steps with a railing? : A Lot 6 Click Score: 16    End of Session Equipment Utilized During Treatment: Gait belt Activity Tolerance: Patient limited by pain;Other (comment) (lethargy) Patient left: in bed;with call bell/phone within reach Nurse Communication: Mobility  status PT Visit Diagnosis: Other abnormalities of gait and mobility (R26.89)     Time: 1610-9604 PT Time Calculation (min) (ACUTE ONLY): 18 min  Charges:  $Therapeutic Exercise: 8-22 mins                     Amanda Saunders PT Acute Rehabilitation Services Pager [phone] Office [phone]    Amanda Saunders 12/19/2022, 12:25 PM

## 2022-12-19 NOTE — Progress Notes (Signed)
Physical Therapy Treatment Patient Details Name: Amanda Saunders MRN: [id number] DOB: Oct 27, 1955 Today's Date: 12/19/2022   History of Present Illness Pt is 67 yo female admitted 12/17/22 for LTKA.  Pt with hx including but not limited to lupus, afib, HTN, CVA, DM, arthritis, vertigo, back surgery    PT Comments    Pt continues lethargic but more alert than this am.  Pt requesting use of bathroom and assisted to [facility] but requiring significant assist for all tasks and very unsafe.  Pt assisted up from [facility], commode pulled and replaced by recliner - ambulation deferred at this time with pt currently at high risk for falling 2* lethargy.  BP 155/82, HR 109, SaO2 95% on RA - RN aware.   Recommendations for follow up therapy are one component of a multi-disciplinary discharge planning process, led by the attending physician.  Recommendations may be updated based on patient status, additional functional criteria and insurance authorization.  Follow Up Recommendations       Assistance Recommended at Discharge Intermittent Supervision/Assistance  Patient can return home with the following A little help with walking and/or transfers;A little help with bathing/dressing/bathroom;Assistance with cooking/housework;Help with stairs or ramp for entrance   Equipment Recommendations  Rolling walker (2 wheels)    Recommendations for Other Services       Precautions / Restrictions Precautions Precautions: Fall;Knee Restrictions Weight Bearing Restrictions: No LLE Weight Bearing: Weight bearing as tolerated     Mobility  Bed Mobility Overal bed mobility: Needs Assistance Bed Mobility: Supine to Sit     Supine to sit: Mod assist, Max assist     General bed mobility comments: Increased time with cues for sequence and use of R LE to self assist. Physical assist to manage  L LE, to control trunk and to complete rotation to EOB sitting using pad    Transfers Overall transfer level: Needs  assistance Equipment used: Rolling walker (2 wheels) Transfers: Sit to/from Stand, Bed to chair/wheelchair/BSC Sit to Stand: Mod assist, +2 physical assistance, +2 safety/equipment, From elevated surface   Step pivot transfers: Min assist, Mod assist, +2 physical assistance, +2 safety/equipment, From elevated surface       General transfer comment: cues for LE management, use of UEs to self assist and sequencing to step pvt bed to [facility]    Ambulation/Gait               General Gait Details: gait deferred this session for safety   Stairs             Wheelchair Mobility    Modified Rankin (Stroke Patients Only)       Balance                                            Cognition Arousal/Alertness: Lethargic, Suspect due to medications Behavior During Therapy: Flat affect Overall Cognitive Status: Within Functional Limits for tasks assessed                                 General Comments: Difficulty following cues and focussing on task.  Limited awareness of deficits with pt stating "I want to go home" while falling backwards on bed        Exercises Total Joint Exercises Ankle Circles/Pumps: AROM, Both, 15 reps, Supine Quad Sets: AROM, Both, 10  reps, Supine Heel Slides: AAROM, Left, 10 reps, Supine Hip ABduction/ADduction: AAROM, Left, 10 reps, Supine Straight Leg Raises: AAROM, AROM, Left, 10 reps, Supine    General Comments        Pertinent Vitals/Pain Pain Assessment Pain Assessment: 0-10 Pain Score: 6  Pain Location: L knee - with movement Pain Descriptors / Indicators: Aching, Grimacing, Guarding, Sore Pain Intervention(s): Limited activity within patient's tolerance, Monitored during session, Premedicated before session, Ice applied    Home Living                          Prior Function            PT Goals (current goals can now be found in the care plan section) Acute Rehab PT Goals Patient  Stated Goal: return home PT Goal Formulation: With patient/family Time For Goal Achievement: 12/31/22 Potential to Achieve Goals: Good Progress towards PT goals: Not progressing toward goals - comment (lethargic; unsafe)    Frequency    7X/week      PT Plan Current plan remains appropriate    Co-evaluation              AM-PAC PT "6 Clicks" Mobility   Outcome Measure  Help needed turning from your back to your side while in a flat bed without using bedrails?: A Lot Help needed moving from lying on your back to sitting on the side of a flat bed without using bedrails?: A Lot Help needed moving to and from a bed to a chair (including a wheelchair)?: A Lot Help needed standing up from a chair using your arms (e.g., wheelchair or bedside chair)?: A Lot Help needed to walk in hospital room?: Total Help needed climbing 3-5 steps with a railing? : Total 6 Click Score: 10    End of Session Equipment Utilized During Treatment: Gait belt Activity Tolerance: Patient limited by pain;Other (comment) (lethargic) Patient left: in chair;with call bell/phone within reach;with family/visitor present;with bed alarm set Nurse Communication: Mobility status PT Visit Diagnosis: Other abnormalities of gait and mobility (R26.89)     Time: 1140-1205 PT Time Calculation (min) (ACUTE ONLY): 25 min  Charges:  $Therapeutic Exercise: 8-22 mins $Therapeutic Activity: 23-37 mins                     Mauro Kaufmann PT Acute Rehabilitation Services Pager [phone] Office [phone]    [facility] 12/19/2022, 12:36 PM

## 2022-12-20 DIAGNOSIS — Z87891 Personal history of nicotine dependence: Secondary | ICD-10-CM | POA: Diagnosis not present

## 2022-12-20 DIAGNOSIS — Z7901 Long term (current) use of anticoagulants: Secondary | ICD-10-CM | POA: Diagnosis not present

## 2022-12-20 DIAGNOSIS — Z7984 Long term (current) use of oral hypoglycemic drugs: Secondary | ICD-10-CM | POA: Diagnosis not present

## 2022-12-20 DIAGNOSIS — Z794 Long term (current) use of insulin: Secondary | ICD-10-CM | POA: Diagnosis not present

## 2022-12-20 DIAGNOSIS — Z79899 Other long term (current) drug therapy: Secondary | ICD-10-CM | POA: Diagnosis not present

## 2022-12-20 DIAGNOSIS — I4819 Other persistent atrial fibrillation: Secondary | ICD-10-CM | POA: Diagnosis not present

## 2022-12-20 DIAGNOSIS — I10 Essential (primary) hypertension: Secondary | ICD-10-CM | POA: Diagnosis not present

## 2022-12-20 DIAGNOSIS — Z96652 Presence of left artificial knee joint: Secondary | ICD-10-CM | POA: Diagnosis not present

## 2022-12-20 DIAGNOSIS — E119 Type 2 diabetes mellitus without complications: Secondary | ICD-10-CM | POA: Diagnosis not present

## 2022-12-20 DIAGNOSIS — M1712 Unilateral primary osteoarthritis, left knee: Secondary | ICD-10-CM | POA: Diagnosis not present

## 2022-12-20 DIAGNOSIS — Z8673 Personal history of transient ischemic attack (TIA), and cerebral infarction without residual deficits: Secondary | ICD-10-CM | POA: Diagnosis not present

## 2022-12-20 LAB — GLUCOSE, CAPILLARY
Glucose-Capillary: 103 mg/dL — ABNORMAL HIGH (ref 70–99)
Glucose-Capillary: 137 mg/dL — ABNORMAL HIGH (ref 70–99)

## 2022-12-20 NOTE — Plan of Care (Signed)
  Problem: Education: Goal: Knowledge of the prescribed therapeutic regimen will improve Outcome: Progressing   Problem: Activity: Goal: Ability to avoid complications of mobility impairment will improve Outcome: Progressing Goal: Range of joint motion will improve Outcome: Progressing   Problem: Clinical Measurements: Goal: Postoperative complications will be avoided or minimized Outcome: Progressing   Problem: Pain Management: Goal: Pain level will decrease with appropriate interventions Outcome: Progressing   Problem: Skin Integrity: Goal: Will show signs of wound healing Outcome: Progressing   Problem: Education: Goal: Knowledge of General Education information will improve Description: Including pain rating scale, medication(s)/side effects and non-pharmacologic comfort measures Outcome: Progressing   Problem: Health Behavior/Discharge Planning: Goal: Ability to manage health-related needs will improve Outcome: Progressing   Problem: Clinical Measurements: Goal: Ability to maintain clinical measurements within normal limits will improve Outcome: Progressing Goal: Will remain free from infection Outcome: Progressing Goal: Diagnostic test results will improve Outcome: Progressing Goal: Respiratory complications will improve Outcome: Progressing Goal: Cardiovascular complication will be avoided Outcome: Progressing   Problem: Activity: Goal: Risk for activity intolerance will decrease Outcome: Progressing   Problem: Coping: Goal: Level of anxiety will decrease Outcome: Progressing   Problem: Elimination: Goal: Will not experience complications related to bowel motility Outcome: Progressing   Problem: Pain Managment: Goal: General experience of comfort will improve Outcome: Progressing

## 2022-12-20 NOTE — Discharge Summary (Signed)
Physician Discharge Summary   Patient ID: Amanda Saunders MRN: [id number] DOB/AGE: Sep 16, 1955 67 y.o.  Admit date: 12/17/2022 Discharge date: 12/20/22  Primary Diagnosis: left knee primary osteoarthritis  Admission Diagnoses:  Past Medical History:  Diagnosis Date   Aortic atherosclerosis (HCC)    Arthritis    Cardiomyopathy (HCC)    Diabetes mellitus without complication (HCC)    Dyspnea    Dysrhythmia    Headache    Heart murmur    at birth   Hypertension    Internal hemorrhoids    Lupus (HCC)    PUD (peptic ulcer disease)    Stroke (HCC) 08/13/2018   Vertigo    Discharge Diagnoses:   Principal Problem:   Osteoarthritis of left knee Active Problems:   S/P total knee arthroplasty, left  Estimated body mass index is 30.46 kg/m as calculated from the following:   Height as of this encounter: 5\' 3"  (1.6 m).   Weight as of this encounter: 78 kg.  Procedure:  Procedure(s) (LRB): COMPUTER ASSISTED TOTAL KNEE ARTHROPLASTY (Left)   Consults: None  HPI: see H&P Laboratory Data: Admission on 12/17/2022  Component Date Value Ref Range Status   Glucose-Capillary 12/17/2022 95  70 - 99 mg/dL Final   Glucose reference range applies only to samples taken after fasting for at least 8 hours.   Glucose-Capillary 12/17/2022 86  70 - 99 mg/dL Final   Glucose reference range applies only to samples taken after fasting for at least 8 hours.   WBC 12/18/2022 10.0  4.0 - 10.5 K/uL Final   RBC 12/18/2022 3.44 (L)  3.87 - 5.11 MIL/uL Final   Hemoglobin 12/18/2022 9.1 (L)  12.0 - 15.0 g/dL Final   HCT 78/29/5621 28.3 (L)  36.0 - 46.0 % Final   MCV 12/18/2022 82.3  80.0 - 100.0 fL Final   MCH 12/18/2022 26.5  26.0 - 34.0 pg Final   MCHC 12/18/2022 32.2  30.0 - 36.0 g/dL Final   RDW 30/86/5784 15.1  11.5 - 15.5 % Final   Platelets 12/18/2022 216  150 - 400 K/uL Final   nRBC 12/18/2022 0.0  0.0 - 0.2 % Final   Performed at [facility], [address]  [place], [name] [address]   Sodium 12/18/2022 139  135 - 145 mmol/L Final   Potassium 12/18/2022 4.1  3.5 - 5.1 mmol/L Final   Chloride 12/18/2022 107  98 - 111 mmol/L Final   CO2 12/18/2022 24  22 - 32 mmol/L Final   Glucose, Bld 12/18/2022 120 (H)  70 - 99 mg/dL Final   Glucose reference range applies only to samples taken after fasting for at least 8 hours.   BUN 12/18/2022 19  8 - 23 mg/dL Final   Creatinine, Ser 12/18/2022 0.93  0.44 - 1.00 mg/dL Final   Calcium 52/84/1324 8.3 (L)  8.9 - 10.3 mg/dL Final   GFR, Estimated 12/18/2022 >60  >60 mL/min Final   Comment: (NOTE) Calculated using the CKD-EPI Creatinine Equation (2021)    Anion gap 12/18/2022 8  5 - 15 Final   Performed at [facility], [address] [place], [name] [address]   Glucose-Capillary 12/17/2022 176 (H)  70 - 99 mg/dL Final   Glucose reference range applies only to samples taken after fasting for at least 8 hours.   Glucose-Capillary 12/17/2022 116 (H)  70 - 99 mg/dL Final   Glucose reference range applies only to samples taken after fasting for at least 8  hours.   Glucose-Capillary 12/18/2022 109 (H)  70 - 99 mg/dL Final   Glucose reference range applies only to samples taken after fasting for at least 8 hours.   Glucose-Capillary 12/18/2022 102 (H)  70 - 99 mg/dL Final   Glucose reference range applies only to samples taken after fasting for at least 8 hours.   Glucose-Capillary 12/18/2022 96  70 - 99 mg/dL Final   Glucose reference range applies only to samples taken after fasting for at least 8 hours.   WBC 12/19/2022 7.5  4.0 - 10.5 K/uL Final   RBC 12/19/2022 3.13 (L)  3.87 - 5.11 MIL/uL Final   Hemoglobin 12/19/2022 8.7 (L)  12.0 - 15.0 g/dL Final   HCT 40/98/1191 25.9 (L)  36.0 - 46.0 % Final   MCV 12/19/2022 82.7  80.0 - 100.0 fL Final   MCH 12/19/2022 27.8  26.0 - 34.0 pg Final   MCHC 12/19/2022 33.6  30.0 - 36.0 g/dL Final   RDW 47/82/9562 15.4  11.5 - 15.5 % Final   Platelets  12/19/2022 210  150 - 400 K/uL Final   nRBC 12/19/2022 0.0  0.0 - 0.2 % Final   Performed at [facility], [address] [place], [name] [address]   Glucose-Capillary 12/18/2022 103 (H)  70 - 99 mg/dL Final   Glucose reference range applies only to samples taken after fasting for at least 8 hours.   Glucose-Capillary 12/19/2022 98  70 - 99 mg/dL Final   Glucose reference range applies only to samples taken after fasting for at least 8 hours.   Glucose-Capillary 12/19/2022 83  70 - 99 mg/dL Final   Glucose reference range applies only to samples taken after fasting for at least 8 hours.   Glucose-Capillary 12/19/2022 104 (H)  70 - 99 mg/dL Final   Glucose reference range applies only to samples taken after fasting for at least 8 hours.   Glucose-Capillary 12/19/2022 99  70 - 99 mg/dL Final   Glucose reference range applies only to samples taken after fasting for at least 8 hours.   Glucose-Capillary 12/20/2022 103 (H)  70 - 99 mg/dL Final   Glucose reference range applies only to samples taken after fasting for at least 8 hours.  Hospital Outpatient Visit on 12/04/2022  Component Date Value Ref Range Status   Sodium 12/04/2022 139  135 - 145 mmol/L Final   Potassium 12/04/2022 3.9  3.5 - 5.1 mmol/L Final   Chloride 12/04/2022 103  98 - 111 mmol/L Final   CO2 12/04/2022 26  22 - 32 mmol/L Final   Glucose, Bld 12/04/2022 101 (H)  70 - 99 mg/dL Final   Glucose reference range applies only to samples taken after fasting for at least 8 hours.   BUN 12/04/2022 18  8 - 23 mg/dL Final   Creatinine, Ser 12/04/2022 0.83  0.44 - 1.00 mg/dL Final   Calcium 57/84/6962 8.3 (L)  8.9 - 10.3 mg/dL Final   GFR, Estimated 12/04/2022 >60  >60 mL/min Final   Comment: (NOTE) Calculated using the CKD-EPI Creatinine Equation (2021)    Anion gap 12/04/2022 10  5 - 15 Final   Performed at [facility], [address] [place], [name] [address]   WBC 12/04/2022 5.5  4.0  - 10.5 K/uL Final   RBC 12/04/2022 4.20  3.87 - 5.11 MIL/uL Final   Hemoglobin 12/04/2022 11.0 (L)  12.0 - 15.0 g/dL Final   HCT 13/24/4010 35.0 (L)  36.0 -  46.0 % Final   MCV 12/04/2022 83.3  80.0 - 100.0 fL Final   MCH 12/04/2022 26.2  26.0 - 34.0 pg Final   MCHC 12/04/2022 31.4  30.0 - 36.0 g/dL Final   RDW 40/98/1191 15.0  11.5 - 15.5 % Final   Platelets 12/04/2022 204  150 - 400 K/uL Final   nRBC 12/04/2022 0.0  0.0 - 0.2 % Final   Performed at [facility], [address] [place], [name] [address]   MRSA, PCR 12/04/2022 NEGATIVE  NEGATIVE Final   Staphylococcus aureus 12/04/2022 NEGATIVE  NEGATIVE Final   Comment: (NOTE) The Xpert SA Assay (FDA approved for NASAL specimens in patients 10 years of age and older), is one component of a comprehensive surveillance program. It is not intended to diagnose infection nor to guide or monitor treatment. Performed at [facility], [address] [place], [name] [address]    Hgb A1c MFr Bld 12/04/2022 5.9 (H)  4.8 - 5.6 % Final   Comment: (NOTE) Pre diabetes:          5.7%-6.4%  Diabetes:              >6.4%  Glycemic control for   <7.0% adults with diabetes    Mean Plasma Glucose 12/04/2022 122.63  mg/dL Final   Performed at [facility], [address] [place], [name] [address]   Glucose-Capillary 12/04/2022 105 (H)  70 - 99 mg/dL Final   Glucose reference range applies only to samples taken after fasting for at least 8 hours.     X-Rays:DG Knee Left Port  Result Date: 12/17/2022 CLINICAL DATA:  Status post knee replacement. EXAM: PORTABLE LEFT KNEE - 1-2 VIEW COMPARISON:  None Available. FINDINGS: Two views of the left knee demonstrate a total knee replacement. The knee is located without a periprosthetic fracture. Anterior surgical skin staples. Lucency in the soft tissues compatible with recent surgery. IMPRESSION: Left knee replacement without complicating features. Electronically  Signed   By: Richarda Overlie M.D.   On: 12/17/2022 12:33    EKG: Orders placed or performed in visit on 07/28/22   EKG 12-Lead     Hospital Course: PHYLICIA MERCY is a 66 y.o. who was admitted to [facility]. They were brought to the operating room on 12/17/2022 and underwent Procedure(s): COMPUTER ASSISTED TOTAL KNEE ARTHROPLASTY.  Patient tolerated the procedure well and was later transferred to the recovery room and then to the orthopaedic floor for postoperative care.  They were given PO and IV analgesics for pain control following their surgery.  They were given 24 hours of postoperative antibiotics of  Anti-infectives (From admission, onward)    Start     Dose/Rate Route Frequency Ordered Stop   12/17/22 1430  ceFAZolin (ANCEF) IVPB 2g/100 mL premix        2 g 200 mL/hr over 30 Minutes Intravenous Every 6 hours 12/17/22 1337 12/17/22 2058   12/17/22 1015  vancomycin (VANCOCIN) powder  Status:  Discontinued          As needed 12/17/22 1026 12/17/22 1331   12/17/22 0600  ceFAZolin (ANCEF) IVPB 2g/100 mL premix        2 g 200 mL/hr over 30 Minutes Intravenous On call to O.R. 12/17/22 [id number] 12/17/22 0818   12/17/22 0600  ceFAZolin (ANCEF) IVPB 2g/100 mL premix  Status:  Discontinued        2 g 200 mL/hr over 30 Minutes Intravenous On call to  O.R. 12/17/22 [id number] 12/17/22 1331      and started on DVT prophylaxis in the form of TED hose and Eliquis, SCDs .   PT and OT were ordered for total joint protocol.  Discharge planning consulted to help with postop disposition and equipment needs.  Patient had a difficult night on the evening of surgery.  They started to get up OOB with therapy on day one. Continued to work with therapy into day two.  By day three, the patient had progressed with therapy and meeting their goals.  Incision was healing well.  Patient was seen in rounds and was ready to go home.   Diet: Diabetic diet Activity:WBAT Follow-up:in 10-14 days Disposition -  Home Discharged Condition: good   Discharge Instructions     Call MD / Call 911   Complete by: As directed    If you experience chest pain or shortness of breath, CALL 911 and be transported to the hospital emergency room.  If you develope a fever above 101 F, pus (white drainage) or increased drainage or redness at the wound, or calf pain, call your surgeon's office.   Call MD / Call 911   Complete by: As directed    If you experience chest pain or shortness of breath, CALL 911 and be transported to the hospital emergency room.  If you develope a fever above 101 F, pus (white drainage) or increased drainage or redness at the wound, or calf pain, call your surgeon's office.   Change dressing   Complete by: As directed    Do not remove your dressing.   Constipation Prevention   Complete by: As directed    Drink plenty of fluids.  Prune juice may be helpful.  You may use a stool softener, such as Colace (over the counter) 100 mg twice a day.  Use MiraLax (over the counter) for constipation as needed.   Constipation Prevention   Complete by: As directed    Drink plenty of fluids.  Prune juice may be helpful.  You may use a stool softener, such as Colace (over the counter) 100 mg twice a day.  Use MiraLax (over the counter) for constipation as needed.   Diet - low sodium heart healthy   Complete by: As directed    Discharge instructions   Complete by: As directed    Elevate toes above nose. Use cryotherapy as needed for pain and swelling.   Do not put a pillow under the knee. Place it under the heel.   Complete by: As directed    Driving restrictions   Complete by: As directed    No driving for 6 weeks   Increase activity slowly as tolerated   Complete by: As directed    Increase activity slowly as tolerated   Complete by: As directed    Lifting restrictions   Complete by: As directed    No lifting for 6 weeks   Post-operative opioid taper instructions:   Complete by: As directed     POST-OPERATIVE OPIOID TAPER INSTRUCTIONS: It is important to wean off of your opioid medication as soon as possible. If you do not need pain medication after your surgery it is ok to stop day one. Opioids include: Codeine, Hydrocodone(Norco, Vicodin), Oxycodone(Percocet, oxycontin) and hydromorphone amongst others.  Long term and even short term use of opiods can cause: Increased pain response Dependence Constipation Depression Respiratory depression And more.  Withdrawal symptoms can include Flu like symptoms Nausea, vomiting And more Techniques to  manage these symptoms Hydrate well Eat regular healthy meals Stay active Use relaxation techniques(deep breathing, meditating, yoga) Do Not substitute Alcohol to help with tapering If you have been on opioids for less than two weeks and do not have pain than it is ok to stop all together.  Plan to wean off of opioids This plan should start within one week post op of your joint replacement. Maintain the same interval or time between taking each dose and first decrease the dose.  Cut the total daily intake of opioids by one tablet each day Next start to increase the time between doses. The last dose that should be eliminated is the evening dose.      Post-operative opioid taper instructions:   Complete by: As directed    POST-OPERATIVE OPIOID TAPER INSTRUCTIONS: It is important to wean off of your opioid medication as soon as possible. If you do not need pain medication after your surgery it is ok to stop day one. Opioids include: Codeine, Hydrocodone(Norco, Vicodin), Oxycodone(Percocet, oxycontin) and hydromorphone amongst others.  Long term and even short term use of opiods can cause: Increased pain response Dependence Constipation Depression Respiratory depression And more.  Withdrawal symptoms can include Flu like symptoms Nausea, vomiting And more Techniques to manage these symptoms Hydrate well Eat regular healthy  meals Stay active Use relaxation techniques(deep breathing, meditating, yoga) Do Not substitute Alcohol to help with tapering If you have been on opioids for less than two weeks and do not have pain than it is ok to stop all together.  Plan to wean off of opioids This plan should start within one week post op of your joint replacement. Maintain the same interval or time between taking each dose and first decrease the dose.  Cut the total daily intake of opioids by one tablet each day Next start to increase the time between doses. The last dose that should be eliminated is the evening dose.      TED hose   Complete by: As directed    Use stockings (TED hose) for 2 weeks on both leg(s).  You may remove them at night for sleeping.   Weight bearing as tolerated   Complete by: As directed       Allergies as of 12/20/2022       Reactions   Pholcodine Nausea Only   Codeine Nausea And Vomiting   ALL FORMS        Medication List     STOP taking these medications    Benlysta 400 MG Solr injection Generic drug: belimumab   colchicine 0.6 MG tablet   diclofenac Sodium 1 % Gel Commonly known as: Voltaren   ondansetron 8 MG disintegrating tablet Commonly known as: ZOFRAN-ODT       TAKE these medications    acetaminophen 650 MG CR tablet Commonly known as: TYLENOL Take 1,300 mg by mouth every 8 (eight) hours as needed for pain.   CINNAMON PO Take 1 capsule by mouth daily.   docusate sodium 100 MG capsule Commonly known as: Colace Take 1 capsule (100 mg total) by mouth 2 (two) times daily.   Eliquis 5 MG Tabs tablet Generic drug: apixaban TAKE ONE TABLET BY MOUTH EVERY MORNING and TAKE ONE TABLET BY MOUTH EVERYDAY AT BEDTIME   furosemide 20 MG tablet Commonly known as: LASIX Take 1 tablet once daily and may take an extra tablet as needed for shortness of breath or weight gain   gabapentin 800 MG tablet Commonly known as:  NEURONTIN TAKE ONE TABLET BY MOUTH every  morning and TAKE ONE TABLET BY MOUTH everyday AT bedtime   GINGER PO Take by mouth.   HYDROcodone-acetaminophen 5-325 MG tablet Commonly known as: NORCO/VICODIN Take 1 tablet by mouth every 6 (six) hours as needed for up to 7 days for severe pain.   hydroxychloroquine 200 MG tablet Commonly known as: PLAQUENIL Take 200 mg by mouth 2 (two) times daily.   losartan 50 MG tablet Commonly known as: COZAAR Take 1 tablet (50 mg total) by mouth every morning.   Magnesium 250 MG Tabs Take 250 mg by mouth daily.   metFORMIN 500 MG tablet Commonly known as: GLUCOPHAGE Take 1 tablet (500 mg total) by mouth at bedtime.   methocarbamol 500 MG tablet Commonly known as: ROBAXIN Take 1 tablet (500 mg total) by mouth every 6 (six) hours as needed for muscle spasms. What changed: when to take this   metoprolol succinate 50 MG 24 hr tablet Commonly known as: TOPROL-XL Take 1 tablet (50 mg total) by mouth daily. Take with or immediately following a meal.   omeprazole 40 MG capsule Commonly known as: PRILOSEC TAKE ONE CAPSULE BY MOUTH ONCE DAILY   ondansetron 4 MG tablet Commonly known as: Zofran Take 1 tablet (4 mg total) by mouth every 8 (eight) hours as needed for nausea or vomiting.   polyethylene glycol 17 g packet Commonly known as: MiraLax Take 17 g by mouth daily as needed for mild constipation or moderate constipation.   rosuvastatin 20 MG tablet Commonly known as: CRESTOR TAKE ONE TABLET BY MOUTH EVERYDAY AT BEDTIME   Semaglutide (2 MG/DOSE) 8 MG/3ML Sopn Inject 2 mg as directed once a week.   senna 8.6 MG Tabs tablet Commonly known as: SENOKOT Take 2 tablets (17.2 mg total) by mouth at bedtime for 15 days.   sucralfate 1 g tablet Commonly known as: CARAFATE Take 1 tablet (1 g total) by mouth 4 (four) times daily -  with meals and at bedtime. As needed   traMADol 50 MG tablet Commonly known as: Ultram Take 1 tablet (50 mg total) by mouth every 6 (six) hours as needed  for moderate pain. What changed:  when to take this reasons to take this   TRIPLE OMEGA COMPLEX PO Take 1 capsule by mouth daily.   TURMERIC PO Take 1 capsule by mouth daily.   Vitamin A 2400 MCG (8000 UT) Caps Take 2,400 Units by mouth daily.   Vitamin D-3 125 MCG (5000 UT) Tabs Take 5,000 Units by mouth daily.               Discharge Care Instructions  (From admission, onward)           Start     Ordered   12/18/22 0000  Weight bearing as tolerated        12/18/22 1015   12/18/22 0000  Change dressing       Comments: Do not remove your dressing.   12/18/22 1015            Follow-up Information     Clois Dupes, PA-C. Schedule an appointment as soon as possible for a visit in 2 week(s).   Specialty: Orthopedic Surgery Why: For suture removal, For wound re-check Contact information: [address] [name] [address] [phone]                 Signed: Andrez Grime, PA-C Orthopaedic Surgery 12/20/2022, 9:19 AM

## 2022-12-20 NOTE — TOC Progression Note (Addendum)
Transition of Care [facility]) - Progression Note    Patient Details  Name: MEENA MAFI MRN: [id number] Date of Birth: 1956-01-25  Transition of Care [facility] CM/SW Contact  Georgie Chard, LCSW Phone Number: 12/20/2022, 2:19 PM  Clinical Narrative:    CSW has ordered a BSC through [employer]. Patient has agreed to pay Out of pocket for DME. At this time there are no further TOC needs.     Barriers to Discharge: No Barriers Identified  Expected Discharge Plan and Services         Expected Discharge Date: 12/20/22               DME Arranged: Dan Humphreys rolling DME Agency: [employer]                   Social Determinants of Health (SDOH) Interventions SDOH Screenings   Food Insecurity: No Food Insecurity (12/17/2022)  Housing: Low Risk  (12/17/2022)  Transportation Needs: No Transportation Needs (12/17/2022)  Utilities: Not At Risk (12/17/2022)  Alcohol Screen: Low Risk  (12/25/2020)  Depression (PHQ2-9): Low Risk  (05/13/2022)  Financial Resource Strain: Low Risk  (09/06/2021)  Physical Activity: Insufficiently Active (02/21/2021)  Social Connections: Moderately Isolated (12/25/2020)  Stress: No Stress Concern Present (12/25/2020)  Tobacco Use: Medium Risk (12/18/2022)    Readmission Risk Interventions     No data to display

## 2022-12-20 NOTE — Progress Notes (Signed)
Physical Therapy Treatment Patient Details Name: Amanda Saunders MRN: [id number] DOB: 1955-10-24 Today's Date: 12/20/2022   History of Present Illness Pt is 67 yo female admitted 12/17/22 for LTKA.  Pt with hx including but not limited to lupus, afib, HTN, CVA, DM, arthritis, vertigo, back surgery    PT Comments    Pt very cooperative and much more alert and able to participate with PT this am but fatigues easily with activity.  This am, pt performed HEP with assist and up to ambulate ltd distance in room.  Pt with marked improvement in stability and decreased level of assist for all tasks.   Recommendations for follow up therapy are one component of a multi-disciplinary discharge planning process, led by the attending physician.  Recommendations may be updated based on patient status, additional functional criteria and insurance authorization.  Follow Up Recommendations       Assistance Recommended at Discharge Intermittent Supervision/Assistance  Patient can return home with the following A little help with walking and/or transfers;A little help with bathing/dressing/bathroom;Assistance with cooking/housework;Help with stairs or ramp for entrance   Equipment Recommendations  Rolling walker (2 wheels)    Recommendations for Other Services       Precautions / Restrictions Precautions Precautions: Fall;Knee Restrictions Weight Bearing Restrictions: No LLE Weight Bearing: Weight bearing as tolerated     Mobility  Bed Mobility Overal bed mobility: Needs Assistance Bed Mobility: Supine to Sit     Supine to sit: Min guard     General bed mobility comments: Increased time with min guard for safety only    Transfers Overall transfer level: Needs assistance Equipment used: Rolling walker (2 wheels) Transfers: Sit to/from Stand Sit to Stand: Min assist           General transfer comment: cues for LE management, use of UEs to self assist     Ambulation/Gait Ambulation/Gait assistance: Min assist Gait Distance (Feet): 12 Feet Assistive device: Rolling walker (2 wheels) Gait Pattern/deviations: Step-to pattern, Decreased stride length, Decreased weight shift to left Gait velocity: decreased     General Gait Details: cues for sequence, posture; position from RW; and increased heel contact and knee ext on L.  Distance ltd by fatigue   Stairs             Wheelchair Mobility    Modified Rankin (Stroke Patients Only)       Balance Overall balance assessment: Needs assistance Sitting-balance support: No upper extremity supported Sitting balance-Leahy Scale: Fair     Standing balance support: Reliant on assistive device for balance, Single extremity supported Standing balance-Leahy Scale: Poor                              Cognition Arousal/Alertness: Awake/alert Behavior During Therapy: WFL for tasks assessed/performed Overall Cognitive Status: Within Functional Limits for tasks assessed                                 General Comments: Difficulty following cues and focussing on task.  Limited awareness of deficits with pt stating "I want to go home" while falling backwards on bed        Exercises Total Joint Exercises Ankle Circles/Pumps: AROM, Both, 15 reps, Supine Quad Sets: AROM, Both, 10 reps, Supine Heel Slides: AAROM, Left, Supine, 15 reps Hip ABduction/ADduction: AAROM, Left, Supine, 15 reps Straight Leg Raises: AAROM, AROM, Left, Supine,  20 reps Goniometric ROM: AAROM at L knee -8 - 40    General Comments        Pertinent Vitals/Pain Pain Assessment Pain Assessment: 0-10 Pain Score: 7  Pain Location: L knee - with movement Pain Descriptors / Indicators: Aching, Grimacing, Guarding, Sore Pain Intervention(s): Limited activity within patient's tolerance, Monitored during session, Premedicated before session, Ice applied (Tylenol only)    Home Living                           Prior Function            PT Goals (current goals can now be found in the care plan section) Acute Rehab PT Goals Patient Stated Goal: return home PT Goal Formulation: With patient/family Time For Goal Achievement: 12/31/22 Potential to Achieve Goals: Good Progress towards PT goals: Progressing toward goals    Frequency    7X/week      PT Plan Current plan remains appropriate    Co-evaluation              AM-PAC PT "6 Clicks" Mobility   Outcome Measure  Help needed turning from your back to your side while in a flat bed without using bedrails?: A Little Help needed moving from lying on your back to sitting on the side of a flat bed without using bedrails?: A Little Help needed moving to and from a bed to a chair (including a wheelchair)?: A Little Help needed standing up from a chair using your arms (e.g., wheelchair or bedside chair)?: A Little Help needed to walk in hospital room?: A Lot Help needed climbing 3-5 steps with a railing? : A Lot 6 Click Score: 16    End of Session Equipment Utilized During Treatment: Gait belt Activity Tolerance: Patient limited by pain;Patient limited by fatigue Patient left: with call bell/phone within reach;in chair;with chair alarm set Nurse Communication: Mobility status PT Visit Diagnosis: Other abnormalities of gait and mobility (R26.89)     Time: 7829-5621 PT Time Calculation (min) (ACUTE ONLY): 26 min  Charges:  $Gait Training: 8-22 mins $Therapeutic Exercise: 8-22 mins                     Mauro Kaufmann PT Acute Rehabilitation Services Pager [phone] Office [phone]    Amanda Saunders 12/20/2022, 11:57 AM

## 2022-12-20 NOTE — Plan of Care (Signed)
Pt ready to DC home when husband arrives. 

## 2022-12-20 NOTE — Progress Notes (Signed)
Subjective: 3 Days Post-Op Procedure(s) (LRB): COMPUTER ASSISTED TOTAL KNEE ARTHROPLASTY (Left) Patient reports pain as mild.   Pain doing better today. No other c/o. Feels ready to go home.  Objective: Vital signs in last 24 hours: Temp:  [99.1 F (37.3 C)-100.7 F (38.2 C)] 100.5 F (38.1 C) (06/01 0618) Pulse Rate:  [101-108] 107 (06/01 0618) Resp:  [17-19] 17 (06/01 0618) BP: (121-153)/(65-81) 133/65 (06/01 0618) SpO2:  [94 %-98 %] 96 % (06/01 0618)  Intake/Output from previous day: 05/31 0701 - 06/01 0700 In: -  Out: 1400 [Urine:1400] Intake/Output this shift: No intake/output data recorded.  Recent Labs    12/18/22 0351 12/19/22 0355  HGB 9.1* 8.7*   Recent Labs    12/18/22 0351 12/19/22 0355  WBC 10.0 7.5  RBC 3.44* 3.13*  HCT 28.3* 25.9*  PLT 216 210   Recent Labs    12/18/22 0351  NA 139  K 4.1  CL 107  CO2 24  BUN 19  CREATININE 0.93  GLUCOSE 120*  CALCIUM 8.3*   No results for input(s): "LABPT", "INR" in the last 72 hours.  Neurologically intact ABD soft Neurovascular intact Sensation intact distally Intact pulses distally Dorsiflexion/Plantar flexion intact Incision: dressing C/D/I and no drainage No cellulitis present Compartment soft No sign of DVT   Assessment/Plan: 3 Days Post-Op Procedure(s) (LRB): COMPUTER ASSISTED TOTAL KNEE ARTHROPLASTY (Left) Advance diet Up with therapy D/C IV fluids Eliquis for DVT ppx D/C to home today Discussed D/C instructions  Amanda Saunders 12/20/2022, 9:16 AM

## 2022-12-20 NOTE — Progress Notes (Signed)
Physical Therapy Treatment Patient Details Name: Amanda Saunders MRN: [id number] DOB: [birth year] Today's Date: 12/20/2022   History of Present Illness Pt is 67 yo female admitted 12/17/22 for LTKA.  Pt with hx including but not limited to lupus, afib, HTN, CVA, DM, arthritis, vertigo, back surgery    PT Comments    Pt motivated and with significant progress with mobility this pm.  Pt up to ambulate increased distance in hall, negotiated stairs twice, reviewed bed mobility, and reviewed written HEP.  Pt spouse arrived after session and reviewed stairs with spouse.   Recommendations for follow up therapy are one component of a multi-disciplinary discharge planning process, led by the attending physician.  Recommendations may be updated based on patient status, additional functional criteria and insurance authorization.  Follow Up Recommendations       Assistance Recommended at Discharge Intermittent Supervision/Assistance  Patient can return home with the following A little help with walking and/or transfers;A little help with bathing/dressing/bathroom;Assistance with cooking/housework;Help with stairs or ramp for entrance   Equipment Recommendations  Rolling walker (2 wheels);BSC/3in1    Recommendations for Other Services       Precautions / Restrictions Precautions Precautions: Fall;Knee Restrictions Weight Bearing Restrictions: No LLE Weight Bearing: Weight bearing as tolerated     Mobility  Bed Mobility Overal bed mobility: Needs Assistance Bed Mobility: Supine to Sit, Sit to Supine     Supine to sit: Supervision Sit to supine: Min guard   General bed mobility comments: Increased time with min guard for safety only    Transfers Overall transfer level: Needs assistance Equipment used: Rolling walker (2 wheels) Transfers: Sit to/from Stand Sit to Stand: Min guard   Step pivot transfers: Min guard       General transfer comment: cues for LE management, use of  UEs to self assist; step/pvt recliner to bed    Ambulation/Gait Ambulation/Gait assistance: Min guard Gait Distance (Feet): 20 Feet (and 10' x 2) Assistive device: Rolling walker (2 wheels) Gait Pattern/deviations: Step-to pattern, Decreased stride length, Decreased weight shift to left Gait velocity: decreased     General Gait Details: cues for sequence, posture; position from RW; and increased heel contact and knee ext on L.  Distance ltd by fatigue   Stairs Stairs: Yes Stairs assistance: Min assist Stair Management: No rails, Step to pattern, Forwards, With walker Number of Stairs: 6 General stair comments: cues for sequence and foot/RW placement up/down 3 steps twice   Wheelchair Mobility    Modified Rankin (Stroke Patients Only)       Balance Overall balance assessment: Needs assistance Sitting-balance support: No upper extremity supported Sitting balance-Leahy Scale: Fair     Standing balance support: Single extremity supported Standing balance-Leahy Scale: Poor                              Cognition Arousal/Alertness: Awake/alert Behavior During Therapy: WFL for tasks assessed/performed Overall Cognitive Status: Within Functional Limits for tasks assessed                                 General Comments: Difficulty following cues and focussing on task.  Limited awareness of deficits with pt stating "I want to go home" while falling backwards on bed        Exercises Total Joint Exercises Ankle Circles/Pumps: AROM, Both, 15 reps, Supine Quad Sets: AROM, Both, 10 reps,  Supine Heel Slides: AAROM, Left, Supine, 15 reps Hip ABduction/ADduction: AAROM, Left, Supine, 15 reps Straight Leg Raises: AAROM, AROM, Left, Supine, 20 reps Goniometric ROM: AAROM at L knee -8 - 40    General Comments        Pertinent Vitals/Pain Pain Assessment Pain Assessment: 0-10 Pain Score: 6  Pain Location: L knee - with movement Pain Descriptors /  Indicators: Aching, Grimacing, Guarding, Sore Pain Intervention(s): Limited activity within patient's tolerance, Monitored during session, Premedicated before session, Ice applied    Home Living                          Prior Function            PT Goals (current goals can now be found in the care plan section) Acute Rehab PT Goals Patient Stated Goal: return home PT Goal Formulation: With patient/family Time For Goal Achievement: 12/31/22 Potential to Achieve Goals: Good Progress towards PT goals: Progressing toward goals    Frequency    7X/week      PT Plan Current plan remains appropriate    Co-evaluation              AM-PAC PT "6 Clicks" Mobility   Outcome Measure  Help needed turning from your back to your side while in a flat bed without using bedrails?: A Little Help needed moving from lying on your back to sitting on the side of a flat bed without using bedrails?: A Little Help needed moving to and from a bed to a chair (including a wheelchair)?: A Little Help needed standing up from a chair using your arms (e.g., wheelchair or bedside chair)?: A Little Help needed to walk in hospital room?: A Little Help needed climbing 3-5 steps with a railing? : A Little 6 Click Score: 18    End of Session Equipment Utilized During Treatment: Gait belt Activity Tolerance: Patient tolerated treatment well;Patient limited by fatigue Patient left: in bed;with call bell/phone within reach;with bed alarm set Nurse Communication: Mobility status PT Visit Diagnosis: Other abnormalities of gait and mobility (R26.89)     Time: 1914-7829 PT Time Calculation (min) (ACUTE ONLY): 49 min  Charges:  $Gait Training: 8-22 mins $Therapeutic Activity: 23-37 mins                     Mauro Kaufmann PT Acute Rehabilitation Services Pager [phone] Office [phone]    Carin Shipp 12/20/2022, 4:10 PM

## 2022-12-22 DIAGNOSIS — M25562 Pain in left knee: Secondary | ICD-10-CM | POA: Diagnosis not present

## 2022-12-24 DIAGNOSIS — M25562 Pain in left knee: Secondary | ICD-10-CM | POA: Diagnosis not present

## 2022-12-26 ENCOUNTER — Encounter: Payer: Self-pay | Admitting: Family Medicine

## 2022-12-26 DIAGNOSIS — M25562 Pain in left knee: Secondary | ICD-10-CM | POA: Diagnosis not present

## 2022-12-29 NOTE — Telephone Encounter (Signed)
Please see the MyChart message reply(ies) for my assessment and plan.  The patient gave consent for this Medical Advice Message and is aware that it may result in a bill to their insurance company as well as the possibility that this may result in a co-payment or deductible. They are an established patient, but are not seeking medical advice exclusively about a problem treated during an in person or video visit in the last 7 days. I did not recommend an in person or video visit within 7 days of my reply.  I spent a total of 10 minutes cumulative time within 7 days through MyChart messaging Haasini Patnaude, MD  

## 2022-12-30 DIAGNOSIS — Z471 Aftercare following joint replacement surgery: Secondary | ICD-10-CM | POA: Diagnosis not present

## 2022-12-30 DIAGNOSIS — Z96652 Presence of left artificial knee joint: Secondary | ICD-10-CM | POA: Diagnosis not present

## 2022-12-31 ENCOUNTER — Ambulatory Visit: Payer: 59 | Admitting: Gastroenterology

## 2023-01-02 DIAGNOSIS — M25562 Pain in left knee: Secondary | ICD-10-CM | POA: Diagnosis not present

## 2023-01-13 ENCOUNTER — Encounter: Payer: Self-pay | Admitting: Family Medicine

## 2023-01-13 DIAGNOSIS — M25562 Pain in left knee: Secondary | ICD-10-CM | POA: Diagnosis not present

## 2023-01-15 DIAGNOSIS — M25562 Pain in left knee: Secondary | ICD-10-CM | POA: Diagnosis not present

## 2023-01-15 NOTE — Telephone Encounter (Signed)
Can you check on this for me?  

## 2023-01-15 NOTE — Telephone Encounter (Signed)
Pt scheduled  

## 2023-01-19 ENCOUNTER — Ambulatory Visit: Payer: 59 | Admitting: Family Medicine

## 2023-01-19 DIAGNOSIS — M25562 Pain in left knee: Secondary | ICD-10-CM | POA: Diagnosis not present

## 2023-01-27 DIAGNOSIS — Z471 Aftercare following joint replacement surgery: Secondary | ICD-10-CM | POA: Diagnosis not present

## 2023-01-27 DIAGNOSIS — Z96652 Presence of left artificial knee joint: Secondary | ICD-10-CM | POA: Diagnosis not present

## 2023-01-29 ENCOUNTER — Ambulatory Visit (INDEPENDENT_AMBULATORY_CARE_PROVIDER_SITE_OTHER): Payer: 59 | Admitting: *Deleted

## 2023-01-29 DIAGNOSIS — Z0000 Encounter for general adult medical examination without abnormal findings: Secondary | ICD-10-CM

## 2023-01-29 NOTE — Progress Notes (Signed)
Subjective:   Amanda Saunders is a 67 y.o. female who presents for Medicare Annual (Subsequent) preventive examination.  Visit Complete: Virtual  I connected with  DHRITI FALES on 01/29/23 by a audio enabled telemedicine application and verified that I am speaking with the correct person using two identifiers.  Patient Location: Home  Provider Location: Office/Clinic  I discussed the limitations of evaluation and management by telemedicine. The patient expressed understanding and agreed to proceed.  Review of Systems     Cardiac Risk Factors include: advanced age (>18men, >43 women);dyslipidemia;diabetes mellitus;hypertension     Objective:    Today's Vitals   There is no height or weight on file to calculate BMI.     01/29/2023    3:43 PM 12/17/2022    3:25 PM 12/17/2022    6:29 AM 12/04/2022    8:16 AM 07/16/2022    8:51 AM 07/03/2022    7:33 AM 05/10/2022    8:44 PM  Advanced Directives  Does Patient Have a Medical Advance Directive? No No No No No No No  Would patient like information on creating a medical advance directive? Yes (MAU/Ambulatory/Procedural Areas - Information given) No - Patient declined No - Patient declined Yes (MAU/Ambulatory/Procedural Areas - Information given)  No - Patient declined No - Patient declined    Current Medications (verified) Outpatient Encounter Medications as of 01/29/2023  Medication Sig   acetaminophen (TYLENOL) 650 MG CR tablet Take 1,300 mg by mouth every 8 (eight) hours as needed for pain.   Cholecalciferol (VITAMIN D-3) 125 MCG (5000 UT) TABS Take 5,000 Units by mouth daily.   CINNAMON PO Take 1 capsule by mouth daily.   ELIQUIS 5 MG TABS tablet TAKE ONE TABLET BY MOUTH EVERY MORNING and TAKE ONE TABLET BY MOUTH EVERYDAY AT BEDTIME   furosemide (LASIX) 20 MG tablet Take 1 tablet once daily and may take an extra tablet as needed for shortness of breath or weight gain   gabapentin (NEURONTIN) 800 MG tablet TAKE ONE  TABLET BY MOUTH every morning and TAKE ONE TABLET BY MOUTH everyday AT bedtime   Ginger, Zingiber officinalis, (GINGER PO) Take by mouth.   hydroxychloroquine (PLAQUENIL) 200 MG tablet Take 200 mg by mouth 2 (two) times daily.   losartan (COZAAR) 50 MG tablet Take 1 tablet (50 mg total) by mouth every morning.   Magnesium 250 MG TABS Take 250 mg by mouth daily.   metFORMIN (GLUCOPHAGE) 500 MG tablet Take 1 tablet (500 mg total) by mouth at bedtime.   methocarbamol (ROBAXIN) 500 MG tablet Take 1 tablet (500 mg total) by mouth every 6 (six) hours as needed for muscle spasms.   metoprolol succinate (TOPROL-XL) 50 MG 24 hr tablet Take 1 tablet (50 mg total) by mouth daily. Take with or immediately following a meal.   Omega 3-6-9 Fatty Acids (TRIPLE OMEGA COMPLEX PO) Take 1 capsule by mouth daily.   omeprazole (PRILOSEC) 40 MG capsule TAKE ONE CAPSULE BY MOUTH ONCE DAILY   ondansetron (ZOFRAN) 4 MG tablet Take 1 tablet (4 mg total) by mouth every 8 (eight) hours as needed for nausea or vomiting.   rosuvastatin (CRESTOR) 20 MG tablet TAKE ONE TABLET BY MOUTH EVERYDAY AT BEDTIME   Semaglutide, 2 MG/DOSE, 8 MG/3ML SOPN Inject 2 mg as directed once a week.   sucralfate (CARAFATE) 1 g tablet Take 1 tablet (1 g total) by mouth 4 (four) times daily -  with meals and at bedtime. As needed   traMADol (  ULTRAM) 50 MG tablet Take 1 tablet (50 mg total) by mouth every 6 (six) hours as needed for moderate pain.   TURMERIC PO Take 1 capsule by mouth daily.   Vitamin A 2400 MCG (8000 UT) CAPS Take 2,400 Units by mouth daily.   No facility-administered encounter medications on file as of 01/29/2023.    Allergies (verified) Pholcodine and Codeine   History: Past Medical History:  Diagnosis Date   Aortic atherosclerosis (HCC)    Arthritis    Cardiomyopathy (HCC)    Diabetes mellitus without complication (HCC)    Dyspnea    Dysrhythmia    Headache    Heart murmur    at birth   Hypertension    Internal  hemorrhoids    Lupus (HCC)    PUD (peptic ulcer disease)    Stroke (HCC) 08/13/2018   Vertigo    Past Surgical History:  Procedure Laterality Date   BACK SURGERY     CARDIOVERSION N/A 07/03/2022   Procedure: CARDIOVERSION;  Surgeon: Little Ishikawa, MD;  Location: [facility] ENDOSCOPY;  Service: Cardiovascular;  Laterality: N/A;   CARPAL TUNNEL RELEASE     COLONOSCOPY     KNEE ARTHROPLASTY Left 12/17/2022   Procedure: COMPUTER ASSISTED TOTAL KNEE ARTHROPLASTY;  Surgeon: Samson Frederic, MD;  Location: [facility] ORS;  Service: Orthopedics;  Laterality: Left;   KNEE CARTILAGE SURGERY     NECK SURGERY     2015   UPPER GASTROINTESTINAL ENDOSCOPY     Family History  Problem Relation Age of Onset   Hypertension Mother    Arthritis Mother    Heart failure Mother    Stroke Mother    Kidney disease Father    Hypertension Father    Colon cancer Neg Hx    Esophageal cancer Neg Hx    Rectal cancer Neg Hx    Stomach cancer Neg Hx    Social History   Socioeconomic History   Marital status: Single    Spouse name: Not on file   Number of children: 1   Years of education: 12   Highest education level: Not on file  Occupational History   Occupation: retired    Comment: retired  Tobacco Use   Smoking status: Former    Current packs/day: 0.00    Types: Cigarettes    Quit date: 07/21/2004    Years since quitting: 18.5   Smokeless tobacco: Never  Vaping Use   Vaping status: Never Used  Substance and Sexual Activity   Alcohol use: Not Currently    Comment: occasional   Drug use: No   Sexual activity: Not on file  Other Topics Concern   Not on file  Social History Narrative   Lives one level with son/grandchildren; Left handed; high school grad; retired; walks for exercise; no caffeine   Social Determinants of Health   Financial Resource Strain: Low Risk  (09/06/2021)   Overall Financial Resource Strain (CARDIA)    Difficulty of Paying Living Expenses: Not hard at all  Food  Insecurity: No Food Insecurity (12/17/2022)   Hunger Vital Sign    Worried About Running Out of Food in the Last Year: Never true    Ran Out of Food in the Last Year: Never true  Transportation Needs: No Transportation Needs (12/17/2022)   PRAPARE - Administrator, Civil Service (Medical): No    Lack of Transportation (Non-Medical): No  Physical Activity: Inactive (01/29/2023)   Exercise Vital Sign    Days of Exercise  per Week: 0 days    Minutes of Exercise per Session: 0 min  Stress: No Stress Concern Present (12/25/2020)   [employer] of Occupational Health - Occupational Stress Questionnaire    Feeling of Stress : Not at all  Social Connections: Moderately Isolated (12/25/2020)   Social Connection and Isolation Panel [NHANES]    Frequency of Communication with Friends and Family: More than three times a week    Frequency of Social Gatherings with Friends and Family: More than three times a week    Attends Religious Services: More than 4 times per year    Active Member of [employer] or Organizations: No    Attends Engineer, structural: Never    Marital Status: Never married    Tobacco Counseling Counseling given: Not Answered   Clinical Intake:  Pre-visit preparation completed: Yes  Pain : No/denies pain  Nutritional Risks: None Diabetes: Yes CBG done?: No Did pt. bring in CBG monitor from home?: No  How often do you need to have someone help you when you read instructions, pamphlets, or other written materials from your doctor or pharmacy?: 1 - Never  Interpreter Needed?: No  Information entered by :: Donne Anon, CMA   Activities of Daily Living    01/29/2023    3:39 PM 12/17/2022    3:27 PM  In your present state of health, do you have any difficulty performing the following activities:  Hearing? 0   Vision? 0   Difficulty concentrating or making decisions? 1   Comment remembering   Walking or climbing stairs? 1   Comment recent left knee  replacement on May 29th   Dressing or bathing? 0   Doing errands, shopping? 0 0  Preparing Food and eating ? N   Using the Toilet? N   In the past six months, have you accidently leaked urine? N   Do you have problems with loss of bowel control? N   Managing your Medications? N   Managing your Finances? N   Housekeeping or managing your Housekeeping? N     Patient Care Team: Copland, Gwenlyn Found, MD as PCP - General (Family Medicine) Jens Som Madolyn Frieze, MD as PCP - Cardiology (Cardiology) Drema Dallas, DO as Consulting Physician (Neurology) Windy Carina, PA-C (Inactive) (Rheumatology)  Indicate any recent Medical Services you may have received from other than [facility] providers in the past year (date may be approximate).     Assessment:   This is a routine wellness examination for Javae.  Hearing/Vision screen No results found.  Dietary issues and exercise activities discussed:     Goals Addressed   None    Depression Screen    01/29/2023    3:42 PM 05/13/2022    9:53 AM 01/02/2022    1:10 PM 09/23/2021    9:53 AM 07/29/2021   11:11 AM 01/28/2021    3:10 PM 12/25/2020    9:45 AM  PHQ 2/9 Scores  PHQ - 2 Score 0 0 0 0 0 0 0    Fall Risk    01/29/2023    3:41 PM 07/16/2022    8:51 AM 05/13/2022    9:53 AM 01/02/2022    1:10 PM 10/15/2021   11:05 AM  Fall Risk   Falls in the past year? 1 1 0 0 0  Number falls in past yr: 0 1 0 0 0  Injury with Fall? 0 0 0 0 0  Risk for fall due to : No Fall Risks  No Fall Risks No Fall Risks   Follow up Falls evaluation completed Falls evaluation completed Falls evaluation completed Falls evaluation completed     MEDICARE RISK AT HOME:  Medicare Risk at Home - 01/29/23 1541     Any stairs in or around the home? Yes   3 steps to the front door   If so, are there any without handrails? Yes    Home free of loose throw rugs in walkways, pet beds, electrical cords, etc? Yes    Adequate lighting in your home to reduce risk of falls? Yes     Life alert? No    Use of a cane, walker or w/c? Yes    Grab bars in the bathroom? No    Shower chair or bench in shower? Yes    Elevated toilet seat or a handicapped toilet? No             TIMED UP AND GO:  Was the test performed?  No    Cognitive Function:    05/14/2016   10:47 AM  MMSE - Mini Mental State Exam  Orientation to time 5  Orientation to Place 5  Registration 3  Attention/ Calculation 4  Recall 3  Language- name 2 objects 2  Language- repeat 1  Language- follow 3 step command 3  Language- read & follow direction 1  Write a sentence 1  Copy design 1  Total score 29        01/29/2023    3:45 PM 01/02/2022    1:15 PM  6CIT Screen  What Year? 0 points 0 points  What month? 0 points 0 points  What time? 0 points 0 points  Count back from 20 0 points 0 points  Months in reverse 0 points 0 points  Repeat phrase 0 points 0 points  Total Score 0 points 0 points    Immunizations Immunization History  Administered Date(s) Administered   Moderna Sars-Covid-2 Vaccination 11/17/2019, 12/15/2019, 07/06/2020   PNEUMOCOCCAL CONJUGATE-20 01/28/2021   Tdap 03/12/2016    TDAP status: Up to date  Flu Vaccine status: Up to date  Pneumococcal vaccine status: Up to date  Covid-19 vaccine status: Information provided on how to obtain vaccines.   Qualifies for Shingles Vaccine? Yes   Zostavax completed No   Shingrix Completed?: No.    Education has been provided regarding the importance of this vaccine. Patient has been advised to call insurance company to determine out of pocket expense if they have not yet received this vaccine. Advised may also receive vaccine at local pharmacy or Health Dept. Verbalized acceptance and understanding.  Screening Tests Health Maintenance  Topic Date Due   Zoster Vaccines- Shingrix (1 of 2) Never done   COVID-19 Vaccine (4 - 2023-24 season) 03/21/2022   PAP SMEAR-Modifier  08/03/2022   OPHTHALMOLOGY EXAM  08/08/2022   FOOT  EXAM  02/14/2023   INFLUENZA VACCINE  02/19/2023   Diabetic kidney evaluation - Urine ACR  05/08/2023   HEMOGLOBIN A1C  06/06/2023   MAMMOGRAM  08/27/2023   Diabetic kidney evaluation - eGFR measurement  12/18/2023   Medicare Annual Wellness (AWV)  01/29/2024   DTaP/Tdap/Td (2 - Td or Tdap) 03/12/2026   Colonoscopy  11/26/2031   Pneumonia Vaccine 20+ Years old  Completed   DEXA SCAN  Completed   Hepatitis C Screening  Completed   HIV Screening  Completed   HPV VACCINES  Aged Out    Health Maintenance  Health Maintenance Due  Topic  Date Due   Zoster Vaccines- Shingrix (1 of 2) Never done   COVID-19 Vaccine (4 - 2023-24 season) 03/21/2022   PAP SMEAR-Modifier  08/03/2022   OPHTHALMOLOGY EXAM  08/08/2022    Colorectal cancer screening: Type of screening: Colonoscopy. Completed 11/25/21. Repeat every 10 years  Mammogram status: Completed 08/26/21. Repeat every year  Bone Density status: Completed 06/05/22. Results reflect: Bone density results: OSTEOPENIA. Repeat every 2 years.  Lung Cancer Screening: (Low Dose CT Chest recommended if Age 8-80 years, 20 pack-year currently smoking OR have quit w/in 15years.) does not qualify.   Additional Screening:  Hepatitis C Screening: does qualify; Completed 08/02/18  Vision Screening: Recommended annual ophthalmology exams for early detection of glaucoma and other disorders of the eye. Is the patient up to date with their annual eye exam?  No  Who is the provider or what is the name of the office in which the patient attends annual eye exams? [facility] If pt is not established with a provider, would they like to be referred to a provider to establish care? No .   Dental Screening: Recommended annual dental exams for proper oral hygiene  Diabetic Foot Exam: Diabetic Foot Exam: Completed 02/13/22  Community Resource Referral / Chronic Care Management: CRR required this visit?  No   CCM required this visit?  No     Plan:     I have  personally reviewed and noted the following in the patient's chart:   Medical and social history Use of alcohol, tobacco or illicit drugs  Current medications and supplements including opioid prescriptions. Patient is not currently taking opioid prescriptions. Functional ability and status Nutritional status Physical activity Advanced directives List of other physicians Hospitalizations, surgeries, and ER visits in previous 12 months Vitals Screenings to include cognitive, depression, and falls Referrals and appointments  In addition, I have reviewed and discussed with patient certain preventive protocols, quality metrics, and best practice recommendations. A written personalized care plan for preventive services as well as general preventive health recommendations were provided to patient.     Donne Anon, CMA   01/29/2023   After Visit Summary: (MyChart) Due to this being a telephonic visit, the after visit summary with patients personalized plan was offered to patient via MyChart   Nurse Notes: None

## 2023-01-29 NOTE — Patient Instructions (Signed)
[name] , Thank you for taking time to come for your Medicare Wellness Visit. I appreciate your ongoing commitment to your health goals. Please review the following plan we discussed and let me know if I can assist you in the future.     This is a list of the screening recommended for you and due dates:  Health Maintenance  Topic Date Due   Zoster (Shingles) Vaccine (1 of 2) Never done   COVID-19 Vaccine (4 - 2023-24 season) 03/21/2022   Pap Smear  08/03/2022   Eye exam for diabetics  08/08/2022   Complete foot exam   02/14/2023   Flu Shot  02/19/2023   Yearly kidney health urinalysis for diabetes  05/08/2023   Hemoglobin A1C  06/06/2023   Mammogram  08/27/2023   Yearly kidney function blood test for diabetes  12/18/2023   Medicare Annual Wellness Visit  01/29/2024   DTaP/Tdap/Td vaccine (2 - Td or Tdap) 03/12/2026   Colon Cancer Screening  11/26/2031   Pneumonia Vaccine  Completed   DEXA scan (bone density measurement)  Completed   Hepatitis C Screening  Completed   HIV Screening  Completed   HPV Vaccine  Aged Out    Next appointment: Follow up in one year for your annual wellness visit.   Preventive Care 24 Years and Older, Female Preventive care refers to lifestyle choices and visits with your health care provider that can promote health and wellness. What does preventive care include? A yearly physical exam. This is also called an annual well check. Dental exams once or twice a year. Routine eye exams. Ask your health care provider how often you should have your eyes checked. Personal lifestyle choices, including: Daily care of your teeth and gums. Regular physical activity. Eating a healthy diet. Avoiding tobacco and drug use. Limiting alcohol use. Practicing safe sex. Taking low-dose aspirin every day. Taking vitamin and mineral supplements as recommended by your health care provider. What happens during an annual well check? The services and screenings  done by your health care provider during your annual well check will depend on your age, overall health, lifestyle risk factors, and family history of disease. Counseling  Your health care provider may ask you questions about your: Alcohol use. Tobacco use. Drug use. Emotional well-being. Home and relationship well-being. Sexual activity. Eating habits. History of falls. Memory and ability to understand (cognition). Work and work Astronomer. Reproductive health. Screening  You may have the following tests or measurements: Height, weight, and BMI. Blood pressure. Lipid and cholesterol levels. These may be checked every 5 years, or more frequently if you are over 68 years old. Skin check. Lung cancer screening. You may have this screening every year starting at age 63 if you have a 30-pack-year history of smoking and currently smoke or have quit within the past 15 years. Fecal occult blood test (FOBT) of the stool. You may have this test every year starting at age 20. Flexible sigmoidoscopy or colonoscopy. You may have a sigmoidoscopy every 5 years or a colonoscopy every 10 years starting at age 72. Hepatitis C blood test. Hepatitis B blood test. Sexually transmitted disease (STD) testing. Diabetes screening. This is done by checking your blood sugar (glucose) after you have not eaten for a while (fasting). You may have this done every 1-3 years. Bone density scan. This is done to screen for osteoporosis. You may have this done starting at age 15. Mammogram. This may be done every 1-2 years. Talk to your  health care provider about how often you should have regular mammograms. Talk with your health care provider about your test results, treatment options, and if necessary, the need for more tests. Vaccines  Your health care provider may recommend certain vaccines, such as: Influenza vaccine. This is recommended every year. Tetanus, diphtheria, and acellular pertussis (Tdap, Td)  vaccine. You may need a Td booster every 10 years. Zoster vaccine. You may need this after age 67. Pneumococcal 13-valent conjugate (PCV13) vaccine. One dose is recommended after age 62. Pneumococcal polysaccharide (PPSV23) vaccine. One dose is recommended after age 81. Talk to your health care provider about which screenings and vaccines you need and how often you need them. This information is not intended to replace advice given to you by your health care provider. Make sure you discuss any questions you have with your health care provider. Document Released: 08/03/2015 Document Revised: 03/26/2016 Document Reviewed: 05/08/2015 Elsevier Interactive Patient Education  2017 [employer].  Fall Prevention in the Home Falls can cause injuries. They can happen to people of all ages. There are many things you can do to make your home safe and to help prevent falls. What can I do on the outside of my home? Regularly fix the edges of walkways and driveways and fix any cracks. Remove anything that might make you trip as you walk through a door, such as a raised step or threshold. Trim any bushes or trees on the path to your home. Use bright outdoor lighting. Clear any walking paths of anything that might make someone trip, such as rocks or tools. Regularly check to see if handrails are loose or broken. Make sure that both sides of any steps have handrails. Any raised decks and porches should have guardrails on the edges. Have any leaves, snow, or ice cleared regularly. Use sand or salt on walking paths during winter. Clean up any spills in your garage right away. This includes oil or grease spills. What can I do in the bathroom? Use night lights. Install grab bars by the toilet and in the tub and shower. Do not use towel bars as grab bars. Use non-skid mats or decals in the tub or shower. If you need to sit down in the shower, use a plastic, non-slip stool. Keep the floor dry. Clean up any  water that spills on the floor as soon as it happens. Remove soap buildup in the tub or shower regularly. Attach bath mats securely with double-sided non-slip rug tape. Do not have throw rugs and other things on the floor that can make you trip. What can I do in the bedroom? Use night lights. Make sure that you have a light by your bed that is easy to reach. Do not use any sheets or blankets that are too big for your bed. They should not hang down onto the floor. Have a firm chair that has side arms. You can use this for support while you get dressed. Do not have throw rugs and other things on the floor that can make you trip. What can I do in the kitchen? Clean up any spills right away. Avoid walking on wet floors. Keep items that you use a lot in easy-to-reach places. If you need to reach something above you, use a strong step stool that has a grab bar. Keep electrical cords out of the way. Do not use floor polish or wax that makes floors slippery. If you must use wax, use non-skid floor wax. Do not have  throw rugs and other things on the floor that can make you trip. What can I do with my stairs? Do not leave any items on the stairs. Make sure that there are handrails on both sides of the stairs and use them. Fix handrails that are broken or loose. Make sure that handrails are as long as the stairways. Check any carpeting to make sure that it is firmly attached to the stairs. Fix any carpet that is loose or worn. Avoid having throw rugs at the top or bottom of the stairs. If you do have throw rugs, attach them to the floor with carpet tape. Make sure that you have a light switch at the top of the stairs and the bottom of the stairs. If you do not have them, ask someone to add them for you. What else can I do to help prevent falls? Wear shoes that: Do not have high heels. Have rubber bottoms. Are comfortable and fit you well. Are closed at the toe. Do not wear sandals. If you use a  stepladder: Make sure that it is fully opened. Do not climb a closed stepladder. Make sure that both sides of the stepladder are locked into place. Ask someone to hold it for you, if possible. Clearly mark and make sure that you can see: Any grab bars or handrails. First and last steps. Where the edge of each step is. Use tools that help you move around (mobility aids) if they are needed. These include: Canes. Walkers. Scooters. Crutches. Turn on the lights when you go into a dark area. Replace any light bulbs as soon as they burn out. Set up your furniture so you have a clear path. Avoid moving your furniture around. If any of your floors are uneven, fix them. If there are any pets around you, be aware of where they are. Review your medicines with your doctor. Some medicines can make you feel dizzy. This can increase your chance of falling. Ask your doctor what other things that you can do to help prevent falls. This information is not intended to replace advice given to you by your health care provider. Make sure you discuss any questions you have with your health care provider. Document Released: 05/03/2009 Document Revised: 12/13/2015 Document Reviewed: 08/11/2014 Elsevier Interactive Patient Education  2017 [employer].

## 2023-01-30 DIAGNOSIS — M25562 Pain in left knee: Secondary | ICD-10-CM | POA: Diagnosis not present

## 2023-02-04 ENCOUNTER — Encounter: Payer: Self-pay | Admitting: *Deleted

## 2023-02-04 ENCOUNTER — Other Ambulatory Visit: Payer: Self-pay | Admitting: Family Medicine

## 2023-02-04 ENCOUNTER — Other Ambulatory Visit: Payer: Self-pay | Admitting: Cardiology

## 2023-02-04 DIAGNOSIS — R1013 Epigastric pain: Secondary | ICD-10-CM

## 2023-02-04 DIAGNOSIS — Z8673 Personal history of transient ischemic attack (TIA), and cerebral infarction without residual deficits: Secondary | ICD-10-CM

## 2023-02-04 NOTE — Telephone Encounter (Signed)
Eliquis 5mg  refill request received. Patient is 67 years old, weight-78kg, Crea-0.93 on 12/18/22, Diagnosis-Afib, and last seen by Bernadene Person on 07/28/22. Dose is appropriate based on dosing criteria. Will send in refill to requested pharmacy.

## 2023-02-10 ENCOUNTER — Ambulatory Visit: Payer: 59 | Admitting: Family Medicine

## 2023-02-10 DIAGNOSIS — M25562 Pain in left knee: Secondary | ICD-10-CM | POA: Diagnosis not present

## 2023-02-12 ENCOUNTER — Ambulatory Visit: Payer: 59 | Admitting: Family Medicine

## 2023-02-12 DIAGNOSIS — M25562 Pain in left knee: Secondary | ICD-10-CM | POA: Diagnosis not present

## 2023-02-16 ENCOUNTER — Ambulatory Visit: Payer: 59 | Admitting: Family Medicine

## 2023-03-03 ENCOUNTER — Ambulatory Visit: Payer: Self-pay | Admitting: Student

## 2023-03-03 ENCOUNTER — Other Ambulatory Visit: Payer: Self-pay

## 2023-03-03 ENCOUNTER — Encounter (HOSPITAL_COMMUNITY): Payer: Self-pay | Admitting: Orthopedic Surgery

## 2023-03-03 NOTE — H&P (Signed)
PREOPERATIVE H&P  Chief Complaint: * No surgery found *  HPI: Amanda Saunders is a 67 y.o. female who presents for preoperative history and physical with a diagnosis of left knee wound dehiscence.History of left TKA on 12/17/22. Recommend debridement, exploration, with wound closure.  She has elected for surgical management.    Past Medical History:  Diagnosis Date   Aortic atherosclerosis (HCC)    Arthritis    Cardiomyopathy (HCC)    Diabetes mellitus without complication (HCC)    Dyspnea    Dysrhythmia    Headache    Heart murmur    at birth   Hypertension    Internal hemorrhoids    Lupus (HCC)    PUD (peptic ulcer disease)    Stroke (HCC) 08/13/2018   Vertigo    Past Surgical History:  Procedure Laterality Date   BACK SURGERY     CARDIOVERSION N/A 07/03/2022   Procedure: CARDIOVERSION;  Surgeon: Little Ishikawa, MD;  Location: [facility] ENDOSCOPY;  Service: Cardiovascular;  Laterality: N/A;   CARPAL TUNNEL RELEASE     COLONOSCOPY     KNEE ARTHROPLASTY Left 12/17/2022   Procedure: COMPUTER ASSISTED TOTAL KNEE ARTHROPLASTY;  Surgeon: Samson Frederic, MD;  Location: [facility] ORS;  Service: Orthopedics;  Laterality: Left;   KNEE CARTILAGE SURGERY     NECK SURGERY     2015   UPPER GASTROINTESTINAL ENDOSCOPY     Social History   Socioeconomic History   Marital status: Single    Spouse name: Not on file   Number of children: 1   Years of education: 43   Highest education level: Not on file  Occupational History   Occupation: retired    Comment: retired  Tobacco Use   Smoking status: Former    Current packs/day: 0.00    Types: Cigarettes    Quit date: 07/21/2004    Years since quitting: 18.6   Smokeless tobacco: Never  Vaping Use   Vaping status: Never Used  Substance and Sexual Activity   Alcohol use: Not Currently    Comment: occasional   Drug use: No   Sexual activity: Not on file  Other Topics Concern   Not on file  Social History Narrative   Lives one  level with son/grandchildren; Left handed; high school grad; retired; walks for exercise; no caffeine   Social Determinants of Health   Financial Resource Strain: Low Risk  (09/06/2021)   Overall Financial Resource Strain (CARDIA)    Difficulty of Paying Living Expenses: Not hard at all  Food Insecurity: No Food Insecurity (12/17/2022)   Hunger Vital Sign    Worried About Running Out of Food in the Last Year: Never true    Ran Out of Food in the Last Year: Never true  Transportation Needs: No Transportation Needs (12/17/2022)   PRAPARE - Administrator, Civil Service (Medical): No    Lack of Transportation (Non-Medical): No  Physical Activity: Inactive (01/29/2023)   Exercise Vital Sign    Days of Exercise per Week: 0 days    Minutes of Exercise per Session: 0 min  Stress: No Stress Concern Present (12/25/2020)   [employer] of Occupational Health - Occupational Stress Questionnaire    Feeling of Stress : Not at all  Social Connections: Moderately Isolated (12/25/2020)   Social Connection and Isolation Panel [NHANES]    Frequency of Communication with Friends and Family: More than three times a week    Frequency of Social Gatherings with Friends and Family:  More than three times a week    Attends Religious Services: More than 4 times per year    Active Member of Clubs or Organizations: No    Attends Banker Meetings: Never    Marital Status: Never married   Family History  Problem Relation Age of Onset   Hypertension Mother    Arthritis Mother    Heart failure Mother    Stroke Mother    Kidney disease Father    Hypertension Father    Colon cancer Neg Hx    Esophageal cancer Neg Hx    Rectal cancer Neg Hx    Stomach cancer Neg Hx    Allergies  Allergen Reactions   Pholcodine Nausea Only   Codeine Nausea And Vomiting    ALL FORMS   Prior to Admission medications   Medication Sig Start Date End Date Taking? Authorizing Provider  acetaminophen  (TYLENOL) 650 MG CR tablet Take 1,300 mg by mouth every 8 (eight) hours as needed for pain.    [provider]  cephALEXin (KEFLEX) 500 MG capsule Take 500 mg by mouth 2 (two) times daily. 03/03/23   [provider]  Cholecalciferol (VITAMIN D-3) 125 MCG (5000 UT) TABS Take 5,000 Units by mouth daily.    [provider]  CINNAMON PO Take 500 mg by mouth daily.    [provider]  ELIQUIS 5 MG TABS tablet TAKE ONE TABLET BY MOUTH EVERY MORNING and TAKE ONE TABLET BY MOUTH EVERYDAY AT BEDTIME 02/04/23   Lewayne Bunting, MD  furosemide (LASIX) 20 MG tablet Take 1 tablet once daily and may take an extra tablet as needed for shortness of breath or weight gain 06/17/22   Lewayne Bunting, MD  gabapentin (NEURONTIN) 800 MG tablet TAKE ONE TABLET BY MOUTH every morning and TAKE ONE TABLET BY MOUTH everyday AT bedtime 08/13/22   Drema Dallas, DO  Ginger, Zingiber officinalis, (GINGER PO) Take 550 mg by mouth daily.    [provider]  hydroxychloroquine (PLAQUENIL) 200 MG tablet Take 200 mg by mouth 2 (two) times daily. 11/24/22   [provider]  losartan (COZAAR) 50 MG tablet Take 1 tablet (50 mg total) by mouth every morning. 11/03/22   Copland, Gwenlyn Found, MD  Magnesium 250 MG TABS Take 250 mg by mouth daily.    [provider]  metFORMIN (GLUCOPHAGE) 500 MG tablet Take 1 tablet (500 mg total) by mouth at bedtime. 11/03/22   Copland, Gwenlyn Found, MD  methocarbamol (ROBAXIN) 500 MG tablet Take 1 tablet (500 mg total) by mouth every 6 (six) hours as needed for muscle spasms. Patient not taking: Reported on 03/03/2023 12/18/22   Clois Dupes, PA-C  metoprolol succinate (TOPROL-XL) 50 MG 24 hr tablet Take 1 tablet (50 mg total) by mouth daily. Take with or immediately following a meal. 11/14/22   Copland, Gwenlyn Found, MD  Omega 3-6-9 Fatty Acids (TRIPLE OMEGA COMPLEX PO) Take 1 capsule by mouth every other day.    [provider]  omeprazole  (PRILOSEC) 40 MG capsule TAKE ONE CAPSULE BY MOUTH ONCE DAILY 02/04/23   Copland, Gwenlyn Found, MD  ondansetron (ZOFRAN) 4 MG tablet Take 1 tablet (4 mg total) by mouth every 8 (eight) hours as needed for nausea or vomiting. 12/18/22 12/18/23  Clois Dupes, PA-C  rosuvastatin (CRESTOR) 20 MG tablet TAKE ONE TABLET BY MOUTH EVERYDAY AT BEDTIME 08/04/22   Copland, Gwenlyn Found, MD  Semaglutide, 2 MG/DOSE, 8 MG/3ML  SOPN Inject 2 mg as directed once a week. 11/19/22   Copland, Gwenlyn Found, MD  sucralfate (CARAFATE) 1 g tablet Take 1 tablet (1 g total) by mouth 4 (four) times daily -  with meals and at bedtime. As needed 04/12/22   Copland, Gwenlyn Found, MD  traMADol (ULTRAM) 50 MG tablet Take 1 tablet (50 mg total) by mouth every 6 (six) hours as needed for moderate pain. 12/18/22   , Alain Honey, PA-C  TURMERIC PO Take 500 mg by mouth daily.    [provider]  Vitamin A 2400 MCG (8000 UT) CAPS Take 2,400 Units by mouth daily.    [provider]     Positive ROS: All other systems have been reviewed and were otherwise negative with the exception of those mentioned in the HPI and as above.  Physical Exam: General: Alert, no acute distress Cardiovascular: No pedal edema Respiratory: No cyanosis, no use of accessory musculature GI: No organomegaly, abdomen is soft and non-tender Neurologic: Sensation intact distally Psychiatric: Patient is competent for consent with normal mood and affect Lymphatic: No axillary or cervical lymphadenopathy  MUSCULOSKELETAL: Examination of the left knee reveals a well healed incision except for 2.5cm x 1cm area with delayed healing. No erythema. No necrosis noted. Neurovascularly intact distally.  Assessment: Left knee wound dehiscence  Plan: Plan for irrigation and debridement left knee with wound closure.   The risks, benefits, and alternatives were discussed with the patient. There are risks associated with the surgery including, but not limited to, problems  with anesthesia (death), infection, instability (giving out of the joint), dislocation, differences in leg length/angulation/rotation, fracture of bones, loosening or failure of implants, hematoma (blood accumulation) which may require surgical drainage, blood clots, pulmonary embolism, nerve injury (foot drop and lateral thigh numbness), and blood vessel injury. The patient understands these risks and elects to proceed.   The risks benefits and alternatives were discussed with the patient including but not limited to the risks of nonoperative treatment, versus surgical intervention including infection, bleeding, nerve injury,  blood clots, cardiopulmonary complications, morbidity, mortality, among others, and they were willing to proceed.   Clois Dupes, PA-C [phone]   03/03/2023 2:09 PM

## 2023-03-03 NOTE — H&P (View-Only) (Signed)
PREOPERATIVE H&P  Chief Complaint: * No surgery found *  HPI: Amanda Saunders is a 67 y.o. female who presents for preoperative history and physical with a diagnosis of left knee wound dehiscence.History of left TKA on 12/17/22. Recommend debridement, exploration, with wound closure.  She has elected for surgical management.    Past Medical History:  Diagnosis Date   Aortic atherosclerosis (HCC)    Arthritis    Cardiomyopathy (HCC)    Diabetes mellitus without complication (HCC)    Dyspnea    Dysrhythmia    Headache    Heart murmur    at birth   Hypertension    Internal hemorrhoids    Lupus (HCC)    PUD (peptic ulcer disease)    Stroke (HCC) 08/13/2018   Vertigo    Past Surgical History:  Procedure Laterality Date   BACK SURGERY     CARDIOVERSION N/A 07/03/2022   Procedure: CARDIOVERSION;  Surgeon: Little Ishikawa, MD;  Location: [facility] ENDOSCOPY;  Service: Cardiovascular;  Laterality: N/A;   CARPAL TUNNEL RELEASE     COLONOSCOPY     KNEE ARTHROPLASTY Left 12/17/2022   Procedure: COMPUTER ASSISTED TOTAL KNEE ARTHROPLASTY;  Surgeon: Samson Frederic, MD;  Location: [facility] ORS;  Service: Orthopedics;  Laterality: Left;   KNEE CARTILAGE SURGERY     NECK SURGERY     2015   UPPER GASTROINTESTINAL ENDOSCOPY     Social History   Socioeconomic History   Marital status: Single    Spouse name: Not on file   Number of children: 1   Years of education: 43   Highest education level: Not on file  Occupational History   Occupation: retired    Comment: retired  Tobacco Use   Smoking status: Former    Current packs/day: 0.00    Types: Cigarettes    Quit date: 07/21/2004    Years since quitting: 18.6   Smokeless tobacco: Never  Vaping Use   Vaping status: Never Used  Substance and Sexual Activity   Alcohol use: Not Currently    Comment: occasional   Drug use: No   Sexual activity: Not on file  Other Topics Concern   Not on file  Social History Narrative   Lives one  level with son/grandchildren; Left handed; high school grad; retired; walks for exercise; no caffeine   Social Determinants of Health   Financial Resource Strain: Low Risk  (09/06/2021)   Overall Financial Resource Strain (CARDIA)    Difficulty of Paying Living Expenses: Not hard at all  Food Insecurity: No Food Insecurity (12/17/2022)   Hunger Vital Sign    Worried About Running Out of Food in the Last Year: Never true    Ran Out of Food in the Last Year: Never true  Transportation Needs: No Transportation Needs (12/17/2022)   PRAPARE - Administrator, Civil Service (Medical): No    Lack of Transportation (Non-Medical): No  Physical Activity: Inactive (01/29/2023)   Exercise Vital Sign    Days of Exercise per Week: 0 days    Minutes of Exercise per Session: 0 min  Stress: No Stress Concern Present (12/25/2020)   [employer] of Occupational Health - Occupational Stress Questionnaire    Feeling of Stress : Not at all  Social Connections: Moderately Isolated (12/25/2020)   Social Connection and Isolation Panel [NHANES]    Frequency of Communication with Friends and Family: More than three times a week    Frequency of Social Gatherings with Friends and Family:  More than three times a week    Attends Religious Services: More than 4 times per year    Active Member of Clubs or Organizations: No    Attends Banker Meetings: Never    Marital Status: Never married   Family History  Problem Relation Age of Onset   Hypertension Mother    Arthritis Mother    Heart failure Mother    Stroke Mother    Kidney disease Father    Hypertension Father    Colon cancer Neg Hx    Esophageal cancer Neg Hx    Rectal cancer Neg Hx    Stomach cancer Neg Hx    Allergies  Allergen Reactions   Pholcodine Nausea Only   Codeine Nausea And Vomiting    ALL FORMS   Prior to Admission medications   Medication Sig Start Date End Date Taking? Authorizing Provider  acetaminophen  (TYLENOL) 650 MG CR tablet Take 1,300 mg by mouth every 8 (eight) hours as needed for pain.    [provider]  cephALEXin (KEFLEX) 500 MG capsule Take 500 mg by mouth 2 (two) times daily. 03/03/23   [provider]  Cholecalciferol (VITAMIN D-3) 125 MCG (5000 UT) TABS Take 5,000 Units by mouth daily.    [provider]  CINNAMON PO Take 500 mg by mouth daily.    [provider]  ELIQUIS 5 MG TABS tablet TAKE ONE TABLET BY MOUTH EVERY MORNING and TAKE ONE TABLET BY MOUTH EVERYDAY AT BEDTIME 02/04/23   Lewayne Bunting, MD  furosemide (LASIX) 20 MG tablet Take 1 tablet once daily and may take an extra tablet as needed for shortness of breath or weight gain 06/17/22   Lewayne Bunting, MD  gabapentin (NEURONTIN) 800 MG tablet TAKE ONE TABLET BY MOUTH every morning and TAKE ONE TABLET BY MOUTH everyday AT bedtime 08/13/22   Drema Dallas, DO  Ginger, Zingiber officinalis, (GINGER PO) Take 550 mg by mouth daily.    [provider]  hydroxychloroquine (PLAQUENIL) 200 MG tablet Take 200 mg by mouth 2 (two) times daily. 11/24/22   [provider]  losartan (COZAAR) 50 MG tablet Take 1 tablet (50 mg total) by mouth every morning. 11/03/22   Copland, Gwenlyn Found, MD  Magnesium 250 MG TABS Take 250 mg by mouth daily.    [provider]  metFORMIN (GLUCOPHAGE) 500 MG tablet Take 1 tablet (500 mg total) by mouth at bedtime. 11/03/22   Copland, Gwenlyn Found, MD  methocarbamol (ROBAXIN) 500 MG tablet Take 1 tablet (500 mg total) by mouth every 6 (six) hours as needed for muscle spasms. Patient not taking: Reported on 03/03/2023 12/18/22   Clois Dupes, PA-C  metoprolol succinate (TOPROL-XL) 50 MG 24 hr tablet Take 1 tablet (50 mg total) by mouth daily. Take with or immediately following a meal. 11/14/22   Copland, Gwenlyn Found, MD  Omega 3-6-9 Fatty Acids (TRIPLE OMEGA COMPLEX PO) Take 1 capsule by mouth every other day.    [provider]  omeprazole  (PRILOSEC) 40 MG capsule TAKE ONE CAPSULE BY MOUTH ONCE DAILY 02/04/23   Copland, Gwenlyn Found, MD  ondansetron (ZOFRAN) 4 MG tablet Take 1 tablet (4 mg total) by mouth every 8 (eight) hours as needed for nausea or vomiting. 12/18/22 12/18/23  Clois Dupes, PA-C  rosuvastatin (CRESTOR) 20 MG tablet TAKE ONE TABLET BY MOUTH EVERYDAY AT BEDTIME 08/04/22   Copland, Gwenlyn Found, MD  Semaglutide, 2 MG/DOSE, 8 MG/3ML  SOPN Inject 2 mg as directed once a week. 11/19/22   Copland, Gwenlyn Found, MD  sucralfate (CARAFATE) 1 g tablet Take 1 tablet (1 g total) by mouth 4 (four) times daily -  with meals and at bedtime. As needed 04/12/22   Copland, Gwenlyn Found, MD  traMADol (ULTRAM) 50 MG tablet Take 1 tablet (50 mg total) by mouth every 6 (six) hours as needed for moderate pain. 12/18/22   Rosaline Ezekiel, Alain Honey, PA-C  TURMERIC PO Take 500 mg by mouth daily.    [provider]  Vitamin A 2400 MCG (8000 UT) CAPS Take 2,400 Units by mouth daily.    [provider]     Positive ROS: All other systems have been reviewed and were otherwise negative with the exception of those mentioned in the HPI and as above.  Physical Exam: General: Alert, no acute distress Cardiovascular: No pedal edema Respiratory: No cyanosis, no use of accessory musculature GI: No organomegaly, abdomen is soft and non-tender Neurologic: Sensation intact distally Psychiatric: Patient is competent for consent with normal mood and affect Lymphatic: No axillary or cervical lymphadenopathy  MUSCULOSKELETAL: Examination of the left knee reveals a well healed incision except for 2.5cm x 1cm area with delayed healing. No erythema. No necrosis noted. Neurovascularly intact distally.  Assessment: Left knee wound dehiscence  Plan: Plan for irrigation and debridement left knee with wound closure.   The risks, benefits, and alternatives were discussed with the patient. There are risks associated with the surgery including, but not limited to, problems  with anesthesia (death), infection, instability (giving out of the joint), dislocation, differences in leg length/angulation/rotation, fracture of bones, loosening or failure of implants, hematoma (blood accumulation) which may require surgical drainage, blood clots, pulmonary embolism, nerve injury (foot drop and lateral thigh numbness), and blood vessel injury. The patient understands these risks and elects to proceed.   The risks benefits and alternatives were discussed with the patient including but not limited to the risks of nonoperative treatment, versus surgical intervention including infection, bleeding, nerve injury,  blood clots, cardiopulmonary complications, morbidity, mortality, among others, and they were willing to proceed.   Clois Dupes, PA-C [phone]   03/03/2023 2:09 PM

## 2023-03-03 NOTE — Progress Notes (Signed)
For Anesthesia: PCP - Pearline Cables, MD  medical clearance note 10/08/22 in [facility] Cardiologist - Olga Millers, MD cardiac clearance NOTE 10/08/22 Neurologist- Drema Dallas, DO   Chest x-ray - greater than 1 year in [facility] EKG - 07/28/22 in [facility] Stress Test - greater than 2 years in [facility] ECHO - 07/08/2022 in [facility]  Cardiac Cath - N/A Pacemaker/ICD device last checked:N/A Pacemaker orders received: N/A Device Rep notified: N/A  Spinal Cord Stimulator: N/A  Sleep Study - N/A CPAP - N/A  Fasting Blood Sugar - N/A Checks Blood Sugar ___N/A__ times a day Date and result of last Hgb A1c- N/A  Last dose of GLP1 agonist- Semaglutide on 02/27/23 GLP1 instructions:   Last dose of SGLT-2 inhibitors- N/A SGLT-2 instructions:N/A  Blood Thinner Instructions: Eliquis last dose 03/03/23 at 6 AM Aspirin Instructions: Last Dose:  Activity level:  Able to exercise without chest pain and/or shortness of breath      Anesthesia review: history of persistent atrial fibrillation, chronic diastolic heart failure, cardiomyopathy, HTN, Heart mumur, DM, History of Stroke,   Patient denies shortness of breath, fever, cough and chest pain during pre op phone call.   Patient verbalized understanding of instructions reviewed via telephone.

## 2023-03-04 ENCOUNTER — Encounter (HOSPITAL_COMMUNITY): Payer: Self-pay | Admitting: Orthopedic Surgery

## 2023-03-04 ENCOUNTER — Encounter (HOSPITAL_COMMUNITY)
Admission: RE | Disposition: A | Discharge status: Home / Self Care | Payer: Self-pay | Source: Home / Self Care | Attending: Orthopedic Surgery

## 2023-03-04 ENCOUNTER — Ambulatory Visit (HOSPITAL_COMMUNITY)
Admission: RE | Admit: 2023-03-04 | Discharge: 2023-03-04 | Disposition: A | Discharge status: Home / Self Care | Payer: 59 | Attending: Orthopedic Surgery | Admitting: Orthopedic Surgery

## 2023-03-04 ENCOUNTER — Ambulatory Visit (HOSPITAL_COMMUNITY): Payer: 59 | Admitting: Physician Assistant

## 2023-03-04 ENCOUNTER — Other Ambulatory Visit: Payer: Self-pay

## 2023-03-04 ENCOUNTER — Ambulatory Visit (HOSPITAL_BASED_OUTPATIENT_CLINIC_OR_DEPARTMENT_OTHER): Payer: 59 | Admitting: Physician Assistant

## 2023-03-04 DIAGNOSIS — E785 Hyperlipidemia, unspecified: Secondary | ICD-10-CM | POA: Diagnosis not present

## 2023-03-04 DIAGNOSIS — K219 Gastro-esophageal reflux disease without esophagitis: Secondary | ICD-10-CM | POA: Diagnosis not present

## 2023-03-04 DIAGNOSIS — T8131XA Disruption of external operation (surgical) wound, not elsewhere classified, initial encounter: Secondary | ICD-10-CM | POA: Diagnosis not present

## 2023-03-04 DIAGNOSIS — I700 Atherosclerosis of aorta: Secondary | ICD-10-CM | POA: Insufficient documentation

## 2023-03-04 DIAGNOSIS — I509 Heart failure, unspecified: Secondary | ICD-10-CM | POA: Insufficient documentation

## 2023-03-04 DIAGNOSIS — I10 Essential (primary) hypertension: Secondary | ICD-10-CM | POA: Diagnosis not present

## 2023-03-04 DIAGNOSIS — Z87891 Personal history of nicotine dependence: Secondary | ICD-10-CM | POA: Diagnosis not present

## 2023-03-04 DIAGNOSIS — Z96652 Presence of left artificial knee joint: Secondary | ICD-10-CM | POA: Diagnosis not present

## 2023-03-04 DIAGNOSIS — I110 Hypertensive heart disease with heart failure: Secondary | ICD-10-CM | POA: Insufficient documentation

## 2023-03-04 DIAGNOSIS — Z7901 Long term (current) use of anticoagulants: Secondary | ICD-10-CM | POA: Insufficient documentation

## 2023-03-04 DIAGNOSIS — Z7984 Long term (current) use of oral hypoglycemic drugs: Secondary | ICD-10-CM | POA: Diagnosis not present

## 2023-03-04 DIAGNOSIS — I4819 Other persistent atrial fibrillation: Secondary | ICD-10-CM | POA: Diagnosis not present

## 2023-03-04 DIAGNOSIS — Z8673 Personal history of transient ischemic attack (TIA), and cerebral infarction without residual deficits: Secondary | ICD-10-CM

## 2023-03-04 DIAGNOSIS — Y838 Other surgical procedures as the cause of abnormal reaction of the patient, or of later complication, without mention of misadventure at the time of the procedure: Secondary | ICD-10-CM | POA: Diagnosis not present

## 2023-03-04 DIAGNOSIS — E119 Type 2 diabetes mellitus without complications: Secondary | ICD-10-CM | POA: Diagnosis not present

## 2023-03-04 DIAGNOSIS — M329 Systemic lupus erythematosus, unspecified: Secondary | ICD-10-CM | POA: Diagnosis not present

## 2023-03-04 DIAGNOSIS — Z7985 Long-term (current) use of injectable non-insulin antidiabetic drugs: Secondary | ICD-10-CM | POA: Insufficient documentation

## 2023-03-04 DIAGNOSIS — D649 Anemia, unspecified: Secondary | ICD-10-CM

## 2023-03-04 DIAGNOSIS — T8130XA Disruption of wound, unspecified, initial encounter: Secondary | ICD-10-CM | POA: Diagnosis not present

## 2023-03-04 HISTORY — DX: Gastro-esophageal reflux disease without esophagitis: K21.9

## 2023-03-04 HISTORY — DX: Heart failure, unspecified: I50.9

## 2023-03-04 HISTORY — DX: Anemia, unspecified: D64.9

## 2023-03-04 HISTORY — PX: IRRIGATION AND DEBRIDEMENT KNEE: SHX5185

## 2023-03-04 HISTORY — DX: Unspecified atrial fibrillation: I48.91

## 2023-03-04 LAB — CBC
HCT: 35.3 % — ABNORMAL LOW (ref 36.0–46.0)
Hemoglobin: 11 g/dL — ABNORMAL LOW (ref 12.0–15.0)
MCH: 25.2 pg — ABNORMAL LOW (ref 26.0–34.0)
MCHC: 31.2 g/dL (ref 30.0–36.0)
MCV: 81 fL (ref 80.0–100.0)
Platelets: 264 10*3/uL (ref 150–400)
RBC: 4.36 MIL/uL (ref 3.87–5.11)
RDW: 15 % (ref 11.5–15.5)
WBC: 4.8 10*3/uL (ref 4.0–10.5)
nRBC: 0 % (ref 0.0–0.2)

## 2023-03-04 LAB — GLUCOSE, CAPILLARY
Glucose-Capillary: 65 mg/dL — ABNORMAL LOW (ref 70–99)
Glucose-Capillary: 96 mg/dL (ref 70–99)
Glucose-Capillary: 71 mg/dL (ref 70–99)

## 2023-03-04 LAB — BASIC METABOLIC PANEL
Anion gap: 8 (ref 5–15)
BUN: 12 mg/dL (ref 8–23)
CO2: 27 mmol/L (ref 22–32)
Calcium: 9 mg/dL (ref 8.9–10.3)
Chloride: 105 mmol/L (ref 98–111)
Creatinine, Ser: 0.77 mg/dL (ref 0.44–1.00)
GFR, Estimated: >60 mL/min (ref >60)
Glucose, Bld: 89 mg/dL (ref 70–99)
Potassium: 3.9 mmol/L (ref 3.5–5.1)
Sodium: 140 mmol/L (ref 135–145)

## 2023-03-04 SURGERY — IRRIGATION AND DEBRIDEMENT KNEE
Anesthesia: General | Site: Knee | Laterality: Left

## 2023-03-04 MED ORDER — LACTATED RINGERS IV BOLUS
500.0000 mL | Freq: Once | INTRAVENOUS | Status: AC
  Administered 2023-03-04: 500 mL via INTRAVENOUS

## 2023-03-04 MED ORDER — VANCOMYCIN HCL IN DEXTROSE 1-5 GM/200ML-% IV SOLN
1000.0000 mg | Freq: Once | INTRAVENOUS | Status: AC
  Administered 2023-03-04: 1000 mg via INTRAVENOUS
  Filled 2023-03-04: qty 200

## 2023-03-04 MED ORDER — ONDANSETRON HCL 4 MG/2ML IJ SOLN: 4.0000 mg | Freq: Once | INTRAMUSCULAR | Status: DC | PRN

## 2023-03-04 MED ORDER — PROPOFOL 10 MG/ML IV BOLUS
INTRAVENOUS | Status: AC
  Filled 2023-03-04: qty 20

## 2023-03-04 MED ORDER — GLYCOPYRROLATE 0.2 MG/ML IJ SOLN
INTRAMUSCULAR | Status: AC
  Filled 2023-03-04: qty 1

## 2023-03-04 MED ORDER — ONDANSETRON HCL 4 MG/2ML IJ SOLN
INTRAMUSCULAR | Status: DC | PRN
  Administered 2023-03-04: 4 mg via INTRAVENOUS

## 2023-03-04 MED ORDER — HYDROCODONE-ACETAMINOPHEN 5-325 MG PO TABS
1.0000 | ORAL_TABLET | Freq: Once | ORAL | Status: AC
  Administered 2023-03-04: 1 via ORAL

## 2023-03-04 MED ORDER — POVIDONE-IODINE 10 % EX SWAB: 2.0000 | Freq: Once | CUTANEOUS | Status: DC

## 2023-03-04 MED ORDER — ORAL CARE MOUTH RINSE: 15.0000 mL | Freq: Once | OROMUCOSAL | Status: AC

## 2023-03-04 MED ORDER — KETOROLAC TROMETHAMINE 30 MG/ML IJ SOLN
30.0000 mg | Freq: Once | INTRAMUSCULAR | Status: AC | PRN
  Administered 2023-03-04: 30 mg via INTRAVENOUS

## 2023-03-04 MED ORDER — FENTANYL CITRATE (PF) 100 MCG/2ML IJ SOLN
INTRAMUSCULAR | Status: AC
  Filled 2023-03-04: qty 2

## 2023-03-04 MED ORDER — LIDOCAINE HCL (CARDIAC) PF 100 MG/5ML IV SOSY
PREFILLED_SYRINGE | INTRAVENOUS | Status: DC | PRN
  Administered 2023-03-04: 100 mg via INTRAVENOUS

## 2023-03-04 MED ORDER — ONDANSETRON HCL 4 MG/2ML IJ SOLN
INTRAMUSCULAR | Status: AC
  Filled 2023-03-04: qty 4

## 2023-03-04 MED ORDER — FENTANYL CITRATE PF 50 MCG/ML IJ SOSY
25.0000 ug | PREFILLED_SYRINGE | INTRAMUSCULAR | Status: DC | PRN
  Administered 2023-03-04 (×3): 50 ug via INTRAVENOUS

## 2023-03-04 MED ORDER — HYDROCODONE-ACETAMINOPHEN 5-325 MG PO TABS
ORAL_TABLET | ORAL | Status: AC
  Filled 2023-03-04: qty 1

## 2023-03-04 MED ORDER — LACTATED RINGERS IV SOLN: INTRAVENOUS | Status: DC

## 2023-03-04 MED ORDER — SODIUM CHLORIDE 0.9 % IR SOLN
Status: DC | PRN
  Administered 2023-03-04: 3000 mL
  Administered 2023-03-04: 1000 mL

## 2023-03-04 MED ORDER — DEXMEDETOMIDINE HCL IN NACL 80 MCG/20ML IV SOLN
INTRAVENOUS | Status: DC | PRN
  Administered 2023-03-04 (×4): 4 ug via INTRAVENOUS

## 2023-03-04 MED ORDER — MIDAZOLAM HCL 5 MG/5ML IJ SOLN
INTRAMUSCULAR | Status: DC | PRN
  Administered 2023-03-04: 2 mg via INTRAVENOUS

## 2023-03-04 MED ORDER — FENTANYL CITRATE PF 50 MCG/ML IJ SOSY
PREFILLED_SYRINGE | INTRAMUSCULAR | Status: AC
  Filled 2023-03-04: qty 1

## 2023-03-04 MED ORDER — ACETAMINOPHEN 500 MG PO TABS
1000.0000 mg | ORAL_TABLET | Freq: Once | ORAL | Status: AC
  Administered 2023-03-04: 1000 mg via ORAL
  Filled 2023-03-04: qty 2

## 2023-03-04 MED ORDER — DEXMEDETOMIDINE HCL IN NACL 80 MCG/20ML IV SOLN
INTRAVENOUS | Status: AC
  Filled 2023-03-04: qty 20

## 2023-03-04 MED ORDER — DEXAMETHASONE SODIUM PHOSPHATE 10 MG/ML IJ SOLN
INTRAMUSCULAR | Status: AC
  Filled 2023-03-04: qty 1

## 2023-03-04 MED ORDER — PROPOFOL 10 MG/ML IV BOLUS
INTRAVENOUS | Status: DC | PRN
  Administered 2023-03-04: 170 mg via INTRAVENOUS

## 2023-03-04 MED ORDER — FENTANYL CITRATE (PF) 100 MCG/2ML IJ SOLN
INTRAMUSCULAR | Status: DC | PRN
  Administered 2023-03-04 (×2): 25 ug via INTRAVENOUS
  Administered 2023-03-04: 50 ug via INTRAVENOUS

## 2023-03-04 MED ORDER — TRANEXAMIC ACID-NACL 1000-0.7 MG/100ML-% IV SOLN
1000.0000 mg | INTRAVENOUS | Status: AC
  Administered 2023-03-04: 1000 mg via INTRAVENOUS
  Filled 2023-03-04: qty 100

## 2023-03-04 MED ORDER — MIDAZOLAM HCL 2 MG/2ML IJ SOLN
INTRAMUSCULAR | Status: AC
  Filled 2023-03-04: qty 2

## 2023-03-04 MED ORDER — INSULIN ASPART 100 UNIT/ML IJ SOLN: 0.0000 [IU] | INTRAMUSCULAR | Status: DC | PRN

## 2023-03-04 MED ORDER — DEXTROSE 50 % IV SOLN
0.5000 | Freq: Once | INTRAVENOUS | Status: AC
  Administered 2023-03-04: 25 mL via INTRAVENOUS
  Filled 2023-03-04: qty 50

## 2023-03-04 MED ORDER — APIXABAN 5 MG PO TABS
5.0000 mg | ORAL_TABLET | Freq: Two times a day (BID) | ORAL | 1 refills | Status: DC
Start: 2023-03-05 — End: 2023-12-22

## 2023-03-04 MED ORDER — KETOROLAC TROMETHAMINE 30 MG/ML IJ SOLN
INTRAMUSCULAR | Status: AC
  Filled 2023-03-04: qty 1

## 2023-03-04 MED ORDER — CEFAZOLIN SODIUM-DEXTROSE 2-4 GM/100ML-% IV SOLN
2.0000 g | INTRAVENOUS | Status: AC
  Administered 2023-03-04: 2 g via INTRAVENOUS
  Filled 2023-03-04: qty 100

## 2023-03-04 MED ORDER — CHLORHEXIDINE GLUCONATE 0.12 % MT SOLN
15.0000 mL | Freq: Once | OROMUCOSAL | Status: AC
  Administered 2023-03-04: 15 mL via OROMUCOSAL

## 2023-03-04 MED ORDER — METOPROLOL SUCCINATE ER 25 MG PO TB24
50.0000 mg | ORAL_TABLET | Freq: Every day | ORAL | Status: DC
  Administered 2023-03-04: 50 mg via ORAL
  Filled 2023-03-04: qty 2

## 2023-03-04 MED ORDER — LACTATED RINGERS IV BOLUS: 250.0000 mL | Freq: Once | INTRAVENOUS | Status: DC

## 2023-03-04 SURGICAL SUPPLY — 51 items
ADH SKN CLS APL DERMABOND .7 (GAUZE/BANDAGES/DRESSINGS)
APL PRP STRL LF DISP 70% ISPRP (MISCELLANEOUS) ×1
BAG COUNTER SPONGE SURGICOUNT (BAG) IMPLANT
BAG SPEC THK2 15X12 ZIP CLS (MISCELLANEOUS)
BAG SPNG CNTER NS LX DISP (BAG)
BAG ZIPLOCK 12X15 (MISCELLANEOUS) ×1 IMPLANT
BANDAGE ESMARK 6X9 LF (GAUZE/BANDAGES/DRESSINGS) ×1 IMPLANT
BNDG CMPR 6 X 5 YARDS HK CLSR (GAUZE/BANDAGES/DRESSINGS) ×1
BNDG CMPR 9X6 STRL LF SNTH (GAUZE/BANDAGES/DRESSINGS)
BNDG ELASTIC 6INX 5YD STR LF (GAUZE/BANDAGES/DRESSINGS) IMPLANT
BNDG ESMARK 6X9 LF (GAUZE/BANDAGES/DRESSINGS)
CHLORAPREP W/TINT 26 (MISCELLANEOUS) ×1 IMPLANT
COVER SURGICAL LIGHT HANDLE (MISCELLANEOUS) ×1 IMPLANT
CUFF TOURN SGL QUICK 34 (TOURNIQUET CUFF)
CUFF TRNQT CYL 34X4.125X (TOURNIQUET CUFF) ×1 IMPLANT
DERMABOND ADVANCED .7 DNX12 (GAUZE/BANDAGES/DRESSINGS) ×1 IMPLANT
DRAPE SHEET LG 3/4 BI-LAMINATE (DRAPES) ×3 IMPLANT
DRESSING PREVENA PLUS CUSTOM (GAUZE/BANDAGES/DRESSINGS) IMPLANT
DRSG AQUACEL AG ADV 3.5X10 (GAUZE/BANDAGES/DRESSINGS) ×1 IMPLANT
DRSG PREVENA PLUS CUSTOM (GAUZE/BANDAGES/DRESSINGS) ×1
ELECT REM PT RETURN 15FT ADLT (MISCELLANEOUS) ×1 IMPLANT
EVACUATOR 1/8 PVC DRAIN (DRAIN) IMPLANT
GAUZE SPONGE 4X4 12PLY STRL (GAUZE/BANDAGES/DRESSINGS) ×1 IMPLANT
GLOVE BIO SURGEON STRL SZ8.5 (GLOVE) ×2 IMPLANT
GLOVE BIOGEL M 7.0 STRL (GLOVE) ×1 IMPLANT
GLOVE BIOGEL PI IND STRL 7.5 (GLOVE) ×1 IMPLANT
GLOVE BIOGEL PI IND STRL 8 (GLOVE) ×1 IMPLANT
GLOVE BIOGEL PI IND STRL 8.5 (GLOVE) ×1 IMPLANT
GLOVE SURG LX STRL 7.5 STRW (GLOVE) ×2 IMPLANT
GOWN SPEC L3 XXLG W/TWL (GOWN DISPOSABLE) ×1 IMPLANT
HANDPIECE INTERPULSE COAX TIP (DISPOSABLE)
IMMOBILIZER KNEE 20 (SOFTGOODS) ×1 IMPLANT
IMMOBILIZER KNEE 20 THIGH 36 (SOFTGOODS) IMPLANT
KIT BASIN OR (CUSTOM PROCEDURE TRAY) ×1 IMPLANT
KIT DRSG PREVENA PLUS 7DAY 125 (MISCELLANEOUS) IMPLANT
KIT TURNOVER KIT A (KITS) IMPLANT
MANIFOLD NEPTUNE II (INSTRUMENTS) ×1 IMPLANT
PACK TOTAL JOINT (CUSTOM PROCEDURE TRAY) ×1 IMPLANT
PADDING CAST COTTON 6X4 STRL (CAST SUPPLIES) ×1 IMPLANT
PROTECTOR NERVE ULNAR (MISCELLANEOUS) ×1 IMPLANT
SET HNDPC FAN SPRY TIP SCT (DISPOSABLE) ×1 IMPLANT
SOLUTION PRONTOSAN WOUND 350ML (IRRIGATION / IRRIGATOR) ×1 IMPLANT
STAPLER VISISTAT 35W (STAPLE) IMPLANT
SUT ETHILON 2 0 PSLX (SUTURE) IMPLANT
SUT MNCRL AB 4-0 PS2 18 (SUTURE) ×1 IMPLANT
SUT VIC AB 1 CT1 36 (SUTURE) ×2 IMPLANT
SUT VIC AB 2-0 CT1 27 (SUTURE) ×2
SUT VIC AB 2-0 CT1 TAPERPNT 27 (SUTURE) ×3 IMPLANT
SWAB COLLECTION DEVICE MRSA (MISCELLANEOUS) ×1 IMPLANT
SWAB CULTURE ESWAB REG 1ML (MISCELLANEOUS) ×1 IMPLANT
TOWEL OR 17X26 10 PK STRL BLUE (TOWEL DISPOSABLE) ×2 IMPLANT

## 2023-03-04 NOTE — Anesthesia Preprocedure Evaluation (Signed)
Anesthesia Evaluation  Patient identified by MRN, date of birth, ID band Patient awake    Reviewed: Allergy & Precautions, H&P , NPO status , Patient's Chart, lab work & pertinent test results  Airway Mallampati: II  TM Distance: >3 FB Neck ROM: Full    Dental no notable dental hx.    Pulmonary neg pulmonary ROS, former smoker   Pulmonary exam normal breath sounds clear to auscultation       Cardiovascular hypertension, Pt. on medications Normal cardiovascular exam+ dysrhythmias Atrial Fibrillation  Rhythm:Regular Rate:Normal  Left Ventricle: Left ventricular ejection fraction, by estimation, is 60  to 65%. The left ventricle has normal function. The left ventricle has no  regional wall motion abnormalities. The left ventricular internal cavity  size was normal in size. There is   no left ventricular hypertrophy. Left ventricular diastolic parameters  are consistent with Grade II diastolic dysfunction (pseudonormalization).  Indeterminate filling pressures.   Right Ventricle: The right ventricular size is normal. No increase in  right ventricular wall thickness. Right ventricular systolic function is  normal. There is normal pulmonary artery systolic pressure. The tricuspid  regurgitant velocity is 2.52 m/s, and   with an assumed right atrial pressure of 3 mmHg, the estimated right  ventricular systolic pressure is 28.4 mmHg.   Left Atrium: Left atrial size was moderately dilated.   Right Atrium: Right atrial size was normal in size.   Pericardium: There is no evidence of pericardial effusion.   Mitral Valve: The mitral valve is normal in structure. Trivial mitral  valve regurgitation. No evidence of mitral valve stenosis.   Tricuspid Valve: The tricuspid valve is normal in structure. Tricuspid  valve regurgitation is mild . No evidence of tricuspid stenosis.   Aortic Valve: The aortic valve is tricuspid. Aortic valve  regurgitation is  not visualized. No aortic stenosis is present.      Neuro/Psych CVA  negative psych ROS   GI/Hepatic Neg liver ROS,GERD  Medicated,,  Endo/Other  negative endocrine ROSdiabetes    Renal/GU negative Renal ROS  negative genitourinary   Musculoskeletal negative musculoskeletal ROS (+)    Abdominal   Peds negative pediatric ROS (+)  Hematology negative hematology ROS (+)   Anesthesia Other Findings   Reproductive/Obstetrics negative OB ROS                             Anesthesia Physical Anesthesia Plan  ASA: 3  Anesthesia Plan: General   Post-op Pain Management: Tylenol PO (pre-op)*   Induction: Intravenous  PONV Risk Score and Plan: 3 and Ondansetron, Dexamethasone and Treatment may vary due to age or medical condition  Airway Management Planned: LMA  Additional Equipment:   Intra-op Plan:   Post-operative Plan: Extubation in OR  Informed Consent: I have reviewed the patients History and Physical, chart, labs and discussed the procedure including the risks, benefits and alternatives for the proposed anesthesia with the patient or authorized representative who has indicated his/her understanding and acceptance.     Dental advisory given  Plan Discussed with: CRNA and Surgeon  Anesthesia Plan Comments:        Anesthesia Quick Evaluation

## 2023-03-04 NOTE — Op Note (Signed)
OPERATIVE REPORT   03/04/2023  4:23 PM  PATIENT:  Amanda Saunders   SURGEON:  Jonette Pesa, MD  ASSISTANT:  Clint Bolder, PA-C.   PREOPERATIVE DIAGNOSIS:  left knee wound dehiscence  POSTOPERATIVE DIAGNOSIS:  Same.  PROCEDURE:  Excisional debridement skin and subcutaneous tissue, left knee. Closure of left knee wound measuring 5 cm. Application of negative pressure incisional dressing.  ANESTHESIA:   GETA.  ANTIBIOTICS:  2g Ancef. 1g Vancomycin.  IMPLANTS: None.  TUBES AND DRAINS: Customizable Prevena negative pressure dressing at 75 mmHg.  SPECIMENS: Superficial wound swab left knee for aerobic and anaerobic culture.  COMPLICATIONS: None.  DISPOSITION: Stable to PACU.  SURGICAL INDICATIONS:  Amanda Saunders is a 67 y.o. female who underwent primary left total knee arthroplasty on 12/17/2022.  She developed an area of delayed wound healing.  She was indicated for debridement and closure.  We discussed the risk, benefits, alternatives, and she elected to proceed.  PROCEDURE IN DETAIL: The patient was identified in holding area using 2 identifiers.  The surgical site was marked by myself.  She was taken the operating room, placed supine on the operating room table.  All bony prominences were well-padded.  A bump was placed under the left hip.  A tourniquet was not utilized.  The left lower extremity was prepped and draped in the normal sterile surgical fashion.  Timeout was called, verifying site and site of surgery.  The patient did receive IV antibiotics within 60 minutes of beginning the procedure.  I began by examining the left knee.  In the center of the incision, she had a nonhealing region measuring 13 x 8 mm.  No underlying fluid collection.  No necrotic tissue.  No fluctuance.  Using a #10 blade, I sharply ellipsed out this area, including skin and subcutaneous tissue.  The underlying subcutaneous tissue was intact.  There is no purulence or fluid  collection.  I swabbed the bed of the wound for routine aerobic and anaerobic culture.  Using curved Mayo scissors, I created full-thickness skin flaps.  The wound was then irrigated with normal saline using pulsatile lavage.  The wound was closed with a combination of 2-0 nylon vertical mattress sutures and skin staples.  A customizable Prevena negative pressure dressing was applied and hooked up to suction at 75 mmHg.  There was excellent seal without any leak.  A bulky dressing was placed followed by a knee immobilizer.  She was awakened from anesthesia and transferred to the PACU in stable condition.  Sponge, needle, and instrument counts were correct at the end of the case x 2.  There were no known complications.  POSTOPERATIVE PLAN: Postoperatively, the patient can be discharged home from the PACU.  She may weight-bear as tolerated in the knee immobilizer.  She is to maintain full extension of the knee for 2 weeks.  We will discharge her on p.o. Keflex.  Follow-up in the office within 7 days for removal of her negative pressure dressing.

## 2023-03-04 NOTE — Interval H&P Note (Signed)
History and Physical Interval Note:  03/04/2023 2:25 PM  Amanda Saunders  has presented today for surgery, with the diagnosis of left knee wound dehiscence.  The various methods of treatment have been discussed with the patient and family. After consideration of risks, benefits and other options for treatment, the patient has consented to  Procedure(s): Left knee irrigation and debridement, wound closure (Left) as a surgical intervention.  The patient's history has been reviewed, patient examined, no change in status, stable for surgery.  I have reviewed the patient's chart and labs.  Questions were answered to the patient's satisfaction.     [name] 

## 2023-03-04 NOTE — Discharge Instructions (Signed)
Weight-bearing as tolerated on left lower extremity with walker.  Maintain knee immobilizer.  Please charge your prevena negative pressure dressing daily.  Keep dressing clean and dry.  Do not get dressing wet.  Please take cephalexin as directed.  Follow-up in the office within 7 days for removal of negative pressure dressing.

## 2023-03-04 NOTE — Anesthesia Procedure Notes (Signed)
Procedure Name: LMA Insertion Date/Time: 03/04/2023 3:36 PM  Performed by: Theodosia Quay, CRNAPre-anesthesia Checklist: Patient identified, Emergency Drugs available, Suction available, Patient being monitored and Timeout performed Patient Re-evaluated:Patient Re-evaluated prior to induction Oxygen Delivery Method: Circle system utilized Preoxygenation: Pre-oxygenation with 100% oxygen Induction Type: IV induction Ventilation: Mask ventilation without difficulty LMA: LMA inserted LMA Size: 4.0 Tube size: 4.0 mm Number of attempts: 1 Placement Confirmation: positive ETCO2 and breath sounds checked- equal and bilateral Tube secured with: Tape Dental Injury: Teeth and Oropharynx as per pre-operative assessment

## 2023-03-04 NOTE — Transfer of Care (Signed)
Immediate Anesthesia Transfer of Care Note  Patient: Amanda Saunders  Procedure(s) Performed: Procedure(s): Left knee irrigation and debridement, wound closure (Left)  Patient Location: PACU  Anesthesia Type:General  Level of Consciousness:  sedated, patient cooperative and responds to stimulation  Airway & Oxygen Therapy:Patient Spontanous Breathing and Patient connected to face mask oxgen  Post-op Assessment:  Report given to PACU RN and Post -op Vital signs reviewed and stable  Post vital signs:  Reviewed and stable  Last Vitals:  Vitals:   03/04/23 1256  BP: 134/75  Pulse: 70  Resp: 18  Temp: 36.6 C  SpO2: 100%    Complications: No apparent anesthesia complications

## 2023-03-05 ENCOUNTER — Encounter (HOSPITAL_COMMUNITY): Payer: Self-pay | Admitting: Orthopedic Surgery

## 2023-03-05 NOTE — Anesthesia Postprocedure Evaluation (Signed)
Anesthesia Post Note  Patient: Amanda Saunders  Procedure(s) Performed: Left knee irrigation and debridement, wound closure (Left: Knee)     Patient location during evaluation: PACU Anesthesia Type: General Level of consciousness: awake and alert Pain management: pain level controlled Vital Signs Assessment: post-procedure vital signs reviewed and stable Respiratory status: spontaneous breathing, nonlabored ventilation, respiratory function stable and patient connected to nasal cannula oxygen Cardiovascular status: blood pressure returned to baseline and stable Postop Assessment: no apparent nausea or vomiting Anesthetic complications: no   No notable events documented.  Last Vitals:  Vitals:   03/04/23 1834 03/04/23 1845  BP: (!) 150/78 (!) 154/71  Pulse: 63 (!) 59  Resp: 12   Temp: 36.5 C   SpO2: 100% 98%    Last Pain:  Vitals:   03/04/23 1815  TempSrc:   PainSc: Asleep                 Shelton Silvas

## 2023-03-09 LAB — AEROBIC/ANAEROBIC CULTURE W GRAM STAIN (SURGICAL/DEEP WOUND)
Culture: NO GROWTH
Gram Stain: NONE SEEN

## 2023-03-10 ENCOUNTER — Ambulatory Visit: Payer: 59 | Admitting: Gastroenterology

## 2023-03-26 ENCOUNTER — Other Ambulatory Visit: Payer: Self-pay | Admitting: Family Medicine

## 2023-03-26 ENCOUNTER — Other Ambulatory Visit: Payer: Self-pay | Admitting: Cardiology

## 2023-03-26 DIAGNOSIS — E119 Type 2 diabetes mellitus without complications: Secondary | ICD-10-CM

## 2023-03-26 DIAGNOSIS — I428 Other cardiomyopathies: Secondary | ICD-10-CM

## 2023-03-26 DIAGNOSIS — R1013 Epigastric pain: Secondary | ICD-10-CM

## 2023-03-29 NOTE — Progress Notes (Deleted)
[facility] at [facility] [address] [place], [name] [address] [phone]  Date:  04/01/2023   Name:  Amanda Saunders   DOB:  08/10/1955   MRN:  [id number]  PCP:  Pearline Cables, MD    Chief Complaint: No chief complaint on file.   History of Present Illness:  Amanda Saunders is a 67 y.o. very pleasant female patient who presents with the following:  Patient seen today with concern of nausea Most recent visit with myself was in March of this year for preoperative visit, she planned to have a left total knee which was completed in May  History of lupus, rheumatoid arthritis, cardiomyopathy, hypertension, diabetes, stroke January 2020  Dr Everlena Cooper is her neurologist Dr Dierdre Forth rheumatology-  [facility] done 2 days ago; she gets this every 30 days.  Patient notes her are symptoms are stable  Lab Results  Component Value Date   HGBA1C 5.9 (H) 12/04/2022   Eye exam Foot exams daily Shingrix due Recommend COVID booster Flu shot Bone density is up-to-date Mammogram can be updated  Eliquis Lasix 20 mg daily, extra tablet as needed Gabapentin 800 twice daily Plaquenil Losartan Metformin 500 daily Toprol-XL 50 Crestor 20 Ozempic 2 mg weekly   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   Systemic lupus erythematosus (HCC) 08/04/2019   Acute ischemic stroke (HCC) 08/14/2018   HTN (hypertension) 08/14/2018   Controlled type 2 diabetes mellitus without complication, without long-term current use of insulin (HCC) 04/03/2016   History of CVA (cerebrovascular accident) 03/12/2016   Chest pain at rest  03/12/2016   Peripheral edema 03/12/2016    Past Medical History:  Diagnosis Date   Anemia    Aortic atherosclerosis (HCC)    Arthritis    Atrial fibrillation (HCC)    Cardiomyopathy (HCC)    CHF (congestive heart failure) (HCC)    Diabetes mellitus without complication (HCC)    Dyspnea    Dysrhythmia    GERD (gastroesophageal reflux disease)    Headache    Heart murmur    at birth   Hypertension    Internal hemorrhoids    Lupus (HCC)    PUD (peptic ulcer disease)    Stroke (HCC) 08/13/2018   Vertigo     Past Surgical History:  Procedure Laterality Date   BACK SURGERY     CARDIOVERSION N/A 07/03/2022   Procedure: CARDIOVERSION;  Surgeon: Little Ishikawa, MD;  Location: [facility] ENDOSCOPY;  Service: Cardiovascular;  Laterality: N/A;   CARPAL TUNNEL RELEASE Bilateral    COLONOSCOPY     IRRIGATION AND DEBRIDEMENT KNEE Left 03/04/2023   Procedure: Left knee irrigation and debridement, wound closure;  Surgeon: Samson Frederic, MD;  Location: [facility] ORS;  Service: Orthopedics;  Laterality: Left;   KNEE ARTHROPLASTY Left 12/17/2022   Procedure: COMPUTER ASSISTED TOTAL KNEE ARTHROPLASTY;  Surgeon: Samson Frederic, MD;  Location: [facility] ORS;  Service: Orthopedics;  Laterality: Left;   KNEE CARTILAGE SURGERY Left    MENISECTOMY Left    NECK SURGERY     2015   UPPER GASTROINTESTINAL ENDOSCOPY      Social History   Tobacco Use  Smoking status: Former    Current packs/day: 0.00    Types: Cigarettes    Quit date: 07/21/2002    Years since quitting: 20.7   Smokeless tobacco: Never  Vaping Use   Vaping status: Never Used  Substance Use Topics   Alcohol use: Not Currently    Comment: occasional   Drug use: No    Family History  Problem Relation Age of Onset   Hypertension Mother    Arthritis Mother    Heart failure Mother    Stroke Mother    Kidney disease Father    Hypertension Father    Colon cancer Neg Hx    Esophageal cancer Neg Hx    Rectal cancer Neg Hx     Stomach cancer Neg Hx     Allergies  Allergen Reactions   Pholcodine Nausea Only   Codeine Nausea And Vomiting    ALL FORMS   Oxycodone Nausea And Vomiting    Medication list has been reviewed and updated.  Current Outpatient Medications on File Prior to Visit  Medication Sig Dispense Refill   acetaminophen (TYLENOL) 650 MG CR tablet Take 1,300 mg by mouth every 8 (eight) hours as needed for pain.     apixaban (ELIQUIS) 5 MG TABS tablet Take 1 tablet (5 mg total) by mouth 2 (two) times daily. 180 tablet 1   cephALEXin (KEFLEX) 500 MG capsule Take 500 mg by mouth 2 (two) times daily.     Cholecalciferol (VITAMIN D-3) 125 MCG (5000 UT) TABS Take 5,000 Units by mouth daily.     CINNAMON PO Take 500 mg by mouth daily.     furosemide (LASIX) 20 MG tablet Take 1 tablet once daily and may take an extra tablet as needed for shortness of breath or weight gain 180 tablet 3   gabapentin (NEURONTIN) 800 MG tablet TAKE ONE TABLET BY MOUTH every morning and TAKE ONE TABLET BY MOUTH everyday AT bedtime 60 tablet 10   Ginger, Zingiber officinalis, (GINGER PO) Take 550 mg by mouth daily.     hydroxychloroquine (PLAQUENIL) 200 MG tablet Take 200 mg by mouth 2 (two) times daily.     losartan (COZAAR) 50 MG tablet TAKE 1 TABLET BY MOUTH EVERY MORNING 30 tablet 10   Magnesium 250 MG TABS Take 250 mg by mouth daily.     metFORMIN (GLUCOPHAGE) 500 MG tablet TAKE 1 TABLET BY MOUTH EVERY DAY AT BEDTIME 30 tablet 10   metoprolol succinate (TOPROL-XL) 50 MG 24 hr tablet Take 1 tablet (50 mg total) by mouth daily. Take with or immediately following a meal. 90 tablet 3   Omega 3-6-9 Fatty Acids (TRIPLE OMEGA COMPLEX PO) Take 1 capsule by mouth every other day.     omeprazole (PRILOSEC) 40 MG capsule TAKE 1 CAPSULE BY MOUTH ONCE DAILY 30 capsule 10   ondansetron (ZOFRAN) 4 MG tablet Take 1 tablet (4 mg total) by mouth every 8 (eight) hours as needed for nausea or vomiting. 30 tablet 0   rosuvastatin (CRESTOR) 20 MG  tablet TAKE ONE TABLET BY MOUTH EVERYDAY AT BEDTIME 90 tablet 3   Semaglutide, 2 MG/DOSE, 8 MG/3ML SOPN Inject 2 mg as directed once a week. 9 mL 3   sucralfate (CARAFATE) 1 g tablet Take 1 tablet (1 g total) by mouth 4 (four) times daily -  with meals and at bedtime. As needed 40 tablet 1   traMADol (ULTRAM) 50 MG tablet Take 1 tablet (50 mg total) by mouth every 6 (six)  hours as needed for moderate pain. 30 tablet 0   TURMERIC PO Take 500 mg by mouth daily.     Vitamin A 2400 MCG (8000 UT) CAPS Take 2,400 Units by mouth daily.     No current facility-administered medications on file prior to visit.    Review of Systems:  As per HPI- otherwise negative.   Physical Examination: There were no vitals filed for this visit. There were no vitals filed for this visit. There is no height or weight on file to calculate BMI. Ideal Body Weight:    GEN: no acute distress. HEENT: Atraumatic, Normocephalic.  Ears and Nose: No external deformity. CV: RRR, No M/G/R. No JVD. No thrill. No extra heart sounds. PULM: CTA B, no wheezes, crackles, rhonchi. No retractions. No resp. distress. No accessory muscle use. ABD: S, NT, ND, +BS. No rebound. No HSM. EXTR: No c/c/e PSYCH: Normally interactive. Conversant.  Foot exam  Assessment and Plan: ***  Signed Abbe Amsterdam, MD

## 2023-04-01 ENCOUNTER — Ambulatory Visit: Payer: 59 | Admitting: Family Medicine

## 2023-04-01 DIAGNOSIS — I4891 Unspecified atrial fibrillation: Secondary | ICD-10-CM

## 2023-04-01 DIAGNOSIS — I10 Essential (primary) hypertension: Secondary | ICD-10-CM

## 2023-04-01 DIAGNOSIS — M329 Systemic lupus erythematosus, unspecified: Secondary | ICD-10-CM

## 2023-04-12 ENCOUNTER — Encounter: Payer: Self-pay | Admitting: Family Medicine

## 2023-04-13 DIAGNOSIS — Z96652 Presence of left artificial knee joint: Secondary | ICD-10-CM | POA: Diagnosis not present

## 2023-04-15 DIAGNOSIS — Z79899 Other long term (current) drug therapy: Secondary | ICD-10-CM | POA: Diagnosis not present

## 2023-04-15 DIAGNOSIS — M329 Systemic lupus erythematosus, unspecified: Secondary | ICD-10-CM | POA: Diagnosis not present

## 2023-04-22 NOTE — Progress Notes (Deleted)
[facility] [address], [address] [place], [name] [address] [phone]  Date:  04/27/2023   Name:  NANCY GORBET   DOB:  1955/11/05   MRN:  [id number]  PCP:  Pearline Cables, MD    Chief Complaint: No chief complaint on file.   History of Present Illness:  Amanda Saunders is a 67 y.o. very pleasant female patient who presents with the following:  Patient seen today with concern of nausea and lightheadedness Most recent visit with myself was in March of this year-at that time she was planning a knee replacement surgery which was completed in May, it looks like she had to go back to the OR on August 14 for irrigation and debridement  History of lupus, rheumatoid arthritis, cardiomyopathy, hypertension, diabetes, stroke January 2020, atrial fibrillation diagnosed November 2023, CHF, cardiomyopathy, hyperlipidemia Dr Everlena Cooper is her neurologist Dr Dierdre Forth rheumatology She had a cardioversion in [place] do not see where she has followed up with cardiology since that time.  She does have Eliquis as well as metoprolol on her medication list  Eye exam Foot exam COVID booster Flu shot Can update urine microalbumin Recommend Shingrix  Lab Results  Component Value Date   HGBA1C 5.9 (H) 12/04/2022   Eliquis 5 Lasix 20 mg daily Gabapentin Plaquenil 200 mg twice daily Metformin Toprol XL Omega-3 Crestor Ozempic 2 mg  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   Systemic lupus erythematosus (HCC) 08/04/2019   Acute ischemic stroke (HCC) 08/14/2018   HTN (hypertension) 08/14/2018   Controlled type 2  diabetes mellitus without complication, without long-term current use of insulin (HCC) 04/03/2016   History of CVA (cerebrovascular accident) 03/12/2016   Chest pain at rest 03/12/2016   Peripheral edema 03/12/2016    Past Medical History:  Diagnosis Date   Anemia    Aortic atherosclerosis (HCC)    Arthritis    Atrial fibrillation (HCC)    Cardiomyopathy (HCC)    CHF (congestive heart failure) (HCC)    Diabetes mellitus without complication (HCC)    Dyspnea    Dysrhythmia    GERD (gastroesophageal reflux disease)    Headache    Heart murmur    at birth   Hypertension    Internal hemorrhoids    Lupus (HCC)    PUD (peptic ulcer disease)    Stroke (HCC) 08/13/2018   Vertigo     Past Surgical History:  Procedure Laterality Date   BACK SURGERY     CARDIOVERSION N/A 07/03/2022   Procedure: CARDIOVERSION;  Surgeon: Little Ishikawa, MD;  Location: [facility] ENDOSCOPY;  Service: Cardiovascular;  Laterality: N/A;   CARPAL TUNNEL RELEASE Bilateral    COLONOSCOPY     IRRIGATION AND DEBRIDEMENT KNEE Left 03/04/2023   Procedure: Left knee irrigation and debridement, wound closure;  Surgeon: Samson Frederic, MD;  Location: [facility] ORS;  Service: Orthopedics;  Laterality: Left;   KNEE ARTHROPLASTY Left 12/17/2022   Procedure: COMPUTER ASSISTED TOTAL KNEE ARTHROPLASTY;  Surgeon: Samson Frederic, MD;  Location: [facility] ORS;  Service: Orthopedics;  Laterality: Left;   KNEE CARTILAGE SURGERY Left    MENISECTOMY Left    NECK SURGERY  2015   UPPER GASTROINTESTINAL ENDOSCOPY      Social History   Tobacco Use   Smoking status: Former    Current packs/day: 0.00    Types: Cigarettes    Quit date: 07/21/2002    Years since quitting: 20.7   Smokeless tobacco: Never  Vaping Use   Vaping status: Never Used  Substance Use Topics   Alcohol use: Not Currently    Comment: occasional   Drug use: No    Family History  Problem Relation Age of Onset   Hypertension Mother    Arthritis Mother     Heart failure Mother    Stroke Mother    Kidney disease Father    Hypertension Father    Colon cancer Neg Hx    Esophageal cancer Neg Hx    Rectal cancer Neg Hx    Stomach cancer Neg Hx     Allergies  Allergen Reactions   Pholcodine Nausea Only   Codeine Nausea And Vomiting    ALL FORMS   Oxycodone Nausea And Vomiting    Medication list has been reviewed and updated.  Current Outpatient Medications on File Prior to Visit  Medication Sig Dispense Refill   acetaminophen (TYLENOL) 650 MG CR tablet Take 1,300 mg by mouth every 8 (eight) hours as needed for pain.     apixaban (ELIQUIS) 5 MG TABS tablet Take 1 tablet (5 mg total) by mouth 2 (two) times daily. 180 tablet 1   cephALEXin (KEFLEX) 500 MG capsule Take 500 mg by mouth 2 (two) times daily.     Cholecalciferol (VITAMIN D-3) 125 MCG (5000 UT) TABS Take 5,000 Units by mouth daily.     CINNAMON PO Take 500 mg by mouth daily.     furosemide (LASIX) 20 MG tablet Take 1 tablet once daily and may take an extra tablet as needed for shortness of breath or weight gain 180 tablet 3   gabapentin (NEURONTIN) 800 MG tablet TAKE ONE TABLET BY MOUTH every morning and TAKE ONE TABLET BY MOUTH everyday AT bedtime 60 tablet 10   Ginger, Zingiber officinalis, (GINGER PO) Take 550 mg by mouth daily.     hydroxychloroquine (PLAQUENIL) 200 MG tablet Take 200 mg by mouth 2 (two) times daily.     losartan (COZAAR) 50 MG tablet TAKE 1 TABLET BY MOUTH EVERY MORNING 30 tablet 10   Magnesium 250 MG TABS Take 250 mg by mouth daily.     metFORMIN (GLUCOPHAGE) 500 MG tablet TAKE 1 TABLET BY MOUTH EVERY DAY AT BEDTIME 30 tablet 10   metoprolol succinate (TOPROL-XL) 50 MG 24 hr tablet Take 1 tablet (50 mg total) by mouth daily. Take with or immediately following a meal. 90 tablet 3   Omega 3-6-9 Fatty Acids (TRIPLE OMEGA COMPLEX PO) Take 1 capsule by mouth every other day.     omeprazole (PRILOSEC) 40 MG capsule TAKE 1 CAPSULE BY MOUTH ONCE DAILY 30 capsule 10    ondansetron (ZOFRAN) 4 MG tablet Take 1 tablet (4 mg total) by mouth every 8 (eight) hours as needed for nausea or vomiting. 30 tablet 0   rosuvastatin (CRESTOR) 20 MG tablet TAKE ONE TABLET BY MOUTH EVERYDAY AT BEDTIME 90 tablet 3   Semaglutide, 2 MG/DOSE, 8 MG/3ML SOPN Inject 2 mg as directed once a week. 9 mL 3   sucralfate (CARAFATE) 1 g tablet Take 1 tablet (1 g total) by mouth 4 (four) times daily -  with meals and at bedtime. As needed 40 tablet  1   traMADol (ULTRAM) 50 MG tablet Take 1 tablet (50 mg total) by mouth every 6 (six) hours as needed for moderate pain. 30 tablet 0   TURMERIC PO Take 500 mg by mouth daily.     Vitamin A 2400 MCG (8000 UT) CAPS Take 2,400 Units by mouth daily.     No current facility-administered medications on file prior to visit.    Review of Systems:  As per HPI- otherwise negative.   Physical Examination: There were no vitals filed for this visit. There were no vitals filed for this visit. There is no height or weight on file to calculate BMI. Ideal Body Weight:    GEN: no acute distress. HEENT: Atraumatic, Normocephalic.  Ears and Nose: No external deformity. CV: RRR, No M/G/R. No JVD. No thrill. No extra heart sounds. PULM: CTA B, no wheezes, crackles, rhonchi. No retractions. No resp. distress. No accessory muscle use. ABD: S, NT, ND, +BS. No rebound. No HSM. EXTR: No c/c/e PSYCH: Normally interactive. Conversant.  Foot exam:   Assessment and Plan: ***  Signed Abbe Amsterdam, MD

## 2023-04-27 ENCOUNTER — Ambulatory Visit: Payer: 59 | Admitting: Family Medicine

## 2023-04-27 DIAGNOSIS — I10 Essential (primary) hypertension: Secondary | ICD-10-CM

## 2023-04-27 DIAGNOSIS — E119 Type 2 diabetes mellitus without complications: Secondary | ICD-10-CM

## 2023-04-27 DIAGNOSIS — M329 Systemic lupus erythematosus, unspecified: Secondary | ICD-10-CM

## 2023-04-27 DIAGNOSIS — I4891 Unspecified atrial fibrillation: Secondary | ICD-10-CM

## 2023-05-02 NOTE — Progress Notes (Unsigned)
Healthcare at [facility] [address], [address] [place], [name] [address] [id number] [phone]  Date:  05/06/2023   Name:  Amanda Saunders   DOB:  [birth year]   MRN:  [id number]  PCP:  Pearline Cables, MD    Chief Complaint: No chief complaint on file.   History of Present Illness:  Amanda Saunders is a 67 y.o. very pleasant female patient who presents with the following:  Patient seen today with concern of nausea Most recent visit with myself was in March  History of lupus, rheumatoid arthritis, cardiomyopathy, hypertension, diabetes, stroke January 2020  Dr Everlena Cooper is her neurologist Dr Dierdre Forth rheumatology [facility] Infusion done 2 days ago; she gets this every 30 days.  Patient notes her are symptoms are stable  She had knee replacement surgery in May, return to the OR in August for a knee irrigation due to wound dehiscence  Eye exam Foot exam Flu shot Can update urine microalbumin COVID-vaccine Shingrix Lab Results  Component Value Date   HGBA1C 5.9 (H) 12/04/2022   Eliquis Lasix Gabapentin Metformin Toprol-XL Prilosec Ozempic 2 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   Systemic lupus erythematosus (HCC) 08/04/2019   Acute ischemic stroke (HCC) 08/14/2018   HTN (hypertension) 08/14/2018   Controlled type 2 diabetes mellitus without complication, without long-term current use of insulin (HCC) 04/03/2016   History of CVA (cerebrovascular accident) 03/12/2016   Chest pain at rest 03/12/2016   Peripheral edema 03/12/2016    Past Medical History:  Diagnosis Date   Anemia    Aortic atherosclerosis (HCC)     Arthritis    Atrial fibrillation (HCC)    Cardiomyopathy (HCC)    CHF (congestive heart failure) (HCC)    Diabetes mellitus without complication (HCC)    Dyspnea    Dysrhythmia    GERD (gastroesophageal reflux disease)    Headache    Heart murmur    at birth   Hypertension    Internal hemorrhoids    Lupus (HCC)    PUD (peptic ulcer disease)    Stroke (HCC) 08/13/2018   Vertigo     Past Surgical History:  Procedure Laterality Date   BACK SURGERY     CARDIOVERSION N/A 07/03/2022   Procedure: CARDIOVERSION;  Surgeon: Little Ishikawa, MD;  Location: [facility] ENDOSCOPY;  Service: Cardiovascular;  Laterality: N/A;   CARPAL TUNNEL RELEASE Bilateral    COLONOSCOPY     IRRIGATION AND DEBRIDEMENT KNEE Left 03/04/2023   Procedure: Left knee irrigation and debridement, wound closure;  Surgeon: Samson Frederic, MD;  Location: [facility] ORS;  Service: Orthopedics;  Laterality: Left;   KNEE ARTHROPLASTY Left 12/17/2022   Procedure: COMPUTER ASSISTED TOTAL KNEE ARTHROPLASTY;  Surgeon: Samson Frederic, MD;  Location: [facility] ORS;  Service: Orthopedics;  Laterality: Left;   KNEE CARTILAGE SURGERY Left    MENISECTOMY Left    NECK SURGERY     2015   UPPER GASTROINTESTINAL ENDOSCOPY      Social History   Tobacco Use   Smoking status: Former    Current packs/day: 0.00    Types: Cigarettes    Quit date: 07/21/2002    Years since quitting: 20.7  Smokeless tobacco: Never  Vaping Use   Vaping status: Never Used  Substance Use Topics   Alcohol use: Not Currently    Comment: occasional   Drug use: No    Family History  Problem Relation Age of Onset   Hypertension Mother    Arthritis Mother    Heart failure Mother    Stroke Mother    Kidney disease Father    Hypertension Father    Colon cancer Neg Hx    Esophageal cancer Neg Hx    Rectal cancer Neg Hx    Stomach cancer Neg Hx     Allergies  Allergen Reactions   Pholcodine Nausea Only   Codeine Nausea And Vomiting    ALL FORMS    Oxycodone Nausea And Vomiting    Medication list has been reviewed and updated.  Current Outpatient Medications on File Prior to Visit  Medication Sig Dispense Refill   acetaminophen (TYLENOL) 650 MG CR tablet Take 1,300 mg by mouth every 8 (eight) hours as needed for pain.     apixaban (ELIQUIS) 5 MG TABS tablet Take 1 tablet (5 mg total) by mouth 2 (two) times daily. 180 tablet 1   cephALEXin (KEFLEX) 500 MG capsule Take 500 mg by mouth 2 (two) times daily.     Cholecalciferol (VITAMIN D-3) 125 MCG (5000 UT) TABS Take 5,000 Units by mouth daily.     CINNAMON PO Take 500 mg by mouth daily.     furosemide (LASIX) 20 MG tablet Take 1 tablet once daily and may take an extra tablet as needed for shortness of breath or weight gain 180 tablet 3   gabapentin (NEURONTIN) 800 MG tablet TAKE ONE TABLET BY MOUTH every morning and TAKE ONE TABLET BY MOUTH everyday AT bedtime 60 tablet 10   Ginger, Zingiber officinalis, (GINGER PO) Take 550 mg by mouth daily.     hydroxychloroquine (PLAQUENIL) 200 MG tablet Take 200 mg by mouth 2 (two) times daily.     losartan (COZAAR) 50 MG tablet TAKE 1 TABLET BY MOUTH EVERY MORNING 30 tablet 10   Magnesium 250 MG TABS Take 250 mg by mouth daily.     metFORMIN (GLUCOPHAGE) 500 MG tablet TAKE 1 TABLET BY MOUTH EVERY DAY AT BEDTIME 30 tablet 10   metoprolol succinate (TOPROL-XL) 50 MG 24 hr tablet Take 1 tablet (50 mg total) by mouth daily. Take with or immediately following a meal. 90 tablet 3   Omega 3-6-9 Fatty Acids (TRIPLE OMEGA COMPLEX PO) Take 1 capsule by mouth every other day.     omeprazole (PRILOSEC) 40 MG capsule TAKE 1 CAPSULE BY MOUTH ONCE DAILY 30 capsule 10   ondansetron (ZOFRAN) 4 MG tablet Take 1 tablet (4 mg total) by mouth every 8 (eight) hours as needed for nausea or vomiting. 30 tablet 0   rosuvastatin (CRESTOR) 20 MG tablet TAKE ONE TABLET BY MOUTH EVERYDAY AT BEDTIME 90 tablet 3   Semaglutide, 2 MG/DOSE, 8 MG/3ML SOPN Inject 2 mg as directed once  a week. 9 mL 3   sucralfate (CARAFATE) 1 g tablet Take 1 tablet (1 g total) by mouth 4 (four) times daily -  with meals and at bedtime. As needed 40 tablet 1   traMADol (ULTRAM) 50 MG tablet Take 1 tablet (50 mg total) by mouth every 6 (six) hours as needed for moderate pain. 30 tablet 0   TURMERIC PO Take 500 mg by mouth daily.     Vitamin A 2400 MCG (8000 UT)  CAPS Take 2,400 Units by mouth daily.     No current facility-administered medications on file prior to visit.    Review of Systems:  As per HPI- otherwise negative.   Physical Examination: There were no vitals filed for this visit. There were no vitals filed for this visit. There is no height or weight on file to calculate BMI. Ideal Body Weight:    GEN: no acute distress. HEENT: Atraumatic, Normocephalic.  Ears and Nose: No external deformity. CV: RRR, No M/G/R. No JVD. No thrill. No extra heart sounds. PULM: CTA B, no wheezes, crackles, rhonchi. No retractions. No resp. distress. No accessory muscle use. ABD: S, NT, ND, +BS. No rebound. No HSM. EXTR: No c/c/e PSYCH: Normally interactive. Conversant.  Foot exam  Assessment and Plan: ***  Signed Abbe Amsterdam, MD

## 2023-05-02 NOTE — Patient Instructions (Incomplete)
It was good to see you again today!    Recommend shingles vaccine series, COVID booster at your pharmacy, and annual flu shot I suspect your nausea is coming from the Ozempic- let's try the lower dose, 1 mg.  Please let me know how this works for you If you are getting worse or having pain please contact me asap!    Assuming all is well recheck in 4-6 months

## 2023-05-06 ENCOUNTER — Encounter: Payer: Self-pay | Admitting: Family Medicine

## 2023-05-06 ENCOUNTER — Ambulatory Visit (INDEPENDENT_AMBULATORY_CARE_PROVIDER_SITE_OTHER): Payer: 59 | Admitting: Family Medicine

## 2023-05-06 VITALS — BP 120/80 | HR 75 | Temp 98.3°F | Resp 18 | Wt 165.0 lb

## 2023-05-06 DIAGNOSIS — R110 Nausea: Secondary | ICD-10-CM

## 2023-05-06 DIAGNOSIS — Z7984 Long term (current) use of oral hypoglycemic drugs: Secondary | ICD-10-CM | POA: Diagnosis not present

## 2023-05-06 DIAGNOSIS — E119 Type 2 diabetes mellitus without complications: Secondary | ICD-10-CM

## 2023-05-06 DIAGNOSIS — I10 Essential (primary) hypertension: Secondary | ICD-10-CM

## 2023-05-06 LAB — CBC
HCT: 35.2 % — ABNORMAL LOW (ref 36.0–46.0)
Hemoglobin: 11.3 g/dL — ABNORMAL LOW (ref 12.0–15.0)
MCHC: 32 g/dL (ref 30.0–36.0)
MCV: 76.6 fL — ABNORMAL LOW (ref 78.0–100.0)
Platelets: 230 10*3/uL (ref 150.0–400.0)
RBC: 4.6 Mil/uL (ref 3.87–5.11)
RDW: 17.7 % — ABNORMAL HIGH (ref 11.5–15.5)
WBC: 5.8 10*3/uL (ref 4.0–10.5)

## 2023-05-06 LAB — COMPREHENSIVE METABOLIC PANEL
ALT: 18 U/L (ref 0–35)
AST: 22 U/L (ref 0–37)
Albumin: 4 g/dL (ref 3.5–5.2)
Alkaline Phosphatase: 87 U/L (ref 39–117)
BUN: 19 mg/dL (ref 6–23)
CO2: 31 meq/L (ref 19–32)
Calcium: 9.3 mg/dL (ref 8.4–10.5)
Chloride: 104 meq/L (ref 96–112)
Creatinine, Ser: 0.79 mg/dL (ref 0.40–1.20)
GFR: 78.11 mL/min (ref >60.00)
Glucose, Bld: 90 mg/dL (ref 70–99)
Potassium: 4.2 meq/L (ref 3.5–5.1)
Sodium: 141 meq/L (ref 135–145)
Total Bilirubin: 0.2 mg/dL (ref 0.2–1.2)
Total Protein: 6.5 g/dL (ref 6.0–8.3)

## 2023-05-06 LAB — LIPASE: Lipase: 40 U/L (ref 11.0–59.0)

## 2023-05-06 LAB — MICROALBUMIN / CREATININE URINE RATIO
Creatinine,U: 168.1 mg/dL
Microalb Creat Ratio: 0.7 mg/g (ref 0.0–30.0)
Microalb, Ur: 1.2 mg/dL (ref 0.0–1.9)

## 2023-05-06 LAB — HEMOGLOBIN A1C: Hgb A1c MFr Bld: 6.4 % (ref 4.6–6.5)

## 2023-05-06 MED ORDER — ONDANSETRON HCL 4 MG PO TABS: 4.0000 mg | ORAL_TABLET | Freq: Three times a day (TID) | ORAL | 0 refills | Status: DC | PRN

## 2023-05-06 MED ORDER — SEMAGLUTIDE (1 MG/DOSE) 4 MG/3ML ~~LOC~~ SOPN: 1.0000 mg | PEN_INJECTOR | SUBCUTANEOUS | 2 refills | Status: DC

## 2023-05-13 NOTE — Progress Notes (Unsigned)
Virtual Visit via Video Note  Consent was obtained for video visit:  Yes.   Answered questions that patient had about telehealth interaction:  Yes.   I discussed the limitations, risks, security and privacy concerns of performing an evaluation and management service by telemedicine. I also discussed with the patient that there may be a patient responsible charge related to this service. The patient expressed understanding and agreed to proceed.  Pt location: Home Physician Location: office Name of referring provider:  Copland, Gwenlyn Found, MD I connected with Con Memos at patients initiation/request on 05/14/2023 at 10:50 AM EDT by video enabled telemedicine application and verified that I am speaking with the correct person using two identifiers. Pt MRN:  [id number] Pt DOB:  [birth year] Video Participants:  Con Memos  Assessment/Plan:   Bilateral occipital neuralgia Headache with disequilibrium - recurrence of habitual headache.   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  If ineffective, options include increasing gabapentin or schedule for occipital nerve block Gabapentin 800mg  twice daily Management of stroke risk factors as per PCP and cardiology: Eliquis LDL goal less than 70 Hgb O7F goal less than 7 Normotensive blood pressure Follow up 3 to 4 months.     Subjective:  Amanda Saunders is a 67 year old left-handed woman with  SLE, a fib, HTN and history of stroke who follows up for headache.   UPDATE: She started having flare up of the occipital headaches about 6 weeks ago.  She reports a lightening bolt from base of head to the front, either or both sides.  Up to 30 minutes.  Once a week.  Not triggered by neck movement   Typical headaches are controlled.  She had one last week but otherwise no recent  headaches.     Current NSAIDs:  ASA 81mg , Motrin Current analgesic:  Tylenol, Tramadol Current anti-emetic:  Zofran 4mg  Current muscle relaxant:  none Current antihypertensive:  HCTZ, losartan, Toprol XL Current antiepileptic:  gabapentin 800mg  twice daily Current antihistamine:  Meclizine Other medication:  Eliquis, Plaquenil    HISTORY: Headache: On 03/03/19, she developed a sudden severe pounding right sided headache (radiating across forehead) with right facial burning.  There was associated nausea, sometimes vomiting, photophobia and phonophobia.  No visual disturbance.  She felt that her equilibrium was off.  She went to the ED on 03/11/19 for further evaluation. CT head personally reviewed and negative for acute intracranial abnormality such as hemorrhage or infarct.  CBC and BMP were unremarkable.  She was given a headache cocktail of Reglan and Benadryl and discharged home.  Headaches are still present but not as severe.  She reported temperature of 99.  They lessened on 8/24.  Headache is mild to moderate, lasting a couple of hours off and on, daily.  Sometimes she feels shaking in her head.  Reports a little bit of neck pain.     04/17/2019 MRI w wo and MRA of head: advanced widespread chronic small vessel ischemic changes as well as occlusion of right distal M1 segment and severe stenoses of left M2 branches, A2 branches and mild irregularity of the PCA branches, but no acute intracranial abnormality. 04/13/2019 Sed Rate 11.   History of CVA: She was admitted to [facility] on 08/14/18 for increased left arm numbness and weakness with left sided tremor as well as headache, dizziness with nausea and vomiting.  CT of head was personally reviewed and showed no acute findings.  MRI of brain personally reviewed and demonstrated scattered acute watershed subcortical and small cortical infarcts in the right MCA/ACA and MCA/PCA areas.  MRA of head personally reviewed showed severe stenosis  or short segment occlusion of the distal right MCA M1 and proximal M2 segments as well as proximal severe stenosis or short segment occlusion of left MCA M2 segment.  Carotid doppler showed no hemodynamically significant stenosis.  2D echocardiogram showed EF 45-50%.  LDL was 77.  Hgb A1c was 6.2.  ASA 81mg  daily was switched to ASA 325mg  and Plavix 75mg  daily for 3 months with plan to subsequently continue Plavix alone.  She was continued on atorvastatin 40mg  daily.     Past medication:  tramadol, naproxen, Robaxin  Past Medical History: Past Medical History:  Diagnosis Date   Anemia    Aortic atherosclerosis (HCC)    Arthritis    Atrial fibrillation (HCC)    Cardiomyopathy (HCC)    CHF (congestive heart failure) (HCC)    Diabetes mellitus without complication (HCC)    Dyspnea    Dysrhythmia    GERD (gastroesophageal reflux disease)    Headache    Heart murmur    at birth   Hypertension    Internal hemorrhoids    Lupus (HCC)    PUD (peptic ulcer disease)    Stroke (HCC) 08/13/2018   Vertigo     Medications: Outpatient Encounter Medications as of 05/14/2023  Medication Sig   acetaminophen (TYLENOL) 650 MG CR tablet Take 1,300 mg by mouth every 8 (eight) hours as needed for pain.   apixaban (ELIQUIS) 5 MG TABS tablet Take 1 tablet (5 mg total) by mouth 2 (two) times daily.   Cholecalciferol (VITAMIN D-3) 125 MCG (5000 UT) TABS Take 5,000 Units by mouth daily.   CINNAMON PO Take 500 mg by mouth daily.   furosemide (LASIX) 20 MG tablet Take 1 tablet once daily and may take an extra tablet as needed for shortness of breath or weight gain   gabapentin (NEURONTIN) 800 MG tablet TAKE ONE TABLET BY MOUTH every morning and TAKE ONE TABLET BY MOUTH everyday AT bedtime   hydroxychloroquine (PLAQUENIL) 200 MG tablet Take 200 mg by mouth 2 (two) times daily.   losartan (COZAAR) 50 MG tablet TAKE 1 TABLET BY MOUTH EVERY MORNING   Magnesium 250 MG TABS Take 250 mg by mouth daily.   metFORMIN  (GLUCOPHAGE) 500 MG tablet TAKE 1 TABLET BY MOUTH EVERY DAY AT BEDTIME   metoprolol succinate (TOPROL-XL) 50 MG 24 hr tablet Take 1 tablet (50 mg total) by mouth daily. Take with or immediately following a meal.   omeprazole (PRILOSEC) 40 MG capsule TAKE 1 CAPSULE BY MOUTH ONCE DAILY   ondansetron (ZOFRAN) 4 MG tablet Take 1 tablet (4 mg total) by mouth every 8 (eight) hours as needed for nausea or vomiting.   rosuvastatin (CRESTOR) 20 MG tablet TAKE ONE TABLET BY MOUTH EVERYDAY AT BEDTIME   Semaglutide, 1 MG/DOSE, 4 MG/3ML SOPN Inject 1 mg as directed once a week.   sucralfate (CARAFATE) 1 g tablet Take 1 tablet (1 g total) by mouth 4 (four) times daily -  with meals and at bedtime. As needed   traMADol (ULTRAM) 50 MG tablet Take 1 tablet (50 mg total) by mouth every 6 (six) hours as needed for moderate pain.   Vitamin A 2400 MCG (8000 UT) CAPS Take 2,400 Units by mouth daily.   [  DISCONTINUED] Ginger, Zingiber officinalis, (GINGER PO) Take 550 mg by mouth daily.   Omega 3-6-9 Fatty Acids (TRIPLE OMEGA COMPLEX PO) Take 1 capsule by mouth every other day. (Patient not taking: Reported on 05/14/2023)   [DISCONTINUED] TURMERIC PO Take 500 mg by mouth daily. (Patient not taking: Reported on 05/14/2023)   No facility-administered encounter medications on file as of 05/14/2023.    Allergies: Allergies  Allergen Reactions   Pholcodine Nausea Only   Codeine Nausea And Vomiting    ALL FORMS   Oxycodone Nausea And Vomiting    Family History: Family History  Problem Relation Age of Onset   Hypertension Mother    Arthritis Mother    Heart failure Mother    Stroke Mother    Kidney disease Father    Hypertension Father    Colon cancer Neg Hx    Esophageal cancer Neg Hx    Rectal cancer Neg Hx    Stomach cancer Neg Hx     Observations/Objective:   No acute distress.  Alert and oriented.  Speech fluent and not dysarthric.  Language intact.  Eyes orthophoric on primary gaze.  Face  symmetric.   Follow Up Instructions:    -I discussed the assessment and treatment plan with the patient. The patient was provided an opportunity to ask questions and all were answered. The patient agreed with the plan and demonstrated an understanding of the instructions.   The patient was advised to call back or seek an in-person evaluation if the symptoms worsen or if the condition fails to improve as anticipated.   Cira Servant, DO  CC: Abbe Amsterdam, MD

## 2023-05-14 ENCOUNTER — Encounter: Payer: Self-pay | Admitting: Neurology

## 2023-05-14 ENCOUNTER — Telehealth: Payer: 59 | Admitting: Neurology

## 2023-05-14 DIAGNOSIS — M5481 Occipital neuralgia: Secondary | ICD-10-CM

## 2023-05-14 DIAGNOSIS — Z8673 Personal history of transient ischemic attack (TIA), and cerebral infarction without residual deficits: Secondary | ICD-10-CM

## 2023-05-14 DIAGNOSIS — R519 Headache, unspecified: Secondary | ICD-10-CM

## 2023-05-15 ENCOUNTER — Ambulatory Visit: Payer: No Typology Code available for payment source

## 2023-05-19 ENCOUNTER — Ambulatory Visit: Payer: 59

## 2023-05-20 ENCOUNTER — Ambulatory Visit (INDEPENDENT_AMBULATORY_CARE_PROVIDER_SITE_OTHER): Payer: 59 | Admitting: Gastroenterology

## 2023-05-20 ENCOUNTER — Encounter: Payer: Self-pay | Admitting: Gastroenterology

## 2023-05-20 ENCOUNTER — Telehealth: Payer: Self-pay

## 2023-05-20 VITALS — BP 118/68 | HR 80 | Ht 63.0 in | Wt 168.0 lb

## 2023-05-20 DIAGNOSIS — Z7901 Long term (current) use of anticoagulants: Secondary | ICD-10-CM

## 2023-05-20 DIAGNOSIS — K219 Gastro-esophageal reflux disease without esophagitis: Secondary | ICD-10-CM | POA: Diagnosis not present

## 2023-05-20 NOTE — Progress Notes (Signed)
Assessment     Dysphagia with solids, pills and liquids, possible GERD - R/O stricture, esophageal dysmotility, oropharyngeal dysphagia h/o PUD  CRC screening is up-to-date Afib, h/o CVA maintained on Eliquis DM on semaglutide    Recommendations    Schedule EGD with possible dilation. The risks (including bleeding, perforation, infection, missed lesions, medication reactions and possible hospitalization or surgery if complications occur), benefits, and alternatives to endoscopy with possible biopsy and possible dilation were discussed with the patient and they consent to proceed.   If EGD is unrevealing consider barium esophagram and MBSS Continue omeprazole 40 mg daily Hold Eliquis 2 days before procedure - will instruct when and how to resume after procedure. Low but real risk of cardiovascular event such as heart attack, stroke, embolism, thrombosis or ischemia/infarct of other organs off Eliquis explained and need to seek urgent help if this occurs. The patient consents to proceed. Will communicate by phone or EMR with patient's prescribing provider to confirm that holding Eliquis is reasonable in this case.   Hold semaglutide per protocol   HPI    This is a 67 year old female returning for dysphagia.  She was evaluated by Doug Sou, PA-C in November 2023 with nausea, vomiting and dysphagia.  EGD was scheduled however she canceled and rescheduled on a couple occasions so EGD was not completed.  She relates intermittent nausea and no recent episodes of vomiting.  She states she has difficulty swallowing larger pills and occasionally solid foods.  She also occasionally notes it is difficult to swallow liquids.  She is maintained on omeprazole 40 mg daily however she does not recall why.  She relates she would like to schedule colonoscopy - she did not recall that she underwent colonoscopy in 2023.   Colonoscopy May 2023 - One 7 mm polyp in the sigmoid colon, removed with a cold  snare. Resected and retrieved. Path: HP - Moderate diverticulosis in the left colon.  - Anal papilla(e) were hypertrophied.  - Internal hemorrhoids.  - The examination was otherwise normal on direct and retroflexion views.   Labs / Imaging       Latest Ref Rng & Units 05/06/2023    9:24 AM 10/08/2022   10:09 AM 07/28/2022   11:22 AM  Hepatic Function  Total Protein 6.0 - 8.3 g/dL 6.5  6.8  7.1   Albumin 3.5 - 5.2 g/dL 4.0  4.2  4.4   AST 0 - 37 U/L 22  21  34   ALT 0 - 35 U/L 18  17  24    Alk Phosphatase 39 - 117 U/L 87  76  87   Total Bilirubin 0.2 - 1.2 mg/dL 0.2  0.3  0.3   Bilirubin, Direct 0.00 - 0.40 mg/dL   1.61        Latest Ref Rng & Units 05/06/2023    9:24 AM 03/04/2023    1:05 PM 12/19/2022    3:55 AM  CBC  WBC 4.0 - 10.5 K/uL 5.8  4.8  7.5   Hemoglobin 12.0 - 15.0 g/dL 09.6  04.5  8.7   Hematocrit 36.0 - 46.0 % 35.2  35.3  25.9   Platelets 150.0 - 400.0 K/uL 230.0  264  210     Current Medications, Allergies, Past Medical History, Past Surgical History, Family History and Social History were reviewed in [employer] record.   Physical Exam: General: Well developed, well nourished, no acute distress Head: Normocephalic and atraumatic Eyes: Sclerae  anicteric, EOMI Ears: Normal auditory acuity Mouth: No deformities or lesions noted Lungs: Clear throughout to auscultation Heart: Regular rate and rhythm; No murmurs, rubs or bruits Abdomen: Soft, non tender and non distended. No masses, hepatosplenomegaly or hernias noted. Normal Bowel sounds Rectal: Not done Musculoskeletal: Symmetrical with no gross deformities  Pulses:  Normal pulses noted Extremities: No edema or deformities noted Neurological: Alert oriented x 4, memory deficits Psychological:  Alert and cooperative. Normal mood and affect   Annsley Akkerman T. Russella Dar, MD 05/20/2023, 9:40 AM

## 2023-05-20 NOTE — Telephone Encounter (Signed)
Pharmacy please advise on holding Eliquis prior to EGD scheduled for 06/04/23. Thank you.

## 2023-05-20 NOTE — Telephone Encounter (Signed)
Is a 1 day hold for Eliquis ok Dr. Fuller Plan?

## 2023-05-20 NOTE — Telephone Encounter (Signed)
OK 

## 2023-05-20 NOTE — Patient Instructions (Signed)
You have been scheduled for an endoscopy. Please follow written instructions given to you at your visit today.  If you use inhalers (even only as needed), please bring them with you on the day of your procedure.  If you take any of the following medications, they will need to be adjusted prior to your procedure:   DO NOT TAKE 7 DAYS PRIOR TO TEST- Trulicity (dulaglutide) Ozempic, Wegovy (semaglutide) Mounjaro (tirzepatide) Bydureon Bcise (exanatide extended release)  DO NOT TAKE 1 DAY PRIOR TO YOUR TEST Rybelsus (semaglutide) Adlyxin (lixisenatide) Victoza (liraglutide) Byetta (exanatide) _________________________________________________________________________ The [facility] providers would like to encourage you to use [facility] to communicate with providers for non-urgent requests or questions.  Due to long hold times on the telephone, sending your provider a message by [facility] may be a faster and more efficient way to get a response.  Please allow 48 business hours for a response.  Please remember that this is for non-urgent requests.   Due to recent changes in healthcare laws, you may see the results of your imaging and laboratory studies on MyChart before your provider has had a chance to review them.  We understand that in some cases there may be results that are confusing or concerning to you. Not all laboratory results come back in the same time frame and the provider may be waiting for multiple results in order to interpret others.  Please give [place] 48 hours in order for your provider to thoroughly review all the results before contacting the office for clarification of your results.  Thank you for choosing me and  Gastroenterology.  Venita Lick. Pleas Koch., MD., Clementeen Graham

## 2023-05-20 NOTE — Telephone Encounter (Signed)
Amanda Saunders Sep 16, 1955 [id number]  Dear Dr. Everlena Cooper:  We have scheduled the above named patient for a(n) EGD procedure. Our records show that (s)he is on anticoagulation therapy.  Please advise as to whether the patient may come off their therapy of Eliquis 2 days prior to their procedure which is scheduled for 06/04/23.  Please route your response to Jovita Kussmaul, CMA.  Sincerely,    [facility]

## 2023-05-20 NOTE — Telephone Encounter (Signed)
Patient with diagnosis of afib on Eliquis for anticoagulation.    Procedure: EGD Date of procedure: 06/04/23  CHA2DS2-VASc Score = 8  This indicates a 10.8% annual risk of stroke. The patient's score is based upon: CHF History: 1 HTN History: 1 Diabetes History: 1 Stroke History: 2 Vascular Disease History: 1 Age Score: 1 Gender Score: 1   Stroke in 2017  CrCl 32mL/min using adjusted body weight Platelet count 230K  Would prefer 1 day Eliquis hold prior to procedure if possible given elevated cardiovascular risk including prior stroke.  **This guidance is not considered finalized until pre-operative APP has relayed final recommendations.**

## 2023-05-20 NOTE — Telephone Encounter (Signed)
Patient Name: RAMYAH WITCHER  DOB: September 18, 1955 MRN: [id number]  Primary Cardiologist: Olga Millers, MD  Clinical pharmacists have reviewed the patient's past medical history, labs, and current medications as part of preoperative protocol coverage. The following recommendations have been made:   Would prefer 1 day Eliquis hold prior to procedure if possible given elevated cardiovascular risk including prior stroke.    I will route this recommendation to the requesting party via Epic fax function and remove from pre-op pool.  Please call with questions.  Napoleon Form, Leodis Rains, NP 05/20/2023, 3:04 PM

## 2023-05-20 NOTE — Telephone Encounter (Signed)
[facility] Pre-operative Risk Assessment     Request for surgical clearance:     Endoscopy Procedure  What type of surgery is being performed?     EGD  When is this surgery scheduled?     06/04/23  What type of clearance is required ?   Pharmacy  Are there any medications that need to be held prior to surgery and how long? Eliquis x 2 days  Practice name and name of physician performing surgery?      [facility]  What is your office phone and fax number?      Phone- [phone]  Fax- [phone]  Anesthesia type (None, local, MAC, general) ?       MAC

## 2023-05-20 NOTE — Telephone Encounter (Signed)
Informed patient to hold Eliquis 1 day prior to her procedure. Patient verbalized understanding.

## 2023-05-21 DIAGNOSIS — M4727 Other spondylosis with radiculopathy, lumbosacral region: Secondary | ICD-10-CM | POA: Diagnosis not present

## 2023-05-21 DIAGNOSIS — M9903 Segmental and somatic dysfunction of lumbar region: Secondary | ICD-10-CM | POA: Diagnosis not present

## 2023-05-21 DIAGNOSIS — M329 Systemic lupus erythematosus, unspecified: Secondary | ICD-10-CM | POA: Diagnosis not present

## 2023-05-21 DIAGNOSIS — M9901 Segmental and somatic dysfunction of cervical region: Secondary | ICD-10-CM | POA: Diagnosis not present

## 2023-05-21 DIAGNOSIS — R293 Abnormal posture: Secondary | ICD-10-CM | POA: Diagnosis not present

## 2023-05-21 DIAGNOSIS — M9902 Segmental and somatic dysfunction of thoracic region: Secondary | ICD-10-CM | POA: Diagnosis not present

## 2023-05-21 DIAGNOSIS — M47812 Spondylosis without myelopathy or radiculopathy, cervical region: Secondary | ICD-10-CM | POA: Diagnosis not present

## 2023-05-21 DIAGNOSIS — M9904 Segmental and somatic dysfunction of sacral region: Secondary | ICD-10-CM | POA: Diagnosis not present

## 2023-05-21 DIAGNOSIS — M9905 Segmental and somatic dysfunction of pelvic region: Secondary | ICD-10-CM | POA: Diagnosis not present

## 2023-06-02 ENCOUNTER — Encounter: Payer: 59 | Admitting: Gastroenterology

## 2023-06-03 ENCOUNTER — Encounter: Payer: Self-pay | Admitting: Family Medicine

## 2023-06-04 ENCOUNTER — Encounter: Payer: Self-pay | Admitting: Gastroenterology

## 2023-06-04 ENCOUNTER — Ambulatory Visit: Payer: 59 | Admitting: Gastroenterology

## 2023-06-04 VITALS — BP 141/69 | HR 78 | Temp 97.5°F | Resp 24 | Ht 63.0 in | Wt 168.0 lb

## 2023-06-04 DIAGNOSIS — K219 Gastro-esophageal reflux disease without esophagitis: Secondary | ICD-10-CM

## 2023-06-04 DIAGNOSIS — R1310 Dysphagia, unspecified: Secondary | ICD-10-CM

## 2023-06-04 MED ORDER — SODIUM CHLORIDE 0.9 % IV SOLN: 500.0000 mL | Freq: Once | INTRAVENOUS | Status: DC

## 2023-06-04 NOTE — Op Note (Signed)
[facility] Patient Name: Jia Leotta Procedure Date: 06/04/2023 1:24 PM MRN: [id number] Endoscopist: Meryl Dare , MD, [phone] Age: 67 Referring MD:  Date of Birth: 01/24/1956 Gender: Female Account #: [id number] Procedure:                Upper GI endoscopy Indications:              Dysphagia, Gastroesophageal reflux disease Medicines:                Monitored Anesthesia Care Procedure:                Pre-Anesthesia Assessment:                           - Prior to the procedure, a History and Physical                            was performed, and patient medications and                            allergies were reviewed. The patient's tolerance of                            previous anesthesia was also reviewed. The risks                            and benefits of the procedure and the sedation                            options and risks were discussed with the patient.                            All questions were answered, and informed consent                            was obtained. Prior Anticoagulants: The patient has                            taken Eliquis (apixaban), last dose was 6 days                            prior to procedure. ASA Grade Assessment: III - A                            patient with severe systemic disease. After                            reviewing the risks and benefits, the patient was                            deemed in satisfactory condition to undergo the                            procedure.  After obtaining informed consent, the endoscope was                            passed under direct vision. Throughout the                            procedure, the patient's blood pressure, pulse, and                            oxygen saturations were monitored continuously. The                            GIF [id number] #[id number] was introduced through the                            mouth, and advanced to the second  part of duodenum.                            The upper GI endoscopy was accomplished without                            difficulty. The patient tolerated the procedure                            well. Scope In: Scope Out: Findings:                 No endoscopic abnormality was evident in the                            esophagus to explain the patient's complaint of                            dysphagia. A guidewire was placed and the scope was                            withdrawn. Dilation was performed with a Savary                            dilator with no resistance at 17 mm.                           Mild prepyloric erythema, otherwise the stomach was                            normal.                           The duodenal bulb and second portion of the                            duodenum were normal. Complications:            No immediate complications. Estimated Blood Loss:     Estimated blood loss was minimal. Impression:               -  No endoscopic esophageal abnormality to explain                            patient's dysphagia. Dilated.                           - Mild prepyloric erythema.                           - Normal duodenal bulb and second portion of the                            duodenum.                           - No specimens collected. Recommendation:           - Patient has a contact number available for                            emergencies. The signs and symptoms of potential                            delayed complications were discussed with the                            patient. Return to normal activities tomorrow.                            Written discharge instructions were provided to the                            patient.                           - Clear liquid diet for 2 hours, then advance as                            tolerated to soft diet today.                           - Resume prior diet tomorrow.                           - Follow  antireflux measures.                           - Continue present medications.                           - Resume Eliquis (apixaban) at prior dose tomorrow.                            Refer to managing physician for further adjustment                            of therapy. Meryl Dare,  MD 06/04/2023 1:42:37 PM This report has been signed electronically.

## 2023-06-04 NOTE — Progress Notes (Signed)
See 05/20/2023 H&P no changes

## 2023-06-04 NOTE — Patient Instructions (Addendum)
Educational handout provided to patient related to stricture & post dilation diet  FOLLOW POST DILATION DIET- CLEAR LIQUIDS FOR 2 HOURS AND THEN SOFT DIET FOR THE REST OF THE DAY  Continue present medications- RESUME ELIQUIS AT PRIOR DOSE TOMORROW  YOU HAD AN ENDOSCOPIC PROCEDURE TODAY AT THE [facility]:   Refer to the procedure report that was given to you for any specific questions about what was found during the examination.  If the procedure report does not answer your questions, please call your gastroenterologist to clarify.  If you requested that your care partner not be given the details of your procedure findings, then the procedure report has been included in a sealed envelope for you to review at your convenience later.  YOU SHOULD EXPECT: Some feelings of bloating in the abdomen. Passage of more gas than usual.  Walking can help get rid of the air that was put into your GI tract during the procedure and reduce the bloating. If you had a lower endoscopy (such as a colonoscopy or flexible sigmoidoscopy) you may notice spotting of blood in your stool or on the toilet paper. If you underwent a bowel prep for your procedure, you may not have a normal bowel movement for a few days.  Please Note:  You might notice some irritation and congestion in your nose or some drainage.  This is from the oxygen used during your procedure.  There is no need for concern and it should clear up in a day or so.  SYMPTOMS TO REPORT IMMEDIATELY:  Following upper endoscopy (EGD)  Vomiting of blood or coffee ground material  New chest pain or pain under the shoulder blades  Painful or persistently difficult swallowing  New shortness of breath  Fever of 100F or higher  Black, tarry-looking stools  For urgent or emergent issues, a gastroenterologist can be reached at any hour by calling [phone]. Do not use MyChart messaging for urgent concerns.    DIET:  We do recommend a small meal at  first, but then you may proceed to your regular diet.  Drink plenty of fluids but you should avoid alcoholic beverages for 24 hours.  ACTIVITY:  You should plan to take it easy for the rest of today and you should NOT DRIVE or use heavy machinery until tomorrow (because of the sedation medicines used during the test).    FOLLOW UP: Our staff will call the number listed on your records the next business day following your procedure.  We will call around 7:15- 8:00 am to check on you and address any questions or concerns that you may have regarding the information given to you following your procedure. If we do not reach you, we will leave a message.     If any biopsies were taken you will be contacted by phone or by letter within the next 1-3 weeks.  Please call us at [phone] if you have not heard about the biopsies in 3 weeks.    SIGNATURES/CONFIDENTIALITY: You and/or your care partner have signed paperwork which will be entered into your electronic medical record.  These signatures attest to the fact that that the information above on your After Visit Summary has been reviewed and is understood.  Full responsibility of the confidentiality of this discharge information lies with you and/or your care-partner.

## 2023-06-04 NOTE — Progress Notes (Signed)
A/O x 3, gd SR's, VSS, report to RN

## 2023-06-05 ENCOUNTER — Telehealth: Payer: Self-pay | Admitting: *Deleted

## 2023-06-05 NOTE — Telephone Encounter (Signed)
  Follow up Call-     06/04/2023   12:29 PM 11/25/2021    1:15 PM  Call back number  Post procedure Call Back phone  # [phone]  Permission to leave phone message Yes Yes     Patient questions:  Do you have a fever, pain , or abdominal swelling? No. Pain Score  0 *  Have you tolerated food without any problems? Yes.    Have you been able to return to your normal activities? Yes.    Do you have any questions about your discharge instructions: Diet   No. Medications  No. Follow up visit  No.  Do you have questions or concerns about your Care? No.  Actions: * If pain score is 4 or above: No action needed, pain <4.

## 2023-06-10 ENCOUNTER — Other Ambulatory Visit (HOSPITAL_BASED_OUTPATIENT_CLINIC_OR_DEPARTMENT_OTHER): Payer: Self-pay

## 2023-06-10 MED ORDER — COVID-19 MRNA VAC-TRIS(PFIZER) 30 MCG/0.3ML IM SUSY
0.3000 mL | PREFILLED_SYRINGE | Freq: Once | INTRAMUSCULAR | 0 refills | Status: AC
  Filled 2023-06-10: qty 0.3, 1d supply, fill #0

## 2023-06-12 LAB — AMB RESULTS CONSOLE CBG: Glucose: 94

## 2023-06-18 NOTE — Progress Notes (Addendum)
[facility] [address], [address] [place], [name] [address] [phone]  Date:  06/22/2023   Name:  Amanda Saunders   DOB:  1955/09/19   MRN:  [id number]  PCP:  Pearline Cables, MD    Chief Complaint: Memory Loss (Concerns/ questions: these are concerns that she has mentioned within herself./Foot exam is due/)   History of Present Illness:  Amanda Saunders is a 67 y.o. 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  Patient notes her are symptoms are stable  Foot exam-update today Shingrix-encouraged her to have this done  Today she is concerned about memory changes/ loss She has a harder time keeping something in her mind  She has noticed this for about 5 months now She notes her son and her grand-daughter have noticed an issue  Has not mentioned to her neurologist as of yet, she does have an appointment in January She notes her sister has some memory issues as well   No major events- did not get lost, no car accident, etc No physical sx -physically she is feeling fine  Lab Results  Component Value Date   HGBA1C 6.4 05/06/2023     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   Systemic lupus erythematosus (HCC) 08/04/2019   Acute ischemic stroke (HCC) 08/14/2018   HTN  (hypertension) 08/14/2018   Controlled type 2 diabetes mellitus without complication, without long-term current use of insulin (HCC) 04/03/2016   History of CVA (cerebrovascular accident) 03/12/2016   Chest pain at rest 03/12/2016   Peripheral edema 03/12/2016    Past Medical History:  Diagnosis Date   Anemia    Aortic atherosclerosis (HCC)    Arthritis    Atrial fibrillation (HCC)    Cardiomyopathy (HCC)    CHF (congestive heart failure) (HCC)    Diabetes mellitus without complication (HCC)    Dyspnea    Dysrhythmia    GERD (gastroesophageal reflux disease)    Headache    Heart murmur    at birth   Hypertension    Internal hemorrhoids    Lupus    PUD (peptic ulcer disease)    Stroke (HCC) 08/13/2018   Vertigo     Past Surgical History:  Procedure Laterality Date   BACK SURGERY     CARDIOVERSION N/A 07/03/2022   Procedure: CARDIOVERSION;  Surgeon: Little Ishikawa, MD;  Location: [facility];  Service: Cardiovascular;  Laterality: N/A;   CARPAL TUNNEL RELEASE Bilateral    COLONOSCOPY     IRRIGATION AND DEBRIDEMENT KNEE Left 03/04/2023   Procedure: Left knee irrigation and debridement, wound closure;  Surgeon: Samson Frederic, MD;  Location: [facility];  Service: Orthopedics;  Laterality: Left;   KNEE ARTHROPLASTY Left 12/17/2022   Procedure: COMPUTER ASSISTED TOTAL KNEE ARTHROPLASTY;  Surgeon: Samson Frederic, MD;  Location: [facility];  Service: Orthopedics;  Laterality: Left;   KNEE CARTILAGE SURGERY Left    MENISECTOMY Left    NECK SURGERY     2015   UPPER GASTROINTESTINAL ENDOSCOPY      Social History   Tobacco Use   Smoking status: Former    Current packs/day: 0.00    Types: Cigarettes    Quit date: 07/21/2002    Years since quitting: 20.9   Smokeless tobacco: Never  Vaping Use   Vaping status: Never Used  Substance Use Topics   Alcohol use: Not Currently    Comment: occasional   Drug use: No    Family History  Problem Relation Age of Onset    Hypertension Mother    Arthritis Mother    Heart failure Mother    Stroke Mother    Kidney disease Father    Hypertension Father    Colon cancer Neg Hx    Esophageal cancer Neg Hx    Rectal cancer Neg Hx    Stomach cancer Neg Hx     Allergies  Allergen Reactions   Pholcodine Nausea Only   Codeine Nausea And Vomiting    ALL FORMS   Oxycodone Nausea And Vomiting    Medication list has been reviewed and updated.  Current Outpatient Medications on File Prior to Visit  Medication Sig Dispense Refill   acetaminophen (TYLENOL) 650 MG CR tablet Take 1,300 mg by mouth every 8 (eight) hours as needed for pain.     apixaban (ELIQUIS) 5 MG TABS tablet Take 1 tablet (5 mg total) by mouth 2 (two) times daily. 180 tablet 1   Cholecalciferol (VITAMIN D-3) 125 MCG (5000 UT) TABS Take 5,000 Units by mouth daily.     CINNAMON PO Take 500 mg by mouth daily.     furosemide (LASIX) 20 MG tablet Take 1 tablet once daily and may take an extra tablet as needed for shortness of breath or weight gain 180 tablet 3   gabapentin (NEURONTIN) 800 MG tablet TAKE ONE TABLET BY MOUTH every morning and TAKE ONE TABLET BY MOUTH everyday AT bedtime 60 tablet 10   hydroxychloroquine (PLAQUENIL) 200 MG tablet Take 200 mg by mouth 2 (two) times daily.     losartan (COZAAR) 50 MG tablet TAKE 1 TABLET BY MOUTH EVERY MORNING 30 tablet 10   Magnesium 250 MG TABS Take 250 mg by mouth daily.     metFORMIN (GLUCOPHAGE) 500 MG tablet TAKE 1 TABLET BY MOUTH EVERY DAY AT BEDTIME 30 tablet 10   metoprolol succinate (TOPROL-XL) 50 MG 24 hr tablet Take 1 tablet (50 mg total) by mouth daily. Take with or immediately following a meal. 90 tablet 3   omeprazole (PRILOSEC) 40 MG capsule TAKE 1 CAPSULE BY MOUTH ONCE DAILY 30 capsule 10   ondansetron (ZOFRAN) 4 MG tablet Take 1 tablet (4 mg total) by mouth every 8 (eight) hours as needed for nausea or vomiting. 30 tablet 0   rosuvastatin (CRESTOR) 20 MG tablet TAKE ONE TABLET BY MOUTH  EVERYDAY AT BEDTIME 90 tablet 3   Semaglutide, 1 MG/DOSE, 4 MG/3ML SOPN Inject 1 mg as directed once a week. 9 mL 2   sucralfate (CARAFATE) 1 g tablet Take 1 tablet (1 g total) by mouth 4 (four) times daily -  with meals and at bedtime. As needed 40 tablet 1   traMADol (ULTRAM) 50 MG tablet Take 1 tablet (50 mg total) by mouth every 6 (six) hours as needed for moderate pain. 30 tablet 0   Vitamin  A 2400 MCG (8000 UT) CAPS Take 2,400 Units by mouth daily.     No current facility-administered medications on file prior to visit.    Review of Systems:  As per HPI- otherwise negative.   Physical Examination: Vitals:   06/22/23 0924  BP: 124/80  Pulse: 84  Resp: 18  Temp: 98 F (36.7 C)  SpO2: 97%   Vitals:   06/22/23 0924  Weight: 171 lb 6.4 oz (77.7 kg)   Body mass index is 30.36 kg/m. Ideal Body Weight:    GEN: no acute distress.  Mildly obese, looks well HEENT: Atraumatic, Normocephalic.  Ears and Nose: No external deformity. CV: RRR, No M/G/R. No JVD. No thrill. No extra heart sounds. PULM: CTA B, no wheezes, crackles, rhonchi. No retractions. No resp. distress. No accessory muscle use. EXTR: No c/c/e PSYCH: Normally interactive. Conversant.  Normal bilateral foot exam There is a hyperpigmented macule on the plantar surface of her left foot just proximal to the fourth and fifth toe juncture.  We took a photo of this spot, I have asked patient to check it in a month and see if it goes away.  If it does not she will let me know  Assessment and Plan: Memory loss - Plan: B12, RPR, TSH  Mild anemia - Plan: CBC  Patient seen today with concerns about her memory.  She did suffer a stroke and is a neurology patient, she has an appoint with Dr. Everlena Cooper coming up in January She had a right MCA infarct secondary to right MCA stenosis/occlusion and other intracranial stenosis-we are now working on secondary stroke prevention with blood pressure, cholesterol control She quit smoking  20 years ago  Today offered reassurance that many people will notice some minor memory issues as they get older.  I encouraged her to continue managing her blood pressure, cholesterol, and try to get physical as well as mental exercise.  I also encouraged her to become vaccinated against shingles as there is some emerging evidence that shingles disease is linked to dementia.  I will reach out to Dr. Everlena Cooper and let him know of her concerns.  Will also check her B12, RPR, thyroid today  Signed Abbe Amsterdam, MD  Received labs as below, message to patient Results for orders placed or performed in visit on 06/22/23  B12  Result Value Ref Range   Vitamin B-12 790 211 - 911 pg/mL  CBC  Result Value Ref Range   WBC 5.3 4.0 - 10.5 K/uL   RBC 4.63 3.87 - 5.11 Mil/uL   Platelets 266.0 150.0 - 400.0 K/uL   Hemoglobin 11.8 (L) 12.0 - 15.0 g/dL   HCT 09.8 11.9 - 14.7 %   MCV 78.0 78.0 - 100.0 fl   MCHC 32.6 30.0 - 36.0 g/dL   RDW 82.9 (H) 56.2 - 13.0 %  TSH  Result Value Ref Range   TSH 3.08 0.35 - 5.50 uIU/mL

## 2023-06-22 ENCOUNTER — Encounter: Payer: Self-pay | Admitting: Family Medicine

## 2023-06-22 ENCOUNTER — Telehealth: Payer: Self-pay | Admitting: Family Medicine

## 2023-06-22 ENCOUNTER — Other Ambulatory Visit: Payer: Self-pay | Admitting: Family Medicine

## 2023-06-22 ENCOUNTER — Ambulatory Visit (INDEPENDENT_AMBULATORY_CARE_PROVIDER_SITE_OTHER): Payer: 59 | Admitting: Family Medicine

## 2023-06-22 VITALS — BP 124/80 | HR 84 | Temp 98.0°F | Resp 18 | Ht 69.0 in | Wt 171.4 lb

## 2023-06-22 DIAGNOSIS — R110 Nausea: Secondary | ICD-10-CM

## 2023-06-22 DIAGNOSIS — R413 Other amnesia: Secondary | ICD-10-CM

## 2023-06-22 DIAGNOSIS — D649 Anemia, unspecified: Secondary | ICD-10-CM

## 2023-06-22 LAB — CBC
HCT: 36.1 % (ref 36.0–46.0)
Hemoglobin: 11.8 g/dL — ABNORMAL LOW (ref 12.0–15.0)
MCHC: 32.6 g/dL (ref 30.0–36.0)
MCV: 78 fL (ref 78.0–100.0)
Platelets: 266 10*3/uL (ref 150.0–400.0)
RBC: 4.63 Mil/uL (ref 3.87–5.11)
RDW: 18.7 % — ABNORMAL HIGH (ref 11.5–15.5)
WBC: 5.3 10*3/uL (ref 4.0–10.5)

## 2023-06-22 LAB — VITAMIN B12: Vitamin B-12: 790 pg/mL (ref 211–911)

## 2023-06-22 LAB — TSH: TSH: 3.08 u[IU]/mL (ref 0.35–5.50)

## 2023-06-22 NOTE — Patient Instructions (Signed)
I will get in touch with Dr Everlena Cooper (neurology) about your memory concerns We will do a little bloodwork today to look for any explanation for memory changes I would recommend getting the shingles vaccine!   Try to keep your mind and body healthy with regular exercise, healthy diet, and "brain exercise" as well

## 2023-06-22 NOTE — Telephone Encounter (Signed)
Patient forgot to let provider know that she needs a refill on her ondansetron (ZOFRAN) 4 MG tablet. Please send to [facility] on [address].

## 2023-06-22 NOTE — Telephone Encounter (Signed)
Okay for refill?  

## 2023-06-23 DIAGNOSIS — M329 Systemic lupus erythematosus, unspecified: Secondary | ICD-10-CM | POA: Diagnosis not present

## 2023-06-23 LAB — RPR: RPR Ser Ql: NONREACTIVE

## 2023-07-01 ENCOUNTER — Encounter: Payer: Self-pay | Admitting: Family Medicine

## 2023-07-01 DIAGNOSIS — M9904 Segmental and somatic dysfunction of sacral region: Secondary | ICD-10-CM | POA: Diagnosis not present

## 2023-07-01 DIAGNOSIS — M9901 Segmental and somatic dysfunction of cervical region: Secondary | ICD-10-CM | POA: Diagnosis not present

## 2023-07-01 DIAGNOSIS — M9903 Segmental and somatic dysfunction of lumbar region: Secondary | ICD-10-CM | POA: Diagnosis not present

## 2023-07-01 DIAGNOSIS — M9902 Segmental and somatic dysfunction of thoracic region: Secondary | ICD-10-CM | POA: Diagnosis not present

## 2023-07-03 DIAGNOSIS — M9904 Segmental and somatic dysfunction of sacral region: Secondary | ICD-10-CM | POA: Diagnosis not present

## 2023-07-03 DIAGNOSIS — M9902 Segmental and somatic dysfunction of thoracic region: Secondary | ICD-10-CM | POA: Diagnosis not present

## 2023-07-03 DIAGNOSIS — M9901 Segmental and somatic dysfunction of cervical region: Secondary | ICD-10-CM | POA: Diagnosis not present

## 2023-07-03 DIAGNOSIS — M9903 Segmental and somatic dysfunction of lumbar region: Secondary | ICD-10-CM | POA: Diagnosis not present

## 2023-07-17 ENCOUNTER — Other Ambulatory Visit: Payer: Self-pay | Admitting: Neurology

## 2023-07-17 ENCOUNTER — Ambulatory Visit: Payer: Medicare Other | Admitting: Neurology

## 2023-07-18 ENCOUNTER — Other Ambulatory Visit: Payer: Self-pay | Admitting: Cardiology

## 2023-07-18 DIAGNOSIS — I5032 Chronic diastolic (congestive) heart failure: Secondary | ICD-10-CM

## 2023-07-29 ENCOUNTER — Encounter: Payer: Self-pay | Admitting: *Deleted

## 2023-07-29 NOTE — Progress Notes (Signed)
 Pt attended 06/12/23 screening event where her b/p was 137/88 and her blood sugar was 94. At the event the pt noted her PCP was Dr. Harlene Copland from the [facility], that she had insurance, did not smoke, and pt did not identify any SDOH insecurities. Chart review indicates pt saw Dr. Watt on 05/06/23 where physician notes reflect that pt is being followed for HTN, and well controlled DM.  Pt also seen by PCP on 06/22/23, where her b/p was 124/80. Chart review further reveals that pt is receiving  ongoing specialist care for neurology (future appt 08/14/23) and rheumatology (last seen 07/28/23). No additional health equity team support indicated at this time.

## 2023-08-04 DIAGNOSIS — M329 Systemic lupus erythematosus, unspecified: Secondary | ICD-10-CM | POA: Diagnosis not present

## 2023-08-13 NOTE — Progress Notes (Signed)
NEUROLOGY FOLLOW UP OFFICE NOTE  Amanda Saunders [id number]  Assessment/Plan:    Dizziness - query episodes of hypotension in setting of intracranial stenosis. 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 Gabapentin 800mg  twice daily Management of stroke risk factors as per PCP and cardiology: Eliquis Statin.   LDL goal less than 70 Hgb X9J goal less than 7 Normotensive blood pressure Follow up 6 months.     Subjective:  Amanda Saunders is a 68 year old left-handed woman with  SLE, a fib, HTN and history of stroke who follows up for headache.   UPDATE: Plan was to start PT for treatment of bilateral occipital neuralgia flare.  However, she did not go.  She says headaches are overall manageable.  She is more concerned about lightheadedness.  Occurs usually when she is already on her feet.  Feels like she may pass out.  No focal neurologic symptoms.  Usually SBP is 110-120.    Typical headaches are controlled.     Current NSAIDs:  ASA 81mg , Motrin Current analgesic:  Tylenol, Tramadol Current anti-emetic:  Zofran 4mg  Current muscle relaxant:  none Current antihypertensive:  HCTZ, losartan, Toprol XL Current antiepileptic:  gabapentin 800mg  twice daily Current antihistamine:  Meclizine Other medication:  Eliquis, Plaquenil    HISTORY: Headache: On 03/03/19, she developed a sudden severe pounding right sided headache (radiating across forehead) with right facial burning.  There was associated nausea, sometimes vomiting, photophobia and phonophobia.  No visual disturbance.  She felt that her equilibrium was off.  She went to the ED on 03/11/19 for further evaluation. CT head personally reviewed and negative for acute intracranial abnormality such as hemorrhage or infarct.   CBC and BMP were unremarkable.  She was given a headache cocktail of Reglan and Benadryl and discharged home.  Headaches are still present but not as severe.  She reported temperature of 99.  They lessened on 8/24.  Headache is mild to moderate, lasting a couple of hours off and on, daily.  Sometimes she feels shaking in her head.  Reports a little bit of neck pain.     04/17/2019 MRI w wo and MRA of head: advanced widespread chronic small vessel ischemic changes as well as occlusion of right distal M1 segment and severe stenoses of left M2 branches, A2 branches and mild irregularity of the PCA branches, but no acute intracranial abnormality. 04/13/2019 Sed Rate 11.   History of CVA: She was admitted to [facility] on 08/14/18 for increased left arm numbness and weakness with left sided tremor as well as headache, dizziness with nausea and vomiting.  CT of head was personally reviewed and showed no acute findings.  MRI of brain personally reviewed and demonstrated scattered acute watershed subcortical and small cortical infarcts in the right MCA/ACA and MCA/PCA areas.  MRA of head personally reviewed showed severe stenosis or short segment occlusion of the distal right MCA M1 and proximal M2 segments as well as proximal severe stenosis or short segment occlusion of left MCA M2 segment.  Carotid doppler showed no hemodynamically significant stenosis.  2D echocardiogram showed EF 45-50%.  LDL was 77.  Hgb A1c was 6.2.  ASA 81mg  daily was switched to ASA 325mg  and Plavix 75mg  daily for 3 months with plan to subsequently continue Plavix alone.  She was continued on atorvastatin 40mg  daily.  Past medication:  tramadol, naproxen, Robaxin  PAST MEDICAL HISTORY: Past Medical History:  Diagnosis Date   Anemia    Aortic atherosclerosis (HCC)    Arthritis    Atrial fibrillation (HCC)    Cardiomyopathy (HCC)    CHF (congestive heart failure) (HCC)    Diabetes mellitus without complication (HCC)     Dyspnea    Dysrhythmia    GERD (gastroesophageal reflux disease)    Headache    Heart murmur    at birth   Hypertension    Internal hemorrhoids    Lupus    PUD (peptic ulcer disease)    Stroke (HCC) 08/13/2018   Vertigo     MEDICATIONS: Current Outpatient Medications on File Prior to Visit  Medication Sig Dispense Refill   acetaminophen (TYLENOL) 650 MG CR tablet Take 1,300 mg by mouth every 8 (eight) hours as needed for pain.     apixaban (ELIQUIS) 5 MG TABS tablet Take 1 tablet (5 mg total) by mouth 2 (two) times daily. 180 tablet 1   Cholecalciferol (VITAMIN D-3) 125 MCG (5000 UT) TABS Take 5,000 Units by mouth daily.     CINNAMON PO Take 500 mg by mouth daily.     furosemide (LASIX) 20 MG tablet TAKE 1 TABLET BY MOUTH ONCE DAILY AND MAY TAKE AN EXTRA TABLET AS NEEDED FOR SHORTNESS OF BREATH OR WEIGHT GAIN 180 tablet 0   gabapentin (NEURONTIN) 800 MG tablet TAKE 1 TABLET BY MOUTH TWICE DAILY ,EVERY  MORNING  AND  AT  BEDTIME 180 tablet 0   hydroxychloroquine (PLAQUENIL) 200 MG tablet Take 200 mg by mouth 2 (two) times daily.     losartan (COZAAR) 50 MG tablet TAKE 1 TABLET BY MOUTH EVERY MORNING 30 tablet 10   Magnesium 250 MG TABS Take 250 mg by mouth daily.     metFORMIN (GLUCOPHAGE) 500 MG tablet TAKE 1 TABLET BY MOUTH EVERY DAY AT BEDTIME 30 tablet 10   metoprolol succinate (TOPROL-XL) 50 MG 24 hr tablet Take 1 tablet (50 mg total) by mouth daily. Take with or immediately following a meal. 90 tablet 3   omeprazole (PRILOSEC) 40 MG capsule TAKE 1 CAPSULE BY MOUTH ONCE DAILY 30 capsule 10   ondansetron (ZOFRAN) 4 MG tablet TAKE 1 TABLET BY MOUTH EVERY 8 HOURS AS NEEDED FOR NAUSEA OR VOMITING 30 tablet 0   rosuvastatin (CRESTOR) 20 MG tablet TAKE ONE TABLET BY MOUTH EVERYDAY AT BEDTIME 90 tablet 3   Semaglutide, 1 MG/DOSE, 4 MG/3ML SOPN Inject 1 mg as directed once a week. 9 mL 2   sucralfate (CARAFATE) 1 g tablet Take 1 tablet (1 g total) by mouth 4 (four) times daily -  with  meals and at bedtime. As needed 40 tablet 1   traMADol (ULTRAM) 50 MG tablet Take 1 tablet (50 mg total) by mouth every 6 (six) hours as needed for moderate pain. 30 tablet 0   Vitamin A 2400 MCG (8000 UT) CAPS Take 2,400 Units by mouth daily.     No current facility-administered medications on file prior to visit.    ALLERGIES: Allergies  Allergen Reactions   Pholcodine Nausea Only   Codeine Nausea And Vomiting    ALL FORMS   Oxycodone Nausea And Vomiting    FAMILY HISTORY: Family History  Problem Relation Age of Onset   Hypertension Mother    Arthritis Mother    Heart failure Mother    Stroke Mother    Kidney disease Father  Hypertension Father    Colon cancer Neg Hx    Esophageal cancer Neg Hx    Rectal cancer Neg Hx    Stomach cancer Neg Hx       Objective:  Blood pressure (!) 118/54, pulse 82, height 5\' 3"  (1.6 m), weight 172 lb (78 kg), SpO2 98%. General: No acute distress.  Patient appears well-groomed.   Head:  Normocephalic/atraumatic Eyes:  Fundi examined but not visualized Neck: supple, no paraspinal tenderness, full range of motion Heart:  Regular rate and rhythm Lungs:  Clear to auscultation bilaterally Back: No paraspinal tenderness Neurological Exam: alert and oriented.  Speech fluent and not dysarthric, language intact.  CN II-XII intact. Bulk and tone normal, muscle strength 5/5 throughout.  Sensation to pinprick and vibration intact.  Deep tendon reflexes 2+ throughout, toes downgoing.  Finger to nose testing intact.  Gait mildly wide-based.  Romberg with sway.   Shon Millet, DO  CC: Abbe Amsterdam, MD

## 2023-08-14 ENCOUNTER — Encounter: Payer: Self-pay | Admitting: Neurology

## 2023-08-14 ENCOUNTER — Telehealth: Payer: Self-pay | Admitting: Neurology

## 2023-08-14 ENCOUNTER — Ambulatory Visit (INDEPENDENT_AMBULATORY_CARE_PROVIDER_SITE_OTHER): Payer: 59 | Admitting: Neurology

## 2023-08-14 VITALS — BP 118/54 | HR 82 | Ht 63.0 in | Wt 172.0 lb

## 2023-08-14 DIAGNOSIS — Z8673 Personal history of transient ischemic attack (TIA), and cerebral infarction without residual deficits: Secondary | ICD-10-CM

## 2023-08-14 DIAGNOSIS — R42 Dizziness and giddiness: Secondary | ICD-10-CM | POA: Diagnosis not present

## 2023-08-14 DIAGNOSIS — I6523 Occlusion and stenosis of bilateral carotid arteries: Secondary | ICD-10-CM

## 2023-08-14 NOTE — Telephone Encounter (Signed)
Pt called in stating she is claustrophobic and her scan with [facility] is scheduled for 09/09/23. She is wondering if Dr. Everlena Cooper can prescribe her something to help her with that? Pt's pharmacy [facility].

## 2023-08-14 NOTE — Telephone Encounter (Signed)
Please call closer to the visit date. Script may not be held that long.

## 2023-08-14 NOTE — Patient Instructions (Signed)
Check CTA head and neck Continue gabapentin 800mg  twice daily

## 2023-08-21 DIAGNOSIS — Z79899 Other long term (current) drug therapy: Secondary | ICD-10-CM | POA: Diagnosis not present

## 2023-08-21 DIAGNOSIS — R945 Abnormal results of liver function studies: Secondary | ICD-10-CM | POA: Diagnosis not present

## 2023-08-21 DIAGNOSIS — M79674 Pain in right toe(s): Secondary | ICD-10-CM | POA: Diagnosis not present

## 2023-08-21 DIAGNOSIS — M1991 Primary osteoarthritis, unspecified site: Secondary | ICD-10-CM | POA: Diagnosis not present

## 2023-08-21 DIAGNOSIS — M329 Systemic lupus erythematosus, unspecified: Secondary | ICD-10-CM | POA: Diagnosis not present

## 2023-08-28 ENCOUNTER — Encounter (INDEPENDENT_AMBULATORY_CARE_PROVIDER_SITE_OTHER): Payer: Self-pay | Admitting: Family Medicine

## 2023-08-28 ENCOUNTER — Telehealth: Payer: Self-pay | Admitting: Family Medicine

## 2023-08-28 ENCOUNTER — Other Ambulatory Visit: Payer: Self-pay | Admitting: Family Medicine

## 2023-08-28 DIAGNOSIS — R1013 Epigastric pain: Secondary | ICD-10-CM

## 2023-08-28 MED ORDER — SUCRALFATE 1 G PO TABS: 1.0000 g | ORAL_TABLET | Freq: Three times a day (TID) | ORAL | 1 refills | Status: DC

## 2023-08-28 NOTE — Telephone Encounter (Signed)

## 2023-08-28 NOTE — Telephone Encounter (Signed)
 Patient is requesting a 90 days supply are a 100 day  supply of the  rosuvaspatin 20mg 

## 2023-08-28 NOTE — Addendum Note (Signed)
 Addended by: Gates Kasal C on: 08/28/2023 02:26 PM   Modules accepted: Orders

## 2023-08-28 NOTE — Telephone Encounter (Signed)
 Copied from CRM [id number]. Topic: Clinical - Medication Refill >> Aug 28, 2023  3:46 PM Deidre DASEN wrote: Most Recent Primary Care Visit:  Provider: WATT HARLENE BROCKS  Department: [facility]  Visit Type: OFFICE VISIT  Date: 06/22/2023  Medication:  rosuvaspatin 20mg     90 or 100 day supply  Has the patient contacted their pharmacy? No (Agent: If no, request that the patient contact the pharmacy for the refill. If patient does not wish to contact the pharmacy document the reason why and proceed with request.) (Agent: If yes, when and what did the pharmacy advise?)  Is this the correct pharmacy for this prescription? Yes If no, delete pharmacy and type the correct one.  This is the patient's preferred pharmacy:  [facility] [address], [place] - [address] [place] [address] Phone: [phone] Fax: [phone]   Has the prescription been filled recently? No  Is the patient out of the medication? Yes  Has the patient been seen for an appointment in the last year OR does the patient have an upcoming appointment? Yes  Can we respond through MyChart? No  Agent: Please be advised that Rx refills may take up to 3 business days. We ask that you follow-up with your pharmacy.

## 2023-09-03 ENCOUNTER — Telehealth: Payer: Self-pay

## 2023-09-03 ENCOUNTER — Telehealth: Payer: Self-pay | Admitting: Neurology

## 2023-09-03 NOTE — Telephone Encounter (Signed)
PT would like a phone call on diagnosis, was told by at home visit Medicare coordinator that said she has Diabetic Neuropathy.

## 2023-09-03 NOTE — Telephone Encounter (Signed)
Copied from CRM [id number]. Topic: Clinical - Medical Advice >> Sep 03, 2023  3:34 PM Jouri Threat wrote: Reason for CRM: Patient have question and concerns never been diagnois with diabetic neuropathy and patient stated she was never diagnois with this condition she need clarification , reason asking she had a benefit coordinator come to her house trying to provide her with best benefits and she was never told by her provider or any other provider that she has Diabetic neuropathy , because the benefit coordinator stated the medication patient is on and made her not eligible for the insurance she wants  Please call patient [phone]

## 2023-09-04 NOTE — Telephone Encounter (Signed)
Advised patient I'm not sure if patient has Diabetic neuropathy. Dr.Jaffe never as far as I can see in her chart had added the DX Diabetic Neuropathy.

## 2023-09-05 ENCOUNTER — Encounter: Payer: Self-pay | Admitting: Neurology

## 2023-09-07 ENCOUNTER — Other Ambulatory Visit: Payer: Self-pay | Admitting: Neurology

## 2023-09-07 ENCOUNTER — Encounter: Payer: Self-pay | Admitting: Family Medicine

## 2023-09-07 MED ORDER — DIAZEPAM 5 MG PO TABS: ORAL_TABLET | ORAL | 0 refills | Status: DC

## 2023-09-07 NOTE — Telephone Encounter (Signed)
I do not see this Dx on her list. Maybe this was something Neuro Dx her with?

## 2023-09-09 ENCOUNTER — Ambulatory Visit
Admission: RE | Admit: 2023-09-09 | Discharge: 2023-09-09 | Disposition: A | Discharge status: Home / Self Care | Payer: 59 | Source: Ambulatory Visit | Attending: Neurology

## 2023-09-09 DIAGNOSIS — R42 Dizziness and giddiness: Secondary | ICD-10-CM

## 2023-09-09 DIAGNOSIS — Z8673 Personal history of transient ischemic attack (TIA), and cerebral infarction without residual deficits: Secondary | ICD-10-CM

## 2023-09-09 DIAGNOSIS — I6523 Occlusion and stenosis of bilateral carotid arteries: Secondary | ICD-10-CM

## 2023-09-09 MED ORDER — IOPAMIDOL (ISOVUE-370) INJECTION 76%
100.0000 mL | Freq: Once | INTRAVENOUS | Status: AC | PRN
  Administered 2023-09-09: 75 mL via INTRAVENOUS

## 2023-09-11 NOTE — Progress Notes (Signed)
 Patient advised.

## 2023-09-22 NOTE — Progress Notes (Deleted)
 [facility] [address] [place], [name] [address] [phone]  Date:  09/23/2023   Name:  Amanda Saunders   DOB:  [birth year]   MRN:  [id number]  PCP:  Pearline Cables, MD    Chief Complaint: No chief complaint on file.   History of Present Illness:  Amanda Saunders is a 68 y.o. very pleasant female patient who presents with the following:  Pt seen today with concern of stomach trouble Last seen by myself in December  History of lupus, rheumatoid arthritis, cardiomyopathy, hypertension, well controlled diabetes, stroke January 2020 due to right MCA occlusion Dr Everlena Cooper is her neurologist Dr Dierdre Forth rheumatology [facility] every 30 days.  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   Systemic lupus erythematosus (HCC) 08/04/2019   Acute ischemic stroke (HCC) 08/14/2018   HTN (hypertension) 08/14/2018   Controlled type 2 diabetes mellitus without complication, without long-term current use of insulin (HCC) 04/03/2016   History of CVA (cerebrovascular accident) 03/12/2016   Chest pain at rest 03/12/2016   Peripheral edema 03/12/2016    Past Medical History:  Diagnosis Date   Anemia    Aortic atherosclerosis (HCC)    Arthritis    Atrial fibrillation (HCC)    Cardiomyopathy (HCC)    CHF (congestive heart failure) (HCC)    Diabetes mellitus without complication (HCC)    Dyspnea    Dysrhythmia    GERD (gastroesophageal reflux  disease)    Headache    Heart murmur    at birth   Hypertension    Internal hemorrhoids    Lupus    PUD (peptic ulcer disease)    Stroke (HCC) 08/13/2018   Vertigo     Past Surgical History:  Procedure Laterality Date   BACK SURGERY     CARDIOVERSION N/A 07/03/2022   Procedure: CARDIOVERSION;  Surgeon: Little Ishikawa, MD;  Location: [facility] ENDOSCOPY;  Service: Cardiovascular;  Laterality: N/A;   CARPAL TUNNEL RELEASE Bilateral    COLONOSCOPY     IRRIGATION AND DEBRIDEMENT KNEE Left 03/04/2023   Procedure: Left knee irrigation and debridement, wound closure;  Surgeon: Samson Frederic, MD;  Location: [facility] ORS;  Service: Orthopedics;  Laterality: Left;   KNEE ARTHROPLASTY Left 12/17/2022   Procedure: COMPUTER ASSISTED TOTAL KNEE ARTHROPLASTY;  Surgeon: Samson Frederic, MD;  Location: [facility] ORS;  Service: Orthopedics;  Laterality: Left;   KNEE CARTILAGE SURGERY Left    MENISECTOMY Left    NECK SURGERY     2015   UPPER GASTROINTESTINAL ENDOSCOPY      Social History   Tobacco Use   Smoking status: Former    Current packs/day: 0.00    Types: Cigarettes    Quit date: 07/21/2002    Years since quitting: 21.1   Smokeless tobacco: Never  Vaping Use   Vaping status: Never Used  Substance Use Topics   Alcohol use: Not Currently    Comment: occasional   Drug use: No  Family History  Problem Relation Age of Onset   Hypertension Mother    Arthritis Mother    Heart failure Mother    Stroke Mother    Kidney disease Father    Hypertension Father    Colon cancer Neg Hx    Esophageal cancer Neg Hx    Rectal cancer Neg Hx    Stomach cancer Neg Hx     Allergies  Allergen Reactions   Pholcodine Nausea Only   Codeine Nausea And Vomiting    ALL FORMS   Oxycodone Nausea And Vomiting    Medication list has been reviewed and updated.  Current Outpatient Medications on File Prior to Visit  Medication Sig Dispense Refill   acetaminophen (TYLENOL) 650 MG CR tablet Take 1,300  mg by mouth every 8 (eight) hours as needed for pain.     apixaban (ELIQUIS) 5 MG TABS tablet Take 1 tablet (5 mg total) by mouth 2 (two) times daily. 180 tablet 1   Cholecalciferol (VITAMIN D-3) 125 MCG (5000 UT) TABS Take 5,000 Units by mouth daily.     CINNAMON PO Take 500 mg by mouth daily.     diazepam (VALIUM) 5 MG tablet Take 30-40 minutes prior to MRI. 1 tablet 0   furosemide (LASIX) 20 MG tablet TAKE 1 TABLET BY MOUTH ONCE DAILY AND MAY TAKE AN EXTRA TABLET AS NEEDED FOR SHORTNESS OF BREATH OR WEIGHT GAIN 180 tablet 0   gabapentin (NEURONTIN) 800 MG tablet TAKE 1 TABLET BY MOUTH TWICE DAILY ,EVERY  MORNING  AND  AT  BEDTIME 180 tablet 0   hydroxychloroquine (PLAQUENIL) 200 MG tablet Take 200 mg by mouth 2 (two) times daily.     losartan (COZAAR) 50 MG tablet TAKE 1 TABLET BY MOUTH EVERY MORNING 30 tablet 10   Magnesium 250 MG TABS Take 250 mg by mouth daily.     metFORMIN (GLUCOPHAGE) 500 MG tablet TAKE 1 TABLET BY MOUTH EVERY DAY AT BEDTIME 30 tablet 10   metoprolol succinate (TOPROL-XL) 50 MG 24 hr tablet Take 1 tablet (50 mg total) by mouth daily. Take with or immediately following a meal. 90 tablet 3   omeprazole (PRILOSEC) 40 MG capsule TAKE 1 CAPSULE BY MOUTH ONCE DAILY 30 capsule 10   ondansetron (ZOFRAN) 4 MG tablet TAKE 1 TABLET BY MOUTH EVERY 8 HOURS AS NEEDED FOR NAUSEA OR VOMITING 30 tablet 0   rosuvastatin (CRESTOR) 20 MG tablet TAKE ONE TABLET BY MOUTH EVERYDAY AT BEDTIME 90 tablet 3   Semaglutide, 1 MG/DOSE, 4 MG/3ML SOPN Inject 1 mg as directed once a week. 9 mL 2   sucralfate (CARAFATE) 1 g tablet Take 1 tablet (1 g total) by mouth 4 (four) times daily -  with meals and at bedtime. As needed 40 tablet 1   traMADol (ULTRAM) 50 MG tablet Take 1 tablet (50 mg total) by mouth every 6 (six) hours as needed for moderate pain. 30 tablet 0   Vitamin A 2400 MCG (8000 UT) CAPS Take 2,400 Units by mouth daily.     No current facility-administered medications on file prior to visit.     Review of Systems:  As per HPI- otherwise negative.   Physical Examination: There were no vitals filed for this visit. There were no vitals filed for this visit. There is no height or weight on file to calculate BMI. Ideal Body Weight:    GEN: no acute distress. HEENT: Atraumatic, Normocephalic.  Ears and Nose: No external deformity. CV: RRR, No  M/G/R. No JVD. No thrill. No extra heart sounds. PULM: CTA B, no wheezes, crackles, rhonchi. No retractions. No resp. distress. No accessory muscle use. ABD: S, NT, ND, +BS. No rebound. No HSM. EXTR: No c/c/e PSYCH: Normally interactive. Conversant.    Assessment and Plan: ***  Signed Abbe Amsterdam, MD

## 2023-09-23 ENCOUNTER — Ambulatory Visit: Admitting: Family Medicine

## 2023-09-23 NOTE — Progress Notes (Deleted)
 [facility] at [facility] [address] [place], [name] [address] [phone]  Date:  09/24/2023   Name:  Amanda Saunders   DOB:  10-Sep-1955   MRN:  [id number]  PCP:  Pearline Cables, MD    Chief Complaint: No chief complaint on file.   History of Present Illness:  Amanda Saunders is a 68 y.o. very pleasant female patient who presents with the following:  Pt seen today with concern of stomach trouble Last seen by myself in December  History of lupus, rheumatoid arthritis, cardiomyopathy, hypertension, well controlled diabetes, stroke January 2020 due to right MCA occlusion Dr Everlena Cooper is her neurologist Dr Dierdre Forth rheumatology [facility] every 30 days.  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   Systemic lupus erythematosus (HCC) 08/04/2019   Acute ischemic stroke (HCC) 08/14/2018   HTN (hypertension) 08/14/2018   Controlled type 2 diabetes mellitus without complication, without long-term current use of insulin (HCC) 04/03/2016   History of CVA (cerebrovascular accident) 03/12/2016   Chest pain at rest 03/12/2016   Peripheral edema 03/12/2016    Past Medical History:  Diagnosis Date   Anemia    Aortic atherosclerosis (HCC)    Arthritis    Atrial fibrillation (HCC)    Cardiomyopathy (HCC)    CHF (congestive heart failure) (HCC)    Diabetes mellitus without complication (HCC)    Dyspnea    Dysrhythmia    GERD (gastroesophageal reflux  disease)    Headache    Heart murmur    at birth   Hypertension    Internal hemorrhoids    Lupus    PUD (peptic ulcer disease)    Stroke (HCC) 08/13/2018   Vertigo     Past Surgical History:  Procedure Laterality Date   BACK SURGERY     CARDIOVERSION N/A 07/03/2022   Procedure: CARDIOVERSION;  Surgeon: Little Ishikawa, MD;  Location: [facility] ENDOSCOPY;  Service: Cardiovascular;  Laterality: N/A;   CARPAL TUNNEL RELEASE Bilateral    COLONOSCOPY     IRRIGATION AND DEBRIDEMENT KNEE Left 03/04/2023   Procedure: Left knee irrigation and debridement, wound closure;  Surgeon: Samson Frederic, MD;  Location: [facility];  Service: Orthopedics;  Laterality: Left;   KNEE ARTHROPLASTY Left 12/17/2022   Procedure: COMPUTER ASSISTED TOTAL KNEE ARTHROPLASTY;  Surgeon: Samson Frederic, MD;  Location: [facility];  Service: Orthopedics;  Laterality: Left;   KNEE CARTILAGE SURGERY Left    MENISECTOMY Left    NECK SURGERY     2015   UPPER GASTROINTESTINAL ENDOSCOPY      Social History   Tobacco Use   Smoking status: Former    Current packs/day: 0.00    Types: Cigarettes    Quit date: 07/21/2002    Years since quitting: 21.1   Smokeless tobacco: Never  Vaping Use   Vaping status: Never Used  Substance Use Topics   Alcohol use: Not Currently    Comment: occasional   Drug use: No  Family History  Problem Relation Age of Onset   Hypertension Mother    Arthritis Mother    Heart failure Mother    Stroke Mother    Kidney disease Father    Hypertension Father    Colon cancer Neg Hx    Esophageal cancer Neg Hx    Rectal cancer Neg Hx    Stomach cancer Neg Hx     Allergies  Allergen Reactions   Pholcodine Nausea Only   Codeine Nausea And Vomiting    ALL FORMS   Oxycodone Nausea And Vomiting    Medication list has been reviewed and updated.  Current Outpatient Medications on File Prior to Visit  Medication Sig Dispense Refill   acetaminophen (TYLENOL) 650 MG CR tablet Take 1,300  mg by mouth every 8 (eight) hours as needed for pain.     apixaban (ELIQUIS) 5 MG TABS tablet Take 1 tablet (5 mg total) by mouth 2 (two) times daily. 180 tablet 1   Cholecalciferol (VITAMIN D-3) 125 MCG (5000 UT) TABS Take 5,000 Units by mouth daily.     CINNAMON PO Take 500 mg by mouth daily.     diazepam (VALIUM) 5 MG tablet Take 30-40 minutes prior to MRI. 1 tablet 0   furosemide (LASIX) 20 MG tablet TAKE 1 TABLET BY MOUTH ONCE DAILY AND MAY TAKE AN EXTRA TABLET AS NEEDED FOR SHORTNESS OF BREATH OR WEIGHT GAIN 180 tablet 0   gabapentin (NEURONTIN) 800 MG tablet TAKE 1 TABLET BY MOUTH TWICE DAILY ,EVERY  MORNING  AND  AT  BEDTIME 180 tablet 0   hydroxychloroquine (PLAQUENIL) 200 MG tablet Take 200 mg by mouth 2 (two) times daily.     losartan (COZAAR) 50 MG tablet TAKE 1 TABLET BY MOUTH EVERY MORNING 30 tablet 10   Magnesium 250 MG TABS Take 250 mg by mouth daily.     metFORMIN (GLUCOPHAGE) 500 MG tablet TAKE 1 TABLET BY MOUTH EVERY DAY AT BEDTIME 30 tablet 10   metoprolol succinate (TOPROL-XL) 50 MG 24 hr tablet Take 1 tablet (50 mg total) by mouth daily. Take with or immediately following a meal. 90 tablet 3   omeprazole (PRILOSEC) 40 MG capsule TAKE 1 CAPSULE BY MOUTH ONCE DAILY 30 capsule 10   ondansetron (ZOFRAN) 4 MG tablet TAKE 1 TABLET BY MOUTH EVERY 8 HOURS AS NEEDED FOR NAUSEA OR VOMITING 30 tablet 0   rosuvastatin (CRESTOR) 20 MG tablet TAKE ONE TABLET BY MOUTH EVERYDAY AT BEDTIME 90 tablet 3   Semaglutide, 1 MG/DOSE, 4 MG/3ML SOPN Inject 1 mg as directed once a week. 9 mL 2   sucralfate (CARAFATE) 1 g tablet Take 1 tablet (1 g total) by mouth 4 (four) times daily -  with meals and at bedtime. As needed 40 tablet 1   traMADol (ULTRAM) 50 MG tablet Take 1 tablet (50 mg total) by mouth every 6 (six) hours as needed for moderate pain. 30 tablet 0   Vitamin A 2400 MCG (8000 UT) CAPS Take 2,400 Units by mouth daily.     No current facility-administered medications on file prior to visit.     Review of Systems:  As per HPI- otherwise negative.   Physical Examination: There were no vitals filed for this visit. There were no vitals filed for this visit. There is no height or weight on file to calculate BMI. Ideal Body Weight:    GEN: no acute distress. HEENT: Atraumatic, Normocephalic.  Ears and Nose: No external deformity. CV: RRR, No  M/G/R. No JVD. No thrill. No extra heart sounds. PULM: CTA B, no wheezes, crackles, rhonchi. No retractions. No resp. distress. No accessory muscle use. ABD: S, NT, ND, +BS. No rebound. No HSM. EXTR: No c/c/e PSYCH: Normally interactive. Conversant.    Assessment and Plan: ***  Signed Abbe Amsterdam, MD

## 2023-09-24 ENCOUNTER — Ambulatory Visit: Admitting: Family Medicine

## 2023-09-25 ENCOUNTER — Other Ambulatory Visit: Payer: Self-pay

## 2023-09-25 DIAGNOSIS — R1013 Epigastric pain: Secondary | ICD-10-CM

## 2023-09-25 MED ORDER — ROSUVASTATIN CALCIUM 20 MG PO TABS: 20.0000 mg | ORAL_TABLET | Freq: Every day | ORAL | 0 refills | Status: DC

## 2023-10-01 ENCOUNTER — Telehealth: Payer: Self-pay | Admitting: Family Medicine

## 2023-10-01 DIAGNOSIS — M329 Systemic lupus erythematosus, unspecified: Secondary | ICD-10-CM | POA: Diagnosis not present

## 2023-10-01 NOTE — Telephone Encounter (Signed)
 Copied from CRM [id number]. Topic: General - Other >> Oct 01, 2023  8:35 AM Almira Coaster wrote: Reason for CRM: Patient is calling because she received a letter stating she missed her appointment on March 6,2025. She states that she called prior to have the appointment cancelled.

## 2023-10-11 NOTE — Progress Notes (Deleted)
 [facility] at [facility] [address] [place], [name] [address] [phone]  Date:  10/12/2023   Name:  Amanda Saunders   DOB:  March 12, 1956   MRN:  [id number]  PCP:  Pearline Cables, MD    Chief Complaint: No chief complaint on file.   History of Present Illness:  Amanda Saunders is a 68 y.o. very pleasant female patient who presents with the following:  Patient seen today for follow-up visit.  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  Patient notes her RA symptoms are stable  We have been following her hemoglobin which dropped after her knee surgery last May.  Most recently checked in December, hemoglobin 11.8 at that time  Mammogram Shingrix Can offer flu shot if not done  CBC was checked in December, otherwise most recent labs in October  Lab Results  Component Value Date   HGBA1C 6.4 05/06/2023   Eliquis 5 twice daily Lasix 20 daily Gabapentin Losartan 50 Metformin 500 daily Ozempic 1 mg  Toprol XL 50 Plaquenil   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   Systemic lupus erythematosus (HCC) 08/04/2019   Acute ischemic stroke (HCC) 08/14/2018   HTN (hypertension) 08/14/2018   Controlled type 2 diabetes mellitus without complication, without long-term current use of insulin (HCC) 04/03/2016   History of CVA (cerebrovascular accident) 03/12/2016   Chest pain at  rest 03/12/2016   Peripheral edema 03/12/2016    Past Medical History:  Diagnosis Date   Anemia    Aortic atherosclerosis (HCC)    Arthritis    Atrial fibrillation (HCC)    Cardiomyopathy (HCC)    CHF (congestive heart failure) (HCC)    Diabetes mellitus without complication (HCC)    Dyspnea    Dysrhythmia    GERD (gastroesophageal reflux disease)    Headache    Heart murmur    at birth   Hypertension    Internal hemorrhoids    Lupus    PUD (peptic ulcer disease)    Stroke (HCC) 08/13/2018   Vertigo     Past Surgical History:  Procedure Laterality Date   BACK SURGERY     CARDIOVERSION N/A 07/03/2022   Procedure: CARDIOVERSION;  Surgeon: Little Ishikawa, MD;  Location: [facility] ENDOSCOPY;  Service: Cardiovascular;  Laterality: N/A;   CARPAL TUNNEL RELEASE Bilateral    COLONOSCOPY     IRRIGATION AND DEBRIDEMENT KNEE Left 03/04/2023   Procedure: Left knee irrigation and debridement, wound closure;  Surgeon: Samson Frederic, MD;  Location: [facility] ORS;  Service: Orthopedics;  Laterality: Left;   KNEE ARTHROPLASTY Left 12/17/2022   Procedure: COMPUTER ASSISTED TOTAL KNEE ARTHROPLASTY;  Surgeon: Samson Frederic, MD;  Location: [facility] ORS;  Service: Orthopedics;  Laterality: Left;   KNEE CARTILAGE SURGERY Left    MENISECTOMY Left    NECK SURGERY     2015   UPPER GASTROINTESTINAL ENDOSCOPY      Social History   Tobacco Use   Smoking status:  Former    Current packs/day: 0.00    Types: Cigarettes    Quit date: 07/21/2002    Years since quitting: 21.2   Smokeless tobacco: Never  Vaping Use   Vaping status: Never Used  Substance Use Topics   Alcohol use: Not Currently    Comment: occasional   Drug use: No    Family History  Problem Relation Age of Onset   Hypertension Mother    Arthritis Mother    Heart failure Mother    Stroke Mother    Kidney disease Father    Hypertension Father    Colon cancer Neg Hx    Esophageal cancer Neg Hx    Rectal cancer Neg Hx    Stomach  cancer Neg Hx     Allergies  Allergen Reactions   Pholcodine Nausea Only   Codeine Nausea And Vomiting    ALL FORMS   Oxycodone Nausea And Vomiting    Medication list has been reviewed and updated.  Current Outpatient Medications on File Prior to Visit  Medication Sig Dispense Refill   acetaminophen (TYLENOL) 650 MG CR tablet Take 1,300 mg by mouth every 8 (eight) hours as needed for pain.     apixaban (ELIQUIS) 5 MG TABS tablet Take 1 tablet (5 mg total) by mouth 2 (two) times daily. 180 tablet 1   Cholecalciferol (VITAMIN D-3) 125 MCG (5000 UT) TABS Take 5,000 Units by mouth daily.     CINNAMON PO Take 500 mg by mouth daily.     diazepam (VALIUM) 5 MG tablet Take 30-40 minutes prior to MRI. 1 tablet 0   furosemide (LASIX) 20 MG tablet TAKE 1 TABLET BY MOUTH ONCE DAILY AND MAY TAKE AN EXTRA TABLET AS NEEDED FOR SHORTNESS OF BREATH OR WEIGHT GAIN 180 tablet 0   gabapentin (NEURONTIN) 800 MG tablet TAKE 1 TABLET BY MOUTH TWICE DAILY ,EVERY  MORNING  AND  AT  BEDTIME 180 tablet 0   hydroxychloroquine (PLAQUENIL) 200 MG tablet Take 200 mg by mouth 2 (two) times daily.     losartan (COZAAR) 50 MG tablet TAKE 1 TABLET BY MOUTH EVERY MORNING 30 tablet 10   Magnesium 250 MG TABS Take 250 mg by mouth daily.     metFORMIN (GLUCOPHAGE) 500 MG tablet TAKE 1 TABLET BY MOUTH EVERY DAY AT BEDTIME 30 tablet 10   metoprolol succinate (TOPROL-XL) 50 MG 24 hr tablet Take 1 tablet (50 mg total) by mouth daily. Take with or immediately following a meal. 90 tablet 3   omeprazole (PRILOSEC) 40 MG capsule TAKE 1 CAPSULE BY MOUTH ONCE DAILY 30 capsule 10   ondansetron (ZOFRAN) 4 MG tablet TAKE 1 TABLET BY MOUTH EVERY 8 HOURS AS NEEDED FOR NAUSEA OR VOMITING 30 tablet 0   rosuvastatin (CRESTOR) 20 MG tablet Take 1 tablet (20 mg total) by mouth daily. 90 tablet 0   Semaglutide, 1 MG/DOSE, 4 MG/3ML SOPN Inject 1 mg as directed once a week. 9 mL 2   sucralfate (CARAFATE) 1 g tablet Take 1 tablet (1 g total) by  mouth 4 (four) times daily -  with meals and at bedtime. As needed 40 tablet 1   traMADol (ULTRAM) 50 MG tablet Take 1 tablet (50 mg total) by mouth every 6 (six) hours as needed for moderate pain. 30 tablet 0   Vitamin A 2400 MCG (8000 UT) CAPS Take 2,400 Units by mouth daily.     No current facility-administered medications on file prior to visit.  Review of Systems:  As per HPI- otherwise negative.   Physical Examination: There were no vitals filed for this visit. There were no vitals filed for this visit. There is no height or weight on file to calculate BMI. Ideal Body Weight:    GEN: no acute distress. HEENT: Atraumatic, Normocephalic.  Ears and Nose: No external deformity. CV: RRR, No M/G/R. No JVD. No thrill. No extra heart sounds. PULM: CTA B, no wheezes, crackles, rhonchi. No retractions. No resp. distress. No accessory muscle use. ABD: S, NT, ND, +BS. No rebound. No HSM. EXTR: No c/c/e PSYCH: Normally interactive. Conversant.    Assessment and Plan: ***  Signed Abbe Amsterdam, MD

## 2023-10-12 ENCOUNTER — Ambulatory Visit: Admitting: Family Medicine

## 2023-10-13 ENCOUNTER — Other Ambulatory Visit: Payer: Self-pay

## 2023-10-13 DIAGNOSIS — I5032 Chronic diastolic (congestive) heart failure: Secondary | ICD-10-CM

## 2023-10-13 MED ORDER — FUROSEMIDE 20 MG PO TABS: ORAL_TABLET | ORAL | 0 refills | Status: DC

## 2023-10-29 DIAGNOSIS — M329 Systemic lupus erythematosus, unspecified: Secondary | ICD-10-CM | POA: Diagnosis not present

## 2023-10-31 ENCOUNTER — Encounter: Payer: Self-pay | Admitting: Family Medicine

## 2023-10-31 DIAGNOSIS — R110 Nausea: Secondary | ICD-10-CM

## 2023-11-01 MED ORDER — ONDANSETRON HCL 4 MG PO TABS: 4.0000 mg | ORAL_TABLET | Freq: Three times a day (TID) | ORAL | 0 refills | Status: DC | PRN

## 2023-11-01 NOTE — Patient Instructions (Incomplete)
 Good to see you today Recommend the shingrix vaccine series if not done already Please cut your losartan down to a 1/2 tablet and let me know how you do on the lower dose - your BP is a bit low which may be contributing to your symtoms I will be in touch with your labs asap

## 2023-11-01 NOTE — Progress Notes (Unsigned)
 [facility] at [facility] [address] [place], [name] [address] [phone]  Date:  11/05/2023   Name:  Amanda Saunders   DOB:  February 04, 1956   MRN:  [id number]  PCP:  Kaylee Partridge, MD    Chief Complaint: No chief complaint on file.   History of Present Illness:  Amanda Saunders is a 68 y.o. 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   Systemic lupus erythematosus (HCC) 08/04/2019   Acute ischemic stroke (HCC) 08/14/2018   HTN (hypertension) 08/14/2018   Controlled type 2 diabetes mellitus without complication, without long-term current use of insulin (HCC) 04/03/2016   History of CVA (cerebrovascular accident) 03/12/2016   Chest pain at rest 03/12/2016   Peripheral edema 03/12/2016    Past Medical History:  Diagnosis Date   Anemia    Aortic atherosclerosis (HCC)    Arthritis    Atrial fibrillation (HCC)    Cardiomyopathy (HCC)    CHF (congestive heart failure) (HCC)    Diabetes mellitus without complication (HCC)    Dyspnea    Dysrhythmia     GERD (gastroesophageal reflux disease)    Headache    Heart murmur    at birth   Hypertension    Internal hemorrhoids    Lupus    PUD (peptic ulcer disease)    Stroke (HCC) 08/13/2018   Vertigo     Past Surgical History:  Procedure Laterality Date   BACK SURGERY     CARDIOVERSION N/A 07/03/2022   Procedure: CARDIOVERSION;  Surgeon: Wendie Hamburg, MD;  Location: [facility] ENDOSCOPY;  Service: Cardiovascular;  Laterality: N/A;   CARPAL TUNNEL RELEASE Bilateral    COLONOSCOPY     IRRIGATION AND DEBRIDEMENT KNEE Left 03/04/2023   Procedure: Left knee irrigation and debridement, wound closure;  Surgeon: Adonica Hoose, MD;  Location: [facility];  Service: Orthopedics;  Laterality: Left;   KNEE ARTHROPLASTY Left 12/17/2022   Procedure: COMPUTER ASSISTED TOTAL KNEE ARTHROPLASTY;  Surgeon: Adonica Hoose, MD;  Location: [facility];  Service: Orthopedics;  Laterality: Left;   KNEE CARTILAGE SURGERY Left    MENISECTOMY Left    NECK SURGERY     2015   UPPER GASTROINTESTINAL ENDOSCOPY      Social History   Tobacco Use   Smoking status: Former    Current packs/day: 0.00    Types: Cigarettes    Quit date: 07/21/2002    Years since quitting: 21.2   Smokeless tobacco: Never  Vaping Use   Vaping status: Never Used  Substance Use Topics   Alcohol use: Not Currently    Comment: occasional   Drug use: No  Family History  Problem Relation Age of Onset   Hypertension Mother    Arthritis Mother    Heart failure Mother    Stroke Mother    Kidney disease Father    Hypertension Father    Colon cancer Neg Hx    Esophageal cancer Neg Hx    Rectal cancer Neg Hx    Stomach cancer Neg Hx     Allergies  Allergen Reactions   Pholcodine Nausea Only   Codeine Nausea And Vomiting    ALL FORMS   Oxycodone Nausea And Vomiting    Medication list has been reviewed and updated.  Current Outpatient Medications on File Prior to Visit  Medication Sig Dispense Refill   acetaminophen  (TYLENOL) 650 MG CR tablet Take 1,300 mg by mouth every 8 (eight) hours as needed for pain.     apixaban (ELIQUIS) 5 MG TABS tablet Take 1 tablet (5 mg total) by mouth 2 (two) times daily. 180 tablet 1   Cholecalciferol (VITAMIN D-3) 125 MCG (5000 UT) TABS Take 5,000 Units by mouth daily.     CINNAMON PO Take 500 mg by mouth daily.     diazepam (VALIUM) 5 MG tablet Take 30-40 minutes prior to MRI. 1 tablet 0   furosemide (LASIX) 20 MG tablet TAKE 1 TABLET BY MOUTH ONCE DAILY AND MAY TAKE AN EXTRA TABLET AS NEEDED FOR SHORTNESS OF BREATH OR WEIGHT GAIN 60 tablet 0   gabapentin (NEURONTIN) 800 MG tablet TAKE 1 TABLET BY MOUTH TWICE DAILY ,EVERY  MORNING  AND  AT  BEDTIME 180 tablet 0   hydroxychloroquine (PLAQUENIL) 200 MG tablet Take 200 mg by mouth 2 (two) times daily.     losartan (COZAAR) 50 MG tablet TAKE 1 TABLET BY MOUTH EVERY MORNING 30 tablet 10   Magnesium 250 MG TABS Take 250 mg by mouth daily.     metFORMIN (GLUCOPHAGE) 500 MG tablet TAKE 1 TABLET BY MOUTH EVERY DAY AT BEDTIME 30 tablet 10   metoprolol succinate (TOPROL-XL) 50 MG 24 hr tablet Take 1 tablet (50 mg total) by mouth daily. Take with or immediately following a meal. 90 tablet 3   omeprazole (PRILOSEC) 40 MG capsule TAKE 1 CAPSULE BY MOUTH ONCE DAILY 30 capsule 10   ondansetron (ZOFRAN) 4 MG tablet Take 1 tablet (4 mg total) by mouth every 8 (eight) hours as needed. 30 tablet 0   rosuvastatin (CRESTOR) 20 MG tablet Take 1 tablet (20 mg total) by mouth daily. 90 tablet 0   Semaglutide, 1 MG/DOSE, 4 MG/3ML SOPN Inject 1 mg as directed once a week. 9 mL 2   sucralfate (CARAFATE) 1 g tablet Take 1 tablet (1 g total) by mouth 4 (four) times daily -  with meals and at bedtime. As needed 40 tablet 1   traMADol (ULTRAM) 50 MG tablet Take 1 tablet (50 mg total) by mouth every 6 (six) hours as needed for moderate pain. 30 tablet 0   Vitamin A 2400 MCG (8000 UT) CAPS Take 2,400 Units by mouth daily.     No current facility-administered  medications on file prior to visit.    Review of Systems:  As per HPI- otherwise negative.   Physical Examination: There were no vitals filed for this visit. There were no vitals filed for this visit. There is no height or weight on file to calculate BMI. Ideal Body Weight:    GEN: no acute distress. HEENT: Atraumatic, Normocephalic.  Ears and Nose: No external deformity. CV: RRR,  No M/G/R. No JVD. No thrill. No extra heart sounds. PULM: CTA B, no wheezes, crackles, rhonchi. No retractions. No resp. distress. No accessory muscle use. ABD: S, NT, ND, +BS. No rebound. No HSM. EXTR: No c/c/e PSYCH: Normally interactive. Conversant.    Assessment and Plan: ***  Signed Gates Kasal, MD

## 2023-11-05 ENCOUNTER — Other Ambulatory Visit: Payer: Self-pay | Admitting: Family Medicine

## 2023-11-05 ENCOUNTER — Encounter: Payer: Self-pay | Admitting: Family Medicine

## 2023-11-05 ENCOUNTER — Ambulatory Visit (INDEPENDENT_AMBULATORY_CARE_PROVIDER_SITE_OTHER): Admitting: Family Medicine

## 2023-11-05 VITALS — BP 118/60 | HR 70 | Temp 97.7°F | Resp 18 | Ht 69.0 in | Wt 169.8 lb

## 2023-11-05 DIAGNOSIS — D649 Anemia, unspecified: Secondary | ICD-10-CM

## 2023-11-05 DIAGNOSIS — R110 Nausea: Secondary | ICD-10-CM

## 2023-11-05 DIAGNOSIS — Z7984 Long term (current) use of oral hypoglycemic drugs: Secondary | ICD-10-CM | POA: Diagnosis not present

## 2023-11-05 DIAGNOSIS — D509 Iron deficiency anemia, unspecified: Secondary | ICD-10-CM

## 2023-11-05 DIAGNOSIS — I10 Essential (primary) hypertension: Secondary | ICD-10-CM

## 2023-11-05 DIAGNOSIS — E119 Type 2 diabetes mellitus without complications: Secondary | ICD-10-CM

## 2023-11-05 DIAGNOSIS — R079 Chest pain, unspecified: Secondary | ICD-10-CM

## 2023-11-05 DIAGNOSIS — Z1231 Encounter for screening mammogram for malignant neoplasm of breast: Secondary | ICD-10-CM

## 2023-11-05 LAB — CBC
HCT: 32 % — ABNORMAL LOW (ref 36.0–46.0)
Hemoglobin: 10.4 g/dL — ABNORMAL LOW (ref 12.0–15.0)
MCHC: 32.6 g/dL (ref 30.0–36.0)
MCV: 77.1 fl — ABNORMAL LOW (ref 78.0–100.0)
Platelets: 238 10*3/uL (ref 150.0–400.0)
RBC: 4.15 Mil/uL (ref 3.87–5.11)
RDW: 16.2 % — ABNORMAL HIGH (ref 11.5–15.5)
WBC: 5.3 10*3/uL (ref 4.0–10.5)

## 2023-11-05 LAB — HEMOGLOBIN A1C: Hgb A1c MFr Bld: 6.2 % (ref 4.6–6.5)

## 2023-11-05 LAB — COMPREHENSIVE METABOLIC PANEL WITH GFR
ALT: 20 U/L (ref 0–35)
AST: 26 U/L (ref 0–37)
Albumin: 4.3 g/dL (ref 3.5–5.2)
Alkaline Phosphatase: 87 U/L (ref 39–117)
BUN: 18 mg/dL (ref 6–23)
CO2: 32 meq/L (ref 19–32)
Calcium: 9.3 mg/dL (ref 8.4–10.5)
Chloride: 102 meq/L (ref 96–112)
Creatinine, Ser: 0.87 mg/dL (ref 0.40–1.20)
GFR: 69.33 mL/min (ref >60.00)
Glucose, Bld: 95 mg/dL (ref 70–99)
Potassium: 4.2 meq/L (ref 3.5–5.1)
Sodium: 140 meq/L (ref 135–145)
Total Bilirubin: 0.2 mg/dL (ref 0.2–1.2)
Total Protein: 7.1 g/dL (ref 6.0–8.3)

## 2023-11-05 LAB — TROPONIN I (HIGH SENSITIVITY): High Sens Troponin I: 4 ng/L (ref 2–17)

## 2023-11-09 ENCOUNTER — Encounter: Payer: Self-pay | Admitting: Family Medicine

## 2023-11-10 NOTE — Progress Notes (Unsigned)
 [facility] at [facility] [address], [address] [place], [name] [address] [phone]  Date:  11/11/2023   Name:  Amanda Saunders   DOB:  Mar 31, 1956   MRN:  [id number]  PCP:  Kaylee Partridge, MD    Chief Complaint: No chief complaint on file.   History of Present Illness:  Amanda Saunders is a 68 y.o. very pleasant female patient who presents with the following:  Patient is seen today for an EKG.  I saw her in the office last week-see note dated 4/17.  At that time she noted atypical chest pain among other concerns including nausea.  However our EKG machine was down so we were not able to complete this service  From my notes 4/17:  On further questioning patient also notes episodes of momentary sharp chest pain.  Will last for just a couple of seconds, not associated with the nausea and abdominal pain.  No relationship to exertion.  She has noticed these pains for the last couple of days.  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   Systemic lupus erythematosus (HCC) 08/04/2019   Acute ischemic stroke (HCC) 08/14/2018   HTN (hypertension) 08/14/2018   Controlled type 2 diabetes mellitus without complication, without long-term current use of insulin  (HCC) 04/03/2016   History of CVA (cerebrovascular accident) 03/12/2016   Chest pain at rest 03/12/2016   Peripheral edema 03/12/2016    Past Medical History:  Diagnosis Date   Anemia    Aortic atherosclerosis (HCC)    Arthritis    Atrial fibrillation (HCC)     Cardiomyopathy (HCC)    CHF (congestive heart failure) (HCC)    Diabetes mellitus without complication (HCC)    Dyspnea    Dysrhythmia    GERD (gastroesophageal reflux disease)    Headache    Heart murmur    at birth   Hypertension    Internal hemorrhoids    Lupus    PUD (peptic ulcer disease)    Stroke (HCC) 08/13/2018   Vertigo     Past Surgical History:  Procedure Laterality Date   BACK SURGERY     CARDIOVERSION N/A 07/03/2022   Procedure: CARDIOVERSION;  Surgeon: Wendie Hamburg, MD;  Location: [facility] ENDOSCOPY;  Service: Cardiovascular;  Laterality: N/A;   CARPAL TUNNEL RELEASE Bilateral    COLONOSCOPY     IRRIGATION AND DEBRIDEMENT KNEE Left 03/04/2023   Procedure: Left knee irrigation and debridement, wound closure;  Surgeon: Adonica Hoose, MD;  Location: [facility]S;  Service: Orthopedics;  Laterality: Left;   KNEE ARTHROPLASTY Left 12/17/2022   Procedure: COMPUTER ASSISTED TOTAL KNEE ARTHROPLASTY;  Surgeon: Adonica Hoose, MD;  Location: [facility];  Service: Orthopedics;  Laterality: Left;   KNEE CARTILAGE SURGERY Left    MENISECTOMY Left    NECK SURGERY     2015   UPPER GASTROINTESTINAL ENDOSCOPY      Social History   Tobacco Use   Smoking status: Former    Current packs/day: 0.00    Types: Cigarettes    Quit  date: 07/21/2002    Years since quitting: 21.3   Smokeless tobacco: Never  Vaping Use   Vaping status: Never Used  Substance Use Topics   Alcohol  use: Not Currently    Comment: occasional   Drug use: No    Family History  Problem Relation Age of Onset   Hypertension Mother    Arthritis Mother    Heart failure Mother    Stroke Mother    Kidney disease Father    Hypertension Father    Colon cancer Neg Hx    Esophageal cancer Neg Hx    Rectal cancer Neg Hx    Stomach cancer Neg Hx     Allergies  Allergen Reactions   Pholcodine Nausea Only   Codeine Nausea And Vomiting    ALL FORMS   Oxycodone  Nausea And Vomiting    Medication list has  been reviewed and updated.  Current Outpatient Medications on File Prior to Visit  Medication Sig Dispense Refill   acetaminophen  (TYLENOL ) 650 MG CR tablet Take 1,300 mg by mouth every 8 (eight) hours as needed for pain.     apixaban  (ELIQUIS ) 5 MG TABS tablet Take 1 tablet (5 mg total) by mouth 2 (two) times daily. 180 tablet 1   Cholecalciferol (VITAMIN D -3) 125 MCG (5000 UT) TABS Take 5,000 Units by mouth daily.     CINNAMON PO Take 500 mg by mouth daily.     diazepam  (VALIUM ) 5 MG tablet Take 30-40 minutes prior to MRI. 1 tablet 0   furosemide  (LASIX ) 20 MG tablet TAKE 1 TABLET BY MOUTH ONCE DAILY AND MAY TAKE AN EXTRA TABLET AS NEEDED FOR SHORTNESS OF BREATH OR WEIGHT GAIN 60 tablet 0   gabapentin  (NEURONTIN ) 800 MG tablet TAKE 1 TABLET BY MOUTH TWICE DAILY ,EVERY  MORNING  AND  AT  BEDTIME 180 tablet 0   hydroxychloroquine  (PLAQUENIL ) 200 MG tablet Take 200 mg by mouth 2 (two) times daily.     losartan  (COZAAR ) 50 MG tablet TAKE 1 TABLET BY MOUTH EVERY MORNING 30 tablet 10   Magnesium 250 MG TABS Take 250 mg by mouth daily.     metFORMIN  (GLUCOPHAGE ) 500 MG tablet TAKE 1 TABLET BY MOUTH EVERY DAY AT BEDTIME 30 tablet 10   metoprolol  succinate (TOPROL -XL) 50 MG 24 hr tablet Take 1 tablet (50 mg total) by mouth daily. Take with or immediately following a meal. 90 tablet 3   omeprazole  (PRILOSEC) 40 MG capsule TAKE 1 CAPSULE BY MOUTH ONCE DAILY 30 capsule 10   ondansetron  (ZOFRAN ) 4 MG tablet Take 1 tablet (4 mg total) by mouth every 8 (eight) hours as needed. 30 tablet 0   rosuvastatin  (CRESTOR ) 20 MG tablet Take 1 tablet (20 mg total) by mouth daily. 90 tablet 0   Semaglutide , 1 MG/DOSE, 4 MG/3ML SOPN Inject 1 mg as directed once a week. 9 mL 2   sucralfate  (CARAFATE ) 1 g tablet Take 1 tablet (1 g total) by mouth 4 (four) times daily -  with meals and at bedtime. As needed 40 tablet 1   traMADol  (ULTRAM ) 50 MG tablet Take 1 tablet (50 mg total) by mouth every 6 (six) hours as needed for  moderate pain. 30 tablet 0   Vitamin A 2400 MCG (8000 UT) CAPS Take 2,400 Units by mouth daily.     No current facility-administered medications on file prior to visit.    Review of Systems:  As per HPI- otherwise negative.   Physical Examination: There were no  vitals filed for this visit. There were no vitals filed for this visit. There is no height or weight on file to calculate BMI. Ideal Body Weight:    GEN: no acute distress. HEENT: Atraumatic, Normocephalic.  Ears and Nose: No external deformity. CV: RRR, No M/G/R. No JVD. No thrill. No extra heart sounds. PULM: CTA B, no wheezes, crackles, rhonchi. No retractions. No resp. distress. No accessory muscle use. ABD: S, NT, ND, +BS. No rebound. No HSM. EXTR: No c/c/e PSYCH: Normally interactive. Conversant.    Assessment and Plan: ***  Signed Gates Kasal, MD

## 2023-11-11 ENCOUNTER — Encounter: Payer: Self-pay | Admitting: Family Medicine

## 2023-11-11 ENCOUNTER — Ambulatory Visit (INDEPENDENT_AMBULATORY_CARE_PROVIDER_SITE_OTHER): Admitting: Family Medicine

## 2023-11-11 VITALS — BP 124/80 | HR 74 | Resp 18

## 2023-11-11 DIAGNOSIS — R1011 Right upper quadrant pain: Secondary | ICD-10-CM | POA: Diagnosis not present

## 2023-11-11 DIAGNOSIS — R079 Chest pain, unspecified: Secondary | ICD-10-CM

## 2023-11-11 LAB — D-DIMER, QUANTITATIVE: D-Dimer, Quant: 0.25 ug{FEU}/mL (ref <0.50)

## 2023-11-11 NOTE — Patient Instructions (Signed)
 Good to see you today, please let me know if anything is changing or getting worse.  We will check a D-dimer test today to look for any evidence of possible blood clot in the lung.  If this is positive we will get a CT scan of your chest.  In the meantime we will go ahead and get a plain chest x-ray, and set up an ultrasound to look at your gallbladder

## 2023-11-12 ENCOUNTER — Ambulatory Visit (HOSPITAL_BASED_OUTPATIENT_CLINIC_OR_DEPARTMENT_OTHER)

## 2023-11-12 ENCOUNTER — Inpatient Hospital Stay (HOSPITAL_BASED_OUTPATIENT_CLINIC_OR_DEPARTMENT_OTHER): Admission: RE | Admit: 2023-11-12 | Source: Ambulatory Visit

## 2023-11-17 ENCOUNTER — Telehealth: Payer: Self-pay | Admitting: *Deleted

## 2023-11-17 NOTE — Telephone Encounter (Signed)
 Spoke with pt, Follow up scheduled

## 2023-11-17 NOTE — Telephone Encounter (Signed)
-----   Message from Alexandria Angel sent at 11/12/2023  7:24 AM EDT ----- [name] Can you schedule APPov Alexandria Angel ----- Message ----- From: Kaylee Partridge, MD Sent: 11/11/2023   9:18 PM EDT To: Lenise Quince, MD  Hi Brian-could I ask you to bring Amanda Saunders in for cardiology follow-up?  She has seen me now twice with concern of shortness of breath and very atypical chest pain.  Troponin negative, EKG unchanged, she is staying in sinus rhythm and is still anticoagulated.  Her last stress was in 2017 so I thought she may need reevaluation.  Please let me know if there is any testing you would like me to order in the meantime  Thank you so much!  Jess

## 2023-11-19 ENCOUNTER — Other Ambulatory Visit: Payer: Self-pay | Admitting: Neurology

## 2023-11-19 ENCOUNTER — Ambulatory Visit (HOSPITAL_BASED_OUTPATIENT_CLINIC_OR_DEPARTMENT_OTHER)

## 2023-11-20 ENCOUNTER — Other Ambulatory Visit (HOSPITAL_COMMUNITY): Payer: Self-pay

## 2023-11-20 ENCOUNTER — Telehealth: Payer: Self-pay

## 2023-11-20 NOTE — Telephone Encounter (Signed)
 Pharmacy Patient Advocate Encounter   Received notification from CoverMyMeds that prior authorization for Omeprazole  40MG  dr capsules is required/requested.   Insurance verification completed.   The patient is insured through [facility] .   Per test claim: PA required; PA submitted to above mentioned insurance via CoverMyMeds Key/confirmation #/EOC [id number] Status is pending

## 2023-11-23 ENCOUNTER — Other Ambulatory Visit (HOSPITAL_COMMUNITY): Payer: Self-pay

## 2023-11-23 NOTE — Telephone Encounter (Signed)
 Pharmacy Patient Advocate Encounter  Received notification from [employer] that Prior Authorization for Omeprazole  40MG  dr capsules has been CANCELLED due to   PA #/Case ID/Reference #: [id number]    Refill too soon. Last filled 11/21/2023. Next refill due 12/14/2023. No further PA needed at this time.

## 2023-11-24 ENCOUNTER — Telehealth: Payer: Self-pay | Admitting: Family Medicine

## 2023-11-24 ENCOUNTER — Ambulatory Visit (HOSPITAL_BASED_OUTPATIENT_CLINIC_OR_DEPARTMENT_OTHER)

## 2023-11-24 NOTE — Telephone Encounter (Signed)
 Copied from CRM [id number]. Topic: Appointments - Scheduling Inquiry for Clinic >> Nov 24, 2023  3:04 PM Kita Perish H wrote: Reason for CRM: Patient states provider wants her to have an EKG, please reach out for scheduling.Cain Castillo [phone]

## 2023-11-26 NOTE — Telephone Encounter (Signed)
 Pt is scheduled for 12/02/23- is this okay?

## 2023-11-27 NOTE — Progress Notes (Deleted)
 [facility] at [facility] [address], [address] [place], [name] [address] [phone]  Date:  12/02/2023   Name:  Amanda Saunders   DOB:  1955/10/22   MRN:  [id number]  PCP:  Kaylee Partridge, MD    Chief Complaint: No chief complaint on file.   History of Present Illness:  Amanda Saunders is a 68 y.o. very pleasant female patient who presents with the following:  Patient seen today for an EKG.  I saw her on April 23 with concern for chest pain.  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   Systemic lupus erythematosus (HCC) 08/04/2019   Acute ischemic stroke (HCC) 08/14/2018   HTN (hypertension) 08/14/2018   Controlled type 2 diabetes mellitus without complication, without long-term current use of insulin  (HCC) 04/03/2016   History of CVA (cerebrovascular accident) 03/12/2016   Chest pain at rest 03/12/2016   Peripheral edema 03/12/2016    Past Medical History:  Diagnosis Date   Anemia    Aortic atherosclerosis (HCC)    Arthritis    Atrial fibrillation (HCC)    Cardiomyopathy (HCC)    CHF (congestive heart failure) (HCC)    Diabetes mellitus without complication (HCC)    Dyspnea    Dysrhythmia    GERD (gastroesophageal reflux disease)    Headache    Heart murmur    at birth   Hypertension    Internal hemorrhoids    Lupus    PUD (peptic ulcer disease)    Stroke (HCC) 08/13/2018   Vertigo     Past Surgical History:  Procedure Laterality Date   BACK SURGERY      CARDIOVERSION N/A 07/03/2022   Procedure: CARDIOVERSION;  Surgeon: Wendie Hamburg, MD;  Location: [facility] ENDOSCOPY;  Service: Cardiovascular;  Laterality: N/A;   CARPAL TUNNEL RELEASE Bilateral    COLONOSCOPY     IRRIGATION AND DEBRIDEMENT KNEE Left 03/04/2023   Procedure: Left knee irrigation and debridement, wound closure;  Surgeon: Adonica Hoose, MD;  Location: [facility] ORS;  Service: Orthopedics;  Laterality: Left;   KNEE ARTHROPLASTY Left 12/17/2022   Procedure: COMPUTER ASSISTED TOTAL KNEE ARTHROPLASTY;  Surgeon: Adonica Hoose, MD;  Location: [facility] ORS;  Service: Orthopedics;  Laterality: Left;   KNEE CARTILAGE SURGERY Left    MENISECTOMY Left    NECK SURGERY     2015   UPPER GASTROINTESTINAL ENDOSCOPY      Social History   Tobacco Use   Smoking status: Former    Current packs/day: 0.00    Types: Cigarettes    Quit date: 07/21/2002    Years since quitting: 21.3   Smokeless tobacco: Never  Vaping Use   Vaping status: Never Used  Substance Use Topics   Alcohol  use: Not Currently    Comment: occasional   Drug use: No    Family History  Problem Relation Age of Onset   Hypertension Mother    Arthritis Mother    Heart failure Mother    Stroke Mother    Kidney  disease Father    Hypertension Father    Colon cancer Neg Hx    Esophageal cancer Neg Hx    Rectal cancer Neg Hx    Stomach cancer Neg Hx     Allergies  Allergen Reactions   Pholcodine Nausea Only   Codeine Nausea And Vomiting    ALL FORMS   Oxycodone  Nausea And Vomiting    Medication list has been reviewed and updated.  Current Outpatient Medications on File Prior to Visit  Medication Sig Dispense Refill   acetaminophen  (TYLENOL ) 650 MG CR tablet Take 1,300 mg by mouth every 8 (eight) hours as needed for pain.     apixaban  (ELIQUIS ) 5 MG TABS tablet Take 1 tablet (5 mg total) by mouth 2 (two) times daily. 180 tablet 1   Cholecalciferol (VITAMIN D -3) 125 MCG (5000 UT) TABS Take 5,000 Units by mouth daily.      CINNAMON PO Take 500 mg by mouth daily.     diazepam  (VALIUM ) 5 MG tablet Take 30-40 minutes prior to MRI. 1 tablet 0   furosemide  (LASIX ) 20 MG tablet TAKE 1 TABLET BY MOUTH ONCE DAILY AND MAY TAKE AN EXTRA TABLET AS NEEDED FOR SHORTNESS OF BREATH OR WEIGHT GAIN 60 tablet 0   gabapentin  (NEURONTIN ) 800 MG tablet TAKE 1 TABLET BY MOUTH TWICE DAILY, IN THE MORNING AND AT BEDTIME 180 tablet 0   hydroxychloroquine  (PLAQUENIL ) 200 MG tablet Take 200 mg by mouth 2 (two) times daily.     losartan  (COZAAR ) 50 MG tablet TAKE 1 TABLET BY MOUTH EVERY MORNING 30 tablet 10   Magnesium 250 MG TABS Take 250 mg by mouth daily.     metFORMIN  (GLUCOPHAGE ) 500 MG tablet TAKE 1 TABLET BY MOUTH EVERY DAY AT BEDTIME 30 tablet 10   metoprolol  succinate (TOPROL -XL) 50 MG 24 hr tablet Take 1 tablet (50 mg total) by mouth daily. Take with or immediately following a meal. 90 tablet 3   omeprazole  (PRILOSEC) 40 MG capsule TAKE 1 CAPSULE BY MOUTH ONCE DAILY 30 capsule 10   ondansetron  (ZOFRAN ) 4 MG tablet Take 1 tablet (4 mg total) by mouth every 8 (eight) hours as needed. 30 tablet 0   rosuvastatin  (CRESTOR ) 20 MG tablet Take 1 tablet (20 mg total) by mouth daily. 90 tablet 0   Semaglutide , 1 MG/DOSE, 4 MG/3ML SOPN Inject 1 mg as directed once a week. 9 mL 2   sucralfate  (CARAFATE ) 1 g tablet Take 1 tablet (1 g total) by mouth 4 (four) times daily -  with meals and at bedtime. As needed 40 tablet 1   traMADol  (ULTRAM ) 50 MG tablet Take 1 tablet (50 mg total) by mouth every 6 (six) hours as needed for moderate pain. 30 tablet 0   Vitamin A 2400 MCG (8000 UT) CAPS Take 2,400 Units by mouth daily.     No current facility-administered medications on file prior to visit.    Review of Systems:  As per HPI- otherwise negative.   Physical Examination: There were no vitals filed for this visit. There were no vitals filed for this visit. There is no height or weight on file to calculate BMI. Ideal Body Weight:     GEN: no acute distress. HEENT: Atraumatic, Normocephalic.  Ears and Nose: No external deformity. CV: RRR, No M/G/R. No JVD. No thrill. No extra heart sounds. PULM: CTA B, no wheezes, crackles, rhonchi. No retractions. No resp. distress. No accessory muscle use. ABD: S, NT, ND, +BS. No rebound. No HSM.  EXTR: No c/c/e PSYCH: Normally interactive. Conversant.    Assessment and Plan: ***  Signed Gates Kasal, MD

## 2023-11-30 ENCOUNTER — Encounter (HOSPITAL_BASED_OUTPATIENT_CLINIC_OR_DEPARTMENT_OTHER): Payer: Self-pay

## 2023-11-30 ENCOUNTER — Ambulatory Visit (HOSPITAL_BASED_OUTPATIENT_CLINIC_OR_DEPARTMENT_OTHER)
Admission: RE | Admit: 2023-11-30 | Discharge: 2023-11-30 | Disposition: A | Discharge status: Home / Self Care | Source: Ambulatory Visit | Attending: Family Medicine | Admitting: Family Medicine

## 2023-11-30 DIAGNOSIS — R0789 Other chest pain: Secondary | ICD-10-CM | POA: Diagnosis not present

## 2023-11-30 DIAGNOSIS — R079 Chest pain, unspecified: Secondary | ICD-10-CM | POA: Diagnosis not present

## 2023-11-30 DIAGNOSIS — R1011 Right upper quadrant pain: Secondary | ICD-10-CM | POA: Diagnosis not present

## 2023-11-30 DIAGNOSIS — Z1231 Encounter for screening mammogram for malignant neoplasm of breast: Secondary | ICD-10-CM | POA: Insufficient documentation

## 2023-12-01 ENCOUNTER — Encounter: Payer: Self-pay | Admitting: Family Medicine

## 2023-12-01 ENCOUNTER — Other Ambulatory Visit: Payer: Self-pay | Admitting: Family Medicine

## 2023-12-01 DIAGNOSIS — E119 Type 2 diabetes mellitus without complications: Secondary | ICD-10-CM

## 2023-12-02 ENCOUNTER — Ambulatory Visit: Admitting: Family Medicine

## 2023-12-04 ENCOUNTER — Other Ambulatory Visit: Payer: Self-pay | Admitting: Family Medicine

## 2023-12-04 DIAGNOSIS — M329 Systemic lupus erythematosus, unspecified: Secondary | ICD-10-CM | POA: Diagnosis not present

## 2023-12-05 NOTE — Progress Notes (Deleted)
 [facility] [address], [address] [place], [name] [address] [phone]  Date:  12/09/2023   Name:  Amanda Saunders   DOB:  1956/03/15   MRN:  [id number]  PCP:  Kaylee Partridge, MD    Chief Complaint: No chief complaint on file.   History of Present Illness:  Amanda Saunders is a 68 y.o. very pleasant female patient who presents with the following:  Patient seen today for an EKG.  I saw her on April 23 with concern for chest pain.  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   Systemic lupus erythematosus (HCC) 08/04/2019   Acute ischemic stroke (HCC) 08/14/2018   HTN (hypertension) 08/14/2018   Controlled type 2 diabetes mellitus without complication, without long-term current use of insulin  (HCC) 04/03/2016   History of CVA (cerebrovascular accident) 03/12/2016   Chest pain at rest 03/12/2016   Peripheral edema 03/12/2016    Past Medical History:  Diagnosis Date   Anemia    Aortic atherosclerosis (HCC)    Arthritis    Atrial fibrillation (HCC)    Cardiomyopathy (HCC)    CHF (congestive heart failure) (HCC)    Diabetes mellitus without complication (HCC)    Dyspnea    Dysrhythmia    GERD (gastroesophageal reflux disease)    Headache    Heart murmur    at birth   Hypertension    Internal hemorrhoids    Lupus    PUD (peptic ulcer disease)    Stroke (HCC) 08/13/2018   Vertigo     Past Surgical History:  Procedure Laterality Date   BACK SURGERY      CARDIOVERSION N/A 07/03/2022   Procedure: CARDIOVERSION;  Surgeon: Wendie Hamburg, MD;  Location: [facility] ENDOSCOPY;  Service: Cardiovascular;  Laterality: N/A;   CARPAL TUNNEL RELEASE Bilateral    COLONOSCOPY     IRRIGATION AND DEBRIDEMENT KNEE Left 03/04/2023   Procedure: Left knee irrigation and debridement, wound closure;  Surgeon: Adonica Hoose, MD;  Location: [facility] ORS;  Service: Orthopedics;  Laterality: Left;   KNEE ARTHROPLASTY Left 12/17/2022   Procedure: COMPUTER ASSISTED TOTAL KNEE ARTHROPLASTY;  Surgeon: Adonica Hoose, MD;  Location: [facility] ORS;  Service: Orthopedics;  Laterality: Left;   KNEE CARTILAGE SURGERY Left    MENISECTOMY Left    NECK SURGERY     2015   UPPER GASTROINTESTINAL ENDOSCOPY      Social History   Tobacco Use   Smoking status: Former    Current packs/day: 0.00    Types: Cigarettes    Quit date: 07/21/2002    Years since quitting: 21.3   Smokeless tobacco: Never  Vaping Use   Vaping status: Never Used  Substance Use Topics   Alcohol  use: Not Currently    Comment: occasional   Drug use: No    Family History  Problem Relation Age of Onset   Hypertension Mother    Arthritis Mother    Heart failure Mother    Stroke Mother    Kidney  disease Father    Hypertension Father    Colon cancer Neg Hx    Esophageal cancer Neg Hx    Rectal cancer Neg Hx    Stomach cancer Neg Hx     Allergies  Allergen Reactions   Pholcodine Nausea Only   Codeine Nausea And Vomiting    ALL FORMS   Oxycodone  Nausea And Vomiting    Medication list has been reviewed and updated.  Current Outpatient Medications on File Prior to Visit  Medication Sig Dispense Refill   acetaminophen  (TYLENOL ) 650 MG CR tablet Take 1,300 mg by mouth every 8 (eight) hours as needed for pain.     apixaban  (ELIQUIS ) 5 MG TABS tablet Take 1 tablet (5 mg total) by mouth 2 (two) times daily. 180 tablet 1   Cholecalciferol (VITAMIN D -3) 125 MCG (5000 UT) TABS Take 5,000 Units by mouth daily.      CINNAMON PO Take 500 mg by mouth daily.     diazepam  (VALIUM ) 5 MG tablet Take 30-40 minutes prior to MRI. 1 tablet 0   furosemide  (LASIX ) 20 MG tablet TAKE 1 TABLET BY MOUTH ONCE DAILY AND MAY TAKE AN EXTRA TABLET AS NEEDED FOR SHORTNESS OF BREATH OR WEIGHT GAIN 60 tablet 0   gabapentin  (NEURONTIN ) 800 MG tablet TAKE 1 TABLET BY MOUTH TWICE DAILY, IN THE MORNING AND AT BEDTIME 180 tablet 0   hydroxychloroquine  (PLAQUENIL ) 200 MG tablet Take 200 mg by mouth 2 (two) times daily.     losartan  (COZAAR ) 50 MG tablet TAKE 1 TABLET BY MOUTH EVERY MORNING 30 tablet 10   Magnesium 250 MG TABS Take 250 mg by mouth daily.     metFORMIN  (GLUCOPHAGE ) 500 MG tablet TAKE 1 TABLET BY MOUTH EVERY DAY AT BEDTIME 30 tablet 10   metoprolol  succinate (TOPROL -XL) 50 MG 24 hr tablet TAKE 1 TABLET BY MOUTH ONCE DAILY WITH MEALS 30 tablet 0   omeprazole  (PRILOSEC) 40 MG capsule TAKE 1 CAPSULE BY MOUTH ONCE DAILY 30 capsule 10   ondansetron  (ZOFRAN ) 4 MG tablet Take 1 tablet (4 mg total) by mouth every 8 (eight) hours as needed. 30 tablet 0   rosuvastatin  (CRESTOR ) 20 MG tablet Take 1 tablet (20 mg total) by mouth daily. 90 tablet 0   Semaglutide , 1 MG/DOSE, (OZEMPIC , 1 MG/DOSE,) 4 MG/3ML SOPN Inject 1 mg into the skin once a week. 9 mL 0   sucralfate  (CARAFATE ) 1 g tablet Take 1 tablet (1 g total) by mouth 4 (four) times daily -  with meals and at bedtime. As needed 40 tablet 1   traMADol  (ULTRAM ) 50 MG tablet Take 1 tablet (50 mg total) by mouth every 6 (six) hours as needed for moderate pain. 30 tablet 0   Vitamin A 2400 MCG (8000 UT) CAPS Take 2,400 Units by mouth daily.     No current facility-administered medications on file prior to visit.    Review of Systems:  As per HPI- otherwise negative.   Physical Examination: There were no vitals filed for this visit. There were no vitals filed for this visit. There is no height or weight on file to calculate BMI. Ideal Body Weight:    GEN: no acute  distress. HEENT: Atraumatic, Normocephalic.  Ears and Nose: No external deformity. CV: RRR, No M/G/R. No JVD. No thrill. No extra heart sounds. PULM: CTA B, no wheezes, crackles, rhonchi. No retractions. No resp. distress. No accessory muscle use. ABD: S, NT, ND, +BS. No rebound. No HSM. EXTR: No c/c/e  PSYCH: Normally interactive. Conversant.    Assessment and Plan: ***  Signed Gates Kasal, MD

## 2023-12-07 ENCOUNTER — Ambulatory Visit: Attending: Nurse Practitioner

## 2023-12-07 ENCOUNTER — Encounter: Payer: Self-pay | Admitting: Nurse Practitioner

## 2023-12-07 ENCOUNTER — Ambulatory Visit: Attending: Nurse Practitioner | Admitting: Nurse Practitioner

## 2023-12-07 VITALS — BP 130/62 | HR 70 | Ht 69.0 in | Wt 169.0 lb

## 2023-12-07 DIAGNOSIS — E119 Type 2 diabetes mellitus without complications: Secondary | ICD-10-CM | POA: Diagnosis not present

## 2023-12-07 DIAGNOSIS — I5032 Chronic diastolic (congestive) heart failure: Secondary | ICD-10-CM

## 2023-12-07 DIAGNOSIS — D509 Iron deficiency anemia, unspecified: Secondary | ICD-10-CM | POA: Diagnosis not present

## 2023-12-07 DIAGNOSIS — R0602 Shortness of breath: Secondary | ICD-10-CM

## 2023-12-07 DIAGNOSIS — R072 Precordial pain: Secondary | ICD-10-CM | POA: Diagnosis not present

## 2023-12-07 DIAGNOSIS — E785 Hyperlipidemia, unspecified: Secondary | ICD-10-CM

## 2023-12-07 DIAGNOSIS — I480 Paroxysmal atrial fibrillation: Secondary | ICD-10-CM

## 2023-12-07 DIAGNOSIS — I428 Other cardiomyopathies: Secondary | ICD-10-CM | POA: Diagnosis not present

## 2023-12-07 DIAGNOSIS — I10 Essential (primary) hypertension: Secondary | ICD-10-CM

## 2023-12-07 DIAGNOSIS — Z8673 Personal history of transient ischemic attack (TIA), and cerebral infarction without residual deficits: Secondary | ICD-10-CM

## 2023-12-07 LAB — COMPREHENSIVE METABOLIC PANEL WITH GFR
ALT: 22 IU/L (ref 0–32)
AST: 26 IU/L (ref 0–40)
Albumin: 4 g/dL (ref 3.9–4.9)
Alkaline Phosphatase: 98 IU/L (ref 44–121)
BUN/Creatinine Ratio: 15 (ref 12–28)
BUN: 12 mg/dL (ref 8–27)
Bilirubin Total: <0.2 mg/dL (ref 0.0–1.2)
CO2: 24 mmol/L (ref 20–29)
Calcium: 8.9 mg/dL (ref 8.7–10.3)
Chloride: 102 mmol/L (ref 96–106)
Creatinine, Ser: 0.81 mg/dL (ref 0.57–1.00)
Globulin, Total: 2.5 g/dL (ref 1.5–4.5)
Glucose: 89 mg/dL (ref 70–99)
Potassium: 4.1 mmol/L (ref 3.5–5.2)
Sodium: 141 mmol/L (ref 134–144)
Total Protein: 6.5 g/dL (ref 6.0–8.5)
eGFR: 80 mL/min/{1.73_m2} (ref >59)

## 2023-12-07 LAB — LIPID PANEL
Chol/HDL Ratio: 2.4 ratio (ref 0.0–4.4)
Cholesterol, Total: 126 mg/dL (ref 100–199)
HDL: 53 mg/dL (ref >39)
LDL Chol Calc (NIH): 57 mg/dL (ref 0–99)
Triglycerides: 78 mg/dL (ref 0–149)
VLDL Cholesterol Cal: 16 mg/dL (ref 5–40)

## 2023-12-07 NOTE — Progress Notes (Unsigned)
 Enrolled for [employer] to mail a ZIO XT long term holter monitor to the patients address on file.   Dr. Jens Som to read.

## 2023-12-07 NOTE — Patient Instructions (Addendum)
 Medication Instructions:  Your physician recommends that you continue on your current medications as directed. Please refer to the Current Medication list given to you today.  *If you need a refill on your cardiac medications before your next appointment, please call your pharmacy*  Lab Work: Fasting Lipid panel & CMET today,   Testing/Procedures: Your physician has requested that you have an echocardiogram. Echocardiography is a painless test that uses sound waves to create images of your heart. It provides your doctor with information about the size and shape of your heart and how well your heart's chambers and valves are working. This procedure takes approximately one hour. There are no restrictions for this procedure. Please do NOT wear cologne, perfume, aftershave, or lotions (deodorant is allowed). Please arrive 15 minutes prior to your appointment time.  Please note: We ask at that you not bring children with you during ultrasound (echo/ vascular) testing. Due to room size and safety concerns, children are not allowed in the ultrasound rooms during exams. Our front office staff cannot provide observation of children in our lobby area while testing is being conducted. An adult accompanying a patient to their appointment will only be allowed in the ultrasound room at the discretion of the ultrasound technician under special circumstances. We apologize for any inconvenience.    ZIO  Term Monitor Instructions  Your physician has requested you wear a ZIO patch monitor for 14 days.  This is a single patch monitor. [employer] supplies one patch monitor per enrollment. Additional stickers are not available. Please do not apply patch if you will be having a Nuclear Stress Test,  Echocardiogram, Cardiac CT, MRI, or Chest Xray during the period you would be wearing the  monitor. The patch cannot be worn during these tests. You cannot remove and re-apply the  ZIO XT patch monitor.  Your ZIO patch  monitor will be mailed 3 day USPS to your address on file. It may take 3-5 days  to receive your monitor after you have been enrolled.  Once you have received your monitor, please review the enclosed instructions. Your monitor  has already been registered assigning a specific monitor serial # to you.  Billing and Patient Assistance Program Information  We have supplied [employer] with any of your insurance information on file for billing purposes. [employer] offers a sliding scale Patient Assistance Program for patients that do not have  insurance, or whose insurance does not completely cover the cost of the ZIO monitor.  You must apply for the Patient Assistance Program to qualify for this discounted rate.  To apply, please call [employer] at [phone], select option 4, select option 2, ask to apply for  Patient Assistance Program. Sanna Crystal will ask your household income, and how many people  are in your household. They will quote your out-of-pocket cost based on that information.  [employer] will also be able to set up a 64-month, interest-free payment plan if needed.  Applying the monitor   Shave hair from upper left chest.  Hold abrader disc by orange tab. Rub abrader in 40 strokes over the upper left chest as  indicated in your monitor instructions.  Clean area with 4 enclosed alcohol  pads. Let dry.  Apply patch as indicated in monitor instructions. Patch will be placed under collarbone on left  side of chest with arrow pointing upward.  Rub patch adhesive wings for 2 minutes. Remove white label marked "1". Remove the white  label marked "2". Rub patch adhesive wings for 2 additional  minutes.  While looking in a mirror, press and release button in center of patch. A small green light will  flash 3-4 times. This will be your only indicator that the monitor has been turned on.  Do not shower for the first 24 hours. You may shower after the first 24 hours.  Press the button if you feel a  symptom. You will hear a small click. Record Date, Time and  Symptom in the Patient Logbook.  When you are ready to remove the patch, follow instructions on the last 2 pages of Patient  Logbook. Stick patch monitor onto the last page of Patient Logbook.  Place Patient Logbook in the blue and white box. Use locking tab on box and tape box closed  securely. The blue and white box has prepaid postage on it. Please place it in the mailbox as  soon as possible. Your physician should have your test results approximately 7 days after the  monitor has been mailed back to [facility].  Call [facility] Customer Care at [phone] if you have questions regarding  your ZIO XT patch monitor. Call them immediately if you see an orange light blinking on your  monitor.  If your monitor falls off in less than 4 days, contact our Monitor department at [phone].  If your monitor becomes loose or falls off after 4 days call [employer] at [phone] for  suggestions on securing your monitor   Follow-Up: At [facility], you and your health needs are our priority.  As part of our continuing mission to provide you with exceptional heart care, our providers are all part of one team.  This team includes your primary Cardiologist (physician) and Advanced Practice Providers or APPs (Physician Assistants and Nurse Practitioners) who all work together to provide you with the care you need, when you need it.  Your next appointment:   6-8 week(s)  Provider:   Marlana Silvan, NP          We recommend signing up for the patient portal called "MyChart".  Sign up information is provided on this After Visit Summary.  MyChart is used to connect with patients for Virtual Visits (Telemedicine).  Patients are able to view lab/test results, encounter notes, upcoming appointments, etc.  Non-urgent messages can be sent to your provider as well.   To learn more about what you can do with MyChart, go to  [email or web address].

## 2023-12-07 NOTE — Addendum Note (Signed)
 Addended by: Marlana Silvan C on: 12/07/2023 01:19 PM   Modules accepted: Orders

## 2023-12-07 NOTE — Progress Notes (Signed)
 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   Past Medical History    Past Medical History:  Diagnosis Date   Anemia    Aortic atherosclerosis (HCC)    Arthritis    Atrial fibrillation (HCC)    Cardiomyopathy (HCC)    CHF (congestive heart failure) (HCC)    Diabetes mellitus without complication (HCC)    Dyspnea    Dysrhythmia    GERD (gastroesophageal reflux disease)    Headache    Heart murmur    at birth   Hypertension    Internal hemorrhoids    Lupus    PUD (peptic ulcer disease)    Stroke (HCC) 08/13/2018   Vertigo    Past Surgical History:  Procedure Laterality Date   BACK SURGERY     CARDIOVERSION N/A 07/03/2022   Procedure: CARDIOVERSION;  Surgeon: Wendie Hamburg, MD;  Location: [facility] ENDOSCOPY;  Service: Cardiovascular;  Laterality: N/A;   CARPAL TUNNEL RELEASE Bilateral    COLONOSCOPY     IRRIGATION AND DEBRIDEMENT KNEE Left 03/04/2023   Procedure: Left knee irrigation and debridement, wound closure;  Surgeon: Adonica Hoose, MD;  Location: [facility] ORS;  Service: Orthopedics;  Laterality: Left;   KNEE ARTHROPLASTY Left 12/17/2022   Procedure: COMPUTER ASSISTED TOTAL KNEE ARTHROPLASTY;  Surgeon: Adonica Hoose, MD;  Location: [facility] OR[facility];  Service: Orthopedics;  Laterality: Left;   KNEE CARTILAGE SURGERY Left    MENISECTOMY Left    NECK SURGERY     2015   UPPER GASTROINTESTINAL ENDOSCOPY      Allergies  Allergies  Allergen Reactions   Pholcodine Nausea Only   Codeine Nausea And Vomiting    ALL FORMS   Oxycodone   Nausea And Vomiting     Labs/Other Studies Reviewed    The following studies were reviewed today:  Cardiac Studies & Procedures   ______________________________________________________________________________________________   STRESS TESTS  MYOCARDIAL PERFUSION IMAGING 06/19/2016  Narrative  There was no ST segment deviation noted during stress.  The study is normal.  This is a low risk study.  The left ventricular ejection fraction is mildly decreased (45-54%).  Normal pharmacologic nuclear stress test with no evidence of prior infarct or ischemia. Mildly impaired LVEF consistent with non-ischemic cardiomyopathy.   ECHOCARDIOGRAM  ECHOCARDIOGRAM COMPLETE 07/08/2022  Narrative ECHOCARDIOGRAM REPORT    Patient Name:   Amanda Saunders Date of Exam: 07/08/2022 Medical Rec #:  [id number]           Height:       63.0 in Accession #:    [id number]          Weight:       198.0 lb Date of Birth:  1955/08/07            BSA:          1.925 m Patient Age:    66 years            BP:           110/82 mmHg Patient Gender: F  HR:           71 bpm. Exam Location:  [address]  Procedure: 2D Echo, 3D Echo, Cardiac Doppler and Color Doppler  Indications:    I48.91 Atrial Fibrillation  History:        Patient has prior history of Echocardiogram examinations, most recent 04/02/2021. CHF, Abnormal ECG, Stroke, Arrythmias:Atrial Fibrillation and Atrial Flutter, Signs/Symptoms:Fatigue; Risk Factors:Hypertension, Family History of Coronary Artery Disease and Former Smoker. Status Post Cardioversion (07-03-22), Non-Compaction Cardiomyopathy, Lupus, Headaches.  Sonographer:    Ewing Holiday RDCS Referring Phys: Deannie Fabian CRENSHAW  IMPRESSIONS   1. Left ventricular ejection fraction, by estimation, is 60 to 65%. The left ventricle has normal function. The left ventricle has no regional wall motion abnormalities. Left ventricular diastolic parameters are  consistent with Grade II diastolic dysfunction (pseudonormalization). 2. Right ventricular systolic function is normal. The right ventricular size is normal. There is normal pulmonary artery systolic pressure. 3. Left atrial size was moderately dilated. 4. The mitral valve is normal in structure. Trivial mitral valve regurgitation. No evidence of mitral stenosis. 5. The aortic valve is tricuspid. Aortic valve regurgitation is not visualized. No aortic stenosis is present. 6. The inferior vena cava is normal in size with greater than 50% respiratory variability, suggesting right atrial pressure of 3 mmHg.  Comparison(s): Prior EF 50-55%.  FINDINGS Left Ventricle: Left ventricular ejection fraction, by estimation, is 60 to 65%. The left ventricle has normal function. The left ventricle has no regional wall motion abnormalities. The left ventricular internal cavity size was normal in size. There is no left ventricular hypertrophy. Left ventricular diastolic parameters are consistent with Grade II diastolic dysfunction (pseudonormalization). Indeterminate filling pressures.  Right Ventricle: The right ventricular size is normal. No increase in right ventricular wall thickness. Right ventricular systolic function is normal. There is normal pulmonary artery systolic pressure. The tricuspid regurgitant velocity is 2.52 m/s, and with an assumed right atrial pressure of 3 mmHg, the estimated right ventricular systolic pressure is 28.4 mmHg.  Left Atrium: Left atrial size was moderately dilated.  Right Atrium: Right atrial size was normal in size.  Pericardium: There is no evidence of pericardial effusion.  Mitral Valve: The mitral valve is normal in structure. Trivial mitral valve regurgitation. No evidence of mitral valve stenosis.  Tricuspid Valve: The tricuspid valve is normal in structure. Tricuspid valve regurgitation is mild . No evidence of tricuspid stenosis.  Aortic Valve: The aortic valve  is tricuspid. Aortic valve regurgitation is not visualized. No aortic stenosis is present.  Pulmonic Valve: The pulmonic valve was normal in structure. Pulmonic valve regurgitation is trivial. No evidence of pulmonic stenosis.  Aorta: The aortic root is normal in size and structure.  Venous: The inferior vena cava is normal in size with greater than 50% respiratory variability, suggesting right atrial pressure of 3 mmHg.  IAS/Shunts: No atrial level shunt detected by color flow Doppler.   LEFT VENTRICLE PLAX 2D LVIDd:         4.70 cm   Diastology LVIDs:         3.00 cm   LV e' medial:    5.22 cm/s LV PW:         0.80 cm   LV E/e' medial:  12.1 LV IVS:        0.90 cm   LV e' lateral:   5.98 cm/s LVOT diam:     1.95 cm   LV E/e' lateral: 10.6 LV SV:  51 LV SV Index:   27 LVOT Area:     2.99 cm  3D Volume EF: 3D EF:        70 % LV EDV:       139 ml LV ESV:       41 ml LV SV:        98 ml  RIGHT VENTRICLE RV Basal diam:  3.40 cm RV S prime:     9.46 cm/s TAPSE (M-mode): 1.8 cm  LEFT ATRIUM             Index        RIGHT ATRIUM           Index LA diam:        4.30 cm 2.23 cm/m   RA Area:     12.10 cm LA Vol (A2C):   74.4 ml 38.64 ml/m  RA Volume:   28.70 ml  14.91 ml/m LA Vol (A4C):   67.7 ml 35.16 ml/m LA Biplane Vol: 72.9 ml 37.86 ml/m AORTIC VALVE LVOT Vmax:   82.70 cm/s LVOT Vmean:  53.350 cm/s LVOT VTI:    0.171 m  AORTA Ao Root diam: 2.70 cm Ao Asc diam:  3.40 cm  MITRAL VALVE               TRICUSPID VALVE MV Area (PHT)  cm         TR Peak grad:   25.4 mmHg MV Decel Time: 233 msec    TR Vmax:        252.00 cm/s MV E velocity: 63.20 cm/s MV A velocity: 63.20 cm/s  SHUNTS MV E/A ratio:  1.00        Systemic VTI:  0.17 m Systemic Diam: 1.95 cm  Maudine Sos MD Electronically signed by Maudine Sos MD Signature Date/Time: 07/08/2022/5:21:06 PM    Final        CARDIAC MRI  MR CARDIAC MORPHOLOGY W WO CONTRAST  03/02/2019  Narrative CLINICAL DATA:  Cardiomyopathy?  Ventricular Non-Compaction  EXAM: CARDIAC MRI  TECHNIQUE: The patient was scanned on a 1.5 Tesla Siemens magnet. A dedicated cardiac coil was used. Functional imaging was done using Fiesta sequences. 2,3, and 4 chamber views were done to assess for RWMA's. Modified Simpson's rule using a short axis stack was used to calculate an ejection fraction on a dedicated work [employer]. The patient received 10 cc of Gadavist . After 10 minutes inversion recovery sequences were used to assess for infiltration and scar tissue.  CONTRAST:  Gadavist   FINDINGS: Mild LAE. Normal RA/RV. Normal AV, MV, TV. Mild appearing AR and MR. No ASD/PFO. Normal aortic root. Normal RV size and function. Mild LVE with mild diffuse hypokinesis. Quantitative EF was 44%  (EDV 148 cc ESV 82 cc SV 65 cc) There was evidence of ventricular non compaction with dense trabeculation in mid and apical LV cavity with depth 3:1 compared to diastolic myocardium in same area. No significant hyper enhancement on delayed inversion recovery sequences  IMPRESSION: 1.  Mild LVE with diffuse hypokinesis EF 44%  2.   Evidence for ventricular non compaction  3.  Mild LAE  4.  Mild MR  5.  Mild AR  6.  No delayed hyper-enhancement post gadolinium  Janelle Mediate   Electronically Signed By: Janelle Mediate M.D. On: 03/02/2019 16:50   ______________________________________________________________________________________________     Recent Labs: 06/22/2023: TSH 3.08 11/05/2023: ALT 20; BUN 18; Creatinine, Ser 0.87; Hemoglobin 10.4; Platelets 238.0; Potassium 4.2; Sodium  140  Recent Lipid Panel    Component Value Date/Time   CHOL 111 07/28/2022 1122   TRIG 72 07/28/2022 1122   HDL 41 07/28/2022 1122   CHOLHDL 2.7 07/28/2022 1122   CHOLHDL 3 06/08/2020 0925   VLDL 15.2 06/08/2020 0925   LDLCALC 55 07/28/2022 1122    History of Present Illness     67 year old female with the above past medical history including paroxysmal atrial fibrillation, chronic diastolic heart failure, 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   Nuclear study November 2017 showed no infarct or ischemia, mildly reduced LV function.  Echocardiogram in July 2019 showed EF 45 to 50%, prominent apical trabeculation concerning for noncompaction and mild tricuspid regurgitation.  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  Carotid Dopplers in January 2020 showed 1 to 39% bilateral carotid artery stenosis.  Cardiac MRI in 02/2019 showed EF 44%, finding consistent with noncompaction, mild LAE, mild AI, and MR.  Echocardiogram in September 2022 showed EF 50 to 55%, apical trabeculations suggestive of elevated noncompaction.  She was evaluated in the ED in 05/2022 in the setting of dyspnea. CTA chest showed no pulmonary embolus, small pleural effusions.  She was treated for CHF and URI. She was diagnosed with atrial fibrillation in 05/2022.  She underwent DCCV on 07/03/2022 with restoration of NSR.  Echocardiogram in 06/2022 showed EF 60 to 65%, normal LV function, no RWMA, G2 DD, normal RV systolic function.  She was last seen in the office on 07/28/2022 and was stable from a cardiac standpoint.  Her PCP contacted our office on 11/13/2023 with concern for shortness of breath, atypical chest pain (per PCP notes troponin was negative, EKG unchanged, she was maintaining sinus rhythm). Consideration for ischemic evaluation was recommended.   She presents today for follow-up accompanied by her husband.  Since her last visit  he has been stable from a cardiac standpoint.  Approximately 1-2 times a week she will have episodes of feeling "overwhelmed" with associated diaphoresis and shakiness, she feels her heart is "pounding."  She will have associated shortness of breath and chest tightness.  Symptoms last  for 10 minutes or less.  She also notes shortness of breath both at rest and with activity.  She has intermittent dizziness (reported history of vertigo).  She denies any presyncope, syncope.  She will note intermittent fleeting chest discomfort which she describes as a "shock," this lasts for only a second and resolves spontaneously. She denies exertional chest pain, edema, PND, orthopnea, weight gain.  She does note a significant amount of personal stress, but has not specifically associated her symptoms with stress.  Home Medications    Current Outpatient Medications  Medication Sig Dispense Refill   acetaminophen  (TYLENOL ) 650 MG CR tablet Take 1,300 mg by mouth every 8 (eight) hours as needed for pain.     apixaban  (ELIQUIS ) 5 MG TABS tablet Take 1 tablet (5 mg total) by mouth 2 (two) times daily. 180 tablet 1   Cholecalciferol (VITAMIN D -3) 125 MCG (5000 UT) TABS Take 5,000 Units by mouth daily.     CINNAMON PO Take 500 mg by mouth daily.     diazepam  (VALIUM ) 5 MG tablet Take 30-40 minutes prior to MRI. 1 tablet 0   furosemide  (LASIX ) 20 MG tablet TAKE 1 TABLET BY MOUTH ONCE DAILY AND MAY TAKE AN EXTRA TABLET AS NEEDED FOR SHORTNESS OF BREATH OR WEIGHT GAIN 60 tablet 0  gabapentin  (NEURONTIN ) 800 MG tablet TAKE 1 TABLET BY MOUTH TWICE DAILY, IN THE MORNING AND AT BEDTIME 180 tablet 0   hydroxychloroquine  (PLAQUENIL ) 200 MG tablet Take 200 mg by mouth 2 (two) times daily.     Magnesium 250 MG TABS Take 250 mg by mouth daily.     metFORMIN  (GLUCOPHAGE ) 500 MG tablet TAKE 1 TABLET BY MOUTH EVERY DAY AT BEDTIME 30 tablet 10   metoprolol  succinate (TOPROL -XL) 50 MG 24 hr tablet TAKE 1 TABLET BY MOUTH ONCE DAILY WITH MEALS 30 tablet 0   omeprazole  (PRILOSEC) 40 MG capsule TAKE 1 CAPSULE BY MOUTH ONCE DAILY 30 capsule 10   ondansetron  (ZOFRAN ) 4 MG tablet Take 1 tablet (4 mg total) by mouth every 8 (eight) hours as needed. 30 tablet 0   rosuvastatin  (CRESTOR ) 20 MG tablet Take 1 tablet (20 mg  total) by mouth daily. 90 tablet 0   Semaglutide , 1 MG/DOSE, (OZEMPIC , 1 MG/DOSE,) 4 MG/3ML SOPN Inject 1 mg into the skin once a week. 9 mL 0   sucralfate  (CARAFATE ) 1 g tablet Take 1 tablet (1 g total) by mouth 4 (four) times daily -  with meals and at bedtime. As needed 40 tablet 1   traMADol  (ULTRAM ) 50 MG tablet Take 1 tablet (50 mg total) by mouth every 6 (six) hours as needed for moderate pain. 30 tablet 0   Vitamin A 2400 MCG (8000 UT) CAPS Take 2,400 Units by mouth daily.     losartan  (COZAAR ) 50 MG tablet TAKE 1 TABLET BY MOUTH EVERY MORNING 30 tablet 10   No current facility-administered medications for this visit.     Review of Systems   She denies pnd, orthopnea, n, v, syncope, edema, weight gain, or early satiety. All other systems reviewed and are otherwise negative except as noted above.   Physical Exam    VS:  BP 130/62   Pulse 70   Ht 5\' 9"  (1.753 m)   Wt 169 lb (76.7 kg)   SpO2 97%   BMI 24.96 kg/m   GEN: Well nourished, well developed, in no acute distress. HEENT: normal. Neck: Supple, no JVD, carotid bruits, or masses. Cardiac: RRR, no murmurs, rubs, or gallops. No clubbing, cyanosis, edema.  Radials/DP/PT 2+ and equal bilaterally.  Respiratory:  Respirations regular and unlabored, clear to auscultation bilaterally. GI: Soft, nontender, nondistended, BS + x 4. MS: no deformity or atrophy. Skin: warm and dry, no rash. Neuro:  Strength and sensation are intact. Psych: Normal affect.  Accessory Clinical Findings    ECG personally reviewed by me today - EKG Interpretation Date/Time:  Monday Dec 07 2023 08:57:43 EDT Ventricular Rate:  70 PR Interval:  186 QRS Duration:  74 QT Interval:  424 QTC Calculation: 457 R Axis:   24  Text Interpretation: Normal sinus rhythm Nonspecific T wave abnormality When compared with ECG of 03-Jul-2022 08:48, QT has shortened Confirmed by Marlana Silvan ([id number]) on 12/07/2023 9:06:07 AM  - no acute changes.   Lab Results   Component Value Date   WBC 5.3 11/05/2023   HGB 10.4 (L) 11/05/2023   HCT 32.0 (L) 11/05/2023   MCV 77.1 (L) 11/05/2023   PLT 238.0 11/05/2023   Lab Results  Component Value Date   CREATININE 0.87 11/05/2023   BUN 18 11/05/2023   NA 140 11/05/2023   K 4.2 11/05/2023   CL 102 11/05/2023   CO2 32 11/05/2023   Lab Results  Component Value Date   ALT 20 11/05/2023  AST 26 11/05/2023   ALKPHOS 87 11/05/2023   BILITOT 0.2 11/05/2023   Lab Results  Component Value Date   CHOL 111 07/28/2022   HDL 41 07/28/2022   LDLCALC 55 07/28/2022   TRIG 72 07/28/2022   CHOLHDL 2.7 07/28/2022    Lab Results  Component Value Date   HGBA1C 6.2 11/05/2023    Assessment & Plan   1. Chest pain/shortness of breath/palpitations: Approximately 1-2 times a week she will have episodes of feeling "overwhelmed" with associated diaphoresis and shakiness, she feels her heart is "pounding."  She will have associated shortness of breath and chest tightness.  Symptoms last for 10 minutes or less.  She also notes shortness of breath both at rest and with activity.  She has stable intermittent dizziness (reported history of vertigo).  She denies any presyncope, syncope.  She will note intermittent fleeting chest discomfort which she describes as a "shock," this lasts for only a second and resolves spontaneously. She denies exertional chest pain.  Per PCP, troponin and d.dimer have been negative, EKG without acute changes. Euvolemic and well compensated on exam.  Will check 14-day ZIO monitor to assess for recurrent atrial fibrillation, rule out other significant arrhythmia.  Will update echo.  If workup unrevealing, consider need for ischemic evaluation.  We discussed possible coronary CTA, however she does have a history of significant claustrophobia, she would likely require premedication.  Otherwise, could consider stress test.  Reviewed ED precautions.  2. Paroxysmal atrial fibrillation: Diagnosed in 05/2022.   S/p DCCV on 07/03/2022. Maintaining NSR.  Recent palpitations as above.  14-day ZIO, echo pending as above.  Continue Eliquis , metoprolol .   3. Chronic diastolic heart failure: Most recent echo in 06/2022 showed EF 60 to 65%, normal LV function, no RWMA, G2 DD, normal RV systolic function.  Recent episodic shortness of breath as well as mild shortness of breath with exertion as above.  Euvolemic and well compensated on exam. Continue metoprolol , Lasix .   4. Cardiomyopathy: Prior evidence of cardiomyopathy consistent with noncompaction.  Recent echo stable as above, repeat echo pending in the setting of shortness of breath. Continue current medications as above, including Eliquis  ([facility] prior notes, history of CVA possibly secondary to intracranial vascular disease versus noncompaction).  No family history of sudden death. Stable chronic intermittent dizziness likely in the setting of vertigo. Denies presyncope, syncope.   5. Hypertension: BP well controlled. Continue current antihypertensive regimen.    6. Hyperlipidemia: LDL was 55 in 07/2022. Will repeat fasting lipids, CMET. Continue Crestor .    7. History of CVA: Continue Eliquis , Crestor .   8.  Type 2 diabetes: A1c was 6.2 in 10/2023.  Monitor managed per PCP.  9.  Anemia: Hemoglobin was 10.4 in 10/2023.  Monitored and managed per PCP.   8. Disposition: Follow-up in 6 weeks.       Jude Norton, NP 12/07/2023, 9:41 AM

## 2023-12-09 ENCOUNTER — Ambulatory Visit: Admitting: Family Medicine

## 2023-12-09 ENCOUNTER — Ambulatory Visit: Payer: Self-pay | Admitting: Nurse Practitioner

## 2023-12-20 ENCOUNTER — Other Ambulatory Visit: Payer: Self-pay | Admitting: Family Medicine

## 2023-12-22 ENCOUNTER — Telehealth: Payer: Self-pay | Admitting: Neurology

## 2023-12-22 ENCOUNTER — Other Ambulatory Visit: Payer: Self-pay

## 2023-12-22 ENCOUNTER — Telehealth: Payer: Self-pay | Admitting: Cardiology

## 2023-12-22 ENCOUNTER — Other Ambulatory Visit: Payer: Self-pay | Admitting: Neurology

## 2023-12-22 DIAGNOSIS — Z8673 Personal history of transient ischemic attack (TIA), and cerebral infarction without residual deficits: Secondary | ICD-10-CM

## 2023-12-22 MED ORDER — GABAPENTIN 800 MG PO TABS: ORAL_TABLET | ORAL | 1 refills | Status: DC

## 2023-12-22 MED ORDER — APIXABAN 5 MG PO TABS: 5.0000 mg | ORAL_TABLET | Freq: Two times a day (BID) | ORAL | 1 refills | Status: DC

## 2023-12-22 NOTE — Telephone Encounter (Signed)
 Pt called stating she needs a refill on her Gabapentin  500mg   [facility] on [address]

## 2023-12-22 NOTE — Telephone Encounter (Signed)
 Prescription refill request for Eliquis  received. Indication:afib Last office visit:5/25 Scr:0.81  5/25 Age: 68 Weight:76.7  kg  Prescription refilled

## 2023-12-22 NOTE — Telephone Encounter (Signed)
*  STAT* If patient is at the pharmacy, call can be transferred to refill team.   1. Which medications need to be refilled? (please list name of each medication and dose if known) apixaban  (ELIQUIS ) 5 MG TABS tablet    2. Would you like to learn more about the convenience, safety, & potential cost savings by using the [facility]?     3. Are you open to using the [facility] (Type [facility].    4. Which pharmacy/location (including street and city if local pharmacy) is medication to be sent to?  [facility] [address], [name] - [address]     5. Do they need a 30 day or 90 day supply? 90 day

## 2024-01-01 DIAGNOSIS — M329 Systemic lupus erythematosus, unspecified: Secondary | ICD-10-CM | POA: Diagnosis not present

## 2024-01-05 ENCOUNTER — Other Ambulatory Visit: Payer: Self-pay | Admitting: Family Medicine

## 2024-01-05 DIAGNOSIS — I480 Paroxysmal atrial fibrillation: Secondary | ICD-10-CM

## 2024-01-05 DIAGNOSIS — R0602 Shortness of breath: Secondary | ICD-10-CM | POA: Diagnosis not present

## 2024-01-06 ENCOUNTER — Encounter: Payer: Self-pay | Admitting: Family Medicine

## 2024-01-13 ENCOUNTER — Ambulatory Visit (HOSPITAL_COMMUNITY)

## 2024-01-19 ENCOUNTER — Ambulatory Visit: Admitting: Nurse Practitioner

## 2024-01-19 NOTE — Progress Notes (Deleted)
 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   Past Medical History    Past Medical History:  Diagnosis Date   Anemia    Aortic atherosclerosis (HCC)    Arthritis    Atrial fibrillation (HCC)    Cardiomyopathy (HCC)    CHF (congestive heart failure) (HCC)    Diabetes mellitus without complication (HCC)    Dyspnea    Dysrhythmia    GERD (gastroesophageal reflux disease)    Headache    Heart murmur    at birth   Hypertension    Internal hemorrhoids    Lupus    PUD (peptic ulcer disease)    Stroke (HCC) 08/13/2018   Vertigo    Past Surgical History:  Procedure Laterality Date   BACK SURGERY     CARDIOVERSION N/A 07/03/2022   Procedure: CARDIOVERSION;  Surgeon: Kate Lonni CROME, MD;  Location: [facility] ENDOSCOPY;  Service: Cardiovascular;  Laterality: N/A;   CARPAL TUNNEL RELEASE Bilateral    COLONOSCOPY     IRRIGATION AND DEBRIDEMENT KNEE Left 03/04/2023   Procedure: Left knee irrigation and debridement, wound closure;  Surgeon: Fidel Redell, MD;  Location: [facility] ORS;  Service: Orthopedics;  Laterality: Left;   KNEE ARTHROPLASTY Left 12/17/2022   Procedure: COMPUTER ASSISTED TOTAL KNEE ARTHROPLASTY;  Surgeon: Fidel Redell, MD;  Location: [facility] ORS;  Service: Orthopedics;  Laterality: Left;   KNEE CARTILAGE SURGERY Left    MENISECTOMY Left    NECK SURGERY     2015   UPPER GASTROINTESTINAL ENDOSCOPY      Allergies  Allergies  Allergen Reactions   Pholcodine Nausea Only   Codeine Nausea And Vomiting    ALL FORMS   Oxycodone   Nausea And Vomiting     Labs/Other Studies Reviewed    The following studies were reviewed today:  Cardiac Studies & Procedures   ______________________________________________________________________________________________   STRESS TESTS  MYOCARDIAL PERFUSION IMAGING 06/19/2016  Narrative  There was no ST segment deviation noted during stress.  The study is normal.  This is a low risk study.  The left ventricular ejection fraction is mildly decreased (45-54%).  Normal pharmacologic nuclear stress test with no evidence of prior infarct or ischemia. Mildly impaired LVEF consistent with non-ischemic cardiomyopathy.   ECHOCARDIOGRAM  ECHOCARDIOGRAM COMPLETE 07/08/2022  Narrative ECHOCARDIOGRAM REPORT    Patient Name:   TEDDI BADALAMENTI Date of Exam: 07/08/2022 Medical Rec #:  [id number]           Height:       63.0 in Accession #:    [id number]          Weight:       198.0 lb Date of Birth:  [birth year]            BSA:          1.925 m Patient Age:    66 years            BP:           110/82 mmHg Patient Gender: F  HR:           71 bpm. Exam Location:  [address]  Procedure: 2D Echo, 3D Echo, Cardiac Doppler and Color Doppler  Indications:    I48.91 Atrial Fibrillation  History:        Patient has prior history of Echocardiogram examinations, most recent 04/02/2021. CHF, Abnormal ECG, Stroke, Arrythmias:Atrial Fibrillation and Atrial Flutter, Signs/Symptoms:Fatigue; Risk Factors:Hypertension, Family History of Coronary Artery Disease and Former Smoker. Status Post Cardioversion (07-03-22), Non-Compaction Cardiomyopathy, Lupus, Headaches.  Sonographer:    Heather Hawks RDCS Referring Phys: REDELL RAMAN CRENSHAW  IMPRESSIONS   1. Left ventricular ejection fraction, by estimation, is 60 to 65%. The left ventricle has normal function. The left ventricle has no regional wall motion abnormalities. Left ventricular diastolic parameters are  consistent with Grade II diastolic dysfunction (pseudonormalization). 2. Right ventricular systolic function is normal. The right ventricular size is normal. There is normal pulmonary artery systolic pressure. 3. Left atrial size was moderately dilated. 4. The mitral valve is normal in structure. Trivial mitral valve regurgitation. No evidence of mitral stenosis. 5. The aortic valve is tricuspid. Aortic valve regurgitation is not visualized. No aortic stenosis is present. 6. The inferior vena cava is normal in size with greater than 50% respiratory variability, suggesting right atrial pressure of 3 mmHg.  Comparison(s): Prior EF 50-55%.  FINDINGS Left Ventricle: Left ventricular ejection fraction, by estimation, is 60 to 65%. The left ventricle has normal function. The left ventricle has no regional wall motion abnormalities. The left ventricular internal cavity size was normal in size. There is no left ventricular hypertrophy. Left ventricular diastolic parameters are consistent with Grade II diastolic dysfunction (pseudonormalization). Indeterminate filling pressures.  Right Ventricle: The right ventricular size is normal. No increase in right ventricular wall thickness. Right ventricular systolic function is normal. There is normal pulmonary artery systolic pressure. The tricuspid regurgitant velocity is 2.52 m/s, and with an assumed right atrial pressure of 3 mmHg, the estimated right ventricular systolic pressure is 28.4 mmHg.  Left Atrium: Left atrial size was moderately dilated.  Right Atrium: Right atrial size was normal in size.  Pericardium: There is no evidence of pericardial effusion.  Mitral Valve: The mitral valve is normal in structure. Trivial mitral valve regurgitation. No evidence of mitral valve stenosis.  Tricuspid Valve: The tricuspid valve is normal in structure. Tricuspid valve regurgitation is mild . No evidence of tricuspid stenosis.  Aortic Valve: The aortic valve  is tricuspid. Aortic valve regurgitation is not visualized. No aortic stenosis is present.  Pulmonic Valve: The pulmonic valve was normal in structure. Pulmonic valve regurgitation is trivial. No evidence of pulmonic stenosis.  Aorta: The aortic root is normal in size and structure.  Venous: The inferior vena cava is normal in size with greater than 50% respiratory variability, suggesting right atrial pressure of 3 mmHg.  IAS/Shunts: No atrial level shunt detected by color flow Doppler.   LEFT VENTRICLE PLAX 2D LVIDd:         4.70 cm   Diastology LVIDs:         3.00 cm   LV e' medial:    5.22 cm/s LV PW:         0.80 cm   LV E/e' medial:  12.1 LV IVS:        0.90 cm   LV e' lateral:   5.98 cm/s LVOT diam:     1.95 cm   LV E/e' lateral: 10.6 LV SV:  51 LV SV Index:   27 LVOT Area:     2.99 cm  3D Volume EF: 3D EF:        70 % LV EDV:       139 ml LV ESV:       41 ml LV SV:        98 ml  RIGHT VENTRICLE RV Basal diam:  3.40 cm RV S prime:     9.46 cm/s TAPSE (M-mode): 1.8 cm  LEFT ATRIUM             Index        RIGHT ATRIUM           Index LA diam:        4.30 cm 2.23 cm/m   RA Area:     12.10 cm LA Vol (A2C):   74.4 ml 38.64 ml/m  RA Volume:   28.70 ml  14.91 ml/m LA Vol (A4C):   67.7 ml 35.16 ml/m LA Biplane Vol: 72.9 ml 37.86 ml/m AORTIC VALVE LVOT Vmax:   82.70 cm/s LVOT Vmean:  53.350 cm/s LVOT VTI:    0.171 m  AORTA Ao Root diam: 2.70 cm Ao Asc diam:  3.40 cm  MITRAL VALVE               TRICUSPID VALVE MV Area (PHT)  cm         TR Peak grad:   25.4 mmHg MV Decel Time: 233 msec    TR Vmax:        252.00 cm/s MV E velocity: 63.20 cm/s MV A velocity: 63.20 cm/s  SHUNTS MV E/A ratio:  1.00        Systemic VTI:  0.17 m Systemic Diam: 1.95 cm  Annabella Scarce MD Electronically signed by Annabella Scarce MD Signature Date/Time: 07/08/2022/5:21:06 PM    Final    MONITORS  LONG TERM MONITOR (3-14 DAYS) 01/05/2024  Narrative Patch Wear  Time:  12 days and 17 hours (2025-05-21T23:32:51-0400 to 2025-06-03T16:41:27-0400)  Patient had a min HR of 61 bpm, max HR of 211 bpm, and avg HR of 80 bpm. Predominant underlying rhythm was Sinus Rhythm. 2 Ventricular Tachycardia runs occurred, the run with the fastest interval lasting 9 beats with a max rate of 211 bpm, the longest lasting 9 beats with an avg rate of 146 bpm. 3 Supraventricular Tachycardia runs occurred, the run with the fastest interval lasting 7 beats with a max rate of 150 bpm, the longest lasting 7 beats with an avg rate of 129 bpm. Isolated SVEs were rare (<1.0%), SVE Couplets were rare (<1.0%), and SVE Triplets were rare (<1.0%). Isolated VEs were rare (<1.0%), VE Couplets were rare (<1.0%), and no VE Triplets were present.  Normal sinus rhythm, sinus tachycardia, occasional PAC, short runs of SVT, occasional PVC and couplet and 9 beats nonsustained ventricular tachycardia. Redell Shallow     CARDIAC MRI  MR CARDIAC MORPHOLOGY W WO CONTRAST 03/02/2019  Narrative CLINICAL DATA:  Cardiomyopathy?  Ventricular Non-Compaction  EXAM: CARDIAC MRI  TECHNIQUE: The patient was scanned on a 1.5 Tesla Siemens magnet. A dedicated cardiac coil was used. Functional imaging was done using Fiesta sequences. 2,3, and 4 chamber views were done to assess for RWMA's. Modified Simpson's rule using a short axis stack was used to calculate an ejection fraction on a dedicated work [employer]. The patient received 10 cc of Gadavist . After 10 minutes inversion recovery sequences were used to assess for infiltration and scar  tissue.  CONTRAST:  Gadavist   FINDINGS: Mild LAE. Normal RA/RV. Normal AV, MV, TV. Mild appearing AR and MR. No ASD/PFO. Normal aortic root. Normal RV size and function. Mild LVE with mild diffuse hypokinesis. Quantitative EF was 44%  (EDV 148 cc ESV 82 cc SV 65 cc) There was evidence of ventricular non compaction with dense trabeculation in  mid and apical LV cavity with depth 3:1 compared to diastolic myocardium in same area. No significant hyper enhancement on delayed inversion recovery sequences  IMPRESSION: 1.  Mild LVE with diffuse hypokinesis EF 44%  2.   Evidence for ventricular non compaction  3.  Mild LAE  4.  Mild MR  5.  Mild AR  6.  No delayed hyper-enhancement post gadolinium  Maude Emmer   Electronically Signed By: Maude Emmer M.D. On: 03/02/2019 16:50   ______________________________________________________________________________________________     Recent Labs: 06/22/2023: TSH 3.08 11/05/2023: Hemoglobin 10.4; Platelets 238.0 12/07/2023: ALT 22; BUN 12; Creatinine, Ser 0.81; Potassium 4.1; Sodium 141  Recent Lipid Panel    Component Value Date/Time   CHOL 126 12/07/2023 0958   TRIG 78 12/07/2023 0958   HDL 53 12/07/2023 0958   CHOLHDL 2.4 12/07/2023 0958   CHOLHDL 3 06/08/2020 0925   VLDL 15.2 06/08/2020 0925   LDLCALC 57 12/07/2023 0958    History of Present Illness    68 year old female with the above past medical history including paroxysmal atrial fibrillation, chronic diastolic heart failure, 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   Nuclear study November 2017 showed no infarct or ischemia, mildly reduced LV function.  Echocardiogram in July 2019 showed EF 45 to 50%, prominent apical trabeculation concerning for noncompaction and mild tricuspid regurgitation.  She was hospitalized in January 2020 in the setting of CVA.  Carotid Dopplers in January 2020 showed 1 to 39% bilateral carotid artery stenosis.  Cardiac MRI in 02/2019 showed EF 44%, finding consistent with noncompaction, mild LAE, mild AI, and MR.  Echocardiogram in September 2022 showed EF 50 to 55%, apical trabeculations suggestive of elevated noncompaction.  She was evaluated in the ED in 05/2022 in the setting of dyspnea. CTA chest showed no  pulmonary embolus, small pleural effusions.  She was treated for CHF and URI. She was diagnosed with atrial fibrillation in 05/2022.  She underwent DCCV on 07/03/2022 with restoration of NSR.  Echocardiogram in 06/2022 showed EF 60 to 65%, normal LV function, no RWMA, G2 DD, normal RV systolic function.  She was last seen in the office on 12/07/2023 reported intermittent palpitations with associated chest tightness and shortness of breath.  Outpatient cardiac monitor in 11/2023 revealed predominantly normal sinus rhythm, sinus tachycardia, occasional PACs, short runs of SVT, occasional PVCs, 9 beats NSVT.  Repeat echocardiogram was ordered and is pending.   She presents today for follow-up accompanied by her husband.  Since her last visit she has   1. Chest pain/shortness of breath/palpitations: Approximately 1-2 times a week she will have episodes of feeling overwhelmed with associated diaphoresis and shakiness, she feels her heart is pounding.  She will have associated shortness of breath and chest tightness.  Symptoms last for 10 minutes or less.  She also notes shortness of breath both at rest and with activity.  She has stable intermittent dizziness (reported history of vertigo).  She denies any presyncope, syncope.  She will note intermittent fleeting chest discomfort which she describes as a shock, this lasts for only a second and  resolves spontaneously. She denies exertional chest pain.  Per PCP, troponin and d.dimer have been negative, EKG without acute changes. Euvolemic and well compensated on exam.  Will check 14-day ZIO monitor to assess for recurrent atrial fibrillation, rule out other significant arrhythmia.  Will update echo.  If workup unrevealing, consider need for ischemic evaluation.  We discussed possible coronary CTA, however she does have a history of significant claustrophobia, she would likely require premedication.  Otherwise, could consider stress test.  Reviewed ED precautions.    2. Paroxysmal atrial fibrillation: Diagnosed in 05/2022.  S/p DCCV on 07/03/2022. Maintaining NSR.  Recent palpitations as above.  14-day ZIO, echo pending as above.  Continue Eliquis , metoprolol .   3. Chronic diastolic heart failure: Most recent echo in 06/2022 showed EF 60 to 65%, normal LV function, no RWMA, G2 DD, normal RV systolic function.  Recent episodic shortness of breath as well as mild shortness of breath with exertion as above.  Euvolemic and well compensated on exam. Continue metoprolol , Lasix .   4. Cardiomyopathy: Prior evidence of cardiomyopathy consistent with noncompaction.  Recent echo stable as above, repeat echo pending in the setting of shortness of breath. Continue current medications as above, including Eliquis  (Crenshaw's prior notes, history of CVA possibly secondary to intracranial vascular disease versus noncompaction).  No family history of sudden death. Stable chronic intermittent dizziness likely in the setting of vertigo. Denies presyncope, syncope.   5. Hypertension: BP well controlled. Continue current antihypertensive regimen.    6. Hyperlipidemia: LDL was 55 in 07/2022. Will repeat fasting lipids, CMET. Continue Crestor .    7. History of CVA: Continue Eliquis , Crestor .    8.  Type 2 diabetes: A1c was 6.2 in 10/2023.  Monitor managed per PCP.   9.  Anemia: Hemoglobin was 10.4 in 10/2023.  Monitored and managed per PCP.   8. Disposition: Follow-up in  Home Medications    Current Outpatient Medications  Medication Sig Dispense Refill   acetaminophen  (TYLENOL ) 650 MG CR tablet Take 1,300 mg by mouth every 8 (eight) hours as needed for pain.     apixaban  (ELIQUIS ) 5 MG TABS tablet Take 1 tablet (5 mg total) by mouth 2 (two) times daily. 180 tablet 1   Cholecalciferol (VITAMIN D -3) 125 MCG (5000 UT) TABS Take 5,000 Units by mouth daily.     CINNAMON PO Take 500 mg by mouth daily.     diazepam  (VALIUM ) 5 MG tablet Take 30-40 minutes prior to MRI. 1 tablet 0    furosemide  (LASIX ) 20 MG tablet TAKE 1 TABLET BY MOUTH ONCE DAILY AND MAY TAKE AN EXTRA TABLET AS NEEDED FOR SHORTNESS OF BREATH OR WEIGHT GAIN 60 tablet 0   gabapentin  (NEURONTIN ) 800 MG tablet TAKE 1 TABLET BY MOUTH TWICE DAILY 180 tablet 1   hydroxychloroquine  (PLAQUENIL ) 200 MG tablet Take 200 mg by mouth 2 (two) times daily.     losartan  (COZAAR ) 50 MG tablet TAKE 1 TABLET BY MOUTH EVERY MORNING 30 tablet 10   Magnesium 250 MG TABS Take 250 mg by mouth daily.     metFORMIN  (GLUCOPHAGE ) 500 MG tablet TAKE 1 TABLET BY MOUTH EVERY DAY AT BEDTIME 30 tablet 10   metoprolol  succinate (TOPROL -XL) 50 MG 24 hr tablet Take 1 tablet (50 mg total) by mouth daily. Take with or immediately following a meal 90 tablet 0   omeprazole  (PRILOSEC) 40 MG capsule TAKE 1 CAPSULE BY MOUTH ONCE DAILY 30 capsule 10   ondansetron  (ZOFRAN ) 4 MG tablet Take 1 tablet (  4 mg total) by mouth every 8 (eight) hours as needed. 30 tablet 0   rosuvastatin  (CRESTOR ) 20 MG tablet Take 1 tablet by mouth once daily 90 tablet 0   Semaglutide , 1 MG/DOSE, (OZEMPIC , 1 MG/DOSE,) 4 MG/3ML SOPN Inject 1 mg into the skin once a week. 9 mL 0   sucralfate  (CARAFATE ) 1 g tablet Take 1 tablet (1 g total) by mouth 4 (four) times daily -  with meals and at bedtime. As needed 40 tablet 1   traMADol  (ULTRAM ) 50 MG tablet Take 1 tablet (50 mg total) by mouth every 6 (six) hours as needed for moderate pain. 30 tablet 0   Vitamin A 2400 MCG (8000 UT) CAPS Take 2,400 Units by mouth daily.     No current facility-administered medications for this visit.     Review of Systems    ***.  All other systems reviewed and are otherwise negative except as noted above.    Physical Exam    VS:  There were no vitals taken for this visit. , BMI There is no height or weight on file to calculate BMI.     GEN: Well nourished, well developed, in no acute distress. HEENT: normal. Neck: Supple, no JVD, carotid bruits, or masses. Cardiac: RRR, no murmurs, rubs,  or gallops. No clubbing, cyanosis, edema.  Radials/DP/PT 2+ and equal bilaterally.  Respiratory:  Respirations regular and unlabored, clear to auscultation bilaterally. GI: Soft, nontender, nondistended, BS + x 4. MS: no deformity or atrophy. Skin: warm and dry, no rash. Neuro:  Strength and sensation are intact. Psych: Normal affect.  Accessory Clinical Findings    ECG personally reviewed by me today -    - no acute changes.   Lab Results  Component Value Date   WBC 5.3 11/05/2023   HGB 10.4 (L) 11/05/2023   HCT 32.0 (L) 11/05/2023   MCV 77.1 (L) 11/05/2023   PLT 238.0 11/05/2023   Lab Results  Component Value Date   CREATININE 0.81 12/07/2023   BUN 12 12/07/2023   NA 141 12/07/2023   K 4.1 12/07/2023   CL 102 12/07/2023   CO2 24 12/07/2023   Lab Results  Component Value Date   ALT 22 12/07/2023   AST 26 12/07/2023   ALKPHOS 98 12/07/2023   BILITOT <0.2 12/07/2023   Lab Results  Component Value Date   CHOL 126 12/07/2023   HDL 53 12/07/2023   LDLCALC 57 12/07/2023   TRIG 78 12/07/2023   CHOLHDL 2.4 12/07/2023    Lab Results  Component Value Date   HGBA1C 6.2 11/05/2023    Assessment & Plan    1.  ***  No BP recorded.  {Refresh Note OR Click here to enter BP  :1}***   Damien JAYSON Braver, NP 01/19/2024, 5:51 AM

## 2024-01-22 ENCOUNTER — Other Ambulatory Visit: Payer: Self-pay | Admitting: Family Medicine

## 2024-01-29 DIAGNOSIS — M329 Systemic lupus erythematosus, unspecified: Secondary | ICD-10-CM | POA: Diagnosis not present

## 2024-01-29 DIAGNOSIS — Z79899 Other long term (current) drug therapy: Secondary | ICD-10-CM | POA: Diagnosis not present

## 2024-01-29 LAB — BASIC METABOLIC PANEL WITH GFR
BUN: 15 (ref 4–21)
CO2: 21 (ref 13–22)
Chloride: 102 (ref 99–108)
Creatinine: 0.9 (ref 0.5–1.1)
Glucose: 81
Potassium: 4.2 meq/L (ref 3.5–5.1)
Sodium: 142 (ref 137–147)

## 2024-01-29 LAB — CBC AND DIFFERENTIAL
HCT: 38 (ref 36–46)
Hemoglobin: 10.9 — AB (ref 12.0–16.0)
Neutrophils Absolute: 4.4
Platelets: 258 K/uL (ref 150–400)
WBC: 5.8

## 2024-01-29 LAB — COMPREHENSIVE METABOLIC PANEL WITH GFR
Albumin: 4.3 (ref 3.5–5.0)
Calcium: 9.7 (ref 8.7–10.7)
Globulin: 2.6
eGFR: 68

## 2024-01-29 LAB — HEPATIC FUNCTION PANEL
ALT: 17 U/L (ref 7–35)
AST: 25 (ref 13–35)
Alkaline Phosphatase: 102 (ref 25–125)
Bilirubin, Total: 0.2

## 2024-01-29 LAB — CBC: RBC: 4.83 (ref 3.87–5.11)

## 2024-02-01 ENCOUNTER — Encounter: Payer: Self-pay | Admitting: Family Medicine

## 2024-02-02 ENCOUNTER — Ambulatory Visit (INDEPENDENT_AMBULATORY_CARE_PROVIDER_SITE_OTHER)

## 2024-02-02 VITALS — Ht 63.0 in | Wt 169.0 lb

## 2024-02-02 DIAGNOSIS — Z0000 Encounter for general adult medical examination without abnormal findings: Secondary | ICD-10-CM

## 2024-02-02 NOTE — Patient Instructions (Addendum)
 [name] , Thank you for taking time out of your busy schedule to complete your Annual Wellness Visit with me. I enjoyed our conversation and look forward to speaking with you again next year. I, as well as your care team,  appreciate your ongoing commitment to your health goals. Please review the following plan we discussed and let me know if I can assist you in the future. Your Game plan/ To Do List    Referrals: If you haven't heard from the office you've been referred to, please reach out to them at the phone provided.    Follow up Visits: Next Medicare AWV with our clinical staff: 02/07/25 @ 10:10   Have you seen your provider in the last 6 months (3 months if uncontrolled diabetes)? 12/09/23 Next Office Visit with your provider:   Clinician Recommendations:  Aim for 30 minutes of exercise or brisk walking, 6-8 glasses of water, and 5 servings of fruits and vegetables each day.       This is a list of the screening recommended for you and due dates:  Health Maintenance  Topic Date Due   Yearly kidney health urinalysis for diabetes  Never done   Zoster (Shingles) Vaccine (2 of 2) 01/16/2024   COVID-19 Vaccine (8 - 2024-25 season) 01/24/2025*   Flu Shot  02/19/2024   Eye exam for diabetics  03/25/2024   Hemoglobin A1C  05/06/2024   Complete foot exam   06/21/2024   Yearly kidney function blood test for diabetes  01/28/2025   Medicare Annual Wellness Visit  02/01/2025   Mammogram  11/29/2025   DTaP/Tdap/Td vaccine (2 - Td or Tdap) 03/12/2026   Colon Cancer Screening  11/26/2031   Pneumococcal Vaccine for age over 11  Completed   DEXA scan (bone density measurement)  Completed   Hepatitis C Screening  Completed   Hepatitis B Vaccine  Aged Out   HPV Vaccine  Aged Out   Meningitis B Vaccine  Aged Out  *Topic was postponed. The date shown is not the original due date.    Advanced directives: (Provided) Advance directive discussed with you today. I have provided a copy for you  to complete at home and have notarized. Once this is complete, please bring a copy in to our office so we can scan it into your chart.  Advance Care Planning is important because it:  [x]  Makes sure you receive the medical care that is consistent with your values, goals, and preferences  [x]  It provides guidance to your family and loved ones and reduces their decisional burden about whether or not they are making the right decisions based on your wishes.  Follow the link provided in your after visit summary or read over the paperwork we have mailed to you to help you started getting your Advance Directives in place. If you need assistance in completing these, please reach out to us  so that we can help you!  See attachments for Preventive Care and Fall Prevention Tips.

## 2024-02-02 NOTE — Progress Notes (Addendum)
 Subjective:   Amanda Saunders is a 68 y.o. who presents for a Medicare Wellness preventive visit.  As a reminder, Annual Wellness Visits don't include a physical exam, and some assessments may be limited, especially if this visit is performed virtually. We may recommend an in-person follow-up visit with your provider if needed.  Visit Complete: Virtual I connected with  Amanda Saunders on 02/02/24 by a audio enabled telemedicine application and verified that I am speaking with the correct person using two identifiers.  Patient Location: Home  Provider Location: Home Office  I discussed the limitations of evaluation and management by telemedicine. The patient expressed understanding and agreed to proceed.  Vital Signs: Because this visit was a virtual/telehealth visit, some criteria may be missing or patient reported. Any vitals not documented were not able to be obtained and vitals that have been documented are patient reported.    Persons Participating in Visit: Patient.  AWV Questionnaire: Yes: Patient Medicare AWV questionnaire was completed by the patient on 02/01/24; I have confirmed that all information answered by patient is correct and no changes since this date.  Cardiac Risk Factors include: advanced age (>41men, >86 women);diabetes mellitus;hypertension     Objective:    Today's Vitals   02/02/24 1017  Weight: 169 lb (76.7 kg)  Height: 5' 3 (1.6 m)   Body mass index is 29.94 kg/m.     02/02/2024   10:26 AM 08/14/2023    8:44 AM 05/14/2023   10:42 AM 03/03/2023    2:47 PM 01/29/2023    3:43 PM 12/17/2022    3:25 PM 12/17/2022    6:29 AM  Advanced Directives  Does Patient Have a Medical Advance Directive? No No No No No No No  Would patient like information on creating a medical advance directive? No - Patient declined   No - Patient declined Yes (MAU/Ambulatory/Procedural Areas - Information given) No - Patient declined No - Patient declined    Current  Medications (verified) Outpatient Encounter Medications as of 02/02/2024  Medication Sig   acetaminophen  (TYLENOL ) 650 MG CR tablet Take 1,300 mg by mouth every 8 (eight) hours as needed for pain.   apixaban  (ELIQUIS ) 5 MG TABS tablet Take 1 tablet (5 mg total) by mouth 2 (two) times daily.   Cholecalciferol (VITAMIN D -3) 125 MCG (5000 UT) TABS Take 5,000 Units by mouth daily.   CINNAMON PO Take 500 mg by mouth daily.   diazepam  (VALIUM ) 5 MG tablet Take 30-40 minutes prior to MRI.   furosemide  (LASIX ) 20 MG tablet TAKE 1 TABLET BY MOUTH ONCE DAILY AND MAY TAKE AN EXTRA TABLET AS NEEDED FOR SHORTNESS OF BREATH OR WEIGHT GAIN   gabapentin  (NEURONTIN ) 800 MG tablet TAKE 1 TABLET BY MOUTH TWICE DAILY   hydroxychloroquine  (PLAQUENIL ) 200 MG tablet Take 200 mg by mouth 2 (two) times daily.   losartan  (COZAAR ) 50 MG tablet TAKE 1 TABLET BY MOUTH EVERY MORNING   Magnesium 250 MG TABS Take 250 mg by mouth daily.   metFORMIN  (GLUCOPHAGE ) 500 MG tablet TAKE 1 TABLET BY MOUTH EVERY DAY AT BEDTIME   metoprolol  succinate (TOPROL -XL) 50 MG 24 hr tablet Take 1 tablet (50 mg total) by mouth daily. Take with or immediately following a meal   omeprazole  (PRILOSEC) 40 MG capsule TAKE 1 CAPSULE BY MOUTH ONCE DAILY   ondansetron  (ZOFRAN ) 4 MG tablet Take 1 tablet (4 mg total) by mouth every 8 (eight) hours as needed.   rosuvastatin  (CRESTOR ) 20 MG tablet  Take 1 tablet (20 mg total) by mouth daily.   Semaglutide , 1 MG/DOSE, (OZEMPIC , 1 MG/DOSE,) 4 MG/3ML SOPN Inject 1 mg into the skin once a week.   sucralfate  (CARAFATE ) 1 g tablet Take 1 tablet (1 g total) by mouth 4 (four) times daily -  with meals and at bedtime. As needed   traMADol  (ULTRAM ) 50 MG tablet Take 1 tablet (50 mg total) by mouth every 6 (six) hours as needed for moderate pain.   Vitamin A 2400 MCG (8000 UT) CAPS Take 2,400 Units by mouth daily.   No facility-administered encounter medications on file as of 02/02/2024.    Allergies  (verified) Pholcodine, Codeine, and Oxycodone    History: Past Medical History:  Diagnosis Date   Anemia    Aortic atherosclerosis (HCC)    Arthritis    Atrial fibrillation (HCC)    Cardiomyopathy (HCC)    CHF (congestive heart failure) (HCC)    Diabetes mellitus without complication (HCC)    Dyspnea    Dysrhythmia    GERD (gastroesophageal reflux disease)    Headache    Heart murmur    at birth   Hypertension    Internal hemorrhoids    Lupus    PUD (peptic ulcer disease)    Stroke (HCC) 08/13/2018   Vertigo    Past Surgical History:  Procedure Laterality Date   BACK SURGERY     CARDIOVERSION N/A 07/03/2022   Procedure: CARDIOVERSION;  Surgeon: Kate Lonni CROME, MD;  Location: [facility] ENDOSCOPY;  Service: Cardiovascular;  Laterality: N/A;   CARPAL TUNNEL RELEASE Bilateral    COLONOSCOPY     IRRIGATION AND DEBRIDEMENT KNEE Left 03/04/2023   Procedure: Left knee irrigation and debridement, wound closure;  Surgeon: Fidel Rogue, MD;  Location: [facility] ORS;  Service: Orthopedics;  Laterality: Left;   KNEE ARTHROPLASTY Left 12/17/2022   Procedure: COMPUTER ASSISTED TOTAL KNEE ARTHROPLASTY;  Surgeon: Fidel Rogue, MD;  Location: [facility] ORS;  Service: Orthopedics;  Laterality: Left;   KNEE CARTILAGE SURGERY Left    MENISECTOMY Left    NECK SURGERY     2015   UPPER GASTROINTESTINAL ENDOSCOPY     Family History  Problem Relation Age of Onset   Hypertension Mother    Arthritis Mother    Heart failure Mother    Stroke Mother    Kidney disease Father    Hypertension Father    Colon cancer Neg Hx    Esophageal cancer Neg Hx    Rectal cancer Neg Hx    Stomach cancer Neg Hx    Social History   Socioeconomic History   Marital status: Married    Spouse name: Not on file   Number of children: 1   Years of education: 12   Highest education level: Not on file  Occupational History   Occupation: retired    Comment: retired  Tobacco Use   Smoking status: Former    Current  packs/day: 0.00    Types: Cigarettes    Quit date: 07/21/2002    Years since quitting: 21.5   Smokeless tobacco: Never  Vaping Use   Vaping status: Never Used  Substance and Sexual Activity   Alcohol  use: Not Currently    Comment: occasional   Drug use: No   Sexual activity: Not on file  Other Topics Concern   Not on file  Social History Narrative   Lives one level with son/grandchildren; Left handed; high school grad; retired; walks for exercise; no caffeine   Social Drivers of  Health   Financial Resource Strain: Medium Risk (02/02/2024)   Overall Financial Resource Strain (CARDIA)    Difficulty of Paying Living Expenses: Somewhat hard  Food Insecurity: Food Insecurity Present (02/02/2024)   Hunger Vital Sign    Worried About Running Out of Food in the Last Year: Sometimes true    Ran Out of Food in the Last Year: Never true  Transportation Needs: No Transportation Needs (02/02/2024)   PRAPARE - Administrator, Civil Service (Medical): No    Lack of Transportation (Non-Medical): No  Physical Activity: Inactive (02/02/2024)   Exercise Vital Sign    Days of Exercise per Week: 0 days    Minutes of Exercise per Session: 0 min  Stress: No Stress Concern Present (02/02/2024)   [employer] of Occupational Health - Occupational Stress Questionnaire    Feeling of Stress: Not at all  Social Connections: Moderately Isolated (02/02/2024)   Social Connection and Isolation Panel    Frequency of Communication with Friends and Family: More than three times a week    Frequency of Social Gatherings with Friends and Family: Three times a week    Attends Religious Services: More than 4 times per year    Active Member of Clubs or Organizations: No    Attends Engineer, structural: Not on file    Marital Status: Never married    Tobacco Counseling Counseling given: Not Answered    Clinical Intake:  Pre-visit preparation completed: Yes  Pain : No/denies pain      BMI - recorded: 29.94 Nutritional Status: BMI 25 -29 Overweight Nutritional Risks: None Diabetes: Yes CBG done?: No Did pt. bring in CBG monitor from home?: No  Lab Results  Component Value Date   HGBA1C 6.2 11/05/2023   HGBA1C 6.4 05/06/2023   HGBA1C 5.9 (H) 12/04/2022     How often do you need to have someone help you when you read instructions, pamphlets, or other written materials from your doctor or pharmacy?: 1 - Never  Interpreter Needed?: No  Information entered by :: Rojelio Blush LPN   Activities of Daily Living     02/02/2024   10:25 AM 02/01/2024    1:01 PM  In your present state of health, do you have any difficulty performing the following activities:  Hearing? 0 0  Vision? 0 0  Difficulty concentrating or making decisions? 0 0  Walking or climbing stairs? 0 0  Dressing or bathing? 0 0  Doing errands, shopping? 0 0  Preparing Food and eating ? N N  Using the Toilet? N N  In the past six months, have you accidently leaked urine? N N  Do you have problems with loss of bowel control? N N  Managing your Medications? N N  Managing your Finances? N N  Housekeeping or managing your Housekeeping? N N    Patient Care Team: Copland, Harlene BROCKS, MD as PCP - General (Family Medicine) Pietro Redell RAMAN, MD as PCP - Cardiology (Cardiology) Skeet Juliene SAUNDERS, DO as Consulting Physician (Neurology) Elnor Rocky PARAS, PA-C (Inactive) (Rheumatology)  I have updated your Care Teams any recent Medical Services you may have received from other providers in the past year.     Assessment:   This is a routine wellness examination for Amanda Saunders.  Hearing/Vision screen Hearing Screening - Comments:: Denies hearing difficulties   Vision Screening - Comments:: Wears rx glasses - up to date with routine eye exams with  [facility]   Goals Addressed  This Visit's Progress     Increase physical activity (pt-stated)         Depression Screen     02/02/2024   10:20  AM 01/29/2023    3:42 PM 05/13/2022    9:53 AM 01/02/2022    1:10 PM 09/23/2021    9:53 AM 07/29/2021   11:11 AM 01/28/2021    3:10 PM  PHQ 2/9 Scores  PHQ - 2 Score 0 0 0 0 0 0 0    Fall Risk     02/02/2024   10:25 AM 02/01/2024    1:01 PM 08/14/2023    8:44 AM 05/14/2023   10:42 AM 01/29/2023    3:41 PM  Fall Risk   Falls in the past year? 0 0 0 0 1  Number falls in past yr: 0 0 0 0 0  Injury with Fall? 0 0 0 0 0  Risk for fall due to : No Fall Risks    No Fall Risks  Follow up Falls evaluation completed  Falls evaluation completed Falls evaluation completed Falls evaluation completed    MEDICARE RISK AT HOME:  Medicare Risk at Home Any stairs in or around the home?: Yes If so, are there any without handrails?: Yes Home free of loose throw rugs in walkways, pet beds, electrical cords, etc?: No Adequate lighting in your home to reduce risk of falls?: Yes Life alert?: No Use of a cane, walker or w/c?: No Grab bars in the bathroom?: No Shower chair or bench in shower?: No Elevated toilet seat or a handicapped toilet?: No  TIMED UP AND GO:  Was the test performed?  No  Cognitive Function: 6CIT completed    05/14/2016   10:47 AM  MMSE - Mini Mental State Exam  Orientation to time 5   Orientation to Place 5   Registration 3   Attention/ Calculation 4   Recall 3   Language- name 2 objects 2   Language- repeat 1  Language- follow 3 step command 3   Language- read & follow direction 1   Write a sentence 1   Copy design 1   Total score 29      Data saved with a previous flowsheet row definition        02/02/2024   10:26 AM 01/29/2023    3:45 PM 01/02/2022    1:15 PM  6CIT Screen  What Year? 0 points 0 points 0 points  What month? 0 points 0 points 0 points  What time? 0 points 0 points 0 points  Count back from 20 0 points 0 points 0 points  Months in reverse 0 points 0 points 0 points  Repeat phrase 0 points 0 points 0 points  Total Score 0 points 0 points 0  points    Immunizations Immunization History  Administered Date(s) Administered   Moderna Covid-19 Fall Seasonal Vaccine 61yrs & older 04/10/2022, 11/07/2022   Moderna Sars-Covid-2 Vaccination 11/17/2019, 12/15/2019, 07/06/2020   PNEUMOCOCCAL CONJUGATE-20 01/28/2021, 10/16/2022   Pfizer(Comirnaty )Fall Seasonal Vaccine 12 years and older 06/10/2023, 11/21/2023   Pneumococcal Conjugate-13 01/28/2021   Respiratory Syncytial Virus Vaccine,Recomb Aduvanted(Arexvy) 07/25/2022   Tdap 03/12/2016   Zoster Recombinant(Shingrix) 11/21/2023    Screening Tests Health Maintenance  Topic Date Due   Diabetic kidney evaluation - Urine ACR  Never done   Zoster Vaccines- Shingrix (2 of 2) 01/16/2024   COVID-19 Vaccine (8 - 2024-25 season) 01/24/2025 (Originally 01/16/2024)   INFLUENZA VACCINE  02/19/2024   OPHTHALMOLOGY EXAM  03/25/2024  HEMOGLOBIN A1C  05/06/2024   FOOT EXAM  06/21/2024   Diabetic kidney evaluation - eGFR measurement  01/28/2025   Medicare Annual Wellness (AWV)  02/01/2025   MAMMOGRAM  11/29/2025   DTaP/Tdap/Td (2 - Td or Tdap) 03/12/2026   Colonoscopy  11/26/2031   Pneumococcal Vaccine: 50+ Years  Completed   DEXA SCAN  Completed   Hepatitis C Screening  Completed   Hepatitis B Vaccines  Aged Out   HPV VACCINES  Aged Out   Meningococcal B Vaccine  Aged Out    Health Maintenance  Health Maintenance Due  Topic Date Due   Diabetic kidney evaluation - Urine ACR  Never done   Zoster Vaccines- Shingrix (2 of 2) 01/16/2024   Health Maintenance Items Addressed:   Additional Screening:  Vision Screening: Recommended annual ophthalmology exams for early detection of glaucoma and other disorders of the eye. Would you like a referral to an eye doctor? No    Dental Screening: Recommended annual dental exams for proper oral hygiene  Community Resource Referral / Chronic Care Management: CRR required this visit?  No   CCM required this visit?  No   Plan:    I have  personally reviewed and noted the following in the patient's chart:   Medical and social history Use of alcohol , tobacco or illicit drugs  Current medications and supplements including opioid prescriptions. Patient is not currently taking opioid prescriptions. Functional ability and status Nutritional status Physical activity Advanced directives List of other physicians Hospitalizations, surgeries, and ER visits in previous 12 months Vitals Screenings to include cognitive, depression, and falls Referrals and appointments  In addition, I have reviewed and discussed with patient certain preventive protocols, quality metrics, and best practice recommendations. A written personalized care plan for preventive services as well as general preventive health recommendations were provided to patient.   Rojelio LELON Blush, LPN   2/84/7974   After Visit Summary: (MyChart) Due to this being a telephonic visit, the after visit summary with patients personalized plan was offered to patient via MyChart   Notes: Nothing significant to report at this time.

## 2024-02-19 ENCOUNTER — Ambulatory Visit: Payer: 59 | Admitting: Neurology

## 2024-02-26 DIAGNOSIS — M329 Systemic lupus erythematosus, unspecified: Secondary | ICD-10-CM | POA: Diagnosis not present

## 2024-03-02 DIAGNOSIS — M79641 Pain in right hand: Secondary | ICD-10-CM | POA: Diagnosis not present

## 2024-03-02 DIAGNOSIS — M329 Systemic lupus erythematosus, unspecified: Secondary | ICD-10-CM | POA: Diagnosis not present

## 2024-03-02 DIAGNOSIS — M79674 Pain in right toe(s): Secondary | ICD-10-CM | POA: Diagnosis not present

## 2024-03-02 DIAGNOSIS — M25511 Pain in right shoulder: Secondary | ICD-10-CM | POA: Diagnosis not present

## 2024-03-02 DIAGNOSIS — R945 Abnormal results of liver function studies: Secondary | ICD-10-CM | POA: Diagnosis not present

## 2024-03-02 DIAGNOSIS — Z79899 Other long term (current) drug therapy: Secondary | ICD-10-CM | POA: Diagnosis not present

## 2024-03-02 DIAGNOSIS — M1991 Primary osteoarthritis, unspecified site: Secondary | ICD-10-CM | POA: Diagnosis not present

## 2024-03-03 ENCOUNTER — Ambulatory Visit (HOSPITAL_COMMUNITY)
Admission: RE | Admit: 2024-03-03 | Discharge: 2024-03-03 | Disposition: A | Discharge status: Home / Self Care | Source: Ambulatory Visit | Attending: Nurse Practitioner | Admitting: Nurse Practitioner

## 2024-03-03 DIAGNOSIS — R0602 Shortness of breath: Secondary | ICD-10-CM | POA: Insufficient documentation

## 2024-03-03 DIAGNOSIS — I5032 Chronic diastolic (congestive) heart failure: Secondary | ICD-10-CM | POA: Diagnosis not present

## 2024-03-03 DIAGNOSIS — R072 Precordial pain: Secondary | ICD-10-CM | POA: Diagnosis not present

## 2024-03-03 LAB — ECHOCARDIOGRAM COMPLETE
Area-P 1/2: 3.17 cm2
S' Lateral: 3.55 cm

## 2024-03-08 ENCOUNTER — Telehealth: Payer: Self-pay | Admitting: Cardiology

## 2024-03-08 NOTE — Telephone Encounter (Signed)
 Pt was calling into the office to ask about her echo results.  She states she saw the results on mychart and saw some concerning findings, after she looked them up.   Pt aware that Damien Braver NP still needs to place her final interpretation on her echo results, and once she does we will call her accordingly thereafter.   Pt is aware that the report she is seeing on mychart is a preliminary report.   Pt aware I will forward this message to Damien Braver, NP and covering CMA for further review and follow-up.  Pt verbalized understanding and agrees with this plan.

## 2024-03-08 NOTE — Telephone Encounter (Signed)
Pt is requesting a callback regarding her ECHO results. Please advise.

## 2024-03-14 NOTE — Telephone Encounter (Signed)
 Results have been discussed with pt. See echo report for documentation.

## 2024-03-24 ENCOUNTER — Other Ambulatory Visit: Payer: Self-pay | Admitting: Family Medicine

## 2024-03-24 DIAGNOSIS — R110 Nausea: Secondary | ICD-10-CM

## 2024-03-25 ENCOUNTER — Ambulatory Visit: Admitting: Nurse Practitioner

## 2024-03-29 NOTE — Progress Notes (Deleted)
 [facility] [address] [place], [name] [address] [phone]  Date:  03/30/2024   Name:  Amanda Saunders   DOB:  04-08-1956   MRN:  [id number]  PCP:  Watt Harlene BROCKS, MD    Chief Complaint: No chief complaint on file.   History of Present Illness:  Amanda Saunders is a 68 y.o. very pleasant female patient who presents with the following:  Patient seen today with concern of illness.  I saw her most recently in April-she was experiencing some atypical chest pain; EKG and lab work reassuring.  D-dimer was normal She saw cardiology in May and had an echo performed last month: 1. There are significant trabecualtions in the LV apex. Left ventricular ejection fraction, by estimation, is 45 to 50%. Left ventricular ejection fraction by 3D volume is 51 %. The left ventricle has mildly decreased function. The left ventricle has no   regional wall motion abnormalities. There is mild concentric left ventricular hypertrophy. Left ventricular diastolic parameters are consistent with Grade I diastolic dysfunction (impaired relaxation).   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  Patient notes her are lupus and RA symptoms are stable  Eye exam Needs urine micro Needs flu shot, COVID booster, second dose of Shingrix if not done already  Eliquis  5 twice daily Lasix  20 mg once daily, can take a second dose if needed Gabapentin  800 twice daily Plaquenil  200 mg daily Losartan  50 Toprol  XL 50 Ozempic  1 mg Metformin  500  Lab Results  Component Value Date   HGBA1C 6.2 11/05/2023     Patient Active Problem List   Diagnosis Date  Noted   Osteoarthritis of left knee 12/17/2022   S/P total knee arthroplasty, left 12/17/2022   Persistent atrial fibrillation (HCC) 07/03/2022   Long term current use of anticoagulant therapy 06/06/2022   Dysphagia 06/06/2022   Nausea and vomiting 06/06/2022   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   Systemic lupus erythematosus (HCC) 08/04/2019   Acute ischemic stroke (HCC) 08/14/2018   HTN (hypertension) 08/14/2018   Controlled type 2 diabetes mellitus without complication, without long-term current use of insulin  (HCC) 04/03/2016   History of CVA (cerebrovascular accident) 03/12/2016   Chest pain at rest 03/12/2016   Peripheral edema 03/12/2016    Past Medical History:  Diagnosis Date   Anemia    Aortic atherosclerosis (HCC)    Arthritis    Atrial fibrillation (HCC)    Cardiomyopathy (HCC)    CHF (congestive heart failure) (HCC)    Diabetes mellitus without complication (HCC)    Dyspnea    Dysrhythmia    GERD (gastroesophageal reflux disease)    Headache    Heart murmur    at birth   Hypertension    Internal hemorrhoids    Lupus    PUD (peptic ulcer disease)    Stroke (HCC) 08/13/2018   Vertigo     Past Surgical History:  Procedure Laterality Date   BACK SURGERY     CARDIOVERSION N/A 07/03/2022   Procedure: CARDIOVERSION;  Surgeon: Kate Lonni CROME, MD;  Location: [facility] ENDOSCOPY;  Service: Cardiovascular;  Laterality: N/A;   CARPAL TUNNEL RELEASE Bilateral    COLONOSCOPY     IRRIGATION AND DEBRIDEMENT  KNEE Left 03/04/2023   Procedure: Left knee irrigation and debridement, wound closure;  Surgeon: Fidel Rogue, MD;  Location: [facility] ORS;  Service: Orthopedics;  Laterality: Left;   KNEE ARTHROPLASTY Left 12/17/2022   Procedure: COMPUTER ASSISTED TOTAL KNEE ARTHROPLASTY;  Surgeon: Fidel Rogue, MD;  Location: [facility] ORS;  Service: Orthopedics;  Laterality: Left;   KNEE CARTILAGE SURGERY Left    MENISECTOMY Left    NECK SURGERY     2015   UPPER  GASTROINTESTINAL ENDOSCOPY      Social History   Tobacco Use   Smoking status: Former    Current packs/day: 0.00    Types: Cigarettes    Quit date: 07/21/2002    Years since quitting: 21.7   Smokeless tobacco: Never  Vaping Use   Vaping status: Never Used  Substance Use Topics   Alcohol  use: Not Currently    Comment: occasional   Drug use: No    Family History  Problem Relation Age of Onset   Hypertension Mother    Arthritis Mother    Heart failure Mother    Stroke Mother    Kidney disease Father    Hypertension Father    Colon cancer Neg Hx    Esophageal cancer Neg Hx    Rectal cancer Neg Hx    Stomach cancer Neg Hx     Allergies  Allergen Reactions   Pholcodine Nausea Only   Codeine Nausea And Vomiting    ALL FORMS   Oxycodone  Nausea And Vomiting    Medication list has been reviewed and updated.  Current Outpatient Medications on File Prior to Visit  Medication Sig Dispense Refill   acetaminophen  (TYLENOL ) 650 MG CR tablet Take 1,300 mg by mouth every 8 (eight) hours as needed for pain.     apixaban  (ELIQUIS ) 5 MG TABS tablet Take 1 tablet (5 mg total) by mouth 2 (two) times daily. 180 tablet 1   Cholecalciferol (VITAMIN D -3) 125 MCG (5000 UT) TABS Take 5,000 Units by mouth daily.     CINNAMON PO Take 500 mg by mouth daily.     diazepam  (VALIUM ) 5 MG tablet Take 30-40 minutes prior to MRI. 1 tablet 0   furosemide  (LASIX ) 20 MG tablet TAKE 1 TABLET BY MOUTH ONCE DAILY AND MAY TAKE AN EXTRA TABLET AS NEEDED FOR SHORTNESS OF BREATH OR WEIGHT GAIN 60 tablet 0   gabapentin  (NEURONTIN ) 800 MG tablet TAKE 1 TABLET BY MOUTH TWICE DAILY 180 tablet 1   hydroxychloroquine  (PLAQUENIL ) 200 MG tablet Take 200 mg by mouth 2 (two) times daily.     losartan  (COZAAR ) 50 MG tablet TAKE 1 TABLET BY MOUTH EVERY MORNING 30 tablet 10   Magnesium 250 MG TABS Take 250 mg by mouth daily.     metFORMIN  (GLUCOPHAGE ) 500 MG tablet TAKE 1 TABLET BY MOUTH EVERY DAY AT BEDTIME 30 tablet 10    metoprolol  succinate (TOPROL -XL) 50 MG 24 hr tablet Take 1 tablet (50 mg total) by mouth daily. Take with or immediately following a meal 90 tablet 0   omeprazole  (PRILOSEC) 40 MG capsule TAKE 1 CAPSULE BY MOUTH ONCE DAILY 30 capsule 10   ondansetron  (ZOFRAN ) 4 MG tablet TAKE 1 TABLET BY MOUTH EVERY 8 HOURS AS NEEDED 30 tablet 0   rosuvastatin  (CRESTOR ) 20 MG tablet Take 1 tablet (20 mg total) by mouth daily. 90 tablet 0   Semaglutide , 1 MG/DOSE, (OZEMPIC , 1 MG/DOSE,) 4 MG/3ML SOPN Inject 1 mg into the skin once a week. 9 mL 0  sucralfate  (CARAFATE ) 1 g tablet Take 1 tablet (1 g total) by mouth 4 (four) times daily -  with meals and at bedtime. As needed 40 tablet 1   traMADol  (ULTRAM ) 50 MG tablet Take 1 tablet (50 mg total) by mouth every 6 (six) hours as needed for moderate pain. 30 tablet 0   Vitamin A 2400 MCG (8000 UT) CAPS Take 2,400 Units by mouth daily.     No current facility-administered medications on file prior to visit.    Review of Systems:  As per HPI- otherwise negative.   Physical Examination: There were no vitals filed for this visit. There were no vitals filed for this visit. There is no height or weight on file to calculate BMI. Ideal Body Weight:    GEN: no acute distress. HEENT: Atraumatic, Normocephalic.  Ears and Nose: No external deformity. CV: RRR, No M/G/R. No JVD. No thrill. No extra heart sounds. PULM: CTA B, no wheezes, crackles, rhonchi. No retractions. No resp. distress. No accessory muscle use. ABD: S, NT, ND, +BS. No rebound. No HSM. EXTR: No c/c/e PSYCH: Normally interactive. Conversant.    Assessment and Plan: No diagnosis found.  Assessment & Plan   Signed Harlene Schroeder, MD

## 2024-03-29 NOTE — Patient Instructions (Incomplete)
 It was good to see you again today Once you are feeling better please to be sure to get a flu shot and COVID booster this fall You may also due for your eye exam

## 2024-03-30 ENCOUNTER — Ambulatory Visit: Admitting: Family Medicine

## 2024-03-30 DIAGNOSIS — I428 Other cardiomyopathies: Secondary | ICD-10-CM

## 2024-03-30 DIAGNOSIS — I10 Essential (primary) hypertension: Secondary | ICD-10-CM

## 2024-03-30 DIAGNOSIS — E119 Type 2 diabetes mellitus without complications: Secondary | ICD-10-CM

## 2024-03-30 DIAGNOSIS — I4819 Other persistent atrial fibrillation: Secondary | ICD-10-CM

## 2024-03-31 ENCOUNTER — Other Ambulatory Visit: Payer: Self-pay | Admitting: Family Medicine

## 2024-03-31 DIAGNOSIS — R1013 Epigastric pain: Secondary | ICD-10-CM

## 2024-04-04 ENCOUNTER — Other Ambulatory Visit: Payer: Self-pay | Admitting: Family Medicine

## 2024-04-04 DIAGNOSIS — I428 Other cardiomyopathies: Secondary | ICD-10-CM

## 2024-04-06 ENCOUNTER — Other Ambulatory Visit: Payer: Self-pay | Admitting: Family Medicine

## 2024-04-06 DIAGNOSIS — I428 Other cardiomyopathies: Secondary | ICD-10-CM

## 2024-04-13 DIAGNOSIS — M329 Systemic lupus erythematosus, unspecified: Secondary | ICD-10-CM | POA: Diagnosis not present

## 2024-04-15 NOTE — Progress Notes (Deleted)
 Amanda Saunders [address] Amanda Saunders, [name] [address] [phone]  Date:  04/18/2024   Name:  Amanda Saunders   DOB:  Nov 06, 1955   MRN:  [id number]  PCP:  Amanda Harlene BROCKS, MD    Chief Complaint: No chief complaint on file.   History of Present Illness:  Amanda Saunders is a 68 y.o. very pleasant female patient who presents with the following:  Patient seen today concerning some upcoming dental work.  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  Patient notes her are lupus and RA symptoms are stable  She was seen by cardiology in May 1. Chest pain/shortness of breath/palpitations: Approximately 1-2 times a week she will have episodes of feeling overwhelmed with associated diaphoresis and shakiness, she feels her heart is pounding.  She will have associated shortness of breath and chest tightness.  Symptoms last for 10 minutes or less.  She also notes shortness of breath both at rest and with activity.  She has stable intermittent dizziness (reported history of vertigo).  She denies any presyncope, syncope.  She will note intermittent fleeting chest discomfort which she describes as a shock, this lasts for only a second and resolves spontaneously. She denies exertional chest pain.  Per PCP, troponin and d.dimer have been negative, EKG without acute changes. Euvolemic and well compensated on exam.  Will check 14-day ZIO monitor to assess for recurrent atrial fibrillation, rule out other significant arrhythmia.  Will update echo.  If workup unrevealing, consider need for ischemic evaluation.  We discussed possible coronary CTA, however she does have a history of significant claustrophobia, she would  likely require premedication.  Otherwise, could consider stress test.  Reviewed ED precautions. 2. Paroxysmal atrial fibrillation: Diagnosed in 05/2022.  S/p DCCV on 07/03/2022. Maintaining NSR.  Recent palpitations as above.  14-day ZIO, echo pending as above.  Continue Eliquis , metoprolol . 3. Chronic diastolic heart failure: Most recent echo in 06/2022 showed EF 60 to 65%, normal LV function, no RWMA, G2 DD, normal RV systolic function.  Recent episodic shortness of breath as well as mild shortness of breath with exertion as above.  Euvolemic and well compensated on exam. Continue metoprolol , Lasix . 4. Cardiomyopathy: Prior evidence of cardiomyopathy consistent with noncompaction.  Recent echo stable as above, repeat echo pending in the setting of shortness of breath. Continue current medications as above, including Eliquis  ([facility]'s prior notes, history of CVA possibly secondary to intracranial vascular disease versus noncompaction).  No family history of sudden death. Stable chronic intermittent dizziness likely in the setting of vertigo. Denies presyncope, syncope.   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   Systemic lupus erythematosus (HCC) 08/04/2019   Acute ischemic stroke (HCC) 08/14/2018   HTN (hypertension) 08/14/2018   Controlled type 2 diabetes mellitus without complication, without  long-term current use of insulin  (HCC) 04/03/2016   History of CVA (cerebrovascular accident) 03/12/2016   Chest pain at rest 03/12/2016    Peripheral edema 03/12/2016    Past Medical History:  Diagnosis Date   Anemia    Aortic atherosclerosis    Arthritis    Atrial fibrillation (HCC)    Cardiomyopathy (HCC)    CHF (congestive heart failure) (HCC)    Diabetes mellitus without complication (HCC)    Dyspnea    Dysrhythmia    GERD (gastroesophageal reflux disease)    Headache    Heart murmur    at birth   Hypertension    Internal hemorrhoids    Lupus    PUD (peptic ulcer disease)    Stroke (HCC) 08/13/2018   Vertigo     Past Surgical History:  Procedure Laterality Date   BACK SURGERY     CARDIOVERSION N/A 07/03/2022   Procedure: CARDIOVERSION;  Surgeon: Kate Lonni CROME, MD;  Location: [facility] ENDOSCOPY;  Service: Cardiovascular;  Laterality: N/A;   CARPAL TUNNEL RELEASE Bilateral    COLONOSCOPY     IRRIGATION AND DEBRIDEMENT KNEE Left 03/04/2023   Procedure: Left knee irrigation and debridement, wound closure;  Surgeon: Fidel Rogue, MD;  Location: [facility] ORS;  Service: Orthopedics;  Laterality: Left;   KNEE ARTHROPLASTY Left 12/17/2022   Procedure: COMPUTER ASSISTED TOTAL KNEE ARTHROPLASTY;  Surgeon: Fidel Rogue, MD;  Location: [facility] ORS;  Service: Orthopedics;  Laterality: Left;   KNEE CARTILAGE SURGERY Left    MENISECTOMY Left    NECK SURGERY     2015   UPPER GASTROINTESTINAL ENDOSCOPY      Social History   Tobacco Use   Smoking status: Former    Current packs/day: 0.00    Types: Cigarettes    Quit date: 07/21/2002    Years since quitting: 21.7   Smokeless tobacco: Never  Vaping Use   Vaping status: Never Used  Substance Use Topics   Alcohol  use: Not Currently    Comment: occasional   Drug use: No    Family History  Problem Relation Age of Onset   Hypertension Mother    Arthritis Mother    Heart failure Mother    Stroke Mother    Kidney disease Father    Hypertension Father    Colon cancer Neg Hx    Esophageal cancer Neg Hx    Rectal cancer Neg Hx    Stomach cancer Neg Hx      Allergies  Allergen Reactions   Pholcodine Nausea Only   Codeine Nausea And Vomiting    ALL FORMS   Oxycodone  Nausea And Vomiting    Medication list has been reviewed and updated.  Current Outpatient Medications on File Prior to Visit  Medication Sig Dispense Refill   acetaminophen  (TYLENOL ) 650 MG CR tablet Take 1,300 mg by mouth every 8 (eight) hours as needed for pain.     apixaban  (ELIQUIS ) 5 MG TABS tablet Take 1 tablet (5 mg total) by mouth 2 (two) times daily. 180 tablet 1   Cholecalciferol (VITAMIN D -3) 125 MCG (5000 UT) TABS Take 5,000 Units by mouth daily.     CINNAMON PO Take 500 mg by mouth daily.     furosemide  (LASIX ) 20 MG tablet TAKE 1 TABLET BY MOUTH ONCE DAILY AND MAY TAKE AN EXTRA TABLET AS NEEDED FOR SHORTNESS OF BREATH OR WEIGHT GAIN 60 tablet 0   gabapentin  (NEURONTIN ) 800 MG tablet TAKE 1 TABLET BY MOUTH TWICE DAILY 180 tablet 1  hydroxychloroquine  (PLAQUENIL ) 200 MG tablet Take 200 mg by mouth 2 (two) times daily.     losartan  (COZAAR ) 50 MG tablet Take 1 tablet (50 mg total) by mouth every morning. Needs appt 30 tablet 0   Magnesium 250 MG TABS Take 250 mg by mouth daily.     metFORMIN  (GLUCOPHAGE ) 500 MG tablet TAKE 1 TABLET BY MOUTH EVERY DAY AT BEDTIME 30 tablet 10   metoprolol  succinate (TOPROL -XL) 50 MG 24 hr tablet Take 1 tablet (50 mg total) by mouth daily. Take with or immediately following a meal 90 tablet 0   omeprazole  (PRILOSEC) 40 MG capsule Take 1 capsule (40 mg total) by mouth daily. 90 capsule 0   ondansetron  (ZOFRAN ) 4 MG tablet TAKE 1 TABLET BY MOUTH EVERY 8 HOURS AS NEEDED 30 tablet 0   rosuvastatin  (CRESTOR ) 20 MG tablet Take 1 tablet (20 mg total) by mouth daily. 90 tablet 0   Semaglutide , 1 MG/DOSE, (OZEMPIC , 1 MG/DOSE,) 4 MG/3ML SOPN Inject 1 mg into the skin once a week. 9 mL 0   sucralfate  (CARAFATE ) 1 g tablet Take 1 tablet (1 g total) by mouth 4 (four) times daily -  with meals and at bedtime. As needed 40 tablet 1   traMADol   (ULTRAM ) 50 MG tablet Take 1 tablet (50 mg total) by mouth every 6 (six) hours as needed for moderate pain. 30 tablet 0   Vitamin A 2400 MCG (8000 UT) CAPS Take 2,400 Units by mouth daily.     No current facility-administered medications on file prior to visit.    Review of Systems:  As per HPI- otherwise negative.   Physical Examination: There were no vitals filed for this visit. There were no vitals filed for this visit. There is no height or weight on file to calculate BMI. Ideal Body Weight:    GEN: no acute distress. HEENT: Atraumatic, Normocephalic.  Ears and Nose: No external deformity. CV: RRR, No M/G/R. No JVD. No thrill. No extra heart sounds. PULM: CTA B, no wheezes, crackles, rhonchi. No retractions. No resp. distress. No accessory muscle use. ABD: S, NT, ND, +BS. No rebound. No HSM. EXTR: No c/c/e PSYCH: Normally interactive. Conversant.    Assessment and Plan: No diagnosis found.  Assessment & Plan   Signed Harlene Schroeder, MD

## 2024-04-18 ENCOUNTER — Ambulatory Visit: Admitting: Family Medicine

## 2024-04-24 NOTE — Progress Notes (Deleted)
 [facility] [address], [address] [place], [name] [address] [phone]  Date:  04/27/2024   Name:  Amanda Saunders   DOB:  [birth year]   MRN:  [id number]  PCP:  Watt Harlene BROCKS, MD    Chief Complaint: No chief complaint on file.   History of Present Illness:  Amanda Saunders is a 67 y.o. very pleasant female patient who presents with the following:  Patient seen today concerning some upcoming dental work.  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  Patient notes her are lupus and RA symptoms are stable  She was seen by cardiology in May 1. Chest pain/shortness of breath/palpitations: Approximately 1-2 times a week she will have episodes of feeling overwhelmed with associated diaphoresis and shakiness, she feels her heart is pounding.  She will have associated shortness of breath and chest tightness.  Symptoms last for 10 minutes or less.  She also notes shortness of breath both at rest and with activity.  She has stable intermittent dizziness (reported history of vertigo).  She denies any presyncope, syncope.  She will note intermittent fleeting chest discomfort which she describes as a shock, this lasts for only a second and resolves spontaneously. She denies exertional chest pain.  Per PCP, troponin and d.dimer have been negative, EKG without acute changes. Euvolemic and well compensated on exam.  Will check 14-day ZIO monitor to assess for recurrent atrial fibrillation, rule out other significant arrhythmia.  Will update echo.  If workup unrevealing, consider need for ischemic evaluation.  We discussed possible coronary CTA, however she does have a history of significant claustrophobia, she would  likely require premedication.  Otherwise, could consider stress test.  Reviewed ED precautions. 2. Paroxysmal atrial fibrillation: Diagnosed in 05/2022.  S/p DCCV on 07/03/2022. Maintaining NSR.  Recent palpitations as above.  14-day ZIO, echo pending as above.  Continue Eliquis , metoprolol . 3. Chronic diastolic heart failure: Most recent echo in 06/2022 showed EF 60 to 65%, normal LV function, no RWMA, G2 DD, normal RV systolic function.  Recent episodic shortness of breath as well as mild shortness of breath with exertion as above.  Euvolemic and well compensated on exam. Continue metoprolol , Lasix . 4. Cardiomyopathy: Prior evidence of cardiomyopathy consistent with noncompaction.  Recent echo stable as above, repeat echo pending in the setting of shortness of breath. Continue current medications as above, including Eliquis  ([facility]'s prior notes, history of CVA possibly secondary to intracranial vascular disease versus noncompaction).  No family history of sudden death. Stable chronic intermittent dizziness likely in the setting of vertigo. Denies presyncope, syncope.   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   Systemic lupus erythematosus (HCC) 08/04/2019   Acute ischemic stroke (HCC) 08/14/2018   HTN (hypertension) 08/14/2018   Controlled type 2 diabetes mellitus without complication, without  long-term current use of insulin  (HCC) 04/03/2016   History of CVA (cerebrovascular accident) 03/12/2016   Chest pain at rest 03/12/2016    Peripheral edema 03/12/2016    Past Medical History:  Diagnosis Date   Anemia    Aortic atherosclerosis    Arthritis    Atrial fibrillation (HCC)    Cardiomyopathy (HCC)    CHF (congestive heart failure) (HCC)    Diabetes mellitus without complication (HCC)    Dyspnea    Dysrhythmia    GERD (gastroesophageal reflux disease)    Headache    Heart murmur    at birth   Hypertension    Internal hemorrhoids    Lupus    PUD (peptic ulcer disease)    Stroke (HCC) 08/13/2018   Vertigo     Past Surgical History:  Procedure Laterality Date   BACK SURGERY     CARDIOVERSION N/A 07/03/2022   Procedure: CARDIOVERSION;  Surgeon: Kate Lonni CROME, MD;  Location: [facility] ENDOSCOPY;  Service: Cardiovascular;  Laterality: N/A;   CARPAL TUNNEL RELEASE Bilateral    COLONOSCOPY     IRRIGATION AND DEBRIDEMENT KNEE Left 03/04/2023   Procedure: Left knee irrigation and debridement, wound closure;  Surgeon: Fidel Rogue, MD;  Location: [facility] ORS;  Service: Orthopedics;  Laterality: Left;   KNEE ARTHROPLASTY Left 12/17/2022   Procedure: COMPUTER ASSISTED TOTAL KNEE ARTHROPLASTY;  Surgeon: Fidel Rogue, MD;  Location: [facility] ORS;  Service: Orthopedics;  Laterality: Left;   KNEE CARTILAGE SURGERY Left    MENISECTOMY Left    NECK SURGERY     2015   UPPER GASTROINTESTINAL ENDOSCOPY      Social History   Tobacco Use   Smoking status: Former    Current packs/day: 0.00    Types: Cigarettes    Quit date: 07/21/2002    Years since quitting: 21.7   Smokeless tobacco: Never  Vaping Use   Vaping status: Never Used  Substance Use Topics   Alcohol  use: Not Currently    Comment: occasional   Drug use: No    Family History  Problem Relation Age of Onset   Hypertension Mother    Arthritis Mother    Heart failure Mother    Stroke Mother    Kidney disease Father    Hypertension Father    Colon cancer Neg Hx    Esophageal cancer Neg Hx    Rectal cancer Neg Hx    Stomach cancer Neg Hx      Allergies  Allergen Reactions   Pholcodine Nausea Only   Codeine Nausea And Vomiting    ALL FORMS   Oxycodone  Nausea And Vomiting    Medication list has been reviewed and updated.  Current Outpatient Medications on File Prior to Visit  Medication Sig Dispense Refill   acetaminophen  (TYLENOL ) 650 MG CR tablet Take 1,300 mg by mouth every 8 (eight) hours as needed for pain.     apixaban  (ELIQUIS ) 5 MG TABS tablet Take 1 tablet (5 mg total) by mouth 2 (two) times daily. 180 tablet 1   Cholecalciferol (VITAMIN D -3) 125 MCG (5000 UT) TABS Take 5,000 Units by mouth daily.     CINNAMON PO Take 500 mg by mouth daily.     furosemide  (LASIX ) 20 MG tablet TAKE 1 TABLET BY MOUTH ONCE DAILY AND MAY TAKE AN EXTRA TABLET AS NEEDED FOR SHORTNESS OF BREATH OR WEIGHT GAIN 60 tablet 0   gabapentin  (NEURONTIN ) 800 MG tablet TAKE 1 TABLET BY MOUTH TWICE DAILY 180 tablet 1  hydroxychloroquine  (PLAQUENIL ) 200 MG tablet Take 200 mg by mouth 2 (two) times daily.     losartan  (COZAAR ) 50 MG tablet Take 1 tablet (50 mg total) by mouth every morning. Needs appt 30 tablet 0   Magnesium 250 MG TABS Take 250 mg by mouth daily.     metFORMIN  (GLUCOPHAGE ) 500 MG tablet TAKE 1 TABLET BY MOUTH EVERY DAY AT BEDTIME 30 tablet 10   metoprolol  succinate (TOPROL -XL) 50 MG 24 hr tablet Take 1 tablet (50 mg total) by mouth daily. Take with or immediately following a meal 90 tablet 0   omeprazole  (PRILOSEC) 40 MG capsule Take 1 capsule (40 mg total) by mouth daily. 90 capsule 0   ondansetron  (ZOFRAN ) 4 MG tablet TAKE 1 TABLET BY MOUTH EVERY 8 HOURS AS NEEDED 30 tablet 0   rosuvastatin  (CRESTOR ) 20 MG tablet Take 1 tablet (20 mg total) by mouth daily. 90 tablet 0   Semaglutide , 1 MG/DOSE, (OZEMPIC , 1 MG/DOSE,) 4 MG/3ML SOPN Inject 1 mg into the skin once a week. 9 mL 0   sucralfate  (CARAFATE ) 1 g tablet Take 1 tablet (1 g total) by mouth 4 (four) times daily -  with meals and at bedtime. As needed 40 tablet 1   traMADol   (ULTRAM ) 50 MG tablet Take 1 tablet (50 mg total) by mouth every 6 (six) hours as needed for moderate pain. 30 tablet 0   Vitamin A 2400 MCG (8000 UT) CAPS Take 2,400 Units by mouth daily.     No current facility-administered medications on file prior to visit.    Review of Systems:  As per HPI- otherwise negative.   Physical Examination: There were no vitals filed for this visit. There were no vitals filed for this visit. There is no height or weight on file to calculate BMI. Ideal Body Weight:    GEN: no acute distress. HEENT: Atraumatic, Normocephalic.  Ears and Nose: No external deformity. CV: RRR, No M/G/R. No JVD. No thrill. No extra heart sounds. PULM: CTA B, no wheezes, crackles, rhonchi. No retractions. No resp. distress. No accessory muscle use. ABD: S, NT, ND, +BS. No rebound. No HSM. EXTR: No c/c/e PSYCH: Normally interactive. Conversant.    Assessment and Plan: No diagnosis found.  Assessment & Plan   Signed Harlene Schroeder, MD

## 2024-04-25 ENCOUNTER — Ambulatory Visit: Admitting: Physician Assistant

## 2024-04-25 NOTE — Progress Notes (Deleted)
 Cardiology Office Note:  .   Date:  04/25/2024  ID:  Amanda Saunders, DOB 20-Jan-1956, MRN [id number] PCP: Watt Harlene BROCKS, MD  [facility] Providers Cardiologist:  Redell Shallow, MD { Click to update primary MD,subspecialty MD or APP then REFRESH:1}   History of Present Illness: .   Amanda Saunders is a 68 y.o. 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   Nuclear study November 2017 showed no infarct or ischemia, mildly reduced LV function. Echocardiogram in July 2019 showed EF 45 to 50%, prominent apical trabeculation concerning for noncompaction and mild tricuspid regurgitation. 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 Carotid Dopplers in January 2020 showed 1 to 39% bilateral carotid artery stenosis. Cardiac MRI in 02/2019 showed EF 44%, finding consistent with noncompaction, mild LAE, mild AI, and MR. Echocardiogram in September 2022 showed EF 50 to 55%, apical trabeculations suggestive of elevated noncompaction.  Patient was seen 11/2023 for chest pain. Echo ordered and reviewed by Dr. Shallow and felt to be no change. EF 45-50%, monitor showed 2 NSVT and 3 SVT. No changes made.    ROS: ***  Studies Reviewed: SABRA         Prior CV Studies: {Select studies to display:26339}  Echo 02/2024 IMPRESSIONS     1. There are significant trabecualtions in the LV apex. Left ventricular  ejection fraction, by estimation, is 45 to 50%. Left ventricular ejection  fraction by 3D volume is 51 %. The left ventricle has mildly decreased  function. The left ventricle has no   regional wall motion abnormalities. There is mild concentric left  ventricular hypertrophy. Left ventricular diastolic parameters are  consistent with Grade I diastolic dysfunction (impaired relaxation).   2. Right ventricular  systolic function is normal. The right ventricular  size is normal.   3. Left atrial size was mildly dilated.   4. The mitral valve is normal in structure. Mild mitral valve  regurgitation. No evidence of mitral stenosis.   5. The aortic valve is tricuspid. There is mild calcification of the  aortic valve. Aortic valve regurgitation is not visualized. Aortic valve  sclerosis/calcification is present, without any evidence of aortic  stenosis.   6. The inferior vena cava is normal in size with greater than 50%  respiratory variability, suggesting right atrial pressure of 3 mmHg.   Conclusion(s)/Recommendation(s): There are significant trabecualtions in  the LV apex. Consdier cardiac MRI to further evalaute for noncompaction.   Zio 12/2023 Patch Wear Time:  12 days and 17 hours (2025-05-21T23:32:51-0400 to 2025-06-03T16:41:27-0400)   Patient had a min HR of 61 bpm, max HR of 211 bpm, and avg HR of 80 bpm. Predominant underlying rhythm was Sinus Rhythm. 2 Ventricular Tachycardia runs occurred, the run with the fastest interval lasting 9 beats with a max rate of 211 bpm, the longest  lasting 9 beats with an avg rate of 146 bpm. 3 Supraventricular Tachycardia runs occurred, the run with the fastest interval lasting 7 beats with a max rate of 150 bpm, the longest lasting 7 beats with an avg rate of 129 bpm. Isolated SVEs were rare  (<1.0%), SVE Couplets were rare (<1.0%), and SVE Triplets were rare (<1.0%). Isolated VEs were rare (<1.0%), VE Couplets were rare (<1.0%), and no VE Triplets were present.    Normal sinus rhythm, sinus tachycardia, occasional PAC, short runs of  SVT, occasional PVC and couplet and 9 beats nonsustained ventricular tachycardia. Redell Shallow  Risk Assessment/Calculations:   {Does this patient have ATRIAL FIBRILLATION?:[phone]} No BP recorded.  {Refresh Note OR Click here to enter BP  :1}***       Physical Exam:   VS:  There were no vitals taken for this visit.    Orhtostatics: No data found. Wt Readings from Last 3 Encounters:  02/02/24 169 lb (76.7 kg)  12/07/23 169 lb (76.7 kg)  11/05/23 169 lb 12.8 oz (77 kg)    GEN: Well nourished, well developed in no acute distress NECK: No JVD; No carotid bruits CARDIAC: ***RRR, no murmurs, rubs, gallops RESPIRATORY:  Clear to auscultation without rales, wheezing or rhonchi  ABDOMEN: Soft, non-tender, non-distended EXTREMITIES:  No edema; No deformity   ASSESSMENT AND PLAN: .    Paroxysmal atrial fibrillation: Diagnosed in 05/2022.  S/p DCCV on 07/03/2022. Maintaining NSR.  Recent palpitations 14-day ZIO, some SVT/NSVT no changes made.  Continue Eliquis , metoprolol .  02/2024  EF 45-50%    Cardiomyopathy: Prior evidence of cardiomyopathy consistent with noncompaction.  Recent echo stable as above, . Continue current medications as above, including Eliquis  ([facility]'s prior notes, history of CVA possibly secondary to intracranial vascular disease versus noncompaction).  No family history of sudden death.     Hypertension: BP well controlled. Continue current antihypertensive regimen.     Hyperlipidemia: LDL was 55 in 07/2022. Will repeat fasting lipids, CMET. Continue Crestor .    History of CVA: Continue Eliquis , Crestor .    Type 2 diabetes: A1c was 6.2 in 10/2023.  Monitor managed per PCP.   Anemia: Hemoglobin was 10.4 in 10/2023.  Monitored and managed per PCP.       {Are you ordering a CV Procedure (e.g. stress test, cath, DCCV, TEE, etc)?   Press F2        :[id number]}  Dispo: ***  Signed, Olivia Pavy, PA-C

## 2024-04-27 ENCOUNTER — Ambulatory Visit: Admitting: Family Medicine

## 2024-05-02 ENCOUNTER — Ambulatory Visit: Admitting: Physician Assistant

## 2024-05-02 NOTE — Progress Notes (Addendum)
 MAYETTA CASTLEMAN                                          MRN: [id number]   05/02/2024   The [facility] Quality Team Specialist reviewed this patient medical record for the purposes of chart review for care gap closure. The following were reviewed: chart review for care gap closure-kidney health evaluation for diabetes:eGFR  and uACR.  06/28/2024- Abstracted ked labs.   [facility] Quality Team

## 2024-05-04 ENCOUNTER — Other Ambulatory Visit: Payer: Self-pay | Admitting: Family Medicine

## 2024-05-13 DIAGNOSIS — M329 Systemic lupus erythematosus, unspecified: Secondary | ICD-10-CM | POA: Diagnosis not present

## 2024-05-16 NOTE — Progress Notes (Deleted)
 Cardiology Office Note:    Date:  05/16/2024   ID:  Amanda Saunders, DOB [birth year], MRN [id number]  PCP:  Amanda Amanda BROCKS, MD   [name] Cardiologist:  Amanda Shallow, MD { Click to update primary MD,subspecialty MD or APP then REFRESH:1}    Referring MD: Copland, Amanda BROCKS, MD   No chief complaint on file. ***  History of Present Illness:    Amanda Saunders is a 68 y.o. 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   Nuclear study November 2017 showed no infarct or ischemia, mildly reduced LV function. Echocardiogram in July 2019 showed EF 45 to 50%, prominent apical trabeculation concerning for noncompaction and mild tricuspid regurgitation. 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 Carotid Dopplers in January 2020 showed 1 to 39% bilateral carotid artery stenosis. Cardiac MRI in 02/2019 showed EF 44%, finding consistent with noncompaction, mild LAE, mild AI, and MR. Echocardiogram in September 2022 showed EF 50 to 55%, apical trabeculations suggestive of elevated noncompaction.  Patient was seen 11/2023 for chest pain. Echo ordered and reviewed by Dr. Shallow and felt to be no change. EF 45-50%, monitor showed 2 NSVT and 3 SVT. No changes made.     Paroxysmal atrial fibrillation: Diagnosed in 05/2022.  S/p DCCV on 07/03/2022. Maintaining NSR.  Recent palpitations 14-day ZIO, some SVT/NSVT no changes made.  Continue Eliquis , metoprolol .  02/2024  EF 45-50%    Cardiomyopathy: Prior evidence of cardiomyopathy consistent with noncompaction.  Recent echo stable as above, . Continue current medications as above, including Eliquis  (Crenshaw's prior notes, history of CVA possibly secondary to intracranial vascular disease versus noncompaction).  No family history of sudden death.      Hypertension: BP well controlled. Continue current antihypertensive regimen.     Hyperlipidemia: LDL was 55 in 07/2022. Will repeat fasting lipids, CMET. Continue Crestor .    History of CVA: Continue Eliquis , Crestor .    Type 2 diabetes: A1c was 6.2 in 10/2023.  Monitor managed per PCP.   Anemia: Hemoglobin was 10.4 in 10/2023.  Monitored and managed per PCP.     Past Medical History:  Diagnosis Date   Anemia    Aortic atherosclerosis    Arthritis    Atrial fibrillation (HCC)    Cardiomyopathy (HCC)    CHF (congestive heart failure) (HCC)    Diabetes mellitus without complication (HCC)    Dyspnea    Dysrhythmia    GERD (gastroesophageal reflux disease)    Headache    Heart murmur    at birth   Hypertension    Internal hemorrhoids    Lupus    PUD (peptic ulcer disease)    Stroke (HCC) 08/13/2018   Vertigo     Past Surgical History:  Procedure Laterality Date   BACK SURGERY     CARDIOVERSION N/A 07/03/2022   Procedure: CARDIOVERSION;  Surgeon: Amanda Lonni CROME, MD;  Location: [facility] ENDOSCOPY;  Service: Cardiovascular;  Laterality: N/A;   CARPAL TUNNEL RELEASE Bilateral    COLONOSCOPY     IRRIGATION AND DEBRIDEMENT KNEE Left 03/04/2023   Procedure: Left knee irrigation and debridement, wound closure;  Surgeon: Fidel Redell, MD;  Location: [facility] ORS;  Service: Orthopedics;  Laterality: Left;   KNEE ARTHROPLASTY Left 12/17/2022   Procedure: COMPUTER ASSISTED TOTAL KNEE ARTHROPLASTY;  Surgeon: Fidel Redell, MD;  Location: [facility] ORS;  Service:  Orthopedics;  Laterality: Left;   KNEE CARTILAGE SURGERY Left    MENISECTOMY Left    NECK SURGERY     2015   UPPER GASTROINTESTINAL ENDOSCOPY      Current Medications: No outpatient medications have been marked as taking for the 05/18/24 encounter (Appointment) with Amanda Jon Garre, PA.     Allergies:   Pholcodine, Codeine, and Oxycodone    Social History   Socioeconomic History   Marital status: Married    Spouse  name: Not on file   Number of children: 1   Years of education: 12   Highest education level: Not on file  Occupational History   Occupation: retired    Comment: retired  Tobacco Use   Smoking status: Former    Current packs/day: 0.00    Types: Cigarettes    Quit date: 07/21/2002    Years since quitting: 21.8   Smokeless tobacco: Never  Vaping Use   Vaping status: Never Used  Substance and Sexual Activity   Alcohol  use: Not Currently    Comment: occasional   Drug use: No   Sexual activity: Not on file  Other Topics Concern   Not on file  Social History Narrative   Lives one level with son/grandchildren; Left handed; high school grad; retired; walks for exercise; no caffeine   Social Drivers of Corporate Investment Banker Strain: Medium Risk (02/02/2024)   Overall Financial Resource Strain (CARDIA)    Difficulty of Paying Living Expenses: Somewhat hard  Food Insecurity: Food Insecurity Present (02/02/2024)   Hunger Vital Sign    Worried About Running Out of Food in the Last Year: Sometimes true    Ran Out of Food in the Last Year: Never true  Transportation Needs: No Transportation Needs (02/02/2024)   PRAPARE - Administrator, Civil Service (Medical): No    Lack of Transportation (Non-Medical): No  Physical Activity: Inactive (02/02/2024)   Exercise Vital Sign    Days of Exercise per Week: 0 days    Minutes of Exercise per Session: 0 min  Stress: No Stress Concern Present (02/02/2024)   [employer] of Occupational Health - Occupational Stress Questionnaire    Feeling of Stress: Not at all  Social Connections: Moderately Isolated (02/02/2024)   Social Connection and Isolation Panel    Frequency of Communication with Friends and Family: More than three times a week    Frequency of Social Gatherings with Friends and Family: Three times a week    Attends Religious Services: More than 4 times per year    Active Member of Clubs or Organizations: No    Attends  Engineer, Structural: Not on file    Marital Status: Never married     Family History: The patient's ***family history includes Arthritis in her mother; Heart failure in her mother; Hypertension in her father and mother; Kidney disease in her father; Stroke in her mother. There is no history of Colon cancer, Esophageal cancer, Rectal cancer, or Stomach cancer.  ROS:   Please see the history of present illness.    *** All other systems reviewed and are negative.  EKGs/Labs/Other Studies Reviewed:    The following studies were reviewed today: ***      Recent Labs: 06/22/2023: TSH 3.08 01/29/2024: ALT 17; BUN 15; Creatinine 0.9; Hemoglobin 10.9; Platelets 258; Potassium 4.2; Sodium 142  Recent Lipid Panel    Component Value Date/Time   CHOL 126 12/07/2023 0958   TRIG 78 12/07/2023 0958  HDL 53 12/07/2023 0958   CHOLHDL 2.4 12/07/2023 0958   CHOLHDL 3 06/08/2020 0925   VLDL 15.2 06/08/2020 0925   LDLCALC 57 12/07/2023 0958     Risk Assessment/Calculations:   {Does this patient have ATRIAL FIBRILLATION?:[id number]}  No BP recorded.  {Refresh Note OR Click here to enter BP  :1}***         Physical Exam:    VS:  There were no vitals taken for this visit.    Wt Readings from Last 3 Encounters:  02/02/24 169 lb (76.7 kg)  12/07/23 169 lb (76.7 kg)  11/05/23 169 lb 12.8 oz (77 kg)     GEN: *** Well nourished, well developed in no acute distress HEENT: Normal NECK: No JVD; No carotid bruits LYMPHATICS: No lymphadenopathy CARDIAC: ***RRR, no murmurs, rubs, gallops RESPIRATORY:  Clear to auscultation without rales, wheezing or rhonchi  ABDOMEN: Soft, non-tender, non-distended MUSCULOSKELETAL:  No edema; No deformity  SKIN: Warm and dry NEUROLOGIC:  Alert and oriented x 3 PSYCHIATRIC:  Normal affect   ASSESSMENT:    No diagnosis found. PLAN:    In order of problems listed above:  ***      {Are you ordering a CV Procedure (e.g. stress test, cath,  DCCV, TEE, etc)?   Press F2        :[id number]}    Medication Adjustments/Labs and Tests Ordered: Current medicines are reviewed at length with the patient today.  Concerns regarding medicines are outlined above.  No orders of the defined types were placed in this encounter.  No orders of the defined types were placed in this encounter.   There are no Patient Instructions on file for this visit.   Signed, Jon Nat Hails, PA  05/16/2024 11:35 AM    [facility]

## 2024-05-18 ENCOUNTER — Ambulatory Visit: Admitting: Physician Assistant

## 2024-05-20 ENCOUNTER — Other Ambulatory Visit: Payer: Self-pay | Admitting: Family Medicine

## 2024-05-20 DIAGNOSIS — E119 Type 2 diabetes mellitus without complications: Secondary | ICD-10-CM

## 2024-05-20 NOTE — Progress Notes (Deleted)
 Cardiology Office Note:    Date:  05/20/2024   ID:  Amanda Saunders, DOB 1955/12/31, MRN [id number]  PCP:  Amanda Amanda BROCKS, MD  Cardiologist:  Redell Shallow, MD { Click to update primary MD,subspecialty MD or APP then REFRESH:1}    Referring MD: Copland, Amanda BROCKS, MD   Chief Complaint: follow-up of chest pain, shortness of breath, and palpitations   History of Present Illness:    Amanda Saunders is a 68 y.o. 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 Shallow and presents today for follow-up of chest pain, shortness of breath, and palpitations.   Patient was initially referred to Dr. Shallow in 2017 for chest pain and dyspnea. ETT and Echo were ordered for further evaluation. ETT was non-diagnostic. Echo showed LVEF of 40-45% with diffuse hypokinesis, grade 1 diastolic dysfunction, mild MR, and mild TR. Also showed possible signs of noncompaction at the LV apex. Nuclear stress test and cardiac MRI were then ordered. Myoview  showed in 05/2016 showed no evidence of prior infarct or ischemia. Cardiac MRI in 05/2016 showed prominent mid to apical LV trabeculation concerning for LV noncompaction but no myocardial LGE to suggest prior MI, myocarditis, or infiltrative disease. She had CVA in 07/2018. Carotid dopplers at that time showed mild stenosis (1-39%) of bilateral ICAs. Repeat cardiac MRI in 02/2019 showed mild LV enlargement with diffuse hypokinesis, EF 44%, with evidence of ventricular noncompaction. She was subsequently diagnosed with atrial fibrillation in 05/2022 and underwent successful DCCV in 06/2022 with restoration of sinus rhythm. Echo at that time showed LVEF of 60-65%.  She was last seen by Damien Braver, NP, in 11/2023 at which time she reported episodes of  feeling overwhelmed with associated diaphoresis and shakiness as well as a feeling of her heart pounding. She reported associated shortness of breath and chest tightness with this. These episodes occurred 1-2 times per week and would last <10 minutes. She also reported intermittent fleeting chest discomfort that she describes as a shock that would only last for a second at a time and then resolve spontaneously. 2 week Zio monitor and Echo were ordered for further evaluation. Coronary CTA was offered but patient has significant claustrophobia and so decision was made to hold off on this. Monitor showed 2 short runs of NSVT (longest run lasting 9 beats), 2 runs of SVT (longest run 7 beats), and rare PACs/ PVCs but no significant arrhthymias. Echo showed LVEF of 45-50% with no regional wall motion abnormalities, mild concentric LVH, grade 1 diastolic dysfunction, and significant trabeculations in the LV apex as well as normal RV function, mild MR, and no significant valvular disease.   Patient presents today for follow-up. ***  Chest Pain  Non-Compaction Cardiomyopathy Chronic HFmrEF Prior cardiac MRI in 02/2019 confirmed ventricular non-compaction. Last Echo in 02/2024 showed LVEF of 45-50% with no regional wall motion abnormalities, mild concentric LVH, grade 1 diastolic dysfunction, and significant trabeculations in the LV apex as well as normal RV function, mild MR, and no significant valvular disease.  - *** - Continue Lasix  20mg  daily. Can take an extra dose as needed for weight gain or edema.  - Continue Losartan  50mg  daily.  - Continue Toprol -XL 50mg  daily.  - Discussed importance of daily weights and sodium restrictions.  Palpitations Paroxysmal Atrial Fibrillation Diagnosed in 05/2022 and underwent successful  DCCV in 06/2022. She reported palpitations that she described as pounding at last visit in 11/2023. Monitor showed a couple of short runs both NSVT and SVT and rare PACs/ PVCs but no  significant arrhythmias.  - Maintaining sinus rhythm on exam. *** - Continue Toprol -XL 50mg  daily.  - Continue Eliquis  5mg  twice daily.   Mild Mitral Regurgitation Noted on Echo in 02/2024.  - Can continue routine monitor with serial Echos per guidelines.   Hypertension BP well controlled. *** - Will treat in context of CHF as above.  Hyperlipidemia Lipid panel in 11/2023: Total Cholesterol 126, Triglycerides 78, HDL 53, LDL 57.  - Continue Crestor  20mg  daily.   Type 2 Diabetes Mellitus Hemoglobin A1c 6.2% in 10/2023.  - On Metformin  and Ozempic .  - Management per PCP.  History of CVA History of CVA in 07/2018. Felt to possibly be secondary to intracranial vascular disease vs non-compaction. Subsequently diagnosed with paroxysmal atrial fibrillation in 2023 as well.  - Continue Eliquis  and Crestor .    EKGs/Labs/Other Studies Reviewed:    The following studies were reviewed:  Myoview  06/19/2016: There was no ST segment deviation noted during stress. The study is normal. This is a low risk study. The left ventricular ejection fraction is mildly decreased (45-54%).   Normal pharmacologic nuclear stress test with no evidence of prior infarct or ischemia.  Mildly impaired LVEF consistent with non-ischemic cardiomyopathy.  _______________  Cardiac MRI 03/02/2019: Impression: 1.  Mild LVE with diffuse hypokinesis EF 44% 2.   Evidence for ventricular non compaction 3.  Mild LAE 4.  Mild MR 5.  Mild AR 6.  No delayed hyper-enhancement post gadolinium _______________  Monitor 12/09/2023 to 12/22/2023: Patient had a min HR of 61 bpm, max HR of 211 bpm, and avg HR of 80 bpm. Predominant underlying rhythm was Sinus Rhythm. 2 Ventricular Tachycardia runs occurred, the run with the fastest interval lasting 9 beats with a max rate of 211 bpm, the longest  lasting 9 beats with an avg rate of 146 bpm. 3 Supraventricular Tachycardia runs occurred, the run with the fastest interval lasting 7  beats with a max rate of 150 bpm, the longest lasting 7 beats with an avg rate of 129 bpm. Isolated SVEs were rare  (<1.0%), SVE Couplets were rare (<1.0%), and SVE Triplets were rare (<1.0%). Isolated VEs were rare (<1.0%), VE Couplets were rare (<1.0%), and no VE Triplets were present.    Normal sinus rhythm, sinus tachycardia, occasional PAC, short runs of SVT, occasional PVC and couplet and 9 beats nonsustained ventricular tachycardia. _______________  Echocardiogram 03/03/2024: Impressions: 1. There are significant trabecualtions in the LV apex. Left ventricular  ejection fraction, by estimation, is 45 to 50%. Left ventricular ejection  fraction by 3D volume is 51 %. The left ventricle has mildly decreased  function. The left ventricle has no   regional wall motion abnormalities. There is mild concentric left  ventricular hypertrophy. Left ventricular diastolic parameters are  consistent with Grade I diastolic dysfunction (impaired relaxation).   2. Right ventricular systolic function is normal. The right ventricular  size is normal.   3. Left atrial size was mildly dilated.   4. The mitral valve is normal in structure. Mild mitral valve  regurgitation. No evidence of mitral stenosis.   5. The aortic valve is tricuspid. There is mild calcification of the  aortic valve. Aortic valve regurgitation is not visualized. Aortic valve  sclerosis/calcification is present, without any evidence of aortic  stenosis.   6.  The inferior vena cava is normal in size with greater than 50%  respiratory variability, suggesting right atrial pressure of 3 mmHg.   EKG:  EKG not ordered today.   Recent Labs: 06/22/2023: TSH 3.08 01/29/2024: ALT 17; BUN 15; Creatinine 0.9; Hemoglobin 10.9; Platelets 258; Potassium 4.2; Sodium 142  Recent Lipid Panel    Component Value Date/Time   CHOL 126 12/07/2023 0958   TRIG 78 12/07/2023 0958   HDL 53 12/07/2023 0958   CHOLHDL 2.4 12/07/2023 0958   CHOLHDL 3  06/08/2020 0925   VLDL 15.2 06/08/2020 0925   LDLCALC 57 12/07/2023 0958    Physical Exam:    Vital Signs: There were no vitals taken for this visit.    Wt Readings from Last 3 Encounters:  02/02/24 169 lb (76.7 kg)  12/07/23 169 lb (76.7 kg)  11/05/23 169 lb 12.8 oz (77 kg)     General: 68 y.o. female in no acute distress. HEENT: Normocephalic and atraumatic. Sclera clear.  Neck: Supple. No carotid bruits. No JVD. Heart: *** RRR. Distinct S1 and S2. No murmurs, gallops, or rubs.  Lungs: No increased work of breathing. Clear to ausculation bilaterally. No wheezes, rhonchi, or rales.  Abdomen: Soft, non-distended, and non-tender to palpation.  Extremities: No lower extremity edema.  Radial and distal pedal pulses 2+ and equal bilaterally. Skin: Warm and dry. Neuro: No focal deficits. Psych: Normal affect. Responds appropriately.   Assessment:    No diagnosis found.  Plan:     Disposition: Follow up in ***   Signed, Aline FORBES Door, PA-C  05/20/2024 9:38 AM    [facility]

## 2024-05-26 ENCOUNTER — Other Ambulatory Visit: Payer: Self-pay | Admitting: Family Medicine

## 2024-05-31 ENCOUNTER — Other Ambulatory Visit: Payer: Self-pay | Admitting: Cardiology

## 2024-05-31 DIAGNOSIS — I5032 Chronic diastolic (congestive) heart failure: Secondary | ICD-10-CM

## 2024-06-01 ENCOUNTER — Ambulatory Visit: Admitting: Student

## 2024-06-02 MED ORDER — FUROSEMIDE 20 MG PO TABS: ORAL_TABLET | ORAL | 1 refills | Status: DC

## 2024-06-04 ENCOUNTER — Other Ambulatory Visit: Payer: Self-pay | Admitting: Family Medicine

## 2024-06-04 DIAGNOSIS — E119 Type 2 diabetes mellitus without complications: Secondary | ICD-10-CM

## 2024-06-06 NOTE — Telephone Encounter (Signed)
 See below. Pt already aware that she needs appt for refills. Appt has not been scheduled. Rx denied.

## 2024-06-06 NOTE — Telephone Encounter (Signed)
  All Conversations: Medication Refill (Newest Message First)            JUDI JAFFE to Me     05/23/24  8:52 AM Ok I will & thank you have a bless day & be safe Me to JANELLY SWITALSKI     05/23/24  8:47 AM Good morning, Per our records, you are due for a follow-up with Dr. Watt. Please call the office to schedule an appointment at your earliest convenience.    Thank you, KDC  Last read by [name] JULIANNA Hamel at 8:51AM on 05/23/2024.   05/20/24  6:56 PM Interface, [employer] Out routed this conversation to [employer]

## 2024-06-11 ENCOUNTER — Other Ambulatory Visit: Payer: Self-pay | Admitting: Family Medicine

## 2024-06-11 DIAGNOSIS — E119 Type 2 diabetes mellitus without complications: Secondary | ICD-10-CM

## 2024-06-13 ENCOUNTER — Ambulatory Visit: Payer: Self-pay | Admitting: *Deleted

## 2024-06-13 ENCOUNTER — Other Ambulatory Visit: Payer: Self-pay | Admitting: Family Medicine

## 2024-06-13 ENCOUNTER — Other Ambulatory Visit: Payer: Self-pay | Admitting: Nurse Practitioner

## 2024-06-13 DIAGNOSIS — Z8673 Personal history of transient ischemic attack (TIA), and cerebral infarction without residual deficits: Secondary | ICD-10-CM

## 2024-06-13 DIAGNOSIS — I4819 Other persistent atrial fibrillation: Secondary | ICD-10-CM

## 2024-06-13 NOTE — Telephone Encounter (Signed)
 FYI Only or Action Required?: FYI only for provider: appointment scheduled on 12/1.  Patient was last seen in primary care on 11/11/2023 by Copland, Harlene BROCKS, MD.  Called Nurse Triage reporting Toe Pain.  Symptoms began several months ago.  Interventions attempted: Nothing.  Symptoms are: unchanged.  Triage Disposition: See PCP Within 2 Weeks  Patient/caregiver understands and will follow disposition?: Yes  Copied from CRM #[id number]. Topic: Clinical - Red Word Triage >> Jun 13, 2024 10:26 AM Victoria A wrote: [facility] that prompted transfer to Nurse Triage: Patient is having burning feeling in her left big toe for a few months. Reason for Disposition  [1] MILD pain (e.g., does not interfere with normal activities) AND [2] present > 7 days  Answer Assessment - Initial Assessment Questions 1. ONSET: When did the pain start?      Few months 2. LOCATION: Where is the pain located?   (e.g., around nail, entire toe, at foot joint)      Left great toe 3. PAIN: How bad is the pain?    (Scale 1-10; or mild, moderate, severe)     Burning , tingling- mild 4. APPEARANCE: What does the toe look like? (e.g., redness, swelling, bruising, pallor)     no 5. CAUSE: What do you think is causing the toe pain?     unsure 6. OTHER SYMPTOMS: Do you have any other symptoms? (e.g., leg pain, rash, fever, numbness)     Low grade temp- 100.1- 2 weeks ago  Protocols used: Toe Pain-A-AH

## 2024-06-13 NOTE — Telephone Encounter (Signed)
 See below. Pt already aware that she needs appt for refills. Appt has not been scheduled. Rx denied.      Me    06/06/24 11:49 AM Note  All Conversations: Medication Refill (Newest Message First)            Amanda Saunders to Me     05/23/24  8:52 AM Ok I will & thank you have a bless day & be safe Me to Amanda Saunders     05/23/24  8:47 AM Good morning, Per our records, you are due for a follow-up with Dr. Watt. Please call the office to schedule an appointment at your earliest convenience.    Thank you, KDC  Last read by [name] Amanda Saunders at 8:51AM on 05/23/2024.   05/20/24  6:56 PM Interface, [employer] Out routed this conversation to [employer]

## 2024-06-13 NOTE — Telephone Encounter (Signed)
 Eliquis  5mg  refill request received. Patient is 68 years old, weight-76.7kg, Crea-0.90 on 01/29/24, Diagnosis-Afib, and last seen by Damien Braver on 12/07/23. Dose is appropriate based on dosing criteria. Will send in refill to requested pharmacy.

## 2024-06-13 NOTE — Telephone Encounter (Signed)
 See below. Pt already aware that she needs appt for refills. Appt has not been scheduled. Rx denied.      Me    06/06/24 11:49 AM Note  All Conversations: Medication Refill (Newest Message First)            Amanda Saunders to Me     05/23/24  8:52 AM Ok I will & thank you have a bless day & be safe Me to Amanda Saunders     05/23/24  8:47 AM Good morning, Per our records, you are due for a follow-up with Dr. Watt. Please call the office to schedule an appointment at your earliest convenience.    Thank you, KDC  Last read by Amanda Saunders Amanda Saunders at 8:51AM on 05/23/2024.   05/20/24  6:56 PM Interface, [employer] Out routed this conversation to [employer]

## 2024-06-14 ENCOUNTER — Other Ambulatory Visit: Payer: Self-pay | Admitting: Family Medicine

## 2024-06-14 ENCOUNTER — Encounter: Payer: Self-pay | Admitting: Family Medicine

## 2024-06-14 DIAGNOSIS — E119 Type 2 diabetes mellitus without complications: Secondary | ICD-10-CM

## 2024-06-14 MED ORDER — ROSUVASTATIN CALCIUM 20 MG PO TABS: 20.0000 mg | ORAL_TABLET | Freq: Every day | ORAL | 0 refills | Status: DC

## 2024-06-17 NOTE — Progress Notes (Deleted)
 [facility] [address] [place], [name] [address] [phone]  Date:  06/20/2024   Name:  Amanda Saunders   DOB:  [birth year]   MRN:  [id number]  PCP:  Watt Harlene BROCKS, MD    Chief Complaint: No chief complaint on file.   History of Present Illness:  Amanda Saunders is a 68 y.o. very pleasant female patient who presents with the following:  Pt seen today for an issue with her toe Last seen by me in April  History of DM, HTN. CVA, A fib/ eliquis , lupus   Lab Results  Component Value Date   HGBA1C 6.2 11/05/2023   Eye exam A1c can be updated Flu shot  Urine micro can be updated   Discussed the use of AI scribe software for clinical note transcription with the patient, who gave verbal consent to proceed.  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   Systemic lupus erythematosus (HCC) 08/04/2019   Acute ischemic stroke (HCC) 08/14/2018   HTN (hypertension) 08/14/2018   Controlled type 2 diabetes mellitus without complication, without long-term current use of insulin  (HCC) 04/03/2016   History of CVA (cerebrovascular accident) 03/12/2016   Chest pain at rest 03/12/2016   Peripheral edema 03/12/2016    Past Medical History:  Diagnosis Date   Anemia    Aortic atherosclerosis    Arthritis    Atrial fibrillation (HCC)    Cardiomyopathy (HCC)    CHF (congestive heart failure) (HCC)    Diabetes mellitus without complication (HCC)    Dyspnea    Dysrhythmia    GERD (gastroesophageal reflux disease)    Headache    Heart murmur    at birth    Hypertension    Internal hemorrhoids    Lupus    PUD (peptic ulcer disease)    Stroke (HCC) 08/13/2018   Vertigo     Past Surgical History:  Procedure Laterality Date   BACK SURGERY     CARDIOVERSION N/A 07/03/2022   Procedure: CARDIOVERSION;  Surgeon: Kate Lonni CROME, MD;  Location: [facility] ENDOSCOPY;  Service: Cardiovascular;  Laterality: N/A;   CARPAL TUNNEL RELEASE Bilateral    COLONOSCOPY     IRRIGATION AND DEBRIDEMENT KNEE Left 03/04/2023   Procedure: Left knee irrigation and debridement, wound closure;  Surgeon: Fidel Rogue, MD;  Location: [facility] ORS;  Service: Orthopedics;  Laterality: Left;   KNEE ARTHROPLASTY Left 12/17/2022   Procedure: COMPUTER ASSISTED TOTAL KNEE ARTHROPLASTY;  Surgeon: Fidel Rogue, MD;  Location: [facility] ORS;  Service: Orthopedics;  Laterality: Left;   KNEE CARTILAGE SURGERY Left    MENISECTOMY Left    NECK SURGERY     2015   UPPER GASTROINTESTINAL ENDOSCOPY      Social History   Tobacco Use   Smoking status: Former    Current packs/day: 0.00    Types: Cigarettes    Quit date: 07/21/2002    Years since quitting: 21.9   Smokeless tobacco: Never  Vaping Use   Vaping status: Never Used  Substance Use Topics   Alcohol  use: Not Currently    Comment: occasional  Drug use: No    Family History  Problem Relation Age of Onset   Hypertension Mother    Arthritis Mother    Heart failure Mother    Stroke Mother    Kidney disease Father    Hypertension Father    Colon cancer Neg Hx    Esophageal cancer Neg Hx    Rectal cancer Neg Hx    Stomach cancer Neg Hx     Allergies  Allergen Reactions   Pholcodine Nausea Only   Codeine Nausea And Vomiting    ALL FORMS   Oxycodone  Nausea And Vomiting    Medication list has been reviewed and updated.  Current Outpatient Medications on File Prior to Visit  Medication Sig Dispense Refill   acetaminophen  (TYLENOL ) 650 MG CR tablet Take 1,300 mg by mouth every 8 (eight) hours as needed for pain.      apixaban  (ELIQUIS ) 5 MG TABS tablet Take 1 tablet by mouth twice daily 180 tablet 1   Cholecalciferol (VITAMIN D -3) 125 MCG (5000 UT) TABS Take 5,000 Units by mouth daily.     CINNAMON PO Take 500 mg by mouth daily.     furosemide  (LASIX ) 20 MG tablet TAKE 1 TABLET BY MOUTH ONCE DAILY AND MAY TAKE AN EXTRA TABLET AS NEEDED FOR SHORTNESS OF BREATH OR WEIGHT GAIN 180 tablet 1   gabapentin  (NEURONTIN ) 800 MG tablet TAKE 1 TABLET BY MOUTH TWICE DAILY 180 tablet 1   hydroxychloroquine  (PLAQUENIL ) 200 MG tablet Take 200 mg by mouth 2 (two) times daily.     losartan  (COZAAR ) 50 MG tablet Take 1 tablet (50 mg total) by mouth every morning. Needs appt 30 tablet 0   Magnesium 250 MG TABS Take 250 mg by mouth daily.     metFORMIN  (GLUCOPHAGE ) 500 MG tablet TAKE 1 TABLET BY MOUTH EVERY DAY AT BEDTIME 30 tablet 10   metFORMIN  (GLUCOPHAGE -XR) 500 MG 24 hr tablet TAKE 1 TABLET BY MOUTH AT BEDTIME 30 tablet 0   metoprolol  succinate (TOPROL -XL) 50 MG 24 hr tablet TAKE 1 TABLET BY MOUTH ONCE DAILY WITH OR IMMEDIATELY FOLLOWING A MEAL 90 tablet 0   omeprazole  (PRILOSEC) 40 MG capsule Take 1 capsule (40 mg total) by mouth daily. 90 capsule 0   ondansetron  (ZOFRAN ) 4 MG tablet TAKE 1 TABLET BY MOUTH EVERY 8 HOURS AS NEEDED 30 tablet 0   rosuvastatin  (CRESTOR ) 20 MG tablet Take 1 tablet (20 mg total) by mouth daily. 90 tablet 0   Semaglutide , 1 MG/DOSE, (OZEMPIC , 1 MG/DOSE,) 4 MG/3ML SOPN Inject 1 mg into the skin once a week. 3 mL 1   sucralfate  (CARAFATE ) 1 g tablet Take 1 tablet (1 g total) by mouth 4 (four) times daily -  with meals and at bedtime. As needed 40 tablet 1   traMADol  (ULTRAM ) 50 MG tablet Take 1 tablet (50 mg total) by mouth every 6 (six) hours as needed for moderate pain. 30 tablet 0   Vitamin A 2400 MCG (8000 UT) CAPS Take 2,400 Units by mouth daily.     No current facility-administered medications on file prior to visit.    Review of Systems:  As per HPI- otherwise negative.   Physical  Examination: There were no vitals filed for this visit. There were no vitals filed for this visit. There is no height or weight on file to calculate BMI. Ideal Body Weight:    GEN: no acute distress. HEENT: Atraumatic, Normocephalic.  Ears and Nose: No external deformity. CV: RRR, No  M/G/R. No JVD. No thrill. No extra heart sounds. PULM: CTA B, no wheezes, crackles, rhonchi. No retractions. No resp. distress. No accessory muscle use. ABD: S, NT, ND, +BS. No rebound. No HSM. EXTR: No c/c/e PSYCH: Normally interactive. Conversant.    Assessment and Plan: No diagnosis found.  Assessment & Plan   Signed Harlene Schroeder, MD

## 2024-06-20 ENCOUNTER — Other Ambulatory Visit: Payer: Self-pay

## 2024-06-20 ENCOUNTER — Ambulatory Visit: Payer: Self-pay

## 2024-06-20 ENCOUNTER — Ambulatory Visit: Admitting: Family Medicine

## 2024-06-20 NOTE — Telephone Encounter (Signed)
 Copied from CRM [id number]. Topic: Clinical - Red Word Triage >> Jun 20, 2024 10:58 AM Joesph NOVAK wrote: Red Word that prompted transfer to Nurse Triage: toe burning and tingling, recently triaged.. needs to r/s.   Pt. Calling to change appointment for tingling toe. Declines to be triaged again. Appointment made.

## 2024-06-21 NOTE — Progress Notes (Unsigned)
 [facility] [address] [place], [name] [address] [phone]  Date:  06/20/2024   Name:  Amanda Saunders   DOB:  1956-05-09   MRN:  [id number]  PCP:  Watt Harlene BROCKS, MD    Chief Complaint: No chief complaint on file.   History of Present Illness:  Amanda Saunders is a 68 y.o. very pleasant female patient who presents with the following:  Pt seen today for an issue with her toe Last seen by me in April  History of DM, HTN. CVA, A fib/ eliquis , lupus   Lab Results  Component Value Date   HGBA1C 6.2 11/05/2023   Eye exam- she is able to do this and is due A1c can be updated Flu shot - she declines today  Urine micro can be updated   Discussed the use of AI scribe software for clinical note transcription with the patient, who gave verbal consent to proceed.  History of Present Illness Amanda Saunders is a 68 year old female with diabetes who presents with left big toe pain and tingling.  She has been experiencing burning, throbbing, and tingling sensations in her left big toe for the past two months. The symptoms are constant, occurring daily, and are primarily located on the medial aspect of the toe. No recent injury to the area is reported.  She is currently taking gabapentin  for headaches but has not experienced neuropathy symptoms like this before. Her current medications include metformin  500 mg once daily at night, omeprazole  daily for her stomach, metoprolol , and Ozempic  1 mg. She is no longer taking losartan .  She inquires about testing for sickle cell trait, as she recalls being told she had the trait as a child along with her sister.  She mentions experiencing memory issues, such as forgetting why she entered a room or where she placed items, which she feels is happening more frequently. Her family has noticed her short-term memory issues, but she can recall long-term memories without  difficulty.      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   Systemic lupus erythematosus (HCC) 08/04/2019   Acute ischemic stroke (HCC) 08/14/2018   HTN (hypertension) 08/14/2018   Controlled type 2 diabetes mellitus without complication, without long-term current use of insulin  (HCC) 04/03/2016   History of CVA (cerebrovascular accident) 03/12/2016   Chest pain at rest 03/12/2016   Peripheral edema 03/12/2016    Past Medical History:  Diagnosis Date   Anemia    Aortic atherosclerosis    Arthritis    Atrial fibrillation (HCC)    Cardiomyopathy (HCC)    CHF (congestive heart failure) (HCC)    Diabetes mellitus without complication (HCC)    Dyspnea    Dysrhythmia    GERD (gastroesophageal reflux disease)    Headache    Heart murmur    at birth   Hypertension    Internal hemorrhoids    Lupus    PUD (peptic ulcer disease)    Stroke (HCC) 08/13/2018   Vertigo     Past Surgical History:  Procedure Laterality Date   BACK SURGERY     CARDIOVERSION N/A 07/03/2022  Procedure: CARDIOVERSION;  Surgeon: Kate Lonni CROME, MD;  Location: [facility] ENDOSCOPY;  Service: Cardiovascular;  Laterality: N/A;   CARPAL TUNNEL RELEASE Bilateral    COLONOSCOPY     IRRIGATION AND DEBRIDEMENT KNEE Left 03/04/2023   Procedure: Left knee irrigation and debridement, wound closure;  Surgeon: Fidel Rogue, MD;  Location: [facility] ORS;  Service: Orthopedics;  Laterality: Left;   KNEE ARTHROPLASTY Left 12/17/2022   Procedure: COMPUTER ASSISTED TOTAL KNEE ARTHROPLASTY;  Surgeon: Fidel Rogue, MD;  Location: [facility] ORS;  Service: Orthopedics;  Laterality: Left;   KNEE CARTILAGE SURGERY Left     MENISECTOMY Left    NECK SURGERY     2015   UPPER GASTROINTESTINAL ENDOSCOPY      Social History   Tobacco Use   Smoking status: Former    Current packs/day: 0.00    Types: Cigarettes    Quit date: 07/21/2002    Years since quitting: 21.9   Smokeless tobacco: Never  Vaping Use   Vaping status: Never Used  Substance Use Topics   Alcohol  use: Not Currently    Comment: occasional   Drug use: No    Family History  Problem Relation Age of Onset   Hypertension Mother    Arthritis Mother    Heart failure Mother    Stroke Mother    Kidney disease Father    Hypertension Father    Colon cancer Neg Hx    Esophageal cancer Neg Hx    Rectal cancer Neg Hx    Stomach cancer Neg Hx     Allergies  Allergen Reactions   Pholcodine Nausea Only   Codeine Nausea And Vomiting    ALL FORMS   Oxycodone  Nausea And Vomiting    Medication list has been reviewed and updated.  Current Outpatient Medications on File Prior to Visit  Medication Sig Dispense Refill   acetaminophen  (TYLENOL ) 650 MG CR tablet Take 1,300 mg by mouth every 8 (eight) hours as needed for pain.     apixaban  (ELIQUIS ) 5 MG TABS tablet Take 1 tablet by mouth twice daily 180 tablet 1   Cholecalciferol (VITAMIN D -3) 125 MCG (5000 UT) TABS Take 5,000 Units by mouth daily.     CINNAMON PO Take 500 mg by mouth daily.     furosemide  (LASIX ) 20 MG tablet TAKE 1 TABLET BY MOUTH ONCE DAILY AND MAY TAKE AN EXTRA TABLET AS NEEDED FOR SHORTNESS OF BREATH OR WEIGHT GAIN 180 tablet 1   gabapentin  (NEURONTIN ) 800 MG tablet TAKE 1 TABLET BY MOUTH TWICE DAILY 180 tablet 1   hydroxychloroquine  (PLAQUENIL ) 200 MG tablet Take 200 mg by mouth 2 (two) times daily.     losartan  (COZAAR ) 50 MG tablet Take 1 tablet (50 mg total) by mouth every morning. Needs appt 30 tablet 0   Magnesium 250 MG TABS Take 250 mg by mouth daily.     metFORMIN  (GLUCOPHAGE ) 500 MG tablet TAKE 1 TABLET BY MOUTH EVERY DAY AT BEDTIME 30 tablet 10   metFORMIN   (GLUCOPHAGE -XR) 500 MG 24 hr tablet TAKE 1 TABLET BY MOUTH AT BEDTIME 30 tablet 0   metoprolol  succinate (TOPROL -XL) 50 MG 24 hr tablet TAKE 1 TABLET BY MOUTH ONCE DAILY WITH OR IMMEDIATELY FOLLOWING A MEAL 90 tablet 0   omeprazole  (PRILOSEC) 40 MG capsule Take 1 capsule (40 mg total) by mouth daily. 90 capsule 0   ondansetron  (ZOFRAN ) 4 MG tablet TAKE 1 TABLET BY MOUTH EVERY 8 HOURS AS NEEDED 30 tablet 0   rosuvastatin  (CRESTOR ) 20  MG tablet Take 1 tablet (20 mg total) by mouth daily. 90 tablet 0   Semaglutide , 1 MG/DOSE, (OZEMPIC , 1 MG/DOSE,) 4 MG/3ML SOPN Inject 1 mg into the skin once a week. 3 mL 1   sucralfate  (CARAFATE ) 1 g tablet Take 1 tablet (1 g total) by mouth 4 (four) times daily -  with meals and at bedtime. As needed 40 tablet 1   traMADol  (ULTRAM ) 50 MG tablet Take 1 tablet (50 mg total) by mouth every 6 (six) hours as needed for moderate pain. 30 tablet 0   Vitamin A 2400 MCG (8000 UT) CAPS Take 2,400 Units by mouth daily.     No current facility-administered medications on file prior to visit.    Review of Systems:  As per HPI- otherwise negative.   Physical Examination: Weight 176 pounds Blood pressure 126/80, pulse 75, oxygen saturation 98%.  Temperature 98.1 GEN: no acute distress. HEENT: Atraumatic, Normocephalic.  Ears and Nose: No external deformity. CV: RRR, No M/G/R. No JVD. No thrill. No extra heart sounds. PULM: CTA B, no wheezes, crackles, rhonchi. No retractions. No resp. distress. No accessory muscle use. ABD: S, NT, ND, +BS. No rebound. No HSM. EXTR: No c/c/e PSYCH: Normally interactive. Conversant.  Patient has strong pulses in both feet.  Feet are warm and well-perfused She does have evidence of neuropathy on monofilament testing, she is not able to sense filament at the left great toe and has decreased sensation in the right toes  Assessment and Plan: No diagnosis found.  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 Good diabetes control. Differential includes diabetic neuropathy, B12, folate, or iron deficiency. - Ordered blood tests for B12, folate, and iron levels. - Continue current gabapentin  dosage unless symptoms worsen. - Monitor diabetes control.  Memory change Reports short-term memory issues. Possible B12 deficiency or thyroid  issues. - Ordered blood tests for B12 and thyroid  function.  Essential hypertension Blood pressure well-controlled at 126/80 mmHg.  Iron deficiency anemia Potential contributing factor to neuropathy and memory issues. - Ordered blood tests to assess iron levels.  General health maintenance Declined flu shot. No recent eye exam. - Encouraged scheduling an eye exam. - Discussed flu shot; she declined.   Signed Harlene Schroeder, MD

## 2024-06-23 ENCOUNTER — Ambulatory Visit: Admitting: Family Medicine

## 2024-06-23 ENCOUNTER — Encounter: Payer: Self-pay | Admitting: Family Medicine

## 2024-06-23 ENCOUNTER — Telehealth: Payer: Self-pay | Admitting: Family Medicine

## 2024-06-23 VITALS — BP 126/80 | HR 75 | Temp 98.1°F | Ht 63.0 in | Wt 176.4 lb

## 2024-06-23 DIAGNOSIS — D509 Iron deficiency anemia, unspecified: Secondary | ICD-10-CM

## 2024-06-23 DIAGNOSIS — E1140 Type 2 diabetes mellitus with diabetic neuropathy, unspecified: Secondary | ICD-10-CM

## 2024-06-23 DIAGNOSIS — R413 Other amnesia: Secondary | ICD-10-CM

## 2024-06-23 DIAGNOSIS — R1013 Epigastric pain: Secondary | ICD-10-CM

## 2024-06-23 DIAGNOSIS — E119 Type 2 diabetes mellitus without complications: Secondary | ICD-10-CM

## 2024-06-23 DIAGNOSIS — Z8673 Personal history of transient ischemic attack (TIA), and cerebral infarction without residual deficits: Secondary | ICD-10-CM

## 2024-06-23 DIAGNOSIS — I10 Essential (primary) hypertension: Secondary | ICD-10-CM

## 2024-06-23 LAB — MICROALBUMIN / CREATININE URINE RATIO
Creatinine,U: 152.8 mg/dL
Microalb Creat Ratio: 23.5 mg/g (ref 0.0–30.0)
Microalb, Ur: 3.6 mg/dL — ABNORMAL HIGH (ref 0.0–1.9)

## 2024-06-23 MED ORDER — METOPROLOL SUCCINATE ER 50 MG PO TB24: 50.0000 mg | ORAL_TABLET | Freq: Every day | ORAL | 3 refills | Status: DC

## 2024-06-23 MED ORDER — OMEPRAZOLE 40 MG PO CPDR: 40.0000 mg | DELAYED_RELEASE_CAPSULE | Freq: Every day | ORAL | 0 refills | Status: DC

## 2024-06-23 MED ORDER — METFORMIN HCL ER 500 MG PO TB24: 500.0000 mg | ORAL_TABLET | Freq: Every day | ORAL | 3 refills | Status: DC

## 2024-06-23 MED ORDER — OZEMPIC (1 MG/DOSE) 4 MG/3ML ~~LOC~~ SOPN: 1.0000 mg | PEN_INJECTOR | SUBCUTANEOUS | 3 refills | Status: DC

## 2024-06-23 MED ORDER — ROSUVASTATIN CALCIUM 20 MG PO TABS: 20.0000 mg | ORAL_TABLET | Freq: Every day | ORAL | 3 refills | Status: DC

## 2024-06-23 NOTE — Patient Instructions (Signed)
 Good to see you today- I am going to look for any other explanation for your neuropathy besides just diabetes  Will be in touch asap!

## 2024-06-24 LAB — SICKLE CELL SCREEN: Sickle Solubility Test - HGBRFX: NEGATIVE

## 2024-06-24 NOTE — Telephone Encounter (Signed)
 N/a

## 2024-06-24 NOTE — Addendum Note (Signed)
 Addended by: ESTELLE GILLIS D on: 06/24/2024 04:38 PM   Modules accepted: Orders

## 2024-06-25 ENCOUNTER — Encounter: Payer: Self-pay | Admitting: Family Medicine

## 2024-06-28 ENCOUNTER — Encounter: Payer: Self-pay | Admitting: Pharmacist

## 2024-06-28 ENCOUNTER — Other Ambulatory Visit

## 2024-06-28 NOTE — Progress Notes (Signed)
 Pharmacy Quality Measure Review  This patient is appearing on a report for being at risk of failing the adherence measure for hypertension (ACEi/ARB) medications this calendar year.   Medication: losartan  Last fill date: 04/04/2024 for 30 day supply  Reviewed records - patient was noted to have stopped loartan at last office visit with Dr Watt. Removed from her med list at that time.   BP Readings from Last 3 Encounters:  06/23/24 126/80  12/07/23 130/62  11/11/23 124/80    No additional action needed at this time.   Madelin Ray, PharmD Clinical Pharmacist [facility]  Population Health [phone]

## 2024-07-05 ENCOUNTER — Other Ambulatory Visit

## 2024-07-07 ENCOUNTER — Other Ambulatory Visit (INDEPENDENT_AMBULATORY_CARE_PROVIDER_SITE_OTHER)

## 2024-07-07 DIAGNOSIS — R413 Other amnesia: Secondary | ICD-10-CM | POA: Diagnosis not present

## 2024-07-07 DIAGNOSIS — D509 Iron deficiency anemia, unspecified: Secondary | ICD-10-CM

## 2024-07-07 DIAGNOSIS — E1140 Type 2 diabetes mellitus with diabetic neuropathy, unspecified: Secondary | ICD-10-CM | POA: Diagnosis not present

## 2024-07-07 DIAGNOSIS — E119 Type 2 diabetes mellitus without complications: Secondary | ICD-10-CM | POA: Diagnosis not present

## 2024-07-07 LAB — CBC
HCT: 34.3 % — ABNORMAL LOW (ref 36.0–46.0)
Hemoglobin: 11.1 g/dL — ABNORMAL LOW (ref 12.0–15.0)
MCHC: 32.4 g/dL (ref 30.0–36.0)
MCV: 76.9 fl — ABNORMAL LOW (ref 78.0–100.0)
Platelets: 196 K/uL (ref 150.0–400.0)
RBC: 4.47 Mil/uL (ref 3.87–5.11)
RDW: 15.8 % — ABNORMAL HIGH (ref 11.5–15.5)
WBC: 4.9 K/uL (ref 4.0–10.5)

## 2024-07-07 LAB — BASIC METABOLIC PANEL WITH GFR
BUN: 14 mg/dL (ref 6–23)
CO2: 30 meq/L (ref 19–32)
Calcium: 8.9 mg/dL (ref 8.4–10.5)
Chloride: 102 meq/L (ref 96–112)
Creatinine, Ser: 0.81 mg/dL (ref 0.40–1.20)
GFR: 75.18 mL/min (ref >60.00)
Glucose, Bld: 87 mg/dL (ref 70–99)
Potassium: 4.7 meq/L (ref 3.5–5.1)
Sodium: 140 meq/L (ref 135–145)

## 2024-07-07 LAB — HEMOGLOBIN A1C: Hgb A1c MFr Bld: 6.2 % (ref 4.6–6.5)

## 2024-07-07 LAB — FOLATE: Folate: 16 ng/mL (ref >5.9)

## 2024-07-07 LAB — TSH: TSH: 3.1 u[IU]/mL (ref 0.35–5.50)

## 2024-07-07 LAB — FERRITIN: Ferritin: 10.8 ng/mL (ref 10.0–291.0)

## 2024-07-07 LAB — VITAMIN B12: Vitamin B-12: 576 pg/mL (ref 211–911)

## 2024-07-12 NOTE — Progress Notes (Deleted)
 "  NEUROLOGY FOLLOW UP OFFICE NOTE  GENNETTE SHADIX [id number]  Assessment/Plan:    Nonspecific age-related dizziness Bilateral occipital neuralgia stable Right MCA infarcts secondary to right MCA stenosis/occlusion Hypertension Hyperlipidemia   Gabapentin  800mg  twice daily Management of stroke risk factors as per PCP and cardiology: Eliquis  Statin.   LDL goal less than 70 Hgb J8r goal less than 7 Normotensive blood pressure Follow up 6 months.     Subjective:  Athziry Millican is a 68 year old left-handed woman with  SLE, a fib, HTN and history of stroke who follows up for headache and lightheadedness.  CTA personally reviewed.   UPDATE: Headaches: ***  Lightheadedness: When last seen in January 2025, she endorsed lightheadedness.  CTA head and neck on 09/09/2023 demonstrated unchanged chronic occlusion of the right MCA distal M1 segment with some areas of stenosis of the distal branches throughout the right MCA territory but no basilar stenosis or other vascular findings to explain lightheadedness.  Cardiac workup by cardiology, including echo and heart monitor, were overall unremarkable.     Current NSAIDs:  ASA 81mg , Motrin  Current analgesic:  Tylenol , Tramadol  Current anti-emetic:  Zofran  4mg  Current muscle relaxant:  none Current antihypertensive:  HCTZ, losartan , Toprol  XL Current antiepileptic:  gabapentin  800mg  twice daily Current antihistamine:  Meclizine  Other medication:  Eliquis , Plaquenil     HISTORY: Headache: On 03/03/19, she developed a sudden severe pounding right sided headache (radiating across forehead) with right facial burning.  There was associated nausea, sometimes vomiting, photophobia and phonophobia.  No visual disturbance.  She felt that her equilibrium was off.  She went to the ED on 03/11/19 for further evaluation. CT head personally reviewed and negative for acute intracranial abnormality such as hemorrhage or infarct.  CBC and BMP were  unremarkable.  She was given a headache cocktail of Reglan  and Benadryl  and discharged home.  Headaches are still present but not as severe.  She reported temperature of 99.  They lessened on 8/24.  Headache is mild to moderate, lasting a couple of hours off and on, daily.  Sometimes she feels shaking in her head.  Reports a little bit of neck pain.     04/17/2019 MRI w wo and MRA of head: advanced widespread chronic small vessel ischemic changes as well as occlusion of right distal M1 segment and severe stenoses of left M2 branches, A2 branches and mild irregularity of the PCA branches, but no acute intracranial abnormality. 04/13/2019 Sed Rate 11.  Past medication:  tramadol , naproxen , Robaxin    History of CVA: She was admitted to [facility] on 08/14/18 for increased left arm numbness and weakness with left sided tremor as well as headache, dizziness with nausea and vomiting.  CT of head was personally reviewed and showed no acute findings.  MRI of brain personally reviewed and demonstrated scattered acute watershed subcortical and small cortical infarcts in the right MCA/ACA and MCA/PCA areas.  MRA of head personally reviewed showed severe stenosis or short segment occlusion of the distal right MCA M1 and proximal M2 segments as well as proximal severe stenosis or short segment occlusion of left MCA M2 segment.  Carotid doppler showed no hemodynamically significant stenosis.  2D echocardiogram showed EF 45-50%.  LDL was 77.  Hgb A1c was 6.2.  ASA 81mg  daily was switched to ASA 325mg  and Plavix  75mg  daily for 3 months with plan to subsequently continue Plavix  alone.  She was continued on atorvastatin  40mg  daily.       PAST MEDICAL  HISTORY: Past Medical History:  Diagnosis Date   Anemia    Aortic atherosclerosis    Arthritis    Atrial fibrillation (HCC)    Cardiomyopathy (HCC)    CHF (congestive heart failure) (HCC)    Diabetes mellitus without complication (HCC)    Dyspnea     Dysrhythmia    GERD (gastroesophageal reflux disease)    Headache    Heart murmur    at birth   Hypertension    Internal hemorrhoids    Lupus    PUD (peptic ulcer disease)    Stroke (HCC) 08/13/2018   Vertigo     MEDICATIONS: Current Outpatient Medications on File Prior to Visit  Medication Sig Dispense Refill   acetaminophen  (TYLENOL ) 650 MG CR tablet Take 1,300 mg by mouth every 8 (eight) hours as needed for pain.     apixaban  (ELIQUIS ) 5 MG TABS tablet Take 1 tablet by mouth twice daily 180 tablet 1   Cholecalciferol (VITAMIN D -3) 125 MCG (5000 UT) TABS Take 5,000 Units by mouth daily.     CINNAMON PO Take 500 mg by mouth daily.     furosemide  (LASIX ) 20 MG tablet TAKE 1 TABLET BY MOUTH ONCE DAILY AND MAY TAKE AN EXTRA TABLET AS NEEDED FOR SHORTNESS OF BREATH OR WEIGHT GAIN 180 tablet 1   gabapentin  (NEURONTIN ) 800 MG tablet TAKE 1 TABLET BY MOUTH TWICE DAILY 180 tablet 1   hydroxychloroquine  (PLAQUENIL ) 200 MG tablet Take 200 mg by mouth 2 (two) times daily.     Magnesium 250 MG TABS Take 250 mg by mouth daily.     metFORMIN  (GLUCOPHAGE -XR) 500 MG 24 hr tablet Take 1 tablet (500 mg total) by mouth at bedtime. 90 tablet 3   metoprolol  succinate (TOPROL -XL) 50 MG 24 hr tablet Take 1 tablet (50 mg total) by mouth daily. Take with or immediately following a meal. 90 tablet 3   omeprazole  (PRILOSEC) 40 MG capsule Take 1 capsule (40 mg total) by mouth daily. 90 capsule 0   ondansetron  (ZOFRAN ) 4 MG tablet TAKE 1 TABLET BY MOUTH EVERY 8 HOURS AS NEEDED 30 tablet 0   rosuvastatin  (CRESTOR ) 20 MG tablet Take 1 tablet (20 mg total) by mouth daily. 90 tablet 3   Semaglutide , 1 MG/DOSE, (OZEMPIC , 1 MG/DOSE,) 4 MG/3ML SOPN Inject 1 mg into the skin once a week. 9 mL 3   sucralfate  (CARAFATE ) 1 g tablet Take 1 tablet (1 g total) by mouth 4 (four) times daily -  with meals and at bedtime. As needed 40 tablet 1   traMADol  (ULTRAM ) 50 MG tablet Take 1 tablet (50 mg total) by mouth every 6 (six)  hours as needed for moderate pain. 30 tablet 0   Vitamin A 2400 MCG (8000 UT) CAPS Take 2,400 Units by mouth daily.     No current facility-administered medications on file prior to visit.    ALLERGIES: Allergies  Allergen Reactions   Pholcodine Nausea Only   Codeine Nausea And Vomiting    ALL FORMS   Oxycodone  Nausea And Vomiting    FAMILY HISTORY: Family History  Problem Relation Age of Onset   Hypertension Mother    Arthritis Mother    Heart failure Mother    Stroke Mother    Kidney disease Father    Hypertension Father    Colon cancer Neg Hx    Esophageal cancer Neg Hx    Rectal cancer Neg Hx    Stomach cancer Neg Hx  Objective:  *** General: No acute distress.  Patient appears well-groomed.   Head:  Normocephalic/atraumatic Neck:  Supple.  No paraspinal tenderness.  Full range of motion. Heart:  Regular rate and rhythm. Neuro:  Alert and oriented.  Speech fluent and not dysarthric.  Language intact.  CN II-XII intact.  Bulk and tone normal.  Muscle strength 5/5 throughout.  Sensation to light touch intact.  Deep tendon reflexes 2+ throughout, toes downgoing.  Gait normal.  Romberg negative. ***    Juliene Dunnings, DO  CC: Harlene Schroeder, MD       "

## 2024-07-13 ENCOUNTER — Encounter: Payer: Self-pay | Admitting: Neurology

## 2024-07-13 ENCOUNTER — Ambulatory Visit: Admitting: Neurology

## 2024-07-13 DIAGNOSIS — Z029 Encounter for administrative examinations, unspecified: Secondary | ICD-10-CM

## 2024-08-07 NOTE — Progress Notes (Unsigned)
 No show

## 2024-08-08 ENCOUNTER — Telehealth: Payer: Self-pay | Admitting: Family Medicine

## 2024-08-08 NOTE — Telephone Encounter (Signed)
 Reced a fax from Dr. Delmar office to submit a referralto  [employer] for pt.   Although a referral was not placed by Dr. Watt I still did the auth based off the DX and provider they gave me   You have successfully created and submitted a referral. [id number] Your referral has been successfully created for Amanda Saunders for 12 visits from August 08, 2024 to February 04, 2025. It may take up to 48 hours for the referral request to become available online.You can print a copy for your records if you wish

## 2024-08-10 ENCOUNTER — Ambulatory Visit: Admitting: Family Medicine

## 2024-08-10 DIAGNOSIS — M329 Systemic lupus erythematosus, unspecified: Secondary | ICD-10-CM

## 2024-08-10 DIAGNOSIS — E119 Type 2 diabetes mellitus without complications: Secondary | ICD-10-CM

## 2024-08-10 DIAGNOSIS — D509 Iron deficiency anemia, unspecified: Secondary | ICD-10-CM

## 2024-08-10 DIAGNOSIS — I10 Essential (primary) hypertension: Secondary | ICD-10-CM

## 2024-08-10 DIAGNOSIS — Z91199 Patient's noncompliance with other medical treatment and regimen due to unspecified reason: Secondary | ICD-10-CM

## 2024-08-18 ENCOUNTER — Other Ambulatory Visit: Payer: Self-pay

## 2024-08-18 DIAGNOSIS — Z8673 Personal history of transient ischemic attack (TIA), and cerebral infarction without residual deficits: Secondary | ICD-10-CM

## 2024-08-18 DIAGNOSIS — I4819 Other persistent atrial fibrillation: Secondary | ICD-10-CM

## 2024-08-18 DIAGNOSIS — I5032 Chronic diastolic (congestive) heart failure: Secondary | ICD-10-CM

## 2024-08-19 ENCOUNTER — Other Ambulatory Visit: Payer: Self-pay

## 2024-08-19 ENCOUNTER — Telehealth: Payer: Self-pay | Admitting: Family Medicine

## 2024-08-19 ENCOUNTER — Telehealth: Payer: Self-pay | Admitting: Cardiology

## 2024-08-19 DIAGNOSIS — R1013 Epigastric pain: Secondary | ICD-10-CM

## 2024-08-19 DIAGNOSIS — I4819 Other persistent atrial fibrillation: Secondary | ICD-10-CM

## 2024-08-19 DIAGNOSIS — Z8673 Personal history of transient ischemic attack (TIA), and cerebral infarction without residual deficits: Secondary | ICD-10-CM

## 2024-08-19 DIAGNOSIS — E119 Type 2 diabetes mellitus without complications: Secondary | ICD-10-CM

## 2024-08-19 DIAGNOSIS — I10 Essential (primary) hypertension: Secondary | ICD-10-CM

## 2024-08-19 DIAGNOSIS — R110 Nausea: Secondary | ICD-10-CM

## 2024-08-19 DIAGNOSIS — I5032 Chronic diastolic (congestive) heart failure: Secondary | ICD-10-CM

## 2024-08-19 DIAGNOSIS — E1140 Type 2 diabetes mellitus with diabetic neuropathy, unspecified: Secondary | ICD-10-CM

## 2024-08-19 MED ORDER — METOPROLOL SUCCINATE ER 50 MG PO TB24: 50.0000 mg | ORAL_TABLET | Freq: Every day | ORAL | 3 refills | Status: AC

## 2024-08-19 MED ORDER — SUCRALFATE 1 G PO TABS: 1.0000 g | ORAL_TABLET | Freq: Three times a day (TID) | ORAL | 1 refills | Status: AC

## 2024-08-19 MED ORDER — ONDANSETRON HCL 4 MG PO TABS: 4.0000 mg | ORAL_TABLET | Freq: Three times a day (TID) | ORAL | 0 refills | Status: AC | PRN

## 2024-08-19 MED ORDER — METFORMIN HCL ER 500 MG PO TB24: 500.0000 mg | ORAL_TABLET | Freq: Every day | ORAL | 3 refills | Status: AC

## 2024-08-19 MED ORDER — OZEMPIC (1 MG/DOSE) 4 MG/3ML ~~LOC~~ SOPN: 1.0000 mg | PEN_INJECTOR | SUBCUTANEOUS | 3 refills | Status: AC

## 2024-08-19 MED ORDER — OMEPRAZOLE 40 MG PO CPDR: 40.0000 mg | DELAYED_RELEASE_CAPSULE | Freq: Every day | ORAL | 3 refills | Status: AC

## 2024-08-19 MED ORDER — ROSUVASTATIN CALCIUM 20 MG PO TABS: 20.0000 mg | ORAL_TABLET | Freq: Every day | ORAL | 3 refills | Status: AC

## 2024-08-19 NOTE — Telephone Encounter (Signed)
 Copied from CRM #[id number]. Topic: General - Other >> Aug 19, 2024  9:47 AM Maisie BROCKS wrote: Reason for CRM: Hadassah from [facility] stated they sent a fax to the office on 1/28 requesting for all the patient's medications to be sent to them. Callback # is [phone], Fax: [phone] ([facility] in [place] ).   She asked for someone to call her and confirm if it was received. Please call and advise.

## 2024-08-19 NOTE — Telephone Encounter (Signed)
 Rxs were sent earlier today

## 2024-08-19 NOTE — Telephone Encounter (Signed)
" °*  STAT* If patient is at the pharmacy, call can be transferred to refill team.   1. Which medications need to be refilled? (please list name of each medication and dose if known) furosemide  (LASIX ) 20 MG tablet  apixaban  (ELIQUIS ) 5 MG TABS tablet   2. Would you like to learn more about the convenience, safety, & potential cost savings by using the [facility]? No     3. Are you open to using the [facility] (Type [facility]. No    4. Which pharmacy/location (including street and city if local pharmacy) is medication to be sent to? [facility] - [place], [place] [address]        5. Do they need a 30 day or 90 day supply? 90 day   Pt will now use [facility].  "

## 2024-08-21 NOTE — Progress Notes (Unsigned)
 [facility] [address] [place], [name] [address] [phone]  Date:  08/24/2024   Name:  Amanda Saunders   DOB:  [birth year]   MRN:  [id number]  PCP:  Watt Harlene BROCKS, MD    Chief Complaint: No chief complaint on file.   History of Present Illness:  Amanda Saunders is a 69 y.o. very pleasant female patient who presents with the following:  Patient seen today for a physical and also would like a Pap smear.  I saw her most recently in December.  A1c at that time showed good control of diabetes History of DM, HTN. CVA, A fib/ eliquis , lupus  She saw her rheumatologist most recently in October  Eye exam Foot exam Recommend flu shot Lab Results  Component Value Date   HGBA1C 6.2 07/07/2024   Eliquis  5 twice daily Lasix  20 mg daily, can take an extra tablet daily if needed Gabapentin  800 twice daily Plaquenil  200 twice daily Metformin  XR 500 daily Toprol  XL 50 Prilosec Crestor  20 Ozempic  1 mg  Mammogram completed 5/25 Colonoscopy completed 5/23 Bone density 11/23-osteopenia, need update Pap 2021, normal with negative HPV.  Can do a final Pap smear today  We did notice likely iron deficiency anemia in December and I requested that she start an over-the-counter iron supplement.  We can recheck her CBC and ferritin today  Discussed the use of AI scribe software for clinical note transcription with the patient, who gave verbal consent to proceed.  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   Systemic lupus erythematosus (HCC) 08/04/2019   Acute ischemic  stroke (HCC) 08/14/2018   HTN (hypertension) 08/14/2018   Controlled type 2 diabetes mellitus without complication, without long-term current use of insulin  (HCC) 04/03/2016   History of CVA (cerebrovascular accident) 03/12/2016   Chest pain at rest 03/12/2016   Peripheral edema 03/12/2016    Past Medical History:  Diagnosis Date   Anemia    Aortic atherosclerosis    Arthritis    Atrial fibrillation (HCC)    Cardiomyopathy (HCC)    CHF (congestive heart failure) (HCC)    Diabetes mellitus without complication (HCC)    Dyspnea    Dysrhythmia    GERD (gastroesophageal reflux disease)    Headache    Heart murmur    at birth   Hypertension    Internal hemorrhoids    Lupus    PUD (peptic ulcer disease)    Stroke (HCC) 08/13/2018   Vertigo     Past Surgical History:  Procedure Laterality Date   BACK SURGERY     CARDIOVERSION N/A 07/03/2022   Procedure: CARDIOVERSION;  Surgeon: Kate Lonni CROME, MD;  Location: [facility] ENDOSCOPY;  Service: Cardiovascular;  Laterality: N/A;   CARPAL TUNNEL RELEASE Bilateral    COLONOSCOPY     IRRIGATION AND DEBRIDEMENT KNEE Left 03/04/2023   Procedure: Left knee irrigation and debridement, wound closure;  Surgeon: Fidel Rogue, MD;  Location: [facility] ORS;  Service: Orthopedics;  Laterality: Left;   KNEE ARTHROPLASTY Left 12/17/2022   Procedure: COMPUTER ASSISTED TOTAL KNEE ARTHROPLASTY;  Surgeon: Fidel Rogue,  MD;  Location: [facility] ORS;  Service: Orthopedics;  Laterality: Left;   KNEE CARTILAGE SURGERY Left    MENISECTOMY Left    NECK SURGERY     2015   UPPER GASTROINTESTINAL ENDOSCOPY      Social History[1]  Family History  Problem Relation Age of Onset   Hypertension Mother    Arthritis Mother    Heart failure Mother    Stroke Mother    Kidney disease Father    Hypertension Father    Colon cancer Neg Hx    Esophageal cancer Neg Hx    Rectal cancer Neg Hx    Stomach cancer Neg Hx     Allergies[2]  Medication list has been  reviewed and updated.  Medications Ordered Prior to Encounter[3]  Review of Systems:  As per HPI- otherwise negative.   Physical Examination: There were no vitals filed for this visit. There were no vitals filed for this visit. There is no height or weight on file to calculate BMI. Ideal Body Weight:    GEN: no acute distress. HEENT: Atraumatic, Normocephalic.  Ears and Nose: No external deformity. CV: RRR, No M/G/R. No JVD. No thrill. No extra heart sounds. PULM: CTA B, no wheezes, crackles, rhonchi. No retractions. No resp. distress. No accessory muscle use. ABD: S, NT, ND, +BS. No rebound. No HSM. EXTR: No c/c/e PSYCH: Normally interactive. Conversant.    Assessment and Plan: No diagnosis found.  Assessment & Plan   Signed Harlene Schroeder, MD    [1]  Social History Tobacco Use   Smoking status: Former    Current packs/day: 0.00    Types: Cigarettes    Quit date: 07/21/2002    Years since quitting: 22.1   Smokeless tobacco: Never  Vaping Use   Vaping status: Never Used  Substance Use Topics   Alcohol  use: Not Currently    Comment: occasional   Drug use: No  [2]  Allergies Allergen Reactions   Pholcodine Nausea Only   Codeine Nausea And Vomiting    ALL FORMS   Oxycodone  Nausea And Vomiting  [3]  Current Outpatient Medications on File Prior to Visit  Medication Sig Dispense Refill   acetaminophen  (TYLENOL ) 650 MG CR tablet Take 1,300 mg by mouth every 8 (eight) hours as needed for pain.     apixaban  (ELIQUIS ) 5 MG TABS tablet Take 1 tablet by mouth twice daily 180 tablet 1   Cholecalciferol (VITAMIN D -3) 125 MCG (5000 UT) TABS Take 5,000 Units by mouth daily.     CINNAMON PO Take 500 mg by mouth daily.     furosemide  (LASIX ) 20 MG tablet TAKE 1 TABLET BY MOUTH ONCE DAILY AND MAY TAKE AN EXTRA TABLET AS NEEDED FOR SHORTNESS OF BREATH OR WEIGHT GAIN 180 tablet 1   gabapentin  (NEURONTIN ) 800 MG tablet TAKE 1 TABLET BY MOUTH TWICE DAILY 180 tablet 1    hydroxychloroquine  (PLAQUENIL ) 200 MG tablet Take 200 mg by mouth 2 (two) times daily.     Magnesium 250 MG TABS Take 250 mg by mouth daily.     metFORMIN  (GLUCOPHAGE -XR) 500 MG 24 hr tablet Take 1 tablet (500 mg total) by mouth at bedtime. 90 tablet 3   metoprolol  succinate (TOPROL -XL) 50 MG 24 hr tablet Take 1 tablet (50 mg total) by mouth daily. Take with or immediately following a meal. 90 tablet 3   omeprazole  (PRILOSEC) 40 MG capsule Take 1 capsule (40 mg total) by mouth daily. 90 capsule 3   ondansetron  (ZOFRAN ) 4  MG tablet Take 1 tablet (4 mg total) by mouth every 8 (eight) hours as needed. 30 tablet 0   rosuvastatin  (CRESTOR ) 20 MG tablet Take 1 tablet (20 mg total) by mouth daily. 90 tablet 3   Semaglutide , 1 MG/DOSE, (OZEMPIC , 1 MG/DOSE,) 4 MG/3ML SOPN Inject 1 mg into the skin once a week. 9 mL 3   sucralfate  (CARAFATE ) 1 g tablet Take 1 tablet (1 g total) by mouth 4 (four) times daily -  with meals and at bedtime. As needed 40 tablet 1   traMADol  (ULTRAM ) 50 MG tablet Take 1 tablet (50 mg total) by mouth every 6 (six) hours as needed for moderate pain. 30 tablet 0   Vitamin A 2400 MCG (8000 UT) CAPS Take 2,400 Units by mouth daily.     No current facility-administered medications on file prior to visit.   "

## 2024-08-22 MED ORDER — FUROSEMIDE 20 MG PO TABS: ORAL_TABLET | ORAL | 1 refills | Status: AC

## 2024-08-22 MED ORDER — APIXABAN 5 MG PO TABS: 5.0000 mg | ORAL_TABLET | Freq: Two times a day (BID) | ORAL | 1 refills | Status: AC

## 2024-08-24 ENCOUNTER — Encounter: Payer: Self-pay | Admitting: Family Medicine

## 2024-08-24 ENCOUNTER — Ambulatory Visit: Admitting: Family Medicine

## 2024-08-24 VITALS — BP 130/82 | HR 73 | Ht 63.0 in | Wt 173.2 lb

## 2024-08-24 DIAGNOSIS — R300 Dysuria: Secondary | ICD-10-CM

## 2024-08-24 DIAGNOSIS — Z0000 Encounter for general adult medical examination without abnormal findings: Secondary | ICD-10-CM

## 2024-08-24 DIAGNOSIS — M8580 Other specified disorders of bone density and structure, unspecified site: Secondary | ICD-10-CM

## 2024-08-24 DIAGNOSIS — D509 Iron deficiency anemia, unspecified: Secondary | ICD-10-CM

## 2024-08-24 DIAGNOSIS — Z124 Encounter for screening for malignant neoplasm of cervix: Secondary | ICD-10-CM

## 2024-08-24 DIAGNOSIS — G8929 Other chronic pain: Secondary | ICD-10-CM

## 2024-08-24 DIAGNOSIS — E611 Iron deficiency: Secondary | ICD-10-CM

## 2024-08-24 LAB — CBC
HCT: 36.7 % (ref 36.0–46.0)
Hemoglobin: 12 g/dL (ref 12.0–15.0)
MCHC: 32.7 g/dL (ref 30.0–36.0)
MCV: 75.8 fl — ABNORMAL LOW (ref 78.0–100.0)
Platelets: 201 10*3/uL (ref 150.0–400.0)
RBC: 4.84 Mil/uL (ref 3.87–5.11)
RDW: 17.8 % — ABNORMAL HIGH (ref 11.5–15.5)
WBC: 4.3 10*3/uL (ref 4.0–10.5)

## 2024-08-24 LAB — POCT URINALYSIS DIP (MANUAL ENTRY)
Blood, UA: NEGATIVE
Glucose, UA: NEGATIVE mg/dL
Ketones, POC UA: NEGATIVE mg/dL
Nitrite, UA: NEGATIVE
Protein Ur, POC: 30 mg/dL — AB
Spec Grav, UA: 1.015
Urobilinogen, UA: 0.2 U/dL
pH, UA: 6.5

## 2024-08-24 LAB — FERRITIN: Ferritin: 11.8 ng/mL (ref 10.0–291.0)

## 2024-08-24 MED ORDER — TRAMADOL HCL 50 MG PO TABS: 50.0000 mg | ORAL_TABLET | Freq: Four times a day (QID) | ORAL | 0 refills | Status: AC | PRN

## 2024-08-24 MED ORDER — NITROFURANTOIN MONOHYD MACRO 100 MG PO CAPS: 100.0000 mg | ORAL_CAPSULE | Freq: Two times a day (BID) | ORAL | 0 refills | Status: AC

## 2024-08-24 NOTE — Patient Instructions (Addendum)
 Good to see you today- I will be in touch with your labs Bone density scan ordered- can be set up here at [facility] at your convenience Please see me in 6 months  I refilled your tramadol  as well   Macrobid  for possible UTI- take twice daily while we wait on your Urine culture

## 2024-08-24 NOTE — Progress Notes (Unsigned)
 "  Cardiology Office Note:    Date:  08/24/2024   ID:  Amanda Saunders, DOB 05/09/1956, MRN [id number]  PCP:  Watt Harlene BROCKS, MD  Cardiologist:  Redell Shallow, MD { Click to update primary MD,subspecialty MD or APP then REFRESH:1}    Referring MD: Copland, Harlene BROCKS, MD   Chief Complaint: follow-up of chest pain, shortness of breath, and palpitations   History of Present Illness:    Amanda Saunders is a 69 y.o. 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 Shallow and presents today for follow-up of chest pain, shortness of breath, and palpitations.   Patient was initially referred to Dr. Shallow in 2017 for chest pain and dyspnea. ETT and Echo were ordered for further evaluation. ETT was non-diagnostic. Echo showed LVEF of 40-45% with diffuse hypokinesis, grade 1 diastolic dysfunction, mild MR, and mild TR. Also showed possible signs of noncompaction at the LV apex. Nuclear stress test and cardiac MRI were then ordered. Myoview  showed in 05/2016 showed no evidence of prior infarct or ischemia. Cardiac MRI in 05/2016 showed prominent mid to apical LV trabeculation concerning for LV noncompaction but no myocardial LGE to suggest prior MI, myocarditis, or infiltrative disease. She had CVA in 07/2018. Carotid dopplers at that time showed mild stenosis (1-39%) of bilateral ICAs. Repeat cardiac MRI in 02/2019 showed mild LV enlargement with diffuse hypokinesis, EF 44%, with evidence of ventricular noncompaction. She was subsequently diagnosed with atrial fibrillation in 05/2022 and underwent successful DCCV in 06/2022 with restoration of sinus rhythm. Echo at that time showed LVEF of 60-65%.  She was last seen by Damien Braver, NP, in 11/2023 at which time she reported episodes of  feeling overwhelmed with associated diaphoresis and shakiness as well as a feeling of her heart pounding. She will also have some associated shortness of breath and chest tightness with this. These episodes occurred 1-2 times per week and would last <10 minutes. She also reported intermittent fleeting chest discomfort that she describes as a shock that would only last for a second at a time and then resolve spontaneously. 2 week Zio monitor and Echo were ordered for further evaluation. Coronary CTA was offered but patient has significant claustrophobia and so decision was made to hold off on this. Monitor showed 2 short runs of NSVT (longest run lasting 9 beats), 2 runs of SVT (longest run 7 beats), and rare PACs/ PVCs but no significant arrhthymias. Echo showed LVEF of 45-50% with no regional wall motion abnormalities, mild concentric LVH, grade 1 diastolic dysfunction, and significant trabeculations in the LV apex as well as normal RV function, mild MR, and no significant valvular disease.   Patient presents today for follow-up. ***  Chest Pain  Non-Compaction Cardiomyopathy Chronic HFmrEF Prior cardiac MRI in 02/2019 confirmed ventricular non-compaction. Last Echo in 02/2024 showed LVEF of 45-50% with no regional wall motion abnormalities, mild concentric LVH, grade 1 diastolic dysfunction, and significant trabeculations in the LV apex as well as normal RV function, mild MR, and no significant valvular disease.  - *** - Continue Lasix  20mg  daily. Can take an extra dose as needed for weight gain or edema.  - Continue Losartan  50mg  daily.  - Continue Toprol -XL 50mg  daily.  - Discussed importance of daily weights and sodium restrictions.  Palpitations Paroxysmal Atrial Fibrillation Diagnosed in  05/2022 and underwent successful DCCV in 06/2022. She reported palpitations that she described as pounding at last visit in 11/2023. Monitor showed a couple of short runs both NSVT and SVT and rare PACs/ PVCs  but no significant arrhythmias.  - Maintaining sinus rhythm on exam. *** - Continue Toprol -XL 50mg  daily.  - Continue Eliquis  5mg  twice daily.   Mild Mitral Regurgitation Noted on Echo in 02/2024.  - Can continue routine monitor with serial Echos per guidelines.   Hypertension BP well controlled. *** - Will treat in context of CHF as above.  Hyperlipidemia Lipid panel in 11/2023: Total Cholesterol 126, Triglycerides 78, HDL 53, LDL 57.  - Continue Crestor  20mg  daily.   Type 2 Diabetes Mellitus Hemoglobin A1c 6.2% in 10/2023.  - On Metformin  and Ozempic .  - Management per PCP.  History of CVA History of CVA in 07/2018. Felt to possibly be secondary to intracranial vascular disease vs non-compaction. Subsequently diagnosed with paroxysmal atrial fibrillation in 2023 as well.  - Continue Eliquis  and Crestor .   EKGs/Labs/Other Studies Reviewed:    The following studies were reviewed:  Myoview  06/19/2016: There was no ST segment deviation noted during stress. The study is normal. This is a low risk study. The left ventricular ejection fraction is mildly decreased (45-54%).   Normal pharmacologic nuclear stress test with no evidence of prior infarct or ischemia.  Mildly impaired LVEF consistent with non-ischemic cardiomyopathy.  _______________   Cardiac MRI 03/02/2019: Impression: 1.  Mild LVE with diffuse hypokinesis EF 44% 2.   Evidence for ventricular non compaction 3.  Mild LAE 4.  Mild MR 5.  Mild AR 6.  No delayed hyper-enhancement post gadolinium _______________   Monitor 12/09/2023 to 12/22/2023: Patient had a min HR of 61 bpm, max HR of 211 bpm, and avg HR of 80 bpm. Predominant underlying rhythm was Sinus Rhythm. 2 Ventricular Tachycardia runs occurred, the run with the fastest interval lasting 9 beats with a max rate of 211 bpm, the longest  lasting 9 beats with an avg rate of 146 bpm. 3 Supraventricular Tachycardia runs occurred, the run with the fastest interval  lasting 7 beats with a max rate of 150 bpm, the longest lasting 7 beats with an avg rate of 129 bpm. Isolated SVEs were rare  (<1.0%), SVE Couplets were rare (<1.0%), and SVE Triplets were rare (<1.0%). Isolated VEs were rare (<1.0%), VE Couplets were rare (<1.0%), and no VE Triplets were present.    Normal sinus rhythm, sinus tachycardia, occasional PAC, short runs of SVT, occasional PVC and couplet and 9 beats nonsustained ventricular tachycardia. _______________   Echocardiogram 03/03/2024: Impressions: 1. There are significant trabecualtions in the LV apex. Left ventricular  ejection fraction, by estimation, is 45 to 50%. Left ventricular ejection  fraction by 3D volume is 51 %. The left ventricle has mildly decreased  function. The left ventricle has no   regional wall motion abnormalities. There is mild concentric left  ventricular hypertrophy. Left ventricular diastolic parameters are  consistent with Grade I diastolic dysfunction (impaired relaxation).   2. Right ventricular systolic function is normal. The right ventricular  size is normal.   3. Left atrial size was mildly dilated.   4. The mitral valve is normal in structure. Mild mitral valve  regurgitation. No evidence of mitral stenosis.   5. The aortic valve is tricuspid. There is mild calcification of the  aortic valve. Aortic valve regurgitation is not visualized. Aortic valve  sclerosis/calcification is present, without any evidence of  aortic  stenosis.   6. The inferior vena cava is normal in size with greater than 50%  respiratory variability, suggesting right atrial pressure of 3 mmHg.   EKG:  EKG not ordered today.  Recent Labs: 01/29/2024: ALT 17 07/07/2024: BUN 14; Creatinine, Ser 0.81; Hemoglobin 11.1; Platelets 196.0; Potassium 4.7; Sodium 140; TSH 3.10  Recent Lipid Panel    Component Value Date/Time   CHOL 126 12/07/2023 0958   TRIG 78 12/07/2023 0958   HDL 53 12/07/2023 0958   CHOLHDL 2.4 12/07/2023  0958   CHOLHDL 3 06/08/2020 0925   VLDL 15.2 06/08/2020 0925   LDLCALC 57 12/07/2023 0958    Physical Exam:    Vital Signs: There were no vitals taken for this visit.    Wt Readings from Last 3 Encounters:  08/24/24 173 lb 3.2 oz (78.6 kg)  06/23/24 176 lb 6.4 oz (80 kg)  02/02/24 169 lb (76.7 kg)     General: 69 y.o. female in no acute distress. HEENT: Normocephalic and atraumatic. Sclera clear.  Neck: Supple. No carotid bruits. No JVD. Heart: *** RRR. Distinct S1 and S2. No murmurs, gallops, or rubs.  Lungs: No increased work of breathing. Clear to ausculation bilaterally. No wheezes, rhonchi, or rales.  Abdomen: Soft, non-distended, and non-tender to palpation.  Extremities: No lower extremity edema.  Radial and distal pedal pulses 2+ and equal bilaterally. Skin: Warm and dry. Neuro: No focal deficits. Psych: Normal affect. Responds appropriately.   Assessment:    No diagnosis found.  Plan:     Disposition: Follow up in ***   Signed, Zephaniah Enyeart E Risha Barretta, PA-C  08/24/2024 2:34 PM     [facility] "

## 2024-08-25 ENCOUNTER — Encounter: Payer: Self-pay | Admitting: Family Medicine

## 2024-08-25 ENCOUNTER — Other Ambulatory Visit: Payer: Self-pay

## 2024-08-25 LAB — URINE CULTURE
MICRO NUMBER:: 17547956
Result:: NO GROWTH
SPECIMEN QUALITY:: ADEQUATE

## 2024-08-25 LAB — CYTOLOGY - PAP
Adequacy: ABSENT
Comment: NEGATIVE
Diagnosis: NEGATIVE
High risk HPV: NEGATIVE

## 2024-08-25 MED ORDER — GABAPENTIN 800 MG PO TABS: ORAL_TABLET | ORAL | 1 refills | Status: AC

## 2024-08-25 NOTE — Telephone Encounter (Signed)
 Transfer of pharmacy request received from [facility]   Spoke to patient appt will be schedule she is due for Follow up.  RX sent to [facility].

## 2024-08-26 ENCOUNTER — Encounter: Payer: Self-pay | Admitting: Family Medicine

## 2024-09-01 ENCOUNTER — Ambulatory Visit: Admitting: Student

## 2024-09-05 ENCOUNTER — Ambulatory Visit: Payer: Self-pay | Admitting: Neurology

## 2025-02-07 ENCOUNTER — Ambulatory Visit
